# Patient Record
Sex: Female | Born: 1960 | Race: White | Hispanic: No | Marital: Married | Smoking: Former smoker
Health system: Southern US, Community
[De-identification: ages and names within clinical notes are randomized; demographics above are authoritative.]

## PROBLEM LIST (undated history)

## (undated) DIAGNOSIS — Z87442 Personal history of urinary calculi: Secondary | ICD-10-CM

## (undated) DIAGNOSIS — K829 Disease of gallbladder, unspecified: Secondary | ICD-10-CM

## (undated) DIAGNOSIS — M549 Dorsalgia, unspecified: Secondary | ICD-10-CM

## (undated) DIAGNOSIS — M2550 Pain in unspecified joint: Secondary | ICD-10-CM

## (undated) DIAGNOSIS — F1990 Other psychoactive substance use, unspecified, uncomplicated: Secondary | ICD-10-CM

## (undated) DIAGNOSIS — R079 Chest pain, unspecified: Secondary | ICD-10-CM

## (undated) DIAGNOSIS — F319 Bipolar disorder, unspecified: Secondary | ICD-10-CM

## (undated) DIAGNOSIS — E119 Type 2 diabetes mellitus without complications: Secondary | ICD-10-CM

## (undated) DIAGNOSIS — N951 Menopausal and female climacteric states: Secondary | ICD-10-CM

## (undated) DIAGNOSIS — E1142 Type 2 diabetes mellitus with diabetic polyneuropathy: Secondary | ICD-10-CM

## (undated) DIAGNOSIS — E785 Hyperlipidemia, unspecified: Secondary | ICD-10-CM

## (undated) DIAGNOSIS — K760 Fatty (change of) liver, not elsewhere classified: Secondary | ICD-10-CM

## (undated) DIAGNOSIS — J45909 Unspecified asthma, uncomplicated: Secondary | ICD-10-CM

## (undated) DIAGNOSIS — F32A Depression, unspecified: Secondary | ICD-10-CM

## (undated) DIAGNOSIS — G629 Polyneuropathy, unspecified: Secondary | ICD-10-CM

## (undated) DIAGNOSIS — R0600 Dyspnea, unspecified: Secondary | ICD-10-CM

## (undated) DIAGNOSIS — F329 Major depressive disorder, single episode, unspecified: Secondary | ICD-10-CM

## (undated) DIAGNOSIS — N979 Female infertility, unspecified: Secondary | ICD-10-CM

## (undated) DIAGNOSIS — I10 Essential (primary) hypertension: Secondary | ICD-10-CM

## (undated) DIAGNOSIS — T7840XA Allergy, unspecified, initial encounter: Secondary | ICD-10-CM

## (undated) DIAGNOSIS — E282 Polycystic ovarian syndrome: Secondary | ICD-10-CM

## (undated) DIAGNOSIS — J189 Pneumonia, unspecified organism: Secondary | ICD-10-CM

## (undated) DIAGNOSIS — M5430 Sciatica, unspecified side: Secondary | ICD-10-CM

## (undated) DIAGNOSIS — K219 Gastro-esophageal reflux disease without esophagitis: Secondary | ICD-10-CM

## (undated) HISTORY — DX: Sciatica, unspecified side: M54.30

## (undated) HISTORY — DX: Menopausal and female climacteric states: N95.1

## (undated) HISTORY — DX: Dyspnea, unspecified: R06.00

## (undated) HISTORY — DX: Polycystic ovarian syndrome: E28.2

## (undated) HISTORY — DX: Pain in unspecified joint: M25.50

## (undated) HISTORY — DX: Unspecified asthma, uncomplicated: J45.909

## (undated) HISTORY — DX: Dorsalgia, unspecified: M54.9

## (undated) HISTORY — DX: Female infertility, unspecified: N97.9

## (undated) HISTORY — DX: Disease of gallbladder, unspecified: K82.9

## (undated) HISTORY — DX: Essential (primary) hypertension: I10

## (undated) HISTORY — DX: Allergy, unspecified, initial encounter: T78.40XA

## (undated) HISTORY — DX: Bipolar disorder, unspecified: F31.9

## (undated) HISTORY — DX: Chest pain, unspecified: R07.9

## (undated) HISTORY — DX: Polyneuropathy, unspecified: G62.9

## (undated) HISTORY — PX: EYE SURGERY: SHX253

## (undated) HISTORY — PX: WISDOM TOOTH EXTRACTION: SHX21

## (undated) HISTORY — DX: Hyperlipidemia, unspecified: E78.5

## (undated) HISTORY — PX: TONSILLECTOMY: SUR1361

## (undated) HISTORY — DX: Fatty (change of) liver, not elsewhere classified: K76.0

## (undated) HISTORY — DX: Other psychoactive substance use, unspecified, uncomplicated: F19.90

## (undated) HISTORY — DX: Type 2 diabetes mellitus without complications: E11.9

## (undated) HISTORY — DX: Type 2 diabetes mellitus with diabetic polyneuropathy: E11.42

## (undated) HISTORY — PX: DILATION AND CURETTAGE OF UTERUS: SHX78

---

## 1990-09-08 DIAGNOSIS — F419 Anxiety disorder, unspecified: Secondary | ICD-10-CM

## 1990-09-08 HISTORY — DX: Anxiety disorder, unspecified: F41.9

## 1994-09-08 HISTORY — PX: CHOLECYSTECTOMY: SHX55

## 1998-11-16 ENCOUNTER — Other Ambulatory Visit: Admission: RE | Admit: 1998-11-16 | Discharge: 1998-11-16 | Payer: Self-pay | Admitting: Obstetrics and Gynecology

## 1999-12-24 ENCOUNTER — Other Ambulatory Visit: Admission: RE | Admit: 1999-12-24 | Discharge: 1999-12-24 | Payer: Self-pay | Admitting: Obstetrics and Gynecology

## 2000-12-28 ENCOUNTER — Other Ambulatory Visit: Admission: RE | Admit: 2000-12-28 | Discharge: 2000-12-28 | Payer: Self-pay | Admitting: Obstetrics and Gynecology

## 2004-03-30 ENCOUNTER — Emergency Department (HOSPITAL_COMMUNITY): Admission: EM | Admit: 2004-03-30 | Discharge: 2004-03-31 | Payer: Self-pay | Admitting: Emergency Medicine

## 2004-03-31 ENCOUNTER — Inpatient Hospital Stay (HOSPITAL_COMMUNITY): Admission: EM | Admit: 2004-03-31 | Discharge: 2004-04-07 | Payer: Self-pay | Admitting: *Deleted

## 2004-05-12 ENCOUNTER — Emergency Department (HOSPITAL_COMMUNITY): Admission: EM | Admit: 2004-05-12 | Discharge: 2004-05-12 | Payer: Self-pay | Admitting: Emergency Medicine

## 2005-01-24 ENCOUNTER — Ambulatory Visit: Payer: Self-pay | Admitting: Internal Medicine

## 2005-02-05 ENCOUNTER — Ambulatory Visit: Payer: Self-pay | Admitting: Internal Medicine

## 2005-02-19 ENCOUNTER — Ambulatory Visit: Payer: Self-pay | Admitting: Hematology & Oncology

## 2005-05-28 ENCOUNTER — Ambulatory Visit (HOSPITAL_COMMUNITY): Admission: RE | Admit: 2005-05-28 | Discharge: 2005-05-28 | Payer: Self-pay | Admitting: Emergency Medicine

## 2005-09-08 DIAGNOSIS — M1990 Unspecified osteoarthritis, unspecified site: Secondary | ICD-10-CM

## 2005-09-08 HISTORY — DX: Unspecified osteoarthritis, unspecified site: M19.90

## 2006-09-08 HISTORY — PX: KNEE ARTHROSCOPY: SUR90

## 2009-01-17 ENCOUNTER — Observation Stay (HOSPITAL_COMMUNITY): Admission: AD | Admit: 2009-01-17 | Discharge: 2009-01-18 | Payer: Self-pay | Admitting: Orthopedic Surgery

## 2009-01-17 ENCOUNTER — Emergency Department (HOSPITAL_COMMUNITY): Admission: EM | Admit: 2009-01-17 | Discharge: 2009-01-17 | Payer: Self-pay | Admitting: Emergency Medicine

## 2009-01-18 ENCOUNTER — Emergency Department (HOSPITAL_COMMUNITY): Admission: EM | Admit: 2009-01-18 | Discharge: 2009-01-19 | Payer: Self-pay | Admitting: Emergency Medicine

## 2009-01-20 ENCOUNTER — Encounter: Admission: RE | Admit: 2009-01-20 | Discharge: 2009-01-20 | Payer: Self-pay | Admitting: Orthopedic Surgery

## 2010-04-01 ENCOUNTER — Encounter: Admission: RE | Admit: 2010-04-01 | Discharge: 2010-04-01 | Payer: Self-pay | Admitting: Family Medicine

## 2010-09-29 ENCOUNTER — Encounter: Payer: Self-pay | Admitting: Internal Medicine

## 2010-12-17 LAB — BASIC METABOLIC PANEL
BUN: 11 mg/dL (ref 6–23)
CO2: 27 mEq/L (ref 19–32)
Calcium: 8.6 mg/dL (ref 8.4–10.5)
Chloride: 106 mEq/L (ref 96–112)
Chloride: 106 mEq/L (ref 96–112)
Creatinine, Ser: 0.76 mg/dL (ref 0.4–1.2)
GFR calc Af Amer: >60 mL/min (ref >60)
GFR calc non Af Amer: >60 mL/min (ref >60)
GFR calc non Af Amer: >60 mL/min (ref >60)
Glucose, Bld: 169 mg/dL — ABNORMAL HIGH (ref 70–99)
Potassium: 3.7 mEq/L (ref 3.5–5.1)
Potassium: 3.9 mEq/L (ref 3.5–5.1)
Potassium: 4.2 mEq/L (ref 3.5–5.1)
Sodium: 142 mEq/L (ref 135–145)

## 2010-12-17 LAB — DIFFERENTIAL
Basophils Relative: 1 % (ref 0–1)
Basophils Relative: 1 % (ref 0–1)
Lymphocytes Relative: 18 % (ref 12–46)
Lymphs Abs: 2 10*3/uL (ref 0.7–4.0)
Monocytes Absolute: 0.6 10*3/uL (ref 0.1–1.0)
Monocytes Absolute: 0.7 10*3/uL (ref 0.1–1.0)
Monocytes Relative: 5 % (ref 3–12)
Monocytes Relative: 6 % (ref 3–12)
Neutro Abs: 8.5 10*3/uL — ABNORMAL HIGH (ref 1.7–7.7)
Neutro Abs: 9.2 10*3/uL — ABNORMAL HIGH (ref 1.7–7.7)
Neutrophils Relative %: 74 % (ref 43–77)

## 2010-12-17 LAB — GLUCOSE, CAPILLARY

## 2010-12-17 LAB — CBC
HCT: 34.8 % — ABNORMAL LOW (ref 36.0–46.0)
HCT: 37.5 % (ref 36.0–46.0)
Hemoglobin: 12 g/dL (ref 12.0–15.0)
Hemoglobin: 13 g/dL (ref 12.0–15.0)
MCHC: 34.8 g/dL (ref 30.0–36.0)
MCV: 85 fL (ref 78.0–100.0)
MCV: 85.9 fL (ref 78.0–100.0)
Platelets: 293 10*3/uL (ref 150–400)
RBC: 4.18 MIL/uL (ref 3.87–5.11)
RBC: 4.41 MIL/uL (ref 3.87–5.11)
RDW: 14.4 % (ref 11.5–15.5)
WBC: 11.4 10*3/uL — ABNORMAL HIGH (ref 4.0–10.5)
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
## 2011-01-24 NOTE — Discharge Summary (Signed)
NAMEJAMAYIA, Alicia Lucero NO.:  [id number]   MEDICAL RECORD NO.:  [id number]          PATIENT TYPE:  INP   LOCATION:  3016                         FACILITY:  [facility]   PHYSICIAN:  Eulas Post, MD    DATE OF BIRTH:  [birth year]   DATE OF ADMISSION:  01/17/2009  DATE OF DISCHARGE:  01/18/2009                               DISCHARGE SUMMARY   ADMISSION DIAGNOSES:  1. Right knee pain.  2. Right prepatellar laceration.   DISCHARGE DIAGNOSES:  1. Right knee pain.  2. Right prepatellar laceration.   HOSPITAL COURSE:  Ms. Alicia Lucero is a morbidly obese, diabetic woman  who fell directly onto her right knee.  She presented to the office, and  was in tears, desperate that she was failing to thrive at home and was  unable to care for herself.  She had severe knee pain and was unable to  ambulate.  She was begging for hospital admission in order to help allow  her to get to the bathroom and back, and assist with physical therapy  and IV pain control.  She was brought into the hospital and given IV  pain medicine as well as physical therapy and improved.  She worked with  physical therapy and was able to ambulate at the time of discharge.  She  is planning to be discharged home with follow-up with me in the office.  She benefited maximally from hospital stay and there were no  complications.      Eulas Post, MD  Electronically Signed     JPL/MEDQ  D:  03/07/2009  T:  03/08/2009  Job:  [id number]
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
## 2011-05-29 ENCOUNTER — Other Ambulatory Visit: Payer: Self-pay | Admitting: Family Medicine

## 2011-05-29 DIAGNOSIS — R270 Ataxia, unspecified: Secondary | ICD-10-CM

## 2011-05-29 DIAGNOSIS — R410 Disorientation, unspecified: Secondary | ICD-10-CM

## 2011-06-05 ENCOUNTER — Ambulatory Visit
Admission: RE | Admit: 2011-06-05 | Discharge: 2011-06-05 | Disposition: A | Discharge status: Home / Self Care | Payer: 59 | Source: Ambulatory Visit | Attending: Family Medicine | Admitting: Family Medicine

## 2011-06-05 DIAGNOSIS — R410 Disorientation, unspecified: Secondary | ICD-10-CM

## 2011-06-05 DIAGNOSIS — R270 Ataxia, unspecified: Secondary | ICD-10-CM

## 2011-06-10 ENCOUNTER — Ambulatory Visit: Payer: 59 | Admitting: *Deleted

## 2011-06-12 ENCOUNTER — Encounter: Payer: 59 | Attending: Family Medicine | Admitting: *Deleted

## 2011-06-12 DIAGNOSIS — E119 Type 2 diabetes mellitus without complications: Secondary | ICD-10-CM | POA: Insufficient documentation

## 2011-06-12 DIAGNOSIS — Z713 Dietary counseling and surveillance: Secondary | ICD-10-CM | POA: Insufficient documentation

## 2011-06-13 NOTE — Progress Notes (Signed)
  Medical Nutrition Therapy:  Appt start time: 1200 end time:  1300.   Assessment:  Primary concerns today: Diabetes self management and nutrition counseling for weight loss. Patient states she has been taught Carb Counting in past but is not currently following a meal plan, has a meter to check blood glucose which she currently does 3 times/day and is taking 30 units of Levemir in the evening in addition to Janumet. She states her BG averages in the low 200's and she is interested in reaching an initial goal of 150, then down to 120 as able. She has no medical method to correct high BG at this time.   MEDICATIONS: see list   DIETARY INTAKE:  Usual eating pattern includes 3 meals and 0 snacks per day.  Everyday foods include very limited food intake including lean meats, vegetables and protein bars.  Avoided foods include alcohol.    24-hr recall:  B ( AM): Balance Bar  Snk ( AM): none  L ( PM): Balance bar Snk ( PM): none D ( PM): lean meat, vegetables, occasional starch Snk ( PM): none Beverages: low sodium V-8 Juice, water  Usual physical activity: physical therapy 3 times per week for 1 hour  Estimated energy needs: 1400 calories 158 g carbohydrates 105 g protein 39 g fat  Progress Towards Goal(s):  In progress.   Nutritional Diagnosis:  NI-1.5 Excessive energy intake As related to uncontrolled diabetes.  As evidenced by HbA1c of 10%.    Intervention:  Nutrition counseling regarding consistent carb meal plan, averaging 3 Carb Choices per meal. Also described opportunity of adding analog/bolus insulin at meal time with a Carb Ratio and Sensitivity factor so she could adjust bolus insulin based on actual food intake and BG reading. This can prevent hypoglycemia as well as weight gain. In addition, it would give her an opportunity to correct high blood glucose readings and thus lower her A1c effectively. I have asked her to relay this to her MD for consideration and if approved,  let me know if he would prefer to adjust the bolus insulin or give me permission to do so while reporting her progress back to him regularly.  Handouts given during visit include:  Living Well with Diabetes  Carb Counting and Beyond  Monitoring/Evaluation:  Dietary intake of 1400 calories / day with consistent carb intake, exercise, continued self monitoring of BG, and body weight in 4 week(s).

## 2011-06-19 ENCOUNTER — Telehealth: Payer: Self-pay | Admitting: *Deleted

## 2011-07-01 ENCOUNTER — Encounter: Payer: Self-pay | Admitting: *Deleted

## 2011-07-01 ENCOUNTER — Encounter: Payer: 59 | Admitting: *Deleted

## 2011-07-01 NOTE — Progress Notes (Signed)
Introduction to Intensive Insulin Therapy:  Appt start time: 0930 end time:  1000.  Assessment:  This patient has DM 2 and her primary concerns today: are to start on intensive insulin therapy.  She has obtained Rx for Novolog Pen and understands rationale for taking this insulin for Carb intake and to correct high BGs. Order received from Dr. Milus Glazier to start intensive insulin therapy and for me to make adjustments to doses in order to achieve BG goals.  Current Insulin Dose: Basal Insulin: 30 units of Levemir at night via pen  Total of insulin doses per day 30 units Other diabetes medications: Janumet   Patient states they are testing 1-2 times per day Patient does not currently have Ketone Strips  Patient states knowledge of Carb Counting is good  Usual physical activity: physical therapy 3 times per week. Interested in swimming at the [facility]  Last A1c was 10% Patient states they have not forgotten to take their insulin injection any times per week Patient states they have had hypoglycemia no times in the past month Patient states their biggest barrier with diabetes is weight gain when on fixed doses of insulin in the past  Patient currently is on dis-ability and the schedule is flexible. She is planning to go back to work next Monday  Progress Towards Intensive Insulin Therapy goal(s):  In progress.  Patient states her expectations of intensive insulin therapy include: better BG management along with weight loss Patient expresses understanding that for improved outcomes for their diabetes on intensive insulin therapy they will:  Check BG 3 times per day pre meals.  Reduced Levemir from 30 to 20 units at night  Use Carb Ratio of 1 unit Novolog / 5 grams Carbohydrate at meals, none for snacks at this point  Use Correction Factor of 1 unit Novolog for every 25 mg/dl she is above target of 150 at pre-meal BG check  May adjust Novo log dose down by 2 units if planned increase in  activity before next meal.  Record BG, Carbs and insulin dose on Log Sheet provided today and fax or e-mail to me weekly.      Intervention:  Reviewed and patient expressed verbal understanding of plan for intensive insulin therapy above.   Handouts given during visit include:  Log sheet to record BG, food and insulin doses  Monitoring/Evaluation:   Log sheet to be emailed or faxed to me in one week.

## 2011-07-24 ENCOUNTER — Encounter: Payer: 59 | Attending: Family Medicine | Admitting: *Deleted

## 2011-07-24 DIAGNOSIS — Z713 Dietary counseling and surveillance: Secondary | ICD-10-CM | POA: Insufficient documentation

## 2011-07-24 DIAGNOSIS — E119 Type 2 diabetes mellitus without complications: Secondary | ICD-10-CM | POA: Insufficient documentation

## 2011-07-24 NOTE — Patient Instructions (Addendum)
  Continue with current intensive insulin regime of  Basal insulin 35 units Levemir at night and  Bolus Novolog @ 1 unit/every 10 grams Carbohydrate PLUS 1 unit for 3 every 35 mg/dl above target of 096.  New home rule, no eating out of the container, put food in dish or on paper towel for snacks  Measure out 3 c popcorn into favorite bowl to see perspective of 1 Carb Choice  Consider using 1/2 c measure as serving utensil for meals at home  Activity plan: work on swimming at [facility] in mornings or evenings, weekends  (may need to reduce Levemir the night before?)  Test BG pre and post swimming to get that data

## 2011-07-25 ENCOUNTER — Encounter: Payer: Self-pay | Admitting: *Deleted

## 2011-07-25 NOTE — Progress Notes (Signed)
Patient seen for follow up assessment of Intensive Insulin Regime and weight loss management.  Current insulin dose: Basal insulin 35 units Levemir at night and  Bolus Novolog @ 1 unit/every 10 grams Carbohydrate PLUS 1 unit for 3 every 35 mg/dl above target of 562  Medications: Current weight 339 lbs indicates weight gain of 7 pounds in past month  BG:  Averages are down to 14 day average of 160 mg/dl on current insulin regime and more consistent carb plan  Assessment:  Patient states she is happy with the ability to adjust insulin based on her actual food intake and to be able to correct any high BGs now. She is comfortable with math involved in making these bolus decisions and is doing quite well. She states she has been eating more the last week. She states she doesn't feel like eating during the day and then overeats in the evenings with uncontrolled portion sizes. Her activity level is based on appointments with her physical therapist which was just one appointment this week. She is interested in swimming the the [facility] as a way to increase her activity level,  just needs a plan to implement with her work schedule.  Plan: Continue with current intensive insulin regime of  Basal insulin 35 units Levemir at night and  Bolus Novolog @ 1 unit/every 10 grams Carbohydrate PLUS 1 unit for 3 every 35 mg/dl above target of 130.   New home rule; no eating out of the container, put food in dish or on paper towel for snacks  Measure out 3 c popcorn into favorite bowl to see perspective of 1 Carb Choice  Consider using 1/2 c measure as serving utensil for meals at home  Activity plan: work on swimming at [facility] in mornings or evenings, weekends  (may need to reduce Levemir the night before?)  Test BG pre and post swimming to get that data  Follow Up: Appointment scheduled for 2 weeks. Patient to communicate  exercise success and BG Logs via phone or e-mail in the meantime.

## 2011-08-07 ENCOUNTER — Ambulatory Visit: Payer: 59 | Admitting: *Deleted

## 2011-08-14 ENCOUNTER — Ambulatory Visit: Payer: 59 | Admitting: *Deleted

## 2011-08-21 ENCOUNTER — Ambulatory Visit: Payer: 59 | Admitting: *Deleted

## 2011-08-21 ENCOUNTER — Ambulatory Visit (INDEPENDENT_AMBULATORY_CARE_PROVIDER_SITE_OTHER): Payer: 59 | Admitting: Family Medicine

## 2011-08-21 DIAGNOSIS — R21 Rash and other nonspecific skin eruption: Secondary | ICD-10-CM

## 2011-08-21 DIAGNOSIS — E109 Type 1 diabetes mellitus without complications: Secondary | ICD-10-CM

## 2011-12-16 ENCOUNTER — Other Ambulatory Visit: Payer: Self-pay | Admitting: Family Medicine

## 2011-12-17 ENCOUNTER — Telehealth: Payer: Self-pay

## 2011-12-17 NOTE — Telephone Encounter (Signed)
[facility] pharmacist called bc we had OKd RF of pt's insulin yesterday, but pt states the amount was increased to 36-40 units. Checked pts chart and per Dr Cain Saupe notes, and Sarah's OK, called pharmacist back and changed prescription to 40 units QD, quant suf for 1 mos. Pharm agreed and will put note on Rx that pt needs OV for more

## 2012-01-29 ENCOUNTER — Ambulatory Visit (INDEPENDENT_AMBULATORY_CARE_PROVIDER_SITE_OTHER): Payer: 59 | Admitting: Family Medicine

## 2012-01-29 ENCOUNTER — Encounter: Payer: Self-pay | Admitting: Family Medicine

## 2012-01-29 VITALS — BP 115/74 | HR 69 | Temp 98.3°F | Resp 16 | Ht 64.0 in | Wt 341.6 lb

## 2012-01-29 DIAGNOSIS — F329 Major depressive disorder, single episode, unspecified: Secondary | ICD-10-CM

## 2012-01-29 DIAGNOSIS — M5126 Other intervertebral disc displacement, lumbar region: Secondary | ICD-10-CM

## 2012-01-29 DIAGNOSIS — M5432 Sciatica, left side: Secondary | ICD-10-CM

## 2012-01-29 DIAGNOSIS — F32A Depression, unspecified: Secondary | ICD-10-CM | POA: Insufficient documentation

## 2012-01-29 DIAGNOSIS — E109 Type 1 diabetes mellitus without complications: Secondary | ICD-10-CM

## 2012-01-29 LAB — LIPID PANEL
Cholesterol: 169 mg/dL (ref 0–200)
HDL: 49 mg/dL (ref >39)
LDL Cholesterol: 93 mg/dL (ref 0–99)
Total CHOL/HDL Ratio: 3.4 Ratio
Triglycerides: 136 mg/dL (ref <150)
VLDL: 27 mg/dL (ref 0–40)

## 2012-01-29 LAB — COMPREHENSIVE METABOLIC PANEL
ALT: 20 U/L (ref 0–35)
AST: 22 U/L (ref 0–37)
Albumin: 4.4 g/dL (ref 3.5–5.2)
Alkaline Phosphatase: 76 U/L (ref 39–117)
BUN: 18 mg/dL (ref 6–23)
CO2: 23 mEq/L (ref 19–32)
Calcium: 9.8 mg/dL (ref 8.4–10.5)
Chloride: 105 mEq/L (ref 96–112)
Creat: 0.88 mg/dL (ref 0.50–1.10)
Glucose, Bld: 132 mg/dL — ABNORMAL HIGH (ref 70–99)
Potassium: 4.5 mEq/L (ref 3.5–5.3)
Sodium: 140 mEq/L (ref 135–145)
Total Bilirubin: 0.4 mg/dL (ref 0.3–1.2)
Total Protein: 6.9 g/dL (ref 6.0–8.3)

## 2012-01-29 LAB — POCT GLYCOSYLATED HEMOGLOBIN (HGB A1C): Hemoglobin A1C: 6.8

## 2012-01-29 LAB — GLUCOSE, POCT (MANUAL RESULT ENTRY): POC Glucose: 145 mg/dl — AB (ref 70–99)

## 2012-01-29 MED ORDER — MELOXICAM 15 MG PO TABS: 15.0000 mg | ORAL_TABLET | Freq: Every day | ORAL | Status: DC

## 2012-01-29 NOTE — Progress Notes (Signed)
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 She comes in for followup on her diabetes, morbid obesity, and depression.  Regarding her diabetes: Patient had trouble with her diet, became embarrassed about her weight, and gained much of her weight back. She peaked out at 350 pounds. The last month, patient has started exercising again, started weight watchers, and lost 7 pounds. She is now walking daily with her [facility]. She has support from [employer] with 2 of her fellow employees and she intends to stick with this now.  Regarding her depression: Patient has a new therapist. She stopped Abilify. She's now taking Lamictal and gabapentin and this seems to be stabilizing her much more thoroughly.  Regarding the obesity: Patient has had several weeks of left buttock pain radiating down the posterior part of her left leg. She's tried Aleve and ibuprofen without much benefit. The pain is worse when she's sitting down intensive away when she's standing. It's not adversely affecting her walking or exercise routine. She has no numbness or weakness in the legs. She has no history of disc disease.  Objective: Spent 45 minutes talking about weight  Foot exam shows normal filament testing and there are no lesions on her feet.  Straight-leg raising is negative, reflexes are symmetrical and normal, patient has a normal gait.  Heart is regular, chest is clear, skin is clear  Patient seems much more cheerful than I have seen her many years.  Assessment: Patient has made improvements in recent weeks although she has a long ways to go.  Plan: I refilled her Levemir which she is taking at 38 units every night. I suggested that she drop this by 2 units for every 10 pound she loses. Unless you back in 3 months.  We're checking her diabetes profile today  She's going to be continuing her exercise program and Weight Watchers.  I prescribed  meloxicam for her back pain.  Results for orders placed in visit on 01/29/12  POCT GLYCOSYLATED HEMOGLOBIN (HGB A1C)      Component Value Range   Hemoglobin A1C 6.8    GLUCOSE, POCT (MANUAL RESULT ENTRY)      Component Value Range   POC Glucose 145 (*) 70 - 99 (mg/dl)   1. Type 1 diabetes mellitus  POCT glycosylated hemoglobin (Hb A1C), POCT glucose (manual entry), Comprehensive metabolic panel, Lipid panel, Microalbumin, urine  2. Morbid obesity with BMI of 70 and over, adult    3. Depression    4. Sciatica neuralgia, left  meloxicam (MOBIC) 15 MG tablet

## 2012-01-30 ENCOUNTER — Encounter: Payer: Self-pay | Admitting: Family Medicine

## 2012-01-30 LAB — MICROALBUMIN, URINE: Microalb, Ur: 0.78 mg/dL (ref 0.00–1.89)

## 2012-02-06 ENCOUNTER — Telehealth: Payer: Self-pay

## 2012-02-06 ENCOUNTER — Other Ambulatory Visit: Payer: Self-pay | Admitting: Family Medicine

## 2012-02-06 DIAGNOSIS — M5432 Sciatica, left side: Secondary | ICD-10-CM

## 2012-02-06 DIAGNOSIS — M549 Dorsalgia, unspecified: Secondary | ICD-10-CM

## 2012-02-06 MED ORDER — METHYLPREDNISOLONE 4 MG PO TABS: ORAL_TABLET | ORAL | Status: DC

## 2012-02-06 NOTE — Telephone Encounter (Signed)
PT STATES SHE AND DR KURT HAD DISCUSSED IF SHE WANTED THE PREDISONE AND SHE DOES WANT IT PLEASE CALL [phone]   [facility] [address]

## 2012-02-26 ENCOUNTER — Telehealth: Payer: Self-pay

## 2012-02-26 NOTE — Telephone Encounter (Signed)
[facility] called requesting more records.  They need the patient's most recent PE, Labs, and medicine list.  Her doctor is trying to get her a CT scan prior to surgery.  They also need clearance for the surgery from her doctor.

## 2012-02-27 NOTE — Telephone Encounter (Signed)
Faxed over most recent OV and Labs via Epic, and I will fax over from patient's paper chart the most recent CPE.

## 2012-03-01 ENCOUNTER — Other Ambulatory Visit: Payer: Self-pay | Admitting: Obstetrics and Gynecology

## 2012-03-01 DIAGNOSIS — R102 Pelvic and perineal pain: Secondary | ICD-10-CM

## 2012-03-03 ENCOUNTER — Ambulatory Visit
Admission: RE | Admit: 2012-03-03 | Discharge: 2012-03-03 | Disposition: A | Discharge status: Home / Self Care | Payer: 59 | Source: Ambulatory Visit | Attending: Obstetrics and Gynecology | Admitting: Obstetrics and Gynecology

## 2012-03-03 ENCOUNTER — Other Ambulatory Visit: Payer: 59

## 2012-03-03 DIAGNOSIS — R102 Pelvic and perineal pain: Secondary | ICD-10-CM

## 2012-03-03 MED ORDER — IOHEXOL 300 MG/ML  SOLN
125.0000 mL | Freq: Once | INTRAMUSCULAR | Status: AC | PRN
  Administered 2012-03-03: 125 mL via INTRAVENOUS

## 2012-03-07 ENCOUNTER — Other Ambulatory Visit: Payer: Self-pay | Admitting: Family Medicine

## 2012-03-18 ENCOUNTER — Encounter: Payer: 59 | Admitting: Family Medicine

## 2012-05-06 ENCOUNTER — Other Ambulatory Visit: Payer: Self-pay | Admitting: Physician Assistant

## 2012-05-06 ENCOUNTER — Ambulatory Visit: Payer: 59 | Admitting: Family Medicine

## 2012-05-06 NOTE — Telephone Encounter (Signed)
Then needs office visit 

## 2012-06-04 ENCOUNTER — Other Ambulatory Visit: Payer: Self-pay | Admitting: Physician Assistant

## 2012-06-07 ENCOUNTER — Other Ambulatory Visit: Payer: Self-pay | Admitting: Family Medicine

## 2012-06-07 DIAGNOSIS — Z1231 Encounter for screening mammogram for malignant neoplasm of breast: Secondary | ICD-10-CM

## 2012-06-15 ENCOUNTER — Ambulatory Visit
Admission: RE | Admit: 2012-06-15 | Discharge: 2012-06-15 | Disposition: A | Discharge status: Home / Self Care | Payer: 59 | Source: Ambulatory Visit | Attending: Family Medicine | Admitting: Family Medicine

## 2012-06-15 DIAGNOSIS — Z1231 Encounter for screening mammogram for malignant neoplasm of breast: Secondary | ICD-10-CM

## 2012-06-17 ENCOUNTER — Ambulatory Visit (INDEPENDENT_AMBULATORY_CARE_PROVIDER_SITE_OTHER): Payer: 59 | Admitting: Family Medicine

## 2012-06-17 ENCOUNTER — Encounter: Payer: Self-pay | Admitting: Family Medicine

## 2012-06-17 VITALS — BP 130/78 | HR 68 | Temp 98.2°F | Resp 16 | Ht 64.0 in | Wt 342.0 lb

## 2012-06-17 DIAGNOSIS — J3489 Other specified disorders of nose and nasal sinuses: Secondary | ICD-10-CM

## 2012-06-17 DIAGNOSIS — E1165 Type 2 diabetes mellitus with hyperglycemia: Secondary | ICD-10-CM

## 2012-06-17 DIAGNOSIS — IMO0001 Reserved for inherently not codable concepts without codable children: Secondary | ICD-10-CM

## 2012-06-17 MED ORDER — MUPIROCIN 2 % EX OINT: TOPICAL_OINTMENT | Freq: Three times a day (TID) | CUTANEOUS | Status: DC

## 2012-06-17 MED ORDER — SITAGLIPTIN PHOS-METFORMIN HCL 50-1000 MG PO TABS: 1.0000 | ORAL_TABLET | Freq: Two times a day (BID) | ORAL | Status: DC

## 2012-06-17 NOTE — Progress Notes (Signed)
@[email or web address] @  Patient ID: Alicia Lucero MRN: [id number], DOB: May 16, 1961, 51 y.o. Date of Encounter: 06/17/2012, 8:56 AM  Primary Physician: No primary provider on file.  Chief Complaint: Diabetes follow up  HPI: 51 y.o. year old female with history below presents for follow up of diabetes mellitus. Doing well. No issues or complaints. Taking medications daily without adverse effects. No polydipsia, polyphagia, polyuria, or nocturia.  Blood sugars at home:  Elevated more lately Diet consists of:  Heavy dinner (cannot control self) Exercising regularly until last few months Last A1C: 6.8   Influenza vaccine: last week Pneumococcal vaccine:  2010   No past medical history on file.   Home Meds: Prior to Admission medications   Medication Sig Start Date End Date Taking? Authorizing Provider  clonazePAM (KLONOPIN) 2 MG tablet Take 2 mg by mouth 3 (three) times daily as needed.     Yes Historical Provider, MD  fish oil-omega-3 fatty acids 1000 MG capsule Take 1 g by mouth 4 (four) times daily.     Yes Historical Provider, MD  gabapentin (NEURONTIN) 100 MG capsule Take 100 mg by mouth 3 (three) times daily.   Yes Historical Provider, MD  insulin aspart (NOVOLOG) 100 UNIT/ML injection Inject 1 Units into the skin 3 (three) times daily before meals. NEED REFILLS   Yes Historical Provider, MD  insulin detemir (LEVEMIR FLEXPEN) 100 UNIT/ML injection  12/16/11  Yes Sarah Harvie Bridge, PA-C  lamoTRIgine (LAMICTAL) 200 MG tablet Take 200 mg by mouth daily.   Yes Historical Provider, MD  Multiple Vitamins-Minerals (WOMENS MULTIVITAMIN PLUS PO) Take by mouth daily.     Yes Historical Provider, MD  omeprazole (PRILOSEC) 40 MG capsule take 1 capsule by mouth once daily 02/06/12  Yes Ryan M Dunn, PA-C  OVER THE COUNTER MEDICATION Calcium po   Yes Historical Provider, MD  OVER THE COUNTER MEDICATION B complex po   Yes Historical Provider, MD  OVER THE COUNTER MEDICATION Osteo B-iflex po   Yes Historical Provider,  MD  sertraline (ZOLOFT) 100 MG tablet Take 200 mg by mouth daily.     Yes Historical Provider, MD  sitaGLIPtan-metformin (JANUMET) 50-1000 MG per tablet  06/04/12  Yes Sarah Harvie Bridge, PA-C  ARIPiprazole (ABILIFY) 2 MG tablet Take 5 mg by mouth daily.     Historical Provider, MD  L-Methylfolate (DEPLIN PO) Take by mouth daily.      Historical Provider, MD  meloxicam (MOBIC) 15 MG tablet Take 1 tablet (15 mg total) by mouth daily. 01/29/12 01/28/13  [name], MD  methylPREDNISolone (MEDROL) 4 MG tablet Two daily with food 02/06/12   [name], MD    Allergies:  Allergies  Allergen Reactions  . Other Nausea Only    States she can't tolerate seafood smell    History   Social History  . Marital Status: Married    Spouse Name: N/A    Number of Children: N/A  . Years of Education: N/A   Occupational History  . Not on file.   Social History Main Topics  . Smoking status: Former Smoker    Types: Cigarettes    Quit date: 06/30/2005  . Smokeless tobacco: Not on file   Comment: electronic cigarettes PRN  . Alcohol Use: No  . Drug Use: No  . Sexually Active: Not on file   Other Topics Concern  . Not on file   Social History Narrative  . No narrative on file     Review of Systems: Constitutional: negative for chills,  fever, night sweats, weight changes, or fatigue  HEENT: negative for vision changes, hearing loss, congestion, rhinorrhea, or epistaxis Cardiovascular: negative for chest pain, palpitations, diaphoresis, DOE, orthopnea, or edema Respiratory: negative for hemoptysis, wheezing, shortness of breath, dyspnea, or cough Abdominal: negative for abdominal pain, nausea, vomiting, diarrhea, or constipation Dermatological: negative for rash, erythema, or wounds Neurologic: negative for headache, dizziness, or syncope Renal:  Negative for polyuria, polydipsia, or dysuria All other systems reviewed and are otherwise negative with the exception to those above and in the  HPI.   Physical Exam: Blood pressure 130/78, pulse 68, temperature 98.2 F (36.8 C), temperature source Oral, resp. rate 16, height 5\' 4"  (1.626 m), weight 342 lb (155.13 kg), last menstrual period 05/23/2012, SpO2 96.00%., Body mass index is 58.70 kg/(m^2). General: Well developed, well nourished, in no acute distress. Head: Normocephalic, atraumatic, eyes without discharge, sclera non-icteric, nares are without discharge. Bilateral auditory canals clear, TM's are without perforation, pearly grey and translucent with reflective cone of light bilaterally. Oral cavity moist, posterior pharynx without exudate, erythema, peritonsillar abscess, or post nasal drip.  Neck: Supple. No thyromegaly. Full ROM. No lymphadenopathy. Lungs: Clear bilaterally to auscultation without wheezes, rales, or rhonchi. Breathing is unlabored. Heart: RRR with S1 S2. No murmurs, rubs, or gallops appreciated. Abdomen: Soft, non-tender, non-distended with normoactive bowel sounds. No hepatosplenomegaly. No rebound/guarding. No obvious abdominal masses. Msk:  Strength and tone normal for age. Extremities/Skin: Warm and dry. No clubbing or cyanosis. No edema. No rashes, wounds, or suspicious lesions. Monofilament exam unremarkable bilaterally.  Neuro: Alert and oriented X 3. Moves all extremities spontaneously. Gait is normal. CNII-XII grossly in tact. Psych:  Responds to questions appropriately with a normal affect.     ASSESSMENT AND PLAN:  51 y.o. year old female with type 2 dm, uncontrolled Spent 40 minutes discussing weight, exercise, how to think about dm -  Signed, [name], MD 06/17/2012 8:56 AM

## 2012-06-18 LAB — MICROALBUMIN, URINE: Microalb, Ur: 0.5 mg/dL (ref 0.00–1.89)

## 2012-06-28 ENCOUNTER — Ambulatory Visit (HOSPITAL_COMMUNITY): Admission: RE | Admit: 2012-06-28 | Payer: 59 | Source: Ambulatory Visit | Admitting: Gastroenterology

## 2012-06-28 ENCOUNTER — Encounter (HOSPITAL_COMMUNITY): Admission: RE | Payer: Self-pay | Source: Ambulatory Visit

## 2012-06-28 SURGERY — COLONOSCOPY
Anesthesia: Moderate Sedation

## 2012-07-29 ENCOUNTER — Ambulatory Visit (INDEPENDENT_AMBULATORY_CARE_PROVIDER_SITE_OTHER): Payer: 59 | Admitting: Family Medicine

## 2012-07-29 ENCOUNTER — Encounter: Payer: Self-pay | Admitting: Family Medicine

## 2012-07-29 VITALS — BP 120/76 | HR 88 | Temp 98.2°F | Resp 16 | Ht 63.75 in | Wt 345.0 lb

## 2012-07-29 DIAGNOSIS — E119 Type 2 diabetes mellitus without complications: Secondary | ICD-10-CM

## 2012-07-29 DIAGNOSIS — E1065 Type 1 diabetes mellitus with hyperglycemia: Secondary | ICD-10-CM

## 2012-07-29 MED ORDER — CYANOCOBALAMIN 1000 MCG/ML IJ SOLN
1000.0000 ug | Freq: Once | INTRAMUSCULAR | Status: AC
  Administered 2012-07-29: 1000 ug via INTRAMUSCULAR

## 2012-07-29 NOTE — Progress Notes (Signed)
51 yo with type 1 diabetes who has lately been tired and uninspired.  She went to the beach recently and binge ate, and over ate last night.  Not exercising.    She goes to [facility] counseling, sees Saul Fordyce, but is not scheduled to return until January.  Sleeping well.  Applying for other positions at work  Objective:  Alert, friendly as always Her weight has shot up.  Fundi: benign Extrem:  No edema Chest: clear Heart:  Reg, no murmur  Assessment: impulse control continues to be a problem.  She has seen the nutritionist.  I am at a loss of what to suggest.  The eating disorder is at the root of her diabetes, and she doesn't see her therapist for another 2 months.  Plan:  Weekly weigh[facility]in[facility], no change in meds I really feel this binge eating is psychologically motivated

## 2012-12-06 ENCOUNTER — Encounter: Payer: Self-pay | Admitting: Family Medicine

## 2012-12-06 ENCOUNTER — Ambulatory Visit (INDEPENDENT_AMBULATORY_CARE_PROVIDER_SITE_OTHER): Payer: 59 | Admitting: Family Medicine

## 2012-12-06 VITALS — BP 132/77 | HR 79 | Temp 98.5°F | Resp 16 | Ht 64.0 in | Wt 342.0 lb

## 2012-12-06 DIAGNOSIS — B356 Tinea cruris: Secondary | ICD-10-CM

## 2012-12-06 DIAGNOSIS — F32A Depression, unspecified: Secondary | ICD-10-CM

## 2012-12-06 DIAGNOSIS — E1149 Type 2 diabetes mellitus with other diabetic neurological complication: Secondary | ICD-10-CM

## 2012-12-06 DIAGNOSIS — F329 Major depressive disorder, single episode, unspecified: Secondary | ICD-10-CM

## 2012-12-06 MED ORDER — KETOCONAZOLE 200 MG PO TABS: 200.0000 mg | ORAL_TABLET | Freq: Every day | ORAL | Status: DC

## 2012-12-06 NOTE — Progress Notes (Signed)
52 yo woman who works at [employer] with ongoing morbid obesity, diabetes.  Recently, a close acquaintance died which has been troubling her.  This was an alcoholic who she discovered just before he died.  Her job has been very stressful.  When fellow employee left, the training was flawed.  The work environment is not supportive.  Bud is doing well with his publishing.  She would like to be promoting his work.  She changed her therapist, and has seen the new therapist only once.  Still seeing Saul Fordyce for meds.  Blood sugars:  150 - 300 Levimir 56 units Taking Janumet.  Tingling left great toe. Left tinea cruris with erythema and some weeping despite cornstarch   Objective:  Good eye contact.  Clearly unhappy with her work. I spent 40 minutes with patient face to face as she related her predicament with work and weight.  She would like to quit but must consider her budget, insurance status.  Assessment: depressed, gaining weight.  This is a difficult situation because patient is so dependent on her in-laws for housing and her company for health insurance.  Plan:  We need to see some weight loss.  Consideration for leaving [employer].  1. Develop budget over next month 2. Avoid starch 3. Push vegetables, nuts, fruits 4. Recheck one month 5. Go to bed at 10:00 each night to avoid staying up late and eating compulsively

## 2012-12-10 ENCOUNTER — Encounter (HOSPITAL_COMMUNITY): Payer: Self-pay | Admitting: Cardiology

## 2012-12-10 ENCOUNTER — Observation Stay (HOSPITAL_COMMUNITY)
Admission: EM | Admit: 2012-12-10 | Discharge: 2012-12-11 | Disposition: A | Discharge status: Home / Self Care | Payer: 59 | Attending: Family Medicine | Admitting: Family Medicine

## 2012-12-10 ENCOUNTER — Ambulatory Visit (INDEPENDENT_AMBULATORY_CARE_PROVIDER_SITE_OTHER): Payer: 59 | Admitting: Family Medicine

## 2012-12-10 ENCOUNTER — Encounter: Payer: Self-pay | Admitting: Family Medicine

## 2012-12-10 ENCOUNTER — Emergency Department (HOSPITAL_COMMUNITY): Payer: 59

## 2012-12-10 VITALS — BP 124/80 | HR 85 | Temp 97.9°F | Resp 20

## 2012-12-10 DIAGNOSIS — R079 Chest pain, unspecified: Secondary | ICD-10-CM

## 2012-12-10 DIAGNOSIS — E109 Type 1 diabetes mellitus without complications: Secondary | ICD-10-CM

## 2012-12-10 DIAGNOSIS — R0602 Shortness of breath: Secondary | ICD-10-CM | POA: Insufficient documentation

## 2012-12-10 DIAGNOSIS — F329 Major depressive disorder, single episode, unspecified: Secondary | ICD-10-CM

## 2012-12-10 DIAGNOSIS — R300 Dysuria: Secondary | ICD-10-CM | POA: Insufficient documentation

## 2012-12-10 DIAGNOSIS — E119 Type 2 diabetes mellitus without complications: Secondary | ICD-10-CM

## 2012-12-10 DIAGNOSIS — R110 Nausea: Secondary | ICD-10-CM | POA: Insufficient documentation

## 2012-12-10 DIAGNOSIS — F32A Depression, unspecified: Secondary | ICD-10-CM

## 2012-12-10 DIAGNOSIS — Z6841 Body Mass Index (BMI) 40.0 and over, adult: Secondary | ICD-10-CM | POA: Insufficient documentation

## 2012-12-10 DIAGNOSIS — R51 Headache: Secondary | ICD-10-CM | POA: Insufficient documentation

## 2012-12-10 DIAGNOSIS — F3289 Other specified depressive episodes: Secondary | ICD-10-CM | POA: Insufficient documentation

## 2012-12-10 DIAGNOSIS — F410 Panic disorder [episodic paroxysmal anxiety] without agoraphobia: Secondary | ICD-10-CM

## 2012-12-10 DIAGNOSIS — R0609 Other forms of dyspnea: Secondary | ICD-10-CM

## 2012-12-10 DIAGNOSIS — R0600 Dyspnea, unspecified: Secondary | ICD-10-CM

## 2012-12-10 HISTORY — DX: Gastro-esophageal reflux disease without esophagitis: K21.9

## 2012-12-10 HISTORY — DX: Major depressive disorder, single episode, unspecified: F32.9

## 2012-12-10 HISTORY — DX: Depression, unspecified: F32.A

## 2012-12-10 LAB — CBC
HCT: 36.2 % (ref 36.0–46.0)
Hemoglobin: 12.4 g/dL (ref 12.0–15.0)
MCH: 28.1 pg (ref 26.0–34.0)
MCHC: 34.3 g/dL (ref 30.0–36.0)
RDW: 14.8 % (ref 11.5–15.5)

## 2012-12-10 LAB — COMPREHENSIVE METABOLIC PANEL
Albumin: 3.5 g/dL (ref 3.5–5.2)
BUN: 13 mg/dL (ref 6–23)
Calcium: 9 mg/dL (ref 8.4–10.5)
GFR calc Af Amer: >90 mL/min (ref >90)
Glucose, Bld: 136 mg/dL — ABNORMAL HIGH (ref 70–99)
Potassium: 3.9 mEq/L (ref 3.5–5.1)
Sodium: 139 mEq/L (ref 135–145)
Total Protein: 6.6 g/dL (ref 6.0–8.3)

## 2012-12-10 LAB — POCT I-STAT TROPONIN I: Troponin i, poc: 0.01 ng/mL (ref 0.00–0.08)

## 2012-12-10 LAB — GLUCOSE, CAPILLARY
Glucose-Capillary: 117 mg/dL — ABNORMAL HIGH (ref 70–99)
Glucose-Capillary: 229 mg/dL — ABNORMAL HIGH (ref 70–99)

## 2012-12-10 LAB — PROTIME-INR: Prothrombin Time: 13.8 seconds (ref 11.6–15.2)

## 2012-12-10 LAB — APTT: aPTT: 35 seconds (ref 24–37)

## 2012-12-10 MED ORDER — LAMOTRIGINE 200 MG PO TABS
400.0000 mg | ORAL_TABLET | Freq: Every evening | ORAL | Status: DC
Start: 2012-12-10 — End: 2012-12-10
  Filled 2012-12-10: qty 2

## 2012-12-10 MED ORDER — ASPIRIN 81 MG PO CHEW: 324.0000 mg | CHEWABLE_TABLET | Freq: Once | ORAL | Status: DC

## 2012-12-10 MED ORDER — ENOXAPARIN SODIUM 40 MG/0.4ML ~~LOC~~ SOLN: 40.0000 mg | SUBCUTANEOUS | Status: DC

## 2012-12-10 MED ORDER — SODIUM CHLORIDE 0.9 % IV SOLN: 1000.0000 mL | INTRAVENOUS | Status: DC

## 2012-12-10 MED ORDER — GABAPENTIN 300 MG PO CAPS
600.0000 mg | ORAL_CAPSULE | Freq: Three times a day (TID) | ORAL | Status: DC | PRN
  Filled 2012-12-10: qty 2

## 2012-12-10 MED ORDER — B COMPLEX-C PO TABS
1.0000 | ORAL_TABLET | Freq: Every day | ORAL | Status: DC
  Administered 2012-12-10: 1 via ORAL
  Filled 2012-12-10 (×2): qty 1

## 2012-12-10 MED ORDER — INSULIN ASPART 100 UNIT/ML ~~LOC~~ SOLN: 0.0000 [IU] | Freq: Every day | SUBCUTANEOUS | Status: DC

## 2012-12-10 MED ORDER — ACETAMINOPHEN 325 MG PO TABS
650.0000 mg | ORAL_TABLET | ORAL | Status: DC | PRN
  Administered 2012-12-10: 650 mg via ORAL
  Filled 2012-12-10: qty 2

## 2012-12-10 MED ORDER — PANTOPRAZOLE SODIUM 40 MG PO TBEC
40.0000 mg | DELAYED_RELEASE_TABLET | Freq: Every day | ORAL | Status: DC
  Filled 2012-12-10: qty 1

## 2012-12-10 MED ORDER — SERTRALINE HCL 100 MG PO TABS
200.0000 mg | ORAL_TABLET | Freq: Every day | ORAL | Status: DC
  Filled 2012-12-10: qty 2

## 2012-12-10 MED ORDER — ASPIRIN EC 81 MG PO TBEC
81.0000 mg | DELAYED_RELEASE_TABLET | Freq: Every day | ORAL | Status: DC
  Filled 2012-12-10: qty 1

## 2012-12-10 MED ORDER — NITROGLYCERIN 2 % TD OINT
1.0000 [in_us] | TOPICAL_OINTMENT | Freq: Once | TRANSDERMAL | Status: AC
  Administered 2012-12-10: 1 [in_us] via TOPICAL
  Filled 2012-12-10: qty 1

## 2012-12-10 MED ORDER — INSULIN ASPART 100 UNIT/ML ~~LOC~~ SOLN
0.0000 [IU] | Freq: Three times a day (TID) | SUBCUTANEOUS | Status: DC
  Administered 2012-12-11: 2 [IU] via SUBCUTANEOUS

## 2012-12-10 MED ORDER — ONDANSETRON HCL 4 MG/2ML IJ SOLN: 4.0000 mg | Freq: Four times a day (QID) | INTRAMUSCULAR | Status: DC | PRN

## 2012-12-10 MED ORDER — IBUPROFEN 600 MG PO TABS
600.0000 mg | ORAL_TABLET | Freq: Four times a day (QID) | ORAL | Status: DC | PRN
  Administered 2012-12-10: 600 mg via ORAL
  Filled 2012-12-10 (×2): qty 1

## 2012-12-10 MED ORDER — NITROGLYCERIN 0.4 MG SL SUBL
0.4000 mg | SUBLINGUAL_TABLET | SUBLINGUAL | Status: DC | PRN
  Administered 2012-12-11 (×2): 0.4 mg via SUBLINGUAL

## 2012-12-10 MED ORDER — LAMOTRIGINE 200 MG PO TABS
400.0000 mg | ORAL_TABLET | Freq: Every evening | ORAL | Status: DC
  Administered 2012-12-10: 400 mg via ORAL
  Filled 2012-12-10 (×2): qty 2

## 2012-12-10 MED ORDER — MORPHINE SULFATE 4 MG/ML IJ SOLN
4.0000 mg | Freq: Once | INTRAMUSCULAR | Status: AC
  Administered 2012-12-10: 4 mg via INTRAVENOUS
  Filled 2012-12-10: qty 1

## 2012-12-10 MED ORDER — ENOXAPARIN SODIUM 80 MG/0.8ML ~~LOC~~ SOLN
80.0000 mg | SUBCUTANEOUS | Status: DC
  Administered 2012-12-10: 80 mg via SUBCUTANEOUS
  Filled 2012-12-10 (×2): qty 0.8

## 2012-12-10 MED ORDER — INSULIN DETEMIR 100 UNIT/ML ~~LOC~~ SOLN
50.0000 [IU] | Freq: Every day | SUBCUTANEOUS | Status: DC
  Administered 2012-12-10: 50 [IU] via SUBCUTANEOUS
  Filled 2012-12-10 (×2): qty 0.5

## 2012-12-10 MED ORDER — CLONAZEPAM 1 MG PO TABS
2.0000 mg | ORAL_TABLET | Freq: Three times a day (TID) | ORAL | Status: DC | PRN
  Administered 2012-12-10: 2 mg via ORAL
  Filled 2012-12-10: qty 2

## 2012-12-10 NOTE — ED Notes (Signed)
Pt to department via EMS- pt reports she was at work and had a sudden onset of left-sided chest pain that radiated up into her left jaw. Pt drove to [facility] and was transferred here via EMS. EKG unremarkable. Pt reports she woke up nauseated and had a headache. Pt given nitro x2. Reports some relief with nitro. Given 324 asa. 20g RAC. Bp-124/80 Hr-70

## 2012-12-10 NOTE — Progress Notes (Signed)
Is a 52 year old woman who works at [employer] tobacco presents with crushing chest pain radiating to her neck and jaw and began one hour prior to this visit. She drove herself from [place]. She's also experiencing shortness of breath. Finally she's also felt pain in her left calf pain which began about the same time as her chest pain.  Also complains of severe headache.  Patient's past medical history is complicated by morbid obesity, GERD, and diabetes for which she takes insulin. She's had a ongoing problem with stress related to job satisfaction. She's had no cardiac history.  The patient is a nonsmoker and has not had elevated blood pressures.  Medications include those for depression (Neurontin, Lamictal, Zoloft, clonazepam), that for the GERD (omeprazole), and insulin and continue meds for her diabetes.  Objective: Obese middle-aged woman who is obviously very frightened Chest: Clear, tender with palpation over the precordium and left chest Heart: Regular without murmur gallop or rub Abdomen: Mildly tender in epigastrium, no HSM palpated no masses, no rebound: Neck: Supple no adenopathy HEENT: Unremarkable with normal fundi. EKG: No acute changes, some QT interval prolongation consistent with her antidepressant medicine  Assessment: Acute crushing chest pain and shortness of breath consistent with a moderate embolus, acute cardiac event, or severe reflux. Patient definitely needs further attention and evaluation and we are sending her immediately to the emergency department Legs without edema  Chest pain  Dyspnea

## 2012-12-10 NOTE — H&P (Signed)
[facility] History and Physical  Alicia Lucero XBJ:[id number] DOB: 1961-06-28 DOA: 12/10/2012  Referring physician: er PCP: No primary provider on file.  Specialists:   Chief Complaint: chest pain  HPI: Alicia Lucero is a 52 y.o. female  Who is presenting from work with chest pain.  Patient has been under a lot of stress since a friend died months ago as well as financial stress.  She describes the chest pain as sharp- radiating to jaw and neck.  She was sitting at her desk when it happened.  Yesterday she has symptoms of a UTI.  This AM she had nausea.  She works in a smoking environment and was a former smoker as well as being diabetic.  Her father has CABG in his 12s  In the ER, she got pain relief by nitro and morphine. Her EKG did not show anything concerning, her troponins were within normal limits.    Review of Systems: all systems reviewed, negative unless stated above    Past Medical History  Diagnosis Date  . Diabetes mellitus without complication   . Depression   . GERD (gastroesophageal reflux disease)    Past Surgical History  Procedure Laterality Date  . Cholecystectomy  1996  . Tonsillectomy      as a child  . Knee arthroscopy Left 2008   Social History:  reports that she quit smoking about 7 years ago. Her smoking use included Cigarettes. She smoked 0.00 packs per day. She does not have any smokeless tobacco history on file. She reports that she does not drink alcohol or use illicit drugs.   Allergies  Allergen Reactions  . Other Nausea Only    States she can't tolerate seafood smell    Family Hx: cancer in mother and father  Prior to Admission medications   Medication Sig Start Date End Date Taking? Authorizing Provider  B Complex-C (B-COMPLEX WITH VITAMIN C) tablet Take 1 tablet by mouth daily.   Yes Historical Provider, MD  Cholecalciferol (VITAMIN D PO) Take 1 tablet by mouth daily.   Yes Historical Provider, MD  clonazePAM (KLONOPIN) 2 MG tablet  Take 2 mg by mouth 3 (three) times daily as needed for anxiety.    Yes Historical Provider, MD  gabapentin (NEURONTIN) 300 MG capsule Take 600 mg by mouth 3 (three) times daily as needed (for anxiety).   Yes Historical Provider, MD  insulin detemir (LEVEMIR FLEXPEN) 100 UNIT/ML injection Inject 50 Units into the skin at bedtime.  12/16/11  Yes Alicia Riddle, PA-C  ketoconazole (NIZORAL) 200 MG tablet Take 1 tablet (200 mg total) by mouth daily. 12/06/12  Yes [name], MD  lamoTRIgine (LAMICTAL) 200 MG tablet Take 400 mg by mouth every evening.    Yes Historical Provider, MD  Multiple Vitamins-Minerals (WOMENS MULTIVITAMIN PLUS PO) Take 1 tablet by mouth daily.    Yes Historical Provider, MD  naproxen sodium (ANAPROX) 220 MG tablet Take 220 mg by mouth 2 (two) times daily as needed (for pain).   Yes Historical Provider, MD  Omega-3 Fatty Acids (FISH OIL PO) Take 2 capsules by mouth 2 (two) times daily.   Yes Historical Provider, MD  omeprazole (PRILOSEC) 40 MG capsule Take 40 mg by mouth every morning.   Yes Historical Provider, MD  sertraline (ZOLOFT) 100 MG tablet Take 200 mg by mouth daily.     Yes Historical Provider, MD  sitaGLIPtan-metformin (JANUMET) 50-1000 MG per tablet Take 1 tablet by mouth 2 (two) times daily with a meal.  06/17/12  Yes [name], MD   Physical Exam: Filed Vitals:   12/10/12 1309 12/10/12 1400 12/10/12 1440  BP: 113/59 106/54 98/48  Pulse: 61 65 60  Temp: 97.1 F (36.2 C)    TempSrc: Oral    Resp: 16 15 13   SpO2: 94% 98% 97%     General:  A+Ox3, NAD- morbidly obese  Eyes: wnl  ENT: wnl  Neck: supple  Cardiovascular: rrr  Respiratory: clear, no wheezing  Abdomen: +BS, soft, NT  Skin: no rashes or lesions- yeast in groin  Musculoskeletal: moves all 4 ext  Psychiatric: good eye contact  Neurologic: CN 2-12 intact  Labs on Admission:  Basic Metabolic Panel:  Recent Labs Lab 12/10/12 1342  NA 139  K 3.9  CL 106  CO2 25  GLUCOSE  136*  BUN 13  CREATININE 0.76  CALCIUM 9.0   Liver Function Tests:  Recent Labs Lab 12/10/12 1342  AST 17  ALT 18  ALKPHOS 80  BILITOT 0.3  PROT 6.6  ALBUMIN 3.5   No results found for this basename: LIPASE, AMYLASE,  in the last 168 hours No results found for this basename: AMMONIA,  in the last 168 hours CBC:  Recent Labs Lab 12/10/12 1342  WBC 11.8*  HGB 12.4  HCT 36.2  MCV 82.1  PLT 291   Cardiac Enzymes: No results found for this basename: CKTOTAL, CKMB, CKMBINDEX, TROPONINI,  in the last 168 hours  BNP (last 3 results) No results found for this basename: PROBNP,  in the last 8760 hours CBG: No results found for this basename: GLUCAP,  in the last 168 hours  Radiological Exams on Admission: Dg Chest Portable 1 View  12/10/2012  *RADIOLOGY REPORT*  Clinical Data: Chest pain, former smoker, diabetes  PORTABLE CHEST - 1 VIEW  Comparison: Portable exam 1353 hours without priors for comparison  Findings: Upper normal heart size. Mediastinal contours and pulmonary vascularity normal. Lungs clear. No pleural effusion, pneumothorax or acute osseous findings.  IMPRESSION: No acute abnormalities.   Original Report Authenticated By: Ulyses Southward, M.D.     EKG: Independently reviewed. Sinus brady  Assessment/Plan Principal Problem:   Chest pain Active Problems:   Type 1 diabetes mellitus   Morbid obesity with BMI of 70 and over, adult   Depression dysuria  1. Chest pain- cycle CE, place on tele; further work up based on results- check FLP 2. Dysuria- check U/A 3. DM- SSI- continue home medications, HgbA1C 4. Morbid obesity- encourage weight loss     Code Status: full Family Communication: patient at bedside Disposition Plan: home tomm?  Time spent: 70 min  Benjamine Mola Garik Diamant [facility] Pager [phone]  If 7PM-7AM, please contact night-coverage [email or web address] Password [email or web address] 12/10/2012, 3:30 PM

## 2012-12-10 NOTE — ED Notes (Signed)
C/o sudden onset midsternal CP radiaitng into jaw while sitting at desk at work. +SOB, nausea. Described pain as crushing. Pain decreased after NTG X2.  States pain worse with palpation.

## 2012-12-10 NOTE — ED Provider Notes (Signed)
History    CSN: [id number] Arrival date & time 12/10/12  1308 First MD Initiated Contact with Patient 12/10/12 1314      Chief Complaint  Patient presents with  . Chest Pain    Patient is a 52 y.o. female presenting with chest pain. The history is provided by the patient.  Chest Pain Pain location:  Substernal area Pain quality: pressure   Pain radiates to:  L jaw Pain severity:  Severe Onset quality:  Sudden Duration:  1 hour (It is not gone yet but better.) Timing:  Constant Progression:  Improving Chronicity:  New Context comment:  Pt was sitting at her desk when it occurred. Relieved by:  Nitroglycerin Worsened by:  Nothing tried Associated symptoms: headache, nausea and shortness of breath   Associated symptoms: no abdominal pain   Risk factors: diabetes mellitus and obesity   Risk factors: no hypertension and no smoking   Father CABG in his 28s.  Past Medical History  Diagnosis Date  . Diabetes mellitus without complication   . Depression   . GERD (gastroesophageal reflux disease)     Past Surgical History  Procedure Laterality Date  . Cholecystectomy  1996  . Tonsillectomy      as a child  . Knee arthroscopy Left 2008    History reviewed. No pertinent family history.  History  Substance Use Topics  . Smoking status: Former Smoker    Types: Cigarettes    Quit date: 06/30/2005  . Smokeless tobacco: Not on file     Comment: electronic cigarettes PRN  . Alcohol Use: No    OB History   Grav Para Term Preterm Abortions TAB SAB Ect Mult Living                  Review of Systems  Respiratory: Positive for shortness of breath.   Cardiovascular: Positive for chest pain.  Gastrointestinal: Positive for nausea. Negative for abdominal pain.  Neurological: Positive for headaches.  All other systems reviewed and are negative.    Allergies  Other  Home Medications   Current Outpatient Rx  Name  Route  Sig  Dispense  Refill  . B Complex-C (B-COMPLEX  WITH VITAMIN C) tablet   Oral   Take 1 tablet by mouth daily.         . Cholecalciferol (VITAMIN D PO)   Oral   Take 1 tablet by mouth daily.         . clonazePAM (KLONOPIN) 2 MG tablet   Oral   Take 2 mg by mouth 3 (three) times daily as needed for anxiety.          . gabapentin (NEURONTIN) 300 MG capsule   Oral   Take 600 mg by mouth 3 (three) times daily as needed (for anxiety).         . insulin detemir (LEVEMIR FLEXPEN) 100 UNIT/ML injection   Subcutaneous   Inject 50 Units into the skin at bedtime.          Marland Kitchen ketoconazole (NIZORAL) 200 MG tablet   Oral   Take 1 tablet (200 mg total) by mouth daily.   10 tablet   0   . lamoTRIgine (LAMICTAL) 200 MG tablet   Oral   Take 400 mg by mouth every evening.          . Multiple Vitamins-Minerals (WOMENS MULTIVITAMIN PLUS PO)   Oral   Take 1 tablet by mouth daily.          Marland Kitchen  naproxen sodium (ANAPROX) 220 MG tablet   Oral   Take 220 mg by mouth 2 (two) times daily as needed (for pain).         . Omega-3 Fatty Acids (FISH OIL PO)   Oral   Take 2 capsules by mouth 2 (two) times daily.         Marland Kitchen omeprazole (PRILOSEC) 40 MG capsule   Oral   Take 40 mg by mouth every morning.         . sertraline (ZOLOFT) 100 MG tablet   Oral   Take 200 mg by mouth daily.           . sitaGLIPtan-metformin (JANUMET) 50-1000 MG per tablet   Oral   Take 1 tablet by mouth 2 (two) times daily with a meal.   60 tablet   11     BP 98/48  Pulse 60  Temp(Src) 97.1 F (36.2 C) (Oral)  Resp 13  SpO2 97%  LMP 11/28/2012  Physical Exam  Nursing note and vitals reviewed. Constitutional: No distress.  Morbidly obese   HENT:  Head: Normocephalic and atraumatic.  Right Ear: External ear normal.  Left Ear: External ear normal.  Eyes: Conjunctivae are normal. Right eye exhibits no discharge. Left eye exhibits no discharge. No scleral icterus.  Neck: Neck supple. No tracheal deviation present.  Cardiovascular:  Normal rate, regular rhythm and intact distal pulses.   Pulmonary/Chest: Effort normal and breath sounds normal. No stridor. No respiratory distress. She has no wheezes. She has no rales.  Abdominal: Soft. Bowel sounds are normal. She exhibits no distension. There is no tenderness. There is no rebound and no guarding.  Musculoskeletal: She exhibits no edema and no tenderness.  Neurological: She is alert. She has normal strength. No sensory deficit. Cranial nerve deficit:  no gross defecits noted. She exhibits normal muscle tone. She displays no seizure activity. Coordination normal.  Skin: Skin is warm and dry. No rash noted.  Psychiatric: She has a normal mood and affect.    ED Course  Procedures (including critical care time)  Rate: 50  Rhythm: sinus bradycardia  QRS Axis: normal  Intervals: normal  ST/T Wave abnormalities: normal  Conduction Disutrbances:none  Narrative Interpretation:   Old EKG Reviewed: none available  Labs Reviewed  CBC - Abnormal; Notable for the following:    WBC 11.8 (*)    All other components within normal limits  PROTIME-INR  APTT  COMPREHENSIVE METABOLIC PANEL  POCT I-STAT TROPONIN I   Dg Chest Portable 1 View  12/10/2012  *RADIOLOGY REPORT*  Clinical Data: Chest pain, former smoker, diabetes  PORTABLE CHEST - 1 VIEW  Comparison: Portable exam 1353 hours without priors for comparison  Findings: Upper normal heart size. Mediastinal contours and pulmonary vascularity normal. Lungs clear. No pleural effusion, pneumothorax or acute osseous findings.  IMPRESSION: No acute abnormalities.   Original Report Authenticated By: Ulyses Southward, M.D.     1. Chest pain   2. Morbid obesity with BMI of 70 and over, adult   3. Diabetes     MDM  Patient presents to the emergency room with chest pain. She has risk factors of morbid obesity and diabetes. Symptoms are concerning for acute coronary syndrome.  Considering her symptoms and risk factors I will consult the  medical service regarding admission for further evaluation.        Celene Kras, MD 12/10/12 [phone]

## 2012-12-10 NOTE — ED Notes (Signed)
Admitting MD at bedside.

## 2012-12-11 ENCOUNTER — Encounter (HOSPITAL_COMMUNITY): Payer: Self-pay | Admitting: *Deleted

## 2012-12-11 DIAGNOSIS — F329 Major depressive disorder, single episode, unspecified: Secondary | ICD-10-CM

## 2012-12-11 DIAGNOSIS — F410 Panic disorder [episodic paroxysmal anxiety] without agoraphobia: Secondary | ICD-10-CM

## 2012-12-11 LAB — TROPONIN I
Troponin I: <0.3 ng/mL (ref <0.30)
Troponin I: <0.3 ng/mL (ref <0.30)

## 2012-12-11 LAB — BASIC METABOLIC PANEL
BUN: 15 mg/dL (ref 6–23)
CO2: 25 mEq/L (ref 19–32)
Chloride: 106 mEq/L (ref 96–112)
GFR calc non Af Amer: 66 mL/min — ABNORMAL LOW (ref >90)
Glucose, Bld: 148 mg/dL — ABNORMAL HIGH (ref 70–99)
Potassium: 3.7 mEq/L (ref 3.5–5.1)
Sodium: 140 mEq/L (ref 135–145)

## 2012-12-11 LAB — LIPID PANEL
HDL: 49 mg/dL (ref >39)
LDL Cholesterol: 99 mg/dL (ref 0–99)

## 2012-12-11 LAB — CBC
HCT: 38.2 % (ref 36.0–46.0)
Hemoglobin: 12.9 g/dL (ref 12.0–15.0)
RBC: 4.61 MIL/uL (ref 3.87–5.11)
WBC: 9.2 10*3/uL (ref 4.0–10.5)

## 2012-12-11 LAB — TSH: TSH: 3.851 u[IU]/mL (ref 0.350–4.500)

## 2012-12-11 LAB — GLUCOSE, CAPILLARY: Glucose-Capillary: 138 mg/dL — ABNORMAL HIGH (ref 70–99)

## 2012-12-11 LAB — HEMOGLOBIN A1C: Mean Plasma Glucose: 194 mg/dL — ABNORMAL HIGH (ref <117)

## 2012-12-11 MED ORDER — ASPIRIN 81 MG PO TBEC: 81.0000 mg | DELAYED_RELEASE_TABLET | Freq: Every day | ORAL | Status: DC

## 2012-12-11 NOTE — Progress Notes (Signed)
Utilization Review completed.  

## 2012-12-11 NOTE — Discharge Summary (Signed)
Physician Discharge Summary  Alicia Lucero ZOX:[id number] DOB: 21-Jan-1961 DOA: 12/10/2012  PCP: No primary provider on file.  Admit date: 12/10/2012 Discharge date: 12/11/2012  Time spent: > 35 minutes  Recommendations for Outpatient Follow-up:  Please be sure to schedule a follow up with a cardiologist of your choice.  Consideration for stress test given complaints.  Discharge Diagnoses:  Principal Problem:   Chest pain Active Problems:   Type 1 diabetes mellitus   Morbid obesity with BMI of 70 and over, adult   Depression   Discharge Condition: stable  Diet recommendation: heart healthy/diabetic diet  Filed Weights   12/10/12 1730 12/10/12 1803 12/11/12 0500  Weight: 155.1 kg (341 lb 14.9 oz) 155.765 kg (343 lb 6.4 oz) 158.668 kg (349 lb 12.8 oz)    History of present illness:  52 y/o with h/o DM, Panic disorder, and depression.  Presented to the ED complaining of chest discomfort.  Hospital Course:  1. Chest pain- Cardiac enzymes x 3 negative. EKG normal sinus rhythm with no ST elevations or depressions.  Recommend a baby aspirin daily. Also have provided script for patient to f/u with cardiologist to evaluate further with stress test.  No red flags reported on telemetry. 2. Dysuria- Resolved no complaints on day of discharge. No leukocytosis on day of discharge. 3. DM- Patient to continue home regimen. 4. Morbid obesity- encourage weight loss  Procedures:  none  Consultations:  none  Discharge Exam: Filed Vitals:   12/11/12 0715 12/11/12 0724 12/11/12 0728 12/11/12 0832  BP: 122/65 109/52 111/65 111/44  Pulse: 70 69 70 68  Temp:      TempSrc:      Resp:      Height:      Weight:      SpO2:        General: Pt in NAD, Alert and Awake Cardiovascular: RRR, No MRG Respiratory: CTA BL, no wheezes  Discharge Instructions  Discharge Orders   Future Appointments Provider Department Dept Phone   01/06/2013 9:45 AM [name], MD [facility]  [phone]   01/11/2013 3:30 PM Verner Chol, CNM [facility] [phone]   Future Orders Complete By Expires     Call MD for:  extreme fatigue  As directed     Call MD for:  persistant nausea and vomiting  As directed     Call MD for:  severe uncontrolled pain  As directed     Call MD for:  temperature >100.4  As directed     Diet - low sodium heart healthy  As directed     Discharge instructions  As directed     Comments:      Recommend patient follow up with cardiologist for further evaluation and recommendations as I think based on history patient would benefit from stress test given initial complaints.  Also recommend aspirin daily.    Increase activity slowly  As directed         Medication List    TAKE these medications       aspirin 81 MG EC tablet  Take 1 tablet (81 mg total) by mouth daily.     B-complex with vitamin C tablet  Take 1 tablet by mouth daily.     clonazePAM 2 MG tablet  Commonly known as:  KLONOPIN  Take 2 mg by mouth 3 (three) times daily as needed for anxiety.     FISH OIL PO  Take 2 capsules by mouth 2 (two) times  daily.     gabapentin 300 MG capsule  Commonly known as:  NEURONTIN  Take 600 mg by mouth 3 (three) times daily as needed (for anxiety).     ketoconazole 200 MG tablet  Commonly known as:  NIZORAL  Take 1 tablet (200 mg total) by mouth daily.     lamoTRIgine 200 MG tablet  Commonly known as:  LAMICTAL  Take 400 mg by mouth every evening.     LEVEMIR FLEXPEN 100 UNIT/ML injection  Generic drug:  insulin detemir  Inject 50 Units into the skin at bedtime.     naproxen sodium 220 MG tablet  Commonly known as:  ANAPROX  Take 220 mg by mouth 2 (two) times daily as needed (for pain).     omeprazole 40 MG capsule  Commonly known as:  PRILOSEC  Take 40 mg by mouth every morning.     sertraline 100 MG tablet  Commonly known as:  ZOLOFT  Take 200 mg by mouth daily.     sitaGLIPtan-metformin 50-1000 MG per  tablet  Commonly known as:  JANUMET  Take 1 tablet by mouth 2 (two) times daily with a meal.     VITAMIN D PO  Take 1 tablet by mouth daily.     WOMENS MULTIVITAMIN PLUS PO  Take 1 tablet by mouth daily.          The results of significant diagnostics from this hospitalization (including imaging, microbiology, ancillary and laboratory) are listed below for reference.    Significant Diagnostic Studies: Dg Chest Portable 1 View  12/10/2012  *RADIOLOGY REPORT*  Clinical Data: Chest pain, former smoker, diabetes  PORTABLE CHEST - 1 VIEW  Comparison: Portable exam 1353 hours without priors for comparison  Findings: Upper normal heart size. Mediastinal contours and pulmonary vascularity normal. Lungs clear. No pleural effusion, pneumothorax or acute osseous findings.  IMPRESSION: No acute abnormalities.   Original Report Authenticated By: Ulyses Southward, M.D.     Microbiology: No results found for this or any previous visit (from the past 240 hour(s)).   Labs: Basic Metabolic Panel:  Recent Labs Lab 12/10/12 1342 12/11/12 0700  NA 139 140  K 3.9 3.7  CL 106 106  CO2 25 25  GLUCOSE 136* 148*  BUN 13 15  CREATININE 0.76 0.99  CALCIUM 9.0 9.0   Liver Function Tests:  Recent Labs Lab 12/10/12 1342  AST 17  ALT 18  ALKPHOS 80  BILITOT 0.3  PROT 6.6  ALBUMIN 3.5   No results found for this basename: LIPASE, AMYLASE,  in the last 168 hours No results found for this basename: AMMONIA,  in the last 168 hours CBC:  Recent Labs Lab 12/10/12 1342 12/11/12 0700  WBC 11.8* 9.2  HGB 12.4 12.9  HCT 36.2 38.2  MCV 82.1 82.9  PLT 291 262   Cardiac Enzymes:  Recent Labs Lab 12/10/12 0013 12/10/12 1840 12/11/12 0700  TROPONINI <0.30 <0.30 <0.30   BNP: BNP (last 3 results) No results found for this basename: PROBNP,  in the last 8760 hours CBG:  Recent Labs Lab 12/10/12 1809 12/10/12 2124 12/11/12 0744  GLUCAP 117* 229* 138*       Signed:  Penny Pia  [facility] 12/11/2012, 9:42 AM

## 2012-12-11 NOTE — Progress Notes (Signed)
Pt clarified, she is a Type 2 Diabetic, NOT type 1 as listed under hospital problems in d/c paperwork.

## 2012-12-12 ENCOUNTER — Emergency Department (HOSPITAL_COMMUNITY)
Admission: RE | Admit: 2012-12-12 | Discharge: 2012-12-12 | Disposition: A | Discharge status: Home / Self Care | Payer: 59 | Source: Ambulatory Visit | Attending: Family Medicine | Admitting: Family Medicine

## 2012-12-12 ENCOUNTER — Ambulatory Visit (INDEPENDENT_AMBULATORY_CARE_PROVIDER_SITE_OTHER): Payer: 59 | Admitting: Family Medicine

## 2012-12-12 ENCOUNTER — Encounter: Payer: Self-pay | Admitting: Family Medicine

## 2012-12-12 ENCOUNTER — Emergency Department (HOSPITAL_COMMUNITY)
Admission: EM | Admit: 2012-12-12 | Discharge: 2012-12-12 | Discharge status: Against Medical Advice | Payer: 59 | Attending: Emergency Medicine | Admitting: Emergency Medicine

## 2012-12-12 ENCOUNTER — Ambulatory Visit (HOSPITAL_COMMUNITY)
Admission: RE | Admit: 2012-12-12 | Discharge: 2012-12-12 | Disposition: A | Discharge status: Home / Self Care | Payer: 59 | Source: Ambulatory Visit | Attending: Family Medicine | Admitting: Family Medicine

## 2012-12-12 VITALS — BP 120/80 | HR 78 | Temp 98.1°F | Resp 16 | Ht 65.3 in | Wt 346.2 lb

## 2012-12-12 DIAGNOSIS — R079 Chest pain, unspecified: Secondary | ICD-10-CM

## 2012-12-12 DIAGNOSIS — M79609 Pain in unspecified limb: Secondary | ICD-10-CM | POA: Insufficient documentation

## 2012-12-12 DIAGNOSIS — Z87891 Personal history of nicotine dependence: Secondary | ICD-10-CM | POA: Insufficient documentation

## 2012-12-12 DIAGNOSIS — R0602 Shortness of breath: Secondary | ICD-10-CM

## 2012-12-12 DIAGNOSIS — M25562 Pain in left knee: Secondary | ICD-10-CM

## 2012-12-12 DIAGNOSIS — Z5320 Procedure and treatment not carried out because of patient's decision for unspecified reasons: Secondary | ICD-10-CM | POA: Insufficient documentation

## 2012-12-12 DIAGNOSIS — M25569 Pain in unspecified knee: Secondary | ICD-10-CM

## 2012-12-12 DIAGNOSIS — E119 Type 2 diabetes mellitus without complications: Secondary | ICD-10-CM | POA: Insufficient documentation

## 2012-12-12 LAB — D-DIMER, QUANTITATIVE: D-Dimer, Quant: 0.48 ug/mL-FEU (ref 0.00–0.48)

## 2012-12-12 MED ORDER — IOHEXOL 350 MG/ML SOLN
90.0000 mL | Freq: Once | INTRAVENOUS | Status: AC | PRN
  Administered 2012-12-12: 90 mL via INTRAVENOUS

## 2012-12-12 NOTE — Patient Instructions (Addendum)
Please go to [facility] and register as outpatient.  Have the ER call Vascular Lab immediately for a doppler study.  When you are done with the doppler study, have them direct you to Radiology for a CT Angiogram.

## 2012-12-12 NOTE — Progress Notes (Signed)
52 yo woman recently hospitalized for acute chest pain, dyspnea and left leg pain.  Cardiac etiology was ruled out and she was given Lovenox.   Discharged yesterday.  No d-dimer done, no CT of the chest done.  Her chest pain gradually subsided.  She was given frequent doses of nitroglycerin and the attending said she need blood pressure medicine, although no blood pressure elevations were documented.  Blood sugar 117, followed by regular diet and Blood sugar soared to over 200.  Her Janumet was not continued in the hospital.  Objective:  NAD Chest:  Clear Heart: regular, no murmur Ext:  Tender left calf on medial side.  Assessment:  Clearly a pulmonary embolus needs to be ruled out.    Plan:   Chest CT with contrast to rule out pulmonary embolus Venous doppler for left leg  Counseled patient on weight.   CT and venous doppler are normal. Patient will take tomorrow off.  We are going to file for short term disability starting Friday (April 4th) midday due to DM complicated by acute chest and leg pains.

## 2012-12-12 NOTE — Progress Notes (Signed)
VASCULAR LAB PRELIMINARY  PRELIMINARY  PRELIMINARY  PRELIMINARY  Left lower extremity venous Doppler completed.    Preliminary report:  There is no DVT or SVT noted in the left lower extremity.  Kaitlinn Iversen, RVT 12/12/2012, 4:16 PM

## 2012-12-13 ENCOUNTER — Other Ambulatory Visit (HOSPITAL_COMMUNITY): Payer: 59

## 2012-12-17 ENCOUNTER — Ambulatory Visit (INDEPENDENT_AMBULATORY_CARE_PROVIDER_SITE_OTHER): Payer: 59 | Admitting: Family Medicine

## 2012-12-17 VITALS — BP 129/82 | HR 80 | Temp 98.4°F | Resp 18 | Wt 341.0 lb

## 2012-12-17 DIAGNOSIS — F329 Major depressive disorder, single episode, unspecified: Secondary | ICD-10-CM

## 2012-12-17 DIAGNOSIS — R079 Chest pain, unspecified: Secondary | ICD-10-CM

## 2012-12-17 DIAGNOSIS — E109 Type 1 diabetes mellitus without complications: Secondary | ICD-10-CM

## 2012-12-17 LAB — GLUCOSE, POCT (MANUAL RESULT ENTRY): POC Glucose: 190 mg/dl — AB (ref 70–99)

## 2012-12-17 NOTE — Progress Notes (Signed)
52 yo diabetic, obese woman with recent chest pain episode.  She's been out of work this past week because of the chest pain.  All tests to date have been negative.    Still having chest pain and shortness of breath from time to time.  She's been sleeping quite a bit, and feels very depressed with frequent crying spells.  She has an appointment with her psychiatrist next week.  Friend dying is still haunting her.  Some suicidal thoughts but no plan  Unable to work at this point  Objective:  Appears weary. Mild tenderness left medial calf (negative venous doppler) Chest:  Clear Heart:  Reg, no rub or gallop Ext:  No swelling, mildly tender medial left thigh without cords or heat.  Results for orders placed in visit on 12/17/12  GLUCOSE, POCT (MANUAL RESULT ENTRY)      Result Value Range   POC Glucose 190 (*) 70 - 99 mg/dl   I spent 45 minutes face to face with patient.  Assessment:  Depression, chest pain, and diabetes, uncontrolled.  Overall, this is a manageable situation as outpatient, but close follow up is essential.  The weight loss is encouraging.  Time off for at least 3 weeks is mandatory.  Plan:  Drop insulin to 35 u levimir daily (to control appetite).  Daily check on FBS and call if > 250.   Out of work Psych followup 3 days  [employer] phone numbers [phone] (disability office)  [phone] (fax to disability) [employer] contact:  Margrett Rud [phone]  Signed, [name], MD

## 2012-12-20 ENCOUNTER — Telehealth: Payer: Self-pay

## 2012-12-20 NOTE — Telephone Encounter (Signed)
How are her sugars? Called her and she has just gotten the meter, has not checked it yet, she did check it when we were on phone. She states it is 192. She states rash is under skin fold of her abdomen, very red, goes into groin area. Has taken the ketoconazole with very little relief.

## 2012-12-20 NOTE — Telephone Encounter (Signed)
Pt states her groin rash is not better,please advise.   Best phone [phone]   Pharmacy [facility]

## 2012-12-22 ENCOUNTER — Telehealth: Payer: Self-pay

## 2012-12-22 MED ORDER — NYSTATIN 100000 UNIT/GM EX POWD: Freq: Four times a day (QID) | CUTANEOUS | Status: DC

## 2012-12-22 NOTE — Telephone Encounter (Signed)
D/C the ketoconazole cream and cornstarch. She's completed the Nizoral tablets.  Meds ordered this encounter  Medications  . nystatin (MYCOSTATIN) powder    Sig: Apply topically 4 (four) times daily.    Dispense:  60 g    Refill:  1    Order Specific Question:  Supervising Provider    Answer:  DOOLITTLE, ROBERT P [[id number]]   Keep the area as dry as possible. If ineffective, RTC for additional evaluation.

## 2012-12-22 NOTE — Telephone Encounter (Signed)
Patient has rash under skin fold of abdominal panus, she states ketoconazole not helping, area very irritated, please advise, this is her 2nd call previous message sent to Dr Milus Glazier, he is out.

## 2012-12-22 NOTE — Telephone Encounter (Signed)
Pt was advised to call back if her rash was not getting any better and she is calling to see if something else  Had been called in for her. She states that she called the other day. Her call back number is  [phone]

## 2012-12-22 NOTE — Telephone Encounter (Signed)
LM WITH LADY TO CB 

## 2012-12-23 NOTE — Telephone Encounter (Signed)
Have patient come in if rash is not improving.  It is really difficult to diagnose rashes over the phone.

## 2012-12-23 NOTE — Telephone Encounter (Signed)
Thanks, I called her to advise. Left message for her to call me back.

## 2012-12-24 ENCOUNTER — Ambulatory Visit (INDEPENDENT_AMBULATORY_CARE_PROVIDER_SITE_OTHER): Payer: 59

## 2012-12-27 ENCOUNTER — Ambulatory Visit (INDEPENDENT_AMBULATORY_CARE_PROVIDER_SITE_OTHER): Payer: 59 | Admitting: Family Medicine

## 2012-12-27 VITALS — BP 147/86 | HR 86 | Temp 98.5°F | Resp 16 | Ht 65.0 in | Wt 348.0 lb

## 2012-12-27 DIAGNOSIS — R059 Cough, unspecified: Secondary | ICD-10-CM

## 2012-12-27 DIAGNOSIS — R05 Cough: Secondary | ICD-10-CM

## 2012-12-27 DIAGNOSIS — E119 Type 2 diabetes mellitus without complications: Secondary | ICD-10-CM

## 2012-12-27 LAB — COMPREHENSIVE METABOLIC PANEL
ALT: 12 U/L (ref 0–35)
AST: 12 U/L (ref 0–37)
Albumin: 3.7 g/dL (ref 3.5–5.2)
Alkaline Phosphatase: 99 U/L (ref 39–117)
BUN: 15 mg/dL (ref 6–23)
CO2: 27 mEq/L (ref 19–32)
Calcium: 9.3 mg/dL (ref 8.4–10.5)
Chloride: 104 mEq/L (ref 96–112)
Creat: 0.76 mg/dL (ref 0.50–1.10)
Glucose, Bld: 337 mg/dL — ABNORMAL HIGH (ref 70–99)
Potassium: 4.5 mEq/L (ref 3.5–5.3)
Sodium: 138 mEq/L (ref 135–145)
Total Bilirubin: 0.3 mg/dL (ref 0.3–1.2)
Total Protein: 6.4 g/dL (ref 6.0–8.3)

## 2012-12-27 LAB — POCT GLYCOSYLATED HEMOGLOBIN (HGB A1C): Hemoglobin A1C: 7.9

## 2012-12-27 MED ORDER — ALBUTEROL SULFATE (2.5 MG/3ML) 0.083% IN NEBU: 2.5000 mg | INHALATION_SOLUTION | Freq: Four times a day (QID) | RESPIRATORY_TRACT | Status: DC | PRN

## 2012-12-27 NOTE — Telephone Encounter (Signed)
She is here now

## 2012-12-27 NOTE — Progress Notes (Signed)
52 yo woman with recent chest pain, negative cardiac and PE.  She has a mild cough now with some mild left chest pain  She's disappointed that she has gained 5 # from over eating over Easter.    Working on short time disability.  Notes increasing thirst.  Only eats at night.  We discussed the importance of not eating entire day's food at night.  Objective:  NAD  Chest clear Heart  Regular without murmur. Extremities:  No edema Results for orders placed in visit on 12/27/12  POCT GLYCOSYLATED HEMOGLOBIN (HGB A1C)      Result Value Range   Hemoglobin A1C 7.9       Assessment:  RAD, cough Also, overdue for diabetes follow up  Plan:  Albuterol INH Call Saul Fordyce about possible use of Belviq

## 2012-12-29 ENCOUNTER — Encounter: Payer: Self-pay | Admitting: Family Medicine

## 2012-12-29 ENCOUNTER — Telehealth: Payer: Self-pay

## 2012-12-29 NOTE — Telephone Encounter (Signed)
Can we change albuterol to an inhaler?

## 2012-12-29 NOTE — Telephone Encounter (Signed)
Pt is needing to someone about her albuteral rx that was called in -we called in albuteral liquid and she does not have the machine to go with this she thought that it was going to be an inhaler

## 2012-12-30 ENCOUNTER — Telehealth: Payer: Self-pay | Admitting: Radiology

## 2012-12-30 ENCOUNTER — Telehealth: Payer: Self-pay

## 2012-12-30 MED ORDER — ALBUTEROL SULFATE HFA 108 (90 BASE) MCG/ACT IN AERS: 2.0000 | INHALATION_SPRAY | Freq: Four times a day (QID) | RESPIRATORY_TRACT | Status: DC | PRN

## 2012-12-30 NOTE — Telephone Encounter (Signed)
I am so sorry that this happened - please let the patient know that I sent in the Rx.

## 2012-12-30 NOTE — Telephone Encounter (Signed)
Okay to keep patient out of work indefinitely

## 2012-12-30 NOTE — Telephone Encounter (Signed)
PT STATES SHE NEEDS DR L TO FAX ANOTHER NOTE TO HER EMPLOYER STATING SHE IS STILL OUT OF WORK. NOTE NEEDS TO GO  TO [employer] @ FAX NUMBER [phone]

## 2012-12-30 NOTE — Telephone Encounter (Signed)
Okay to extend out of work?

## 2012-12-30 NOTE — Telephone Encounter (Signed)
faxed

## 2012-12-30 NOTE — Telephone Encounter (Signed)
Thanks, called her to advise.  

## 2012-12-30 NOTE — Telephone Encounter (Signed)
Patient also advised work note was sent.,

## 2012-12-31 ENCOUNTER — Telehealth: Payer: Self-pay

## 2012-12-31 NOTE — Telephone Encounter (Signed)
Pt is wanting her disability papers mailed to her instead of her picking them up

## 2013-01-03 ENCOUNTER — Encounter: Payer: Self-pay | Admitting: Family Medicine

## 2013-01-03 ENCOUNTER — Ambulatory Visit: Payer: 59

## 2013-01-05 ENCOUNTER — Ambulatory Visit (INDEPENDENT_AMBULATORY_CARE_PROVIDER_SITE_OTHER): Payer: 59 | Admitting: Family Medicine

## 2013-01-05 ENCOUNTER — Encounter: Payer: Self-pay | Admitting: Family Medicine

## 2013-01-05 VITALS — BP 120/81 | HR 99 | Temp 98.0°F | Resp 17 | Ht 65.0 in | Wt 338.0 lb

## 2013-01-05 DIAGNOSIS — M25561 Pain in right knee: Secondary | ICD-10-CM

## 2013-01-05 DIAGNOSIS — F4323 Adjustment disorder with mixed anxiety and depressed mood: Secondary | ICD-10-CM

## 2013-01-05 DIAGNOSIS — M5430 Sciatica, unspecified side: Secondary | ICD-10-CM

## 2013-01-05 DIAGNOSIS — E1159 Type 2 diabetes mellitus with other circulatory complications: Secondary | ICD-10-CM

## 2013-01-05 DIAGNOSIS — M25569 Pain in unspecified knee: Secondary | ICD-10-CM

## 2013-01-05 DIAGNOSIS — R079 Chest pain, unspecified: Secondary | ICD-10-CM

## 2013-01-05 DIAGNOSIS — M5431 Sciatica, right side: Secondary | ICD-10-CM

## 2013-01-05 NOTE — Progress Notes (Signed)
52 yo with recent weight gain and elevated glucose.   She has been given a temporary leave of absence to get things under control again. Checked sugar this am 228. Yesterday she ate 2 kind bars, steamed vegetables and chicken with 1/4 cup of rice  Exercising with poetry basketball.  FHx:  Sister Byrd Hesselbach has relapsing remitting MS; lives in [place]  Patient notes chronic knee pain, back pain. The chest pain is better but still intermittent. Anxiety/depression persist with rapid cycling.  She saw Colen Darling last week and will follow up.  Objective:  NAD Alert with good eye contact.   Chest:  Clear Heart:  Regular without murmur or gallop Microfilament testing:  Loss of feeling end of great and 2nd toes with decreased feeling sole of metatarsals on right. No calluses or skin breaks.  Assessment:  Multiple problems but she has made progress with the weight which is the most crucial element in diabetic control.  Plan:  Continue current program.  Let's do weekly weigh ins and we'll check the microalbumin.   Signed, [name], MD

## 2013-01-06 ENCOUNTER — Ambulatory Visit: Payer: 59 | Admitting: Family Medicine

## 2013-01-06 LAB — MICROALBUMIN, URINE: Microalb, Ur: 4.22 mg/dL — ABNORMAL HIGH (ref 0.00–1.89)

## 2013-01-07 ENCOUNTER — Encounter: Payer: Self-pay | Admitting: Certified Nurse Midwife

## 2013-01-11 ENCOUNTER — Ambulatory Visit: Payer: 59 | Admitting: Certified Nurse Midwife

## 2013-01-11 DIAGNOSIS — Z01419 Encounter for gynecological examination (general) (routine) without abnormal findings: Secondary | ICD-10-CM

## 2013-01-12 ENCOUNTER — Encounter: Payer: Self-pay | Admitting: Certified Nurse Midwife

## 2013-01-13 ENCOUNTER — Encounter: Payer: Self-pay | Admitting: Family Medicine

## 2013-01-13 ENCOUNTER — Ambulatory Visit (INDEPENDENT_AMBULATORY_CARE_PROVIDER_SITE_OTHER): Payer: 59 | Admitting: Family Medicine

## 2013-01-13 VITALS — BP 112/64 | HR 88 | Temp 98.8°F | Resp 16 | Ht 64.0 in | Wt 344.0 lb

## 2013-01-13 DIAGNOSIS — E1065 Type 1 diabetes mellitus with hyperglycemia: Secondary | ICD-10-CM

## 2013-01-13 DIAGNOSIS — E1142 Type 2 diabetes mellitus with diabetic polyneuropathy: Secondary | ICD-10-CM

## 2013-01-13 DIAGNOSIS — J029 Acute pharyngitis, unspecified: Secondary | ICD-10-CM

## 2013-01-13 DIAGNOSIS — R300 Dysuria: Secondary | ICD-10-CM

## 2013-01-13 LAB — POCT URINALYSIS DIPSTICK
Bilirubin, UA: NEGATIVE
Blood, UA: NEGATIVE
Glucose, UA: NEGATIVE
Ketones, UA: NEGATIVE
Leukocytes, UA: NEGATIVE
Nitrite, UA: NEGATIVE
Protein, UA: NEGATIVE
Spec Grav, UA: >=1.03
Urobilinogen, UA: 0.2
pH, UA: 5.5

## 2013-01-13 LAB — POCT UA - MICROSCOPIC ONLY
Casts, Ur, LPF, POC: NEGATIVE
Crystals, Ur, HPF, POC: NEGATIVE
Yeast, UA: NEGATIVE

## 2013-01-13 NOTE — Progress Notes (Signed)
52 yo woman with two days of dysuria.  No fever, sweats  Also has scratchy throats  Not checking sugars   Objective:  NAD  Results for orders placed in visit on 01/13/13  POCT UA - MICROSCOPIC ONLY      Result Value Range   WBC, Ur, HPF, POC 1-4     RBC, urine, microscopic 0-2     Bacteria, U Microscopic 2+     Mucus, UA trace     Epithelial cells, urine per micros 1-6     Crystals, Ur, HPF, POC neg     Casts, Ur, LPF, POC neg     Yeast, UA neg    POCT URINALYSIS DIPSTICK      Result Value Range   Color, UA yellow     Clarity, UA cloudy     Glucose, UA neg     Bilirubin, UA neg     Ketones, UA neg     Spec Grav, UA >=1.030     Blood, UA neg     pH, UA 5.5     Protein, UA neg     Urobilinogen, UA 0.2     Nitrite, UA neg     Leukocytes, UA Negative     Oroph:  Mild erythema of uvula Tm's: normal Neck:  No adenopathy  Assessment:  Viral pharyngitis, yeast infection, diabetes  Plan:  Diflucan 150 po Work on diet:  Long (40 minute face to face) discussion/motivation to work on diet and exercise.  Follow up 2 weeks Call me if sore throat does not resolve in 48 hours  Signed, [name], MD

## 2013-01-15 LAB — URINE CULTURE: Colony Count: 35000

## 2013-01-17 ENCOUNTER — Telehealth: Payer: Self-pay

## 2013-01-17 NOTE — Telephone Encounter (Signed)
Nurse from [employer] is calling regarding short term disability and work restrictions for [employer]. Nurse has a few questions that she would like answered: 1.) What symptoms did the patient have during her OV on 12/17/2012? 2.) When is her return to work date? 3.) When is patient's next office visit? Please call Marcelino Duster at [phone] ext.[phone].

## 2013-01-19 ENCOUNTER — Other Ambulatory Visit: Payer: Self-pay | Admitting: Family Medicine

## 2013-01-19 ENCOUNTER — Ambulatory Visit (INDEPENDENT_AMBULATORY_CARE_PROVIDER_SITE_OTHER): Payer: 59 | Admitting: Family Medicine

## 2013-01-19 VITALS — BP 135/85 | HR 97 | Temp 98.2°F | Resp 18 | Wt 343.0 lb

## 2013-01-19 DIAGNOSIS — K5732 Diverticulitis of large intestine without perforation or abscess without bleeding: Secondary | ICD-10-CM

## 2013-01-19 DIAGNOSIS — R1032 Left lower quadrant pain: Secondary | ICD-10-CM

## 2013-01-19 DIAGNOSIS — E119 Type 2 diabetes mellitus without complications: Secondary | ICD-10-CM

## 2013-01-19 LAB — POCT CBC
Granulocyte percent: 76.1 %G (ref 37–80)
HCT, POC: 38.2 % (ref 37.7–47.9)
Hemoglobin: 11.9 g/dL — AB (ref 12.2–16.2)
Lymph, poc: 2 (ref 0.6–3.4)
MCH, POC: 27.7 pg (ref 27–31.2)
MCHC: 31.2 g/dL — AB (ref 31.8–35.4)
MCV: 89.1 fL (ref 80–97)
MID (cbc): 0.6 (ref 0–0.9)
MPV: 8.8 fL (ref 0–99.8)
POC Granulocyte: 8.3 — AB (ref 2–6.9)
POC LYMPH PERCENT: 18.4 %L (ref 10–50)
POC MID %: 5.5 %M (ref 0–12)
Platelet Count, POC: 313 10*3/uL (ref 142–424)
RBC: 4.29 M/uL (ref 4.04–5.48)
RDW, POC: 16.6 %
WBC: 10.9 10*3/uL — AB (ref 4.6–10.2)

## 2013-01-19 LAB — GLUCOSE, POCT (MANUAL RESULT ENTRY): POC Glucose: 155 mg/dl — AB (ref 70–99)

## 2013-01-19 MED ORDER — CIPROFLOXACIN HCL 500 MG PO TABS: 500.0000 mg | ORAL_TABLET | Freq: Two times a day (BID) | ORAL | Status: DC

## 2013-01-19 NOTE — Patient Instructions (Addendum)
Diverticulitis °A diverticulum is a small pouch or sac on the colon. Diverticulosis is the presence of these diverticula on the colon. Diverticulitis is the irritation (inflammation) or infection of diverticula. °CAUSES  °The colon and its diverticula contain bacteria. If food particles block the tiny opening to a diverticulum, the bacteria inside can grow and cause an increase in pressure. This leads to infection and inflammation and is called diverticulitis. °SYMPTOMS  °· Abdominal pain and tenderness. Usually, the pain is located on the left side of your abdomen. However, it could be located elsewhere. °· Fever. °· Bloating. °· Feeling sick to your stomach (nausea). °· Throwing up (vomiting). °· Abnormal stools. °DIAGNOSIS  °Your caregiver will take a history and perform a physical exam. Since many things can cause abdominal pain, other tests may be necessary. Tests may include: °· Blood tests. °· Urine tests. °· X-ray of the abdomen. °· CT scan of the abdomen. °Sometimes, surgery is needed to determine if diverticulitis or other conditions are causing your symptoms. °TREATMENT  °Most of the time, you can be treated without surgery. Treatment includes: °· Resting the bowels by only having liquids for a few days. As you improve, you will need to eat a low-fiber diet. °· Intravenous (IV) fluids if you are losing body fluids (dehydrated). °· Antibiotic medicines that treat infections may be given. °· Pain and nausea medicine, if needed. °· Surgery if the inflamed diverticulum has burst. °HOME CARE INSTRUCTIONS  °· Try a clear liquid diet (broth, tea, or water for as long as directed by your caregiver). You may then gradually begin a low-fiber diet as tolerated. A low-fiber diet is a diet with less than 10 grams of fiber. Choose the foods below to reduce fiber in the diet: °· White breads, cereals, rice, and pasta. °· Cooked fruits and vegetables or soft fresh fruits and vegetables without the skin. °· Ground or  well-cooked tender beef, ham, veal, lamb, pork, or poultry. °· Eggs and seafood. °· After your diverticulitis symptoms have improved, your caregiver may put you on a high-fiber diet. A high-fiber diet includes 14 grams of fiber for every 1000 calories consumed. For a standard 2000 calorie diet, you would need 28 grams of fiber. Follow these diet guidelines to help you increase the fiber in your diet. It is important to slowly increase the amount fiber in your diet to avoid gas, constipation, and bloating. °· Choose whole-grain breads, cereals, pasta, and brown rice. °· Choose fresh fruits and vegetables with the skin on. Do not overcook vegetables because the more vegetables are cooked, the more fiber is lost. °· Choose more nuts, seeds, legumes, dried peas, beans, and lentils. °· Look for food products that have greater than 3 grams of fiber per serving on the Nutrition Facts label. °· Take all medicine as directed by your caregiver. °· If your caregiver has given you a follow-up appointment, it is very important that you go. Not going could result in lasting (chronic) or permanent injury, pain, and disability. If there is any problem keeping the appointment, call to reschedule. °SEEK MEDICAL CARE IF:  °· Your pain does not improve. °· You have a hard time advancing your diet beyond clear liquids. °· Your bowel movements do not return to normal. °SEEK IMMEDIATE MEDICAL CARE IF:  °· Your pain becomes worse. °· You have an oral temperature above 102° F (38.9° C), not controlled by medicine. °· You have repeated vomiting. °· You have bloody or black, tarry stools. °· Symptoms   that brought you to your caregiver become worse or are not getting better. °MAKE SURE YOU:  °· Understand these instructions. °· Will watch your condition. °· Will get help right away if you are not doing well or get worse. °Document Released: 06/04/2005 Document Revised: 11/17/2011 Document Reviewed: 09/30/2010 °ExitCare® Patient Information  ©2013 [employer]. ° °

## 2013-01-19 NOTE — Progress Notes (Signed)
52 year old diabetic who recently has taken a leave from [employer] because of uncontrolled diabetes, stress, dizzy spells and chest pain. She developed left lower quadrant pain yesterday which is typical for her past diverticulitis. She's had no diarrhea, vomiting, fever, blood in urine.  She's having a great of stress with being absent from work and her husband is out of the country and friends.  The blood sugars have been erratic and often over 300. She recently had episodes of dizziness and chest pain probably related to diabetes. We are in the process of working some of this up but she's going need extended period time. In addition, patient seen the intensive counseling for her stress.  She understands she needs to lose weight and this needs to be done gradually and continually or she will develop worsening diabetes and complications thereof. She also notes some numbness in her feet as noted in last entry. Furthermore she is microalbumin in her urine suggesting that the diabetes is starting to cause and organ damage.  Objective: Patient is crying and very distraught about her job situation, husband being out of town Chest: Normal respirations Abdomen: Mildly tender left lower cord and elbow panniculus interferes with exam. There is no mass that I feel. Skin: No rash or shingles outbreak.  Results for orders placed in visit on 01/19/13  POCT CBC      Result Value Range   WBC 10.9 (*) 4.6 - 10.2 K/uL   Lymph, poc 2.0  0.6 - 3.4   POC LYMPH PERCENT 18.4  10 - 50 %L   MID (cbc) 0.6  0 - 0.9   POC MID % 5.5  0 - 12 %M   POC Granulocyte 8.3 (*) 2 - 6.9   Granulocyte percent 76.1  37 - 80 %G   RBC 4.29  4.04 - 5.48 M/uL   Hemoglobin 11.9 (*) 12.2 - 16.2 g/dL   HCT, POC 47.8  29.5 - 47.9 %   MCV 89.1  80 - 97 fL   MCH, POC 27.7  27 - 31.2 pg   MCHC 31.2 (*) 31.8 - 35.4 g/dL   RDW, POC 62.1     Platelet Count, POC 313  142 - 424 K/uL   MPV 8.8  0 - 99.8 fL  GLUCOSE, POCT (MANUAL  RESULT ENTRY)      Result Value Range   POC Glucose 155 (*) 70 - 99 mg/dl     Assessment:  Possible diverticulitis with the elevated white blood count. Blood sugar seems to be under better control (today's value is fasting)  Plan: Cipro 500 twice a day x10 days  Signed, [name] M.D.  Dr. the loading dock

## 2013-01-24 ENCOUNTER — Telehealth: Payer: Self-pay

## 2013-01-24 NOTE — Telephone Encounter (Signed)
PT STATES [employer] WOULD LIKE TO SPEAK WITH DR KURT REGARDING HER DISABILITY. THEY REALLY NEED TO SPEAK WITH HIM AND SHE IS OUT OF [place] PLEASE CALL [phone] EXT [phone] AND HER CLAIM NUMBER IS [id number]

## 2013-01-24 NOTE — Telephone Encounter (Signed)
Please advise 

## 2013-01-26 NOTE — Telephone Encounter (Signed)
Pt called regarding her referral. [phone].

## 2013-01-26 NOTE — Telephone Encounter (Signed)
I spoke with [employer] two days ago.  They are discussing her benefit now

## 2013-01-27 ENCOUNTER — Ambulatory Visit (INDEPENDENT_AMBULATORY_CARE_PROVIDER_SITE_OTHER): Payer: 59 | Admitting: Family Medicine

## 2013-01-27 ENCOUNTER — Encounter: Payer: Self-pay | Admitting: Family Medicine

## 2013-01-27 VITALS — BP 122/77 | HR 75 | Temp 97.1°F | Resp 16 | Ht 63.5 in | Wt 337.0 lb

## 2013-01-27 DIAGNOSIS — K5732 Diverticulitis of large intestine without perforation or abscess without bleeding: Secondary | ICD-10-CM

## 2013-01-27 DIAGNOSIS — R05 Cough: Secondary | ICD-10-CM

## 2013-01-27 DIAGNOSIS — R059 Cough, unspecified: Secondary | ICD-10-CM

## 2013-01-27 DIAGNOSIS — R079 Chest pain, unspecified: Secondary | ICD-10-CM

## 2013-01-27 DIAGNOSIS — E119 Type 2 diabetes mellitus without complications: Secondary | ICD-10-CM

## 2013-01-27 NOTE — Progress Notes (Signed)
52 yo on disability from [place], losing weight and being more active.  Her LLQ pain has resolved.  Intermittent diarrhea continues.  Blood sugars vacillate between 150 and 250.   No polyuria, polydipsia  Chest pains are intermittent now, less frequent and less intense  Recent cough (dry) after cleaning the garage for an upcoming garage sale.  Had negative CT of chest.  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  Objective:  Alert, good eye  Contact  Fundi:  Normal Neck:  Supple Chest:  Clear Heart: reg, no murmur Ext:  No edema  Assessment:  Improved. I think the recent cough is probably from cleaning out the garage.  Plan:  Continue the exercise, diet controls Recheck 3 weeks.  Recheck 2 weeks

## 2013-02-11 ENCOUNTER — Other Ambulatory Visit: Payer: Self-pay | Admitting: Family Medicine

## 2013-02-17 ENCOUNTER — Ambulatory Visit (INDEPENDENT_AMBULATORY_CARE_PROVIDER_SITE_OTHER): Payer: 59 | Admitting: Family Medicine

## 2013-02-17 ENCOUNTER — Encounter: Payer: Self-pay | Admitting: Family Medicine

## 2013-02-17 VITALS — BP 127/94 | HR 64 | Temp 98.9°F | Resp 16 | Ht 64.0 in | Wt 334.0 lb

## 2013-02-17 DIAGNOSIS — E1149 Type 2 diabetes mellitus with other diabetic neurological complication: Secondary | ICD-10-CM

## 2013-02-17 DIAGNOSIS — E119 Type 2 diabetes mellitus without complications: Secondary | ICD-10-CM

## 2013-02-17 DIAGNOSIS — F0630 Mood disorder due to known physiological condition, unspecified: Secondary | ICD-10-CM

## 2013-02-17 DIAGNOSIS — E1140 Type 2 diabetes mellitus with diabetic neuropathy, unspecified: Secondary | ICD-10-CM

## 2013-02-17 DIAGNOSIS — J029 Acute pharyngitis, unspecified: Secondary | ICD-10-CM

## 2013-02-17 LAB — LIPID PANEL
Cholesterol: 190 mg/dL (ref 0–200)
HDL: 57 mg/dL (ref >39)
LDL Cholesterol: 107 mg/dL — ABNORMAL HIGH (ref 0–99)
Total CHOL/HDL Ratio: 3.3 Ratio
Triglycerides: 132 mg/dL (ref <150)
VLDL: 26 mg/dL (ref 0–40)

## 2013-02-17 LAB — COMPREHENSIVE METABOLIC PANEL
ALT: 15 U/L (ref 0–35)
AST: 16 U/L (ref 0–37)
Albumin: 4.1 g/dL (ref 3.5–5.2)
Alkaline Phosphatase: 86 U/L (ref 39–117)
BUN: 12 mg/dL (ref 6–23)
CO2: 24 mEq/L (ref 19–32)
Calcium: 9.4 mg/dL (ref 8.4–10.5)
Chloride: 103 mEq/L (ref 96–112)
Creat: 0.71 mg/dL (ref 0.50–1.10)
Glucose, Bld: 171 mg/dL — ABNORMAL HIGH (ref 70–99)
Potassium: 4 mEq/L (ref 3.5–5.3)
Sodium: 139 mEq/L (ref 135–145)
Total Bilirubin: 0.5 mg/dL (ref 0.3–1.2)
Total Protein: 6.8 g/dL (ref 6.0–8.3)

## 2013-02-17 LAB — POCT GLYCOSYLATED HEMOGLOBIN (HGB A1C): Hemoglobin A1C: 7

## 2013-02-17 MED ORDER — TRAZODONE HCL 100 MG PO TABS: 100.0000 mg | ORAL_TABLET | Freq: Every day | ORAL | Status: DC

## 2013-02-17 NOTE — Patient Instructions (Addendum)
[name], MD at 01/27/2013 10:10 AM   Status: Signed            52 yo on disability from [place], losing weight and being more active. Her LLQ pain has resolved. Intermittent diarrhea continues. Blood sugars vacillate between 150 and 250.  No polyuria, polydipsia  Chest pains are intermittent now, less frequent and less intense  Recent cough (dry) after cleaning the garage for an upcoming garage sale. Had negative CT of chest.  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  Objective: Alert, good eye Contact  Fundi: Normal  Neck: Supple  Chest: Clear  Heart: reg, no murmur  Ext: No edema  Assessment: Improved. I think the recent cough is probably from cleaning out the garage.  Plan: Continue the exercise, diet controls  Recheck 3 weeks.  Recheck 2 weeks   52 yo patient on leave from [employer] because of a series of problems:  Chest pain, weakness, diverticulitis, uncontrolled diabetes.  She has been working on her weight, trying to exercise more and being very careful about diet. FBS this am 174 despite early dinner of lean meat and vegetables.  She is having some irritability after starting the Cymbalta (which has increased energy).  Quick to anger.  Also, has been started on Trazodone at night.  Right foot is numb and tingling.  Also left foot heel is numb with paresthesias in left arch of foot.  Mild sore throat for 2 days.  Felt feverish last night.  Continues to have less intense left anterior upper medial chest pain with dyspnea on exertion.  Definitely improved in this area.  Objective: NAD HEENT:  Mild erythema on right posterior pharynx Chest: clear Heart: regular Extremities:  No edema.  Some numbness R>L  Assessment:  Blood sugars have improved, but still not where we need them.  Weight is better.  Mood continues to be problematic, but with changes in dose scheduling, this should improve.  The throat appears to be a mild viral  infection.  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  Our best shot at getting patient back to work is close follow up with weight loss and diabetic monitoring.  Plan:  Take one trazodone at 6 pm, and take 1/2 to 1 clonazepam with Cymbalta. Continue efforts on weight loss. Recheck one month.  Signed, [name], MD

## 2013-02-17 NOTE — Progress Notes (Signed)
52 yo patient on leave from [employer] because of a series of problems:  Chest pain, weakness, diverticulitis, uncontrolled diabetes.  She has been working on her weight, trying to exercise more and being very careful about diet. FBS this am 174 despite early dinner of lean meat and vegetables.  She is having some irritability after starting the Cymbalta (which has increased energy).  Quick to anger.  Also, has been started on Trazodone at night.  Right foot is numb and tingling.  Also left foot heel is numb with paresthesias in left arch of foot.  Mild sore throat for 2 days.  Felt feverish last night.  Continues to have less intense left anterior upper medial chest pain with dyspnea on exertion.  Definitely improved in this area.  Objective: NAD HEENT:  Mild erythema on right posterior pharynx Chest: clear Heart: regular Extremities:  No edema.  Some numbness R>L  Assessment:  Blood sugars have improved, but still not where we need them.  Weight is better.  Mood continues to be problematic, but with changes in dose scheduling, this should improve.  The throat appears to be a mild viral infection.  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  Our best shot at getting patient back to work is close follow up with weight loss and diabetic monitoring.  Plan:  Take one trazodone at 6 pm, and take 1/2 to 1 clonazepam with Cymbalta. Continue efforts on weight loss. Recheck one month.  Signed, [name], MD

## 2013-02-25 ENCOUNTER — Other Ambulatory Visit: Payer: Self-pay | Admitting: Physician Assistant

## 2013-03-24 ENCOUNTER — Ambulatory Visit (INDEPENDENT_AMBULATORY_CARE_PROVIDER_SITE_OTHER): Payer: 59 | Admitting: Family Medicine

## 2013-03-24 ENCOUNTER — Encounter: Payer: Self-pay | Admitting: Family Medicine

## 2013-03-24 VITALS — BP 130/64 | HR 90 | Temp 98.7°F | Resp 16 | Ht 64.5 in | Wt 335.8 lb

## 2013-03-24 DIAGNOSIS — R079 Chest pain, unspecified: Secondary | ICD-10-CM

## 2013-03-24 DIAGNOSIS — E119 Type 2 diabetes mellitus without complications: Secondary | ICD-10-CM

## 2013-03-24 DIAGNOSIS — N951 Menopausal and female climacteric states: Secondary | ICD-10-CM

## 2013-03-24 NOTE — Progress Notes (Signed)
52 yo diabetic woman with multiple episodes of unexplained chest pain, unstable gait, intermittent shortness of breath after exertion, diffuse pruritus, episodes of extreme hotness and diaphoresis. Periods are normal and regular.  She has h/o PCOS.  Her husband is active with public speaking and [employer] is managing that.  Objective:  NAD HEENT:  Geographic tongue Neck:  Supple, no adenopathy Chest:  Clear Heart:  Reg, no murmur Ext:  Feet normal with no edema and no skin breaks, good pedal pulses.  Some swelling just below knees, medially Lower extremity reflexes normal, normal rhomberg   Assessment:  Unsteady on feet which is new. Still unable to return to work because of the unpredictable nature of her chest pain, her unsteadiness, and the fluctuating nature of her diabetes  Plan:  Hot flushes, perimenopausal - Plan: Follicle Stimulating Hormone Recheck 4 weeks. Continued short term disability  Signed, [name], MD

## 2013-03-24 NOTE — Patient Instructions (Addendum)
51 yo diabetic woman with multiple episodes of unexplained chest pain, unstable gait, intermittent shortness of breath after exertion, diffuse pruritus, episodes of extreme hotness and diaphoresis. Periods are normal and regular.  She has h/o PCOS.  Her husband is active with public speaking and Alicia Lucero is managing that.  Objective:  NAD HEENT:  Geographic tongue Neck:  Supple, no adenopathy Chest:  Clear Heart:  Reg, no murmur Ext:  Feet normal with no edema and no skin breaks, good pedal pulses.  Some swelling just below knees, medially Lower extremity reflexes normal, normal rhomberg   Assessment:  Unsteady on feet which is new. Still unable to return to work because of the unpredictable nature of her chest pain, her unsteadiness, and the fluctuating nature of her diabetes  Plan:  Hot flushes, perimenopausal - Plan: Follicle Stimulating Hormone Recheck 4 weeks. Continued short term disability  Signed, Alicia Sherk, MD   

## 2013-03-25 LAB — FOLLICLE STIMULATING HORMONE: FSH: 5.6 m[IU]/mL

## 2013-04-04 ENCOUNTER — Telehealth: Payer: Self-pay

## 2013-04-04 NOTE — Telephone Encounter (Signed)
Dr Milus Glazier will be out of the office until 8/6 - will you please let the insurance company know.

## 2013-04-04 NOTE — Telephone Encounter (Signed)
[employer] needs clarification as to what is limiting patient that she can't do her job. We completed paperwork but they need clarification.  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

## 2013-04-05 NOTE — Telephone Encounter (Signed)
Please call the company with the information below.

## 2013-04-05 NOTE — Telephone Encounter (Signed)
Alicia Lucero at [employer] from Dr L. She still has questions concerning what specifically is causing her to be unable to work. She does have to walk some at work, but is considered a rather light duty job. She is looking for more info about what may be causing the chest pain, SOB and what is being done. She believes the pt suggested that anxiety might be causing some of Sxs, but if cardiac, has pt been referred to specialist or had testing done? If pt's gait is unsteady and pt is dizzy, she would like more documentation describing it and advising that this is what is causing pt to be unable to work.

## 2013-04-05 NOTE — Telephone Encounter (Signed)
Patient was having intermittent chest pain and dizziness initially, and we have been working on better control of her diabetes until these symptoms clear

## 2013-04-08 NOTE — Telephone Encounter (Signed)
Alicia Lucero had CB and left VM checking status of further info from Dr L. I called her back and LMOM reiterating that Dr L is out of the office until 04/13/13 but may check his messages and we will let her know if so, as soon as we get answers for her.

## 2013-04-11 ENCOUNTER — Telehealth: Payer: Self-pay

## 2013-04-14 ENCOUNTER — Encounter: Payer: Self-pay | Admitting: Family Medicine

## 2013-04-19 ENCOUNTER — Other Ambulatory Visit: Payer: Self-pay | Admitting: Family Medicine

## 2013-04-19 ENCOUNTER — Encounter: Payer: Self-pay | Admitting: Family Medicine

## 2013-04-19 NOTE — Telephone Encounter (Signed)
Patient needs a refill on Albuterol. States it is urgent. [facility]. [phone]

## 2013-04-22 NOTE — Telephone Encounter (Signed)
Dr L, this been completed?

## 2013-04-22 NOTE — Telephone Encounter (Signed)
I spoke with insurer who will give patient another month.  I wrote patient.  Did she receive my letter????

## 2013-04-26 NOTE — Telephone Encounter (Signed)
Called pt but her mailbox is full.

## 2013-05-05 ENCOUNTER — Encounter: Payer: Self-pay | Admitting: Family Medicine

## 2013-05-05 ENCOUNTER — Ambulatory Visit (INDEPENDENT_AMBULATORY_CARE_PROVIDER_SITE_OTHER): Payer: 59 | Admitting: Family Medicine

## 2013-05-05 ENCOUNTER — Other Ambulatory Visit: Payer: Self-pay | Admitting: Radiology

## 2013-05-05 VITALS — BP 136/76 | HR 95 | Temp 98.0°F | Resp 18 | Ht 64.0 in | Wt 331.0 lb

## 2013-05-05 DIAGNOSIS — E119 Type 2 diabetes mellitus without complications: Secondary | ICD-10-CM

## 2013-05-05 DIAGNOSIS — F32A Depression, unspecified: Secondary | ICD-10-CM

## 2013-05-05 DIAGNOSIS — R269 Unspecified abnormalities of gait and mobility: Secondary | ICD-10-CM

## 2013-05-05 DIAGNOSIS — R2681 Unsteadiness on feet: Secondary | ICD-10-CM

## 2013-05-05 DIAGNOSIS — F329 Major depressive disorder, single episode, unspecified: Secondary | ICD-10-CM

## 2013-05-05 DIAGNOSIS — R079 Chest pain, unspecified: Secondary | ICD-10-CM

## 2013-05-05 LAB — COMPREHENSIVE METABOLIC PANEL
ALT: 15 U/L (ref 0–35)
AST: 13 U/L (ref 0–37)
Albumin: 3.9 g/dL (ref 3.5–5.2)
Alkaline Phosphatase: 82 U/L (ref 39–117)
BUN: 16 mg/dL (ref 6–23)
CO2: 26 mEq/L (ref 19–32)
Calcium: 9.3 mg/dL (ref 8.4–10.5)
Chloride: 103 mEq/L (ref 96–112)
Creat: 0.85 mg/dL (ref 0.50–1.10)
Glucose, Bld: 198 mg/dL — ABNORMAL HIGH (ref 70–99)
Potassium: 4.4 mEq/L (ref 3.5–5.3)
Sodium: 138 mEq/L (ref 135–145)
Total Bilirubin: 0.3 mg/dL (ref 0.3–1.2)
Total Protein: 6.6 g/dL (ref 6.0–8.3)

## 2013-05-05 LAB — POCT GLYCOSYLATED HEMOGLOBIN (HGB A1C): Hemoglobin A1C: 6.9

## 2013-05-05 MED ORDER — INSULIN DETEMIR 100 UNIT/ML FLEXPEN: 38.0000 [IU] | PEN_INJECTOR | Freq: Every day | SUBCUTANEOUS | Status: DC

## 2013-05-05 MED ORDER — OMEPRAZOLE 40 MG PO CPDR: 40.0000 mg | DELAYED_RELEASE_CAPSULE | Freq: Every day | ORAL | Status: DC

## 2013-05-05 NOTE — Progress Notes (Signed)
52 yo depressed woman who works at [employer] but has been OOW for 4 months.  She still has occasional chest pains, now has some left scapular sharp pain, now has an unsteady gait and is having some problems with speech.  Her depression is much worse.  She is not eating much, but the thought of food makes her nauseated.  Sept 11 is date of her father's death.  She recently saw Dr. Saul Fordyce who changed Lamictal to bid.  Objective:  NAD Chest clear, tender left paraspinal area medial to scapula Heart: reg, no murmur Gait:  Unsteady Extrem:  Mild infrapatellar swelling, no pedal edema Skin:  Clear   Assessment:  Still not ready to return to work because of gait, new speech problem, depression/anxiety and continued chest symptoms.  On a positive note, patient has continued to lose weight.  Plan:  Physical therapy, neurology consult Check on saliva test for antidepressants.  Mosie Epstein, MD

## 2013-05-06 ENCOUNTER — Telehealth: Payer: Self-pay

## 2013-05-06 NOTE — Telephone Encounter (Signed)
Patient's disability forms from [employer] are in Dr. Loma Boston box to be completed.

## 2013-05-10 ENCOUNTER — Telehealth: Payer: Self-pay

## 2013-05-10 MED ORDER — INSULIN DETEMIR 100 UNIT/ML FLEXPEN: 36.0000 [IU] | PEN_INJECTOR | Freq: Every day | SUBCUTANEOUS | Status: DC

## 2013-05-10 NOTE — Telephone Encounter (Signed)
[facility] called to clarify whether pt's Rx for insulin was for 38 units or 36 units QD because both directions were on sig. Delaney Meigs had discussed this with Dr Neva Seat and it was decided that since the directions for 36 units was written after the 38, and the pt's A1C was very good, Dr Cain Saupe intention was to have her take the 36 units QD. I gave the pharm this info and will change in EPIC to correct sig. Dr L, I am forwarding this to you FYI to review.

## 2013-05-11 ENCOUNTER — Other Ambulatory Visit: Payer: Self-pay | Admitting: *Deleted

## 2013-05-11 MED ORDER — INSULIN DETEMIR 100 UNIT/ML FLEXPEN: 36.0000 [IU] | PEN_INJECTOR | Freq: Every day | SUBCUTANEOUS | Status: DC

## 2013-05-11 NOTE — Telephone Encounter (Signed)
Pt notified that rx was fixed and pharmacy was notified.  Pt  Now using Levemir Flex touch Pen 36 units daily.

## 2013-05-11 NOTE — Telephone Encounter (Signed)
36 units

## 2013-05-12 ENCOUNTER — Encounter: Payer: Self-pay | Admitting: Family Medicine

## 2013-05-12 NOTE — Telephone Encounter (Signed)
Per Dr. Elbert Ewings I completed forms as done in April, faxed to [facility], and scanned into chart.

## 2013-05-16 ENCOUNTER — Ambulatory Visit (INDEPENDENT_AMBULATORY_CARE_PROVIDER_SITE_OTHER): Payer: 59 | Admitting: Diagnostic Neuroimaging

## 2013-05-16 ENCOUNTER — Telehealth: Payer: Self-pay

## 2013-05-16 ENCOUNTER — Encounter: Payer: Self-pay | Admitting: Diagnostic Neuroimaging

## 2013-05-16 VITALS — BP 128/78 | HR 84 | Ht 64.0 in | Wt 329.5 lb

## 2013-05-16 DIAGNOSIS — E1140 Type 2 diabetes mellitus with diabetic neuropathy, unspecified: Secondary | ICD-10-CM

## 2013-05-16 DIAGNOSIS — E1142 Type 2 diabetes mellitus with diabetic polyneuropathy: Secondary | ICD-10-CM

## 2013-05-16 DIAGNOSIS — R413 Other amnesia: Secondary | ICD-10-CM

## 2013-05-16 DIAGNOSIS — E1149 Type 2 diabetes mellitus with other diabetic neurological complication: Secondary | ICD-10-CM

## 2013-05-16 DIAGNOSIS — R269 Unspecified abnormalities of gait and mobility: Secondary | ICD-10-CM

## 2013-05-16 DIAGNOSIS — F329 Major depressive disorder, single episode, unspecified: Secondary | ICD-10-CM

## 2013-05-16 NOTE — Patient Instructions (Signed)
I will check MRI, B12 level and refer to neuropsych testing.

## 2013-05-16 NOTE — Progress Notes (Signed)
[facility]  PATIENT: Alicia Lucero DOB: [birth year]  REFERRING CLINICIAN: Lauenstein HISTORY FROM: patient REASON FOR VISIT: new consult   HISTORICAL  CHIEF COMPLAINT:  Chief Complaint  Patient presents with  . Gait Problem  . Memory Loss    HISTORY OF PRESENT ILLNESS:   51 year old female with diabetes, depression, anxiety, here for evaluation of memory problems, confusion, balance difficulty, headaches, dizziness.   Patient has long history of depression anxiety since 1983. She's under care of [facility] ([facility]).   The past several years patient has had progressive confusion and memory problems as well as balance difficulty. They seem to significantly worsened around April 2014 when she was hospitalized for chest pain. Since that time she's had more problems with word finding difficulties, short-term memory problems, balance and stumbling problems, dizziness and headaches.   Patient's sister has multiple sclerosis, and patient is concerned about similar diagnosis for herself.  In 2012 patient had MRI of the brain, with indication of "Confusion and ataxia. Difficulty forming words over  the last 6 weeks" and was normal.  REVIEW OF SYSTEMS: Full 14 system review of systems performed and notable only for weight loss chest pain blurred vision shortness of breath memory loss confusion headache slurred speech dizziness insomnia depression anxiety racing thoughts.  ALLERGIES: Allergies  Allergen Reactions  . Other Nausea Only    States she can't tolerate seafood smell    HOME MEDICATIONS: Prior to Admission medications   Medication Sig Start Date End Date Taking? Authorizing Provider  ACCU-CHEK AVIVA PLUS test strip USE AS DIRECTED 01/19/13  Yes [name], MD  aspirin EC 81 MG EC tablet Take 1 tablet (81 mg total) by mouth daily. 12/11/12  Yes Penny Pia, MD  B Complex-C (B-COMPLEX WITH VITAMIN C) tablet Take 1 tablet by mouth  daily.   Yes Historical Provider, MD  clonazePAM (KLONOPIN) 2 MG tablet Take 2 mg by mouth 3 (three) times daily as needed for anxiety.    Yes Historical Provider, MD  DULoxetine (CYMBALTA) 60 MG capsule Take 60 mg by mouth daily.   Yes Historical Provider, MD  gabapentin (NEURONTIN) 300 MG capsule Take 600 mg by mouth 3 (three) times daily as needed (for anxiety).   Yes Historical Provider, MD  Insulin Detemir (LEVEMIR FLEXTOUCH) 100 UNIT/ML SOPN Inject 36 Units into the skin daily. 05/11/13  Yes Shade Flood, MD  ketoconazole (NIZORAL) 200 MG tablet Take 1 tablet (200 mg total) by mouth daily. 12/06/12  Yes [name], MD  lamoTRIgine (LAMICTAL) 200 MG tablet Take 400 mg by mouth every evening.    Yes Historical Provider, MD  Multiple Vitamins-Minerals (WOMENS MULTIVITAMIN PLUS PO) Take 1 tablet by mouth daily.    Yes Historical Provider, MD  naproxen sodium (ANAPROX) 220 MG tablet Take 220 mg by mouth 2 (two) times daily as needed (for pain).   Yes Historical Provider, MD  nystatin (MYCOSTATIN) powder Apply topically 4 (four) times daily. 12/22/12  Yes Chelle S Jeffery, PA-C  Omega-3 Fatty Acids (FISH OIL PO) Take 2 capsules by mouth 2 (two) times daily.   Yes Historical Provider, MD  omeprazole (PRILOSEC) 40 MG capsule Take 1 capsule (40 mg total) by mouth daily. 05/05/13  Yes [name], MD  PROAIR HFA 108 [phone] BASE) MCG/ACT inhaler inhale 2 puffs INTO THE LUNGS every 6 hours if needed for wheezing 04/19/13  Yes [name], MD  sertraline (ZOLOFT) 100 MG tablet Take 200 mg by mouth daily.  Yes Historical Provider, MD  sitaGLIPtan-metformin (JANUMET) 50-1000 MG per tablet Take 1 tablet by mouth 2 (two) times daily with a meal. 06/17/12  Yes [name], MD  traZODone (DESYREL) 100 MG tablet Take 1 tablet (100 mg total) by mouth at bedtime. 02/17/13  Yes [name], MD   Outpatient Prescriptions Prior to Visit  Medication Sig Dispense Refill  . ACCU-CHEK AVIVA PLUS test  strip USE AS DIRECTED  100 each  11  . aspirin EC 81 MG EC tablet Take 1 tablet (81 mg total) by mouth daily.  30 tablet  0  . B Complex-C (B-COMPLEX WITH VITAMIN C) tablet Take 1 tablet by mouth daily.      . clonazePAM (KLONOPIN) 2 MG tablet Take 2 mg by mouth 3 (three) times daily as needed for anxiety.       . DULoxetine (CYMBALTA) 60 MG capsule Take 60 mg by mouth daily.      Marland Kitchen gabapentin (NEURONTIN) 300 MG capsule Take 600 mg by mouth 3 (three) times daily as needed (for anxiety).      . Insulin Detemir (LEVEMIR FLEXTOUCH) 100 UNIT/ML SOPN Inject 36 Units into the skin daily.  15 mL  5  . ketoconazole (NIZORAL) 200 MG tablet Take 1 tablet (200 mg total) by mouth daily.  10 tablet  0  . lamoTRIgine (LAMICTAL) 200 MG tablet Take 400 mg by mouth every evening.       . Multiple Vitamins-Minerals (WOMENS MULTIVITAMIN PLUS PO) Take 1 tablet by mouth daily.       . naproxen sodium (ANAPROX) 220 MG tablet Take 220 mg by mouth 2 (two) times daily as needed (for pain).      . nystatin (MYCOSTATIN) powder Apply topically 4 (four) times daily.  60 g  1  . Omega-3 Fatty Acids (FISH OIL PO) Take 2 capsules by mouth 2 (two) times daily.      Marland Kitchen omeprazole (PRILOSEC) 40 MG capsule Take 1 capsule (40 mg total) by mouth daily.  30 capsule  11  . PROAIR HFA 108 (90 BASE) MCG/ACT inhaler inhale 2 puffs INTO THE LUNGS every 6 hours if needed for wheezing  8.5 g  3  . sertraline (ZOLOFT) 100 MG tablet Take 200 mg by mouth daily.        . sitaGLIPtan-metformin (JANUMET) 50-1000 MG per tablet Take 1 tablet by mouth 2 (two) times daily with a meal.  60 tablet  11  . traZODone (DESYREL) 100 MG tablet Take 1 tablet (100 mg total) by mouth at bedtime.  30 tablet  3   No facility-administered medications prior to visit.    PAST MEDICAL HISTORY: Past Medical History  Diagnosis Date  . Depression   . GERD (gastroesophageal reflux disease)   . Anxiety   . Diabetes mellitus without complication     Type 2 insulin  dependent  . Bipolar 1 disorder   . PCOS (polycystic ovarian syndrome)     PAST SURGICAL HISTORY: Past Surgical History  Procedure Laterality Date  . Cholecystectomy  1996  . Tonsillectomy      as a child  . Knee arthroscopy Left 2008  . Dilation and curettage of uterus      secondary to menorrhagia  . Wisdom tooth extraction      FAMILY HISTORY: Family History  Problem Relation Age of Onset  . Cancer Mother     lung  . Hypertension Mother   . Cancer Father     liver  .  Hypertension Father   . Heart disease Father     CVD  . Multiple sclerosis Sister   . Hypertension Sister     SOCIAL HISTORY:  History   Social History  . Marital Status: Married    Spouse Name: [name]    Number of Children: 0  . Years of Education: Assoc.   Occupational History  .  [employer]   Social History Main Topics  . Smoking status: Former Smoker    Types: Cigarettes    Quit date: 06/30/2005  . Smokeless tobacco: Not on file     Comment: electronic cigarettes PRN  . Alcohol Use: No     Comment: quit: 2007  . Drug Use: Yes     Comment: occasional  . Sexual Activity: Not on file   Other Topics Concern  . Not on file   Social History Narrative   Patient lives at home with her spouse.   Caffeine Use: 5-7 caffeine drinks daily     PHYSICAL EXAM  Filed Vitals:   05/16/13 0820  BP: 128/78  Pulse: 84  Height: 5\' 4"  (1.626 m)  Weight: 329 lb 8 oz (149.46 kg)    Not recorded    Body mass index is 56.53 kg/(m^2).  GENERAL EXAM: Patient is in no distress  CARDIOVASCULAR: Regular rate and rhythm, no murmurs, no carotid bruits  NEUROLOGIC: MENTAL STATUS: awake, alert, language fluent, comprehension intact, naming intact; MMSE 24/30  (MISSES 1 FOR DATE, 4 FOR SERIAL 7'S, 1 FOR PENTAGON). AFT 16. CLOCK NORMAL. GDS 11. CRANIAL NERVE: no papilledema on fundoscopic exam, pupils equal and reactive to light, visual fields full to confrontation, extraocular muscles intact,  no nystagmus, facial sensation and strength symmetric, uvula midline, shoulder shrug symmetric, tongue midline. MOTOR: normal bulk and tone, full strength in the BUE, BLE SENSORY: normal and symmetric to light touch; RIGHT TOE VIB < 5 SEC, LEFT TOE VIB ABSENT.  COORDINATION: finger-nose-finger, fine finger movements normal REFLEXES: deep tendon reflexes present and symmetric GAIT/STATION: narrow based gait; CANNOT WALK TOE, HEEL OR TANDEM. Romberg is negative. 25 FOOT WALK TEST TRIALS (10s, 11s)   DIAGNOSTIC DATA (LABS, IMAGING, TESTING) - I reviewed patient records, labs, notes, testing and imaging myself where available.  Lab Results  Component Value Date   WBC 10.9* 01/19/2013   HGB 11.9* 01/19/2013   HCT 38.2 01/19/2013   MCV 89.1 01/19/2013   PLT 262 12/11/2012      Component Value Date/Time   NA 138 05/05/2013 0928   K 4.4 05/05/2013 0928   CL 103 05/05/2013 0928   CO2 26 05/05/2013 0928   GLUCOSE 198* 05/05/2013 0928   BUN 16 05/05/2013 0928   CREATININE 0.85 05/05/2013 0928   CREATININE 0.99 12/11/2012 0700   CALCIUM 9.3 05/05/2013 0928   PROT 6.6 05/05/2013 0928   ALBUMIN 3.9 05/05/2013 0928   AST 13 05/05/2013 0928   ALT 15 05/05/2013 0928   ALKPHOS 82 05/05/2013 0928   BILITOT 0.3 05/05/2013 0928   GFRNONAA 66* 12/11/2012 0700   GFRAA 76* 12/11/2012 0700   Lab Results  Component Value Date   CHOL 190 02/17/2013   HDL 57 02/17/2013   LDLCALC 107* 02/17/2013   TRIG 132 02/17/2013   CHOLHDL 3.3 02/17/2013   Lab Results  Component Value Date   HGBA1C 6.9 05/05/2013   No results found for this basename: [id number]   Lab Results  Component Value Date   TSH 3.851 12/10/2012    06/06/11 MRI brain  1. Normal MRI brain.  2. The left sphenoid sinus polyp or mucous retention cyst.  3. 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   ASSESSMENT AND PLAN  52 y.o. year old female here with constellation of symptoms including confusion, disorientation, balance difficulty, word  finding problems, memory issues, balance and dizziness problems, headaches. No clear unifying diagnosis. Contributing factors include depression, anxiety, insomnia, obesity, diabetic neuropathy, deconditioning.  MMSE 24 (MISSES 1 FOR DATE, 4 FOR SERIAL 7'S, 1 FOR PENTAGON) ANIMAL FLUENCY TEST 16 GERIATIC DEPRESSION SCALE 11   PLAN: Orders Placed This Encounter  Procedures  . MR Brain Wo Contrast  . Vitamin B12  . Ambulatory referral to Neuropsychology   Return in about 6 months (around 11/13/2013) for with Heide Guile or Marianna Cid.    Suanne Marker, MD 05/16/2013, 8:54 AM Certified in Neurology, Neurophysiology and Neuroimaging  [facility] [address] [place], [place] [address] [phone]

## 2013-05-16 NOTE — Telephone Encounter (Signed)
FOR DR Kenyon Ana PT STATES SHE NEED YOU TO CALL HER DISABILITY PLACE LIKE YOU DID BEFORE SO SHE CAN GET A PAY CHECK. PLEASE CALL [phone]

## 2013-05-17 NOTE — Telephone Encounter (Signed)
Dr L 

## 2013-05-19 NOTE — Telephone Encounter (Signed)
Please call Alicia Lucero at [phone] and explained that patient does continue to have memory problems, disorientation, unstable gait, and fluctuating blood sugars. She's undergoing a complete neurological evaluation with MRI and neuropsych studies. This will take remainder of the month complete. Until that time she is completely disabled.  Patient's claim number is [id number]

## 2013-05-25 ENCOUNTER — Telehealth: Payer: Self-pay

## 2013-05-25 NOTE — Telephone Encounter (Signed)
Additional disability paperwork in Dr. Cain Saupe box.

## 2013-05-28 ENCOUNTER — Ambulatory Visit
Admission: RE | Admit: 2013-05-28 | Discharge: 2013-05-28 | Disposition: A | Discharge status: Home / Self Care | Payer: 59 | Source: Ambulatory Visit | Attending: Diagnostic Neuroimaging | Admitting: Diagnostic Neuroimaging

## 2013-05-28 DIAGNOSIS — E1140 Type 2 diabetes mellitus with diabetic neuropathy, unspecified: Secondary | ICD-10-CM

## 2013-05-28 DIAGNOSIS — R269 Unspecified abnormalities of gait and mobility: Secondary | ICD-10-CM

## 2013-05-28 DIAGNOSIS — R413 Other amnesia: Secondary | ICD-10-CM

## 2013-05-28 DIAGNOSIS — F329 Major depressive disorder, single episode, unspecified: Secondary | ICD-10-CM

## 2013-05-28 DIAGNOSIS — R42 Dizziness and giddiness: Secondary | ICD-10-CM

## 2013-06-09 ENCOUNTER — Encounter: Payer: Self-pay | Admitting: Family Medicine

## 2013-06-09 ENCOUNTER — Ambulatory Visit (INDEPENDENT_AMBULATORY_CARE_PROVIDER_SITE_OTHER): Payer: 59 | Admitting: Family Medicine

## 2013-06-09 ENCOUNTER — Telehealth: Payer: Self-pay

## 2013-06-09 VITALS — BP 130/82 | HR 77 | Temp 98.1°F | Resp 18 | Ht 64.0 in | Wt 322.2 lb

## 2013-06-09 DIAGNOSIS — E119 Type 2 diabetes mellitus without complications: Secondary | ICD-10-CM

## 2013-06-09 DIAGNOSIS — R079 Chest pain, unspecified: Secondary | ICD-10-CM

## 2013-06-09 DIAGNOSIS — Z23 Encounter for immunization: Secondary | ICD-10-CM

## 2013-06-09 MED ORDER — METFORMIN HCL 1000 MG PO TABS: 1000.0000 mg | ORAL_TABLET | Freq: Two times a day (BID) | ORAL | Status: DC

## 2013-06-09 NOTE — Telephone Encounter (Signed)
Pt was seen by dr Milus Glazier today and states he was going to submit rx for her.  Pt wanted to advise she has a new pharmacy, she is now using [facility] [address]

## 2013-06-09 NOTE — Telephone Encounter (Signed)
They were sent to [facility] called her to advise.

## 2013-06-09 NOTE — Progress Notes (Signed)
52 yo morbidly obese woman with diabetes who has lost more weight again.  Short term disability runs out tomorrow.  Still working on psychological issues with Saul Fordyce.  Some chest pains left chest, usually occurs when upset.  Feet are better as is balance, paresthesias have cleared.  Neurontin is very sedating.  Less confusion and dysarthria after discontinuing the neurontin.  Walking more.  Objective:  Alert Neck:  Supple, no thyromegaly Chest:  Clear Heart:  Regular, no murmur Ext:  No edema  Assessment: overall, a major improvement.  Probably no longer qualifies for disability.  Plan: switch from Janumet to metformin 1000 bid. Continue the weight loss efforts.  Signed,  [name], MD

## 2013-07-04 ENCOUNTER — Ambulatory Visit (INDEPENDENT_AMBULATORY_CARE_PROVIDER_SITE_OTHER): Payer: 59 | Admitting: Family Medicine

## 2013-07-04 VITALS — BP 132/74 | HR 86 | Temp 98.5°F | Resp 16 | Ht 64.0 in | Wt 326.0 lb

## 2013-07-04 DIAGNOSIS — K5900 Constipation, unspecified: Secondary | ICD-10-CM

## 2013-07-04 DIAGNOSIS — J029 Acute pharyngitis, unspecified: Secondary | ICD-10-CM

## 2013-07-04 DIAGNOSIS — K625 Hemorrhage of anus and rectum: Secondary | ICD-10-CM

## 2013-07-04 MED ORDER — HYDROCODONE-ACETAMINOPHEN 5-325 MG PO TABS: 1.0000 | ORAL_TABLET | Freq: Four times a day (QID) | ORAL | Status: DC | PRN

## 2013-07-04 MED ORDER — AMOXICILLIN 875 MG PO TABS: 875.0000 mg | ORAL_TABLET | Freq: Two times a day (BID) | ORAL | Status: DC

## 2013-07-04 NOTE — Progress Notes (Signed)
Patient ID: Alicia Lucero MRN: [id number], DOB: [birth year], 52 y.o. Date of Encounter: 07/04/2013, 8:08 AM  Primary Physician: [name], MD  Chief Complaint:  Chief Complaint  Patient presents with  . Sore Throat    x 3 days   . Otalgia  . Constipation    3-5 days     HPI: 52 y.o. year old female presents with day history of sore throat. Subjective fever and chills. No cough, congestion, rhinorrhea, sinus pressure, otalgia, or headache. Normal hearing.  Able to swallow saliva, but hurts to do so. Decreased appetite secondary to sore throat.  Some night sweats and no response to a variety of NSAID's.  Patient has been constipated for a week.  One episode of BRBPR and rectal pain.  No N, V.  Took miralax and metamucil  Past Medical History  Diagnosis Date  . Depression   . GERD (gastroesophageal reflux disease)   . Anxiety   . 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 Bipolar 1 disorder   . PCOS (polycystic ovarian syndrome)      Home Meds: Prior to Admission medications   Medication Sig Start Date End Date Taking? Authorizing Provider  ACCU-CHEK AVIVA PLUS test strip USE AS DIRECTED 01/19/13  Yes [name], MD  aspirin EC 81 MG EC tablet Take 1 tablet (81 mg total) by mouth daily. 12/11/12  Yes Penny Pia, MD  B Complex-C (B-COMPLEX WITH VITAMIN C) tablet Take 1 tablet by mouth daily.   Yes Historical Provider, MD  clonazePAM (KLONOPIN) 2 MG tablet Take 2 mg by mouth 3 (three) times daily as needed for anxiety.    Yes Historical Provider, MD  gabapentin (NEURONTIN) 300 MG capsule Take 600 mg by mouth 3 (three) times daily as needed (for anxiety).   Yes Historical Provider, MD  Insulin Detemir (LEVEMIR FLEXTOUCH) 100 UNIT/ML SOPN Inject 36 Units into the skin daily. 05/11/13  Yes Shade Flood, MD  lamoTRIgine (LAMICTAL) 200 MG tablet Take 400 mg by mouth every evening.    Yes Historical Provider, MD  metFORMIN (GLUCOPHAGE) 1000  MG tablet Take 1 tablet (1,000 mg total) by mouth 2 (two) times daily with a meal. 06/09/13  Yes [name], MD  Multiple Vitamins-Minerals (WOMENS MULTIVITAMIN PLUS PO) Take 1 tablet by mouth daily.    Yes Historical Provider, MD  naproxen sodium (ANAPROX) 220 MG tablet Take 220 mg by mouth 2 (two) times daily as needed (for pain).   Yes Historical Provider, MD  Omega-3 Fatty Acids (FISH OIL PO) Take 2 capsules by mouth 2 (two) times daily.   Yes Historical Provider, MD  omeprazole (PRILOSEC) 40 MG capsule Take 1 capsule (40 mg total) by mouth daily. 05/05/13  Yes [name], MD  PROAIR HFA 108 [phone] BASE) MCG/ACT inhaler inhale 2 puffs INTO THE LUNGS every 6 hours if needed for wheezing 04/19/13  Yes [name], MD  sertraline (ZOLOFT) 100 MG tablet Take 200 mg by mouth daily.     Yes Historical Provider, MD  traZODone (DESYREL) 100 MG tablet Take 1 tablet (100 mg total) by mouth at bedtime. 02/17/13  Yes [name], MD  amoxicillin (AMOXIL) 875 MG tablet Take 1 tablet (875 mg total) by mouth 2 (two) times daily. 07/04/13   [name], MD  DULoxetine (CYMBALTA) 60 MG capsule Take 60 mg by mouth daily.    Historical Provider, MD  HYDROcodone-acetaminophen (NORCO) 5-325 MG per tablet Take 1 tablet  by mouth every 6 (six) hours as needed for pain. 07/04/13   [name], MD    Allergies:  Allergies  Allergen Reactions  . Other Nausea Only    States she can't tolerate seafood smell    History   Social History  . Marital Status: Married    Spouse Name: [name]    Number of Children: 0  . Years of Education: Assoc.   Occupational History  .  [employer]   Social History Main Topics  . Smoking status: Former Smoker    Types: Cigarettes    Quit date: 06/30/2005  . Smokeless tobacco: Not on file     Comment: electronic cigarettes PRN  . Alcohol Use: No     Comment: quit: 2007  . Drug Use: Yes     Comment: occasional  . Sexual Activity: Not on file   Other  Topics Concern  . Not on file   Social History Narrative   Patient lives at home with her spouse.   Caffeine Use: 5-7 caffeine drinks daily     Review of Systems: Constitutional: negative for chills, fever, night sweats or weight changes HEENT: see above Cardiovascular: negative for chest pain or palpitations Respiratory: negative for hemoptysis, wheezing, or shortness of breath Abdominal: negative for abdominal pain, nausea, vomiting or diarrhea.  Positive for a week of constipation and one episode of BRBPR Dermatological: negative for rash Neurologic: negative for headache   Physical Exam: Blood pressure 132/74, pulse 86, temperature 98.5 F (36.9 C), temperature source Oral, resp. rate 16, height 5\' 4"  (1.626 m), weight 326 lb (147.873 kg), last menstrual period 06/02/2013, SpO2 96.00%., Body mass index is 55.93 kg/(m^2). General: Well developed, well nourished, in no acute distress. Head: Normocephalic, atraumatic, eyes without discharge, sclera non-icteric, nares are patent. Bilateral auditory canals clear, TM's are without perforation, pearly grey with reflective cone of light bilaterally. No sinus TTP. Oral cavity moist, dentition normal. Posterior pharynx with post nasal drip and mild erythema. No peritonsillar abscess or tonsillar exudate. Neck: Supple. No thyromegaly. Full ROM. Large left swollen anterior cervical gland Lungs: Clear bilaterally to auscultation without wheezes, rales, or rhonchi. Breathing is unlabored. Heart: RRR with S1 S2. No murmurs, rubs, or gallops appreciated. Abdomen: Soft, non-tender, non-distended with normoactive bowel sounds. No hepatomegaly. No rebound/guarding. No obvious abdominal masses. Msk:  Strength and tone normal for age. Extremities: No clubbing or cyanosis. No edema. Neuro: Alert and oriented X 3. Moves all extremities spontaneously. CNII-XII grossly in tact. Psych:  Responds to questions appropriately with a normal affect.      ASSESSMENT AND PLAN:  52 y.o. year old female with pharyngitis Unspecified constipation - Plan: Ambulatory referral to Gastroenterology  BRBPR (bright red blood per rectum) - Plan: Ambulatory referral to Gastroenterology  Acute pharyngitis - Plan: amoxicillin (AMOXIL) 875 MG tablet, HYDROcodone-acetaminophen (NORCO) 5-325 MG per tablet   - -Tylenol/Motrin prn -Rest/fluids -RTC precautions -RTC 3-5 days if no improvement  Signed, [name], MD 07/04/2013 8:08 AM

## 2013-07-04 NOTE — Patient Instructions (Signed)
Sore Throat A sore throat is pain, burning, irritation, or scratchiness of the throat. There is often pain or tenderness when swallowing or talking. A sore throat may be accompanied by other symptoms, such as coughing, sneezing, fever, and swollen neck glands. A sore throat is often the first sign of another sickness, such as a cold, flu, strep throat, or mononucleosis (commonly known as mono). Most sore throats go away without medical treatment. CAUSES  The most common causes of a sore throat include:  A viral infection, such as a cold, flu, or mono.  A bacterial infection, such as strep throat, tonsillitis, or whooping cough.  Seasonal allergies.  Dryness in the air.  Irritants, such as smoke or pollution.  Gastroesophageal reflux disease (GERD). HOME CARE INSTRUCTIONS   Only take over-the-counter medicines as directed by your caregiver.  Drink enough fluids to keep your urine clear or pale yellow.  Rest as needed.  Try using throat sprays, lozenges, or sucking on hard candy to ease any pain (if older than 4 years or as directed).  Sip warm liquids, such as broth, herbal tea, or warm water with honey to relieve pain temporarily. You may also eat or drink cold or frozen liquids such as frozen ice pops.  Gargle with salt water (mix 1 tsp salt with 8 oz of water).  Do not smoke and avoid secondhand smoke.  Put a cool-mist humidifier in your bedroom at night to moisten the air. You can also turn on a hot shower and sit in the bathroom with the door closed for 5 10 minutes. SEEK IMMEDIATE MEDICAL CARE IF:  You have difficulty breathing.  You are unable to swallow fluids, soft foods, or your saliva.  You have increased swelling in the throat.  Your sore throat does not get better in 7 days.  You have nausea and vomiting.  You have a fever or persistent symptoms for more than 2 3 days.  You have a fever and your symptoms suddenly get worse. MAKE SURE YOU:   Understand  these instructions.  Will watch your condition.  Will get help right away if you are not doing well or get worse. Document Released: 10/02/2004 Document Revised: 08/11/2012 Document Reviewed: 05/02/2012 [employer] Patient Information 2014 [employer].  

## 2013-07-06 LAB — CULTURE, GROUP A STREP: Organism ID, Bacteria: NORMAL

## 2013-07-08 ENCOUNTER — Telehealth: Payer: Self-pay

## 2013-07-08 NOTE — Telephone Encounter (Signed)
Request for more information about patient's disability claim from Maureen Ralphs., case manager at [place] [employer], is in Dr. Loma Boston box to be signed. We have already sent all the OV notes, so I am not sure what they are requesting.

## 2013-07-14 ENCOUNTER — Encounter: Payer: Self-pay | Admitting: Family Medicine

## 2013-07-14 ENCOUNTER — Ambulatory Visit (INDEPENDENT_AMBULATORY_CARE_PROVIDER_SITE_OTHER): Payer: 59 | Admitting: Family Medicine

## 2013-07-14 VITALS — BP 124/82 | HR 90 | Temp 98.2°F | Resp 16 | Ht 64.0 in | Wt 324.2 lb

## 2013-07-14 DIAGNOSIS — E119 Type 2 diabetes mellitus without complications: Secondary | ICD-10-CM

## 2013-07-14 DIAGNOSIS — R079 Chest pain, unspecified: Secondary | ICD-10-CM

## 2013-07-14 NOTE — Progress Notes (Signed)
52 yo woman with diabetes.  She has been disabled from chest pain and dizziness for several months.   30 units Levemir, 1000 mg metformin bid.   Sugars are running under 150.  Saul Fordyce prescribes clonazepam, lamictal, zoloft, trazodone and gabapentin.  She may not be covered when insurance from [place] stops.  Walking with Bud  Objective: NAD Oroph:  clear Chest:  Clear Heart:  Reg, no murmur Neck: supple, amall left anterior adenopathy  Assessment: stable, our goal is BS of 150 and below. The throat pain has resolved  Plan:  Cut Levemir by 2 units daily each week. Patient has agreed to continue her exercise with walks daily. She's going to make a concerted effort to lose down below 300 pounds. Time spent face-to-face: 30 minute   Signed, [name], MD

## 2013-07-29 ENCOUNTER — Encounter: Payer: Self-pay | Admitting: Family Medicine

## 2013-08-15 ENCOUNTER — Encounter: Payer: Self-pay | Admitting: Family Medicine

## 2013-08-18 ENCOUNTER — Ambulatory Visit: Payer: 59 | Admitting: Family Medicine

## 2013-08-18 ENCOUNTER — Telehealth: Payer: Self-pay | Admitting: Family Medicine

## 2013-08-18 DIAGNOSIS — E119 Type 2 diabetes mellitus without complications: Secondary | ICD-10-CM

## 2013-08-18 MED ORDER — GLUCOSE BLOOD VI STRP: 1.0000 | ORAL_STRIP | Status: DC | PRN

## 2013-08-18 NOTE — Telephone Encounter (Signed)
x

## 2013-08-18 NOTE — Progress Notes (Signed)
x

## 2013-09-17 ENCOUNTER — Telehealth: Payer: Self-pay

## 2013-09-17 NOTE — Telephone Encounter (Signed)
PT WANTS TO TALK DIRECTLY WITH DR Milus GlazierLAUENSTEIN. SHE STATES SHE HAS A UTI AND SHE WANTS HIM TO CALL IN AN RX

## 2013-09-18 ENCOUNTER — Other Ambulatory Visit: Payer: Self-pay | Admitting: Family Medicine

## 2013-09-18 DIAGNOSIS — R300 Dysuria: Secondary | ICD-10-CM

## 2013-09-18 MED ORDER — CIPROFLOXACIN HCL 250 MG PO TABS
250.0000 mg | ORAL_TABLET | Freq: Two times a day (BID) | ORAL | Status: DC
Start: 2013-09-18 — End: 2013-11-17

## 2013-09-21 ENCOUNTER — Ambulatory Visit (INDEPENDENT_AMBULATORY_CARE_PROVIDER_SITE_OTHER): Payer: No Typology Code available for payment source | Admitting: Family Medicine

## 2013-09-21 ENCOUNTER — Encounter: Payer: Self-pay | Admitting: Family Medicine

## 2013-09-21 VITALS — BP 120/74 | HR 97 | Temp 98.8°F | Resp 16 | Ht 64.0 in | Wt 329.2 lb

## 2013-09-21 DIAGNOSIS — M79675 Pain in left toe(s): Secondary | ICD-10-CM

## 2013-09-21 DIAGNOSIS — M79609 Pain in unspecified limb: Secondary | ICD-10-CM

## 2013-09-21 DIAGNOSIS — E119 Type 2 diabetes mellitus without complications: Secondary | ICD-10-CM

## 2013-09-21 DIAGNOSIS — J45909 Unspecified asthma, uncomplicated: Secondary | ICD-10-CM | POA: Insufficient documentation

## 2013-09-21 DIAGNOSIS — Z0000 Encounter for general adult medical examination without abnormal findings: Secondary | ICD-10-CM

## 2013-09-21 DIAGNOSIS — R3915 Urgency of urination: Secondary | ICD-10-CM

## 2013-09-21 LAB — POCT URINALYSIS DIPSTICK
Bilirubin, UA: NEGATIVE
Blood, UA: NEGATIVE
Glucose, UA: 100
Nitrite, UA: NEGATIVE
Protein, UA: NEGATIVE
Spec Grav, UA: >=1.03
Urobilinogen, UA: 0.2
pH, UA: 5.5

## 2013-09-21 MED ORDER — MUPIROCIN CALCIUM 2 % EX CREA: 1.0000 "application " | TOPICAL_CREAM | Freq: Two times a day (BID) | CUTANEOUS | Status: DC

## 2013-09-21 MED ORDER — SITAGLIPTIN PHOS-METFORMIN HCL 50-1000 MG PO TABS: 1.0000 | ORAL_TABLET | Freq: Two times a day (BID) | ORAL | Status: DC

## 2013-09-21 NOTE — Progress Notes (Signed)
Patient ID: Alicia Lucero MRN: [id number], DOB: 07/29/1961, 53 y.o. Date of Encounter: 09/21/2013, 8:11 AM  Primary Physician: [name], MD  Chief Complaint: Diabetes follow up  HPI: 53 y.o. year old female with history below presents for follow up of diabetes mellitus. Doing well. No issues or complaints. Taking medications daily without adverse effects. No polydipsia, polyphagia, polyuria, or nocturia.  Blood sugars at home: elevated to over 400 over the holidays Diet consists of:  Has been cheating over the holidays Exercising regularly. Last A1C:  6.9  Eye MD: two months ago  Influenza vaccine: Pneumococcal vaccine:  Recently stubbed left small toe with laceration  On Obamacare now  Past Medical History  Diagnosis Date  . Depression   . GERD (gastroesophageal reflux disease)   . Anxiety   . 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 Bipolar 1 disorder   . PCOS (polycystic ovarian syndrome)      Home Meds: Prior to Admission medications   Medication Sig Start Date End Date Taking? Authorizing Provider  aspirin EC 81 MG EC tablet Take 1 tablet (81 mg total) by mouth daily. 12/11/12  Yes Penny Pia, MD  B Complex-C (B-COMPLEX WITH VITAMIN C) tablet Take 1 tablet by mouth daily.   Yes Historical Provider, MD  ciprofloxacin (CIPRO) 250 MG tablet Take 1 tablet (250 mg total) by mouth 2 (two) times daily. 09/18/13  Yes [name], MD  clonazePAM (KLONOPIN) 2 MG tablet Take 2 mg by mouth 3 (three) times daily as needed for anxiety.    Yes Historical Provider, MD  gabapentin (NEURONTIN) 300 MG capsule Take 600 mg by mouth 3 (three) times daily as needed (for anxiety).   Yes Historical Provider, MD  glucose blood (ACCU-CHEK AVIVA PLUS) test strip 1 each by Other route as needed for other. Use as instructed 08/18/13  Yes [name], MD  Insulin Detemir (LEVEMIR FLEXTOUCH) 100 UNIT/ML SOPN Inject 36 Units into the skin daily. 05/11/13  Yes  Shade Flood, MD  lamoTRIgine (LAMICTAL) 200 MG tablet Take 400 mg by mouth every evening.    Yes Historical Provider, MD  metFORMIN (GLUCOPHAGE) 1000 MG tablet Take 1 tablet (1,000 mg total) by mouth 2 (two) times daily with a meal. 06/09/13  Yes [name], MD  Multiple Vitamins-Minerals (WOMENS MULTIVITAMIN PLUS PO) Take 1 tablet by mouth daily.    Yes Historical Provider, MD  naproxen sodium (ANAPROX) 220 MG tablet Take 220 mg by mouth 2 (two) times daily as needed (for pain).   Yes Historical Provider, MD  Omega-3 Fatty Acids (FISH OIL PO) Take 2 capsules by mouth 2 (two) times daily.   Yes Historical Provider, MD  omeprazole (PRILOSEC) 40 MG capsule Take 1 capsule (40 mg total) by mouth daily. 05/05/13  Yes [name], MD  sertraline (ZOLOFT) 100 MG tablet Take 200 mg by mouth daily.     Yes Historical Provider, MD  traZODone (DESYREL) 100 MG tablet Take 1 tablet (100 mg total) by mouth at bedtime. 02/17/13  Yes [name], MD  HYDROcodone-acetaminophen (NORCO) 5-325 MG per tablet Take 1 tablet by mouth every 6 (six) hours as needed for pain. 07/04/13   [name], MD  PROAIR HFA 108 (90 BASE) MCG/ACT inhaler inhale 2 puffs INTO THE LUNGS every 6 hours if needed for wheezing 04/19/13   [name], MD    Allergies:  Allergies  Allergen Reactions  . Other Nausea Only    States she  can't tolerate seafood smell    History   Social History  . Marital Status: Married    Spouse Name: [name]    Number of Children: 0  . Years of Education: Assoc.   Occupational History  .  [employer]   Social History Main Topics  . Smoking status: Former Smoker    Types: Cigarettes    Quit date: 06/30/2005  . Smokeless tobacco: Not on file     Comment: electronic cigarettes PRN  . Alcohol Use: No     Comment: quit: 2007  . Drug Use: No     Comment: occasional  . Sexual Activity: Not on file   Other Topics Concern  . Not on file   Social History Narrative    Patient lives at home with her spouse.   Caffeine Use: 5-7 caffeine drinks daily     Review of Systems: Constitutional: negative for chills, fever, night sweats, weight changes, or fatigue  HEENT: negative for vision changes, hearing loss, congestion, rhinorrhea, or epistaxis Cardiovascular: negative for chest pain, palpitations, diaphoresis, DOE, orthopnea, or edema Respiratory: negative for hemoptysis, wheezing, shortness of breath, dyspnea, or cough Abdominal: negative for abdominal pain, nausea, vomiting, diarrhea, or constipation Dermatological: negative for rash, erythema, or wounds Neurologic: negative for headache, dizziness, or syncope Renal:  Negative for polyuria, polydipsia, or dysuria All other systems reviewed and are otherwise negative with the exception to those above and in the HPI.   Physical Exam: Blood pressure 120/74, pulse 97, temperature 98.8 F (37.1 C), temperature source Oral, resp. rate 16, height 5\' 4"  (1.626 m), weight 329 lb 3.2 oz (149.324 kg), last menstrual period 08/30/2013, SpO2 95.00%., Body mass index is 56.48 kg/(m^2). General: Well developed, well nourished, in no acute distress. Head: Normocephalic, atraumatic, eyes without discharge, sclera non-icteric, nares are without discharge. Bilateral auditory canals clear, TM's are without perforation, pearly grey and translucent with reflective cone of light bilaterally. Oral cavity moist, posterior pharynx without exudate, erythema, peritonsillar abscess, or post nasal drip.  Neck: Supple. No thyromegaly. Full ROM. No lymphadenopathy. Lungs: Clear bilaterally to auscultation without wheezes, rales, or rhonchi. Breathing is unlabored. Heart: RRR with S1 S2. No murmurs, rubs, or gallops appreciated. Abdomen: Soft, non-tender, non-distended with normoactive bowel sounds. No hepatosplenomegaly. No rebound/guarding. No obvious abdominal masses. Msk:  Strength and tone normal for age. Extremities/Skin: Warm and  dry. No clubbing or cyanosis. No edema. No rashes, wounds, or suspicious lesions. Monofilament exam negative.  Neuro: Alert and oriented X 3. Moves all extremities spontaneously. Gait is normal. CNII-XII grossly in tact. Psych:  Responds to questions appropriately with a normal affect.   Labs:  Results for orders placed in visit on 09/21/13  POCT URINALYSIS DIPSTICK      Result Value Range   Color, UA yellow     Clarity, UA cloudy     Glucose, UA 100     Bilirubin, UA neg     Ketones, UA trace     Spec Grav, UA >=1.030     Blood, UA neg     pH, UA 5.5     Protein, UA neg     Urobilinogen, UA 0.2     Nitrite, UA neg     Leukocytes, UA small (1+)        ASSESSMENT AND PLAN:  53 y.o. year old female with UTI, toe abrasion, diabetes Asthma, chronic  Urinary urgency - Plan: POCT urinalysis dipstick, Urine culture, CANCELED: POCT UA - Microscopic Only  Type II  or unspecified type diabetes mellitus without mention of complication, not stated as uncontrolled - Plan: HM Diabetes Foot Exam  Toe pain, left - Plan: mupirocin cream (BACTROBAN) 2 %  Health care maintenance - Plan: Ambulatory referral to Gastroenterology   -  Signed, [name], MD 09/21/2013 8:11 AM

## 2013-09-22 ENCOUNTER — Other Ambulatory Visit: Payer: Self-pay | Admitting: Family Medicine

## 2013-09-22 MED ORDER — MUPIROCIN 2 % EX OINT: 1.0000 "application " | TOPICAL_OINTMENT | Freq: Two times a day (BID) | CUTANEOUS | Status: DC

## 2013-09-22 NOTE — Addendum Note (Signed)
Addended by: Thelma BargeICHARDSON, Bora Bost D on: 09/22/2013 05:55 PM   Modules accepted: Orders, Medications

## 2013-09-23 LAB — URINE CULTURE: Colony Count: >=100000

## 2013-09-26 MED ORDER — FLUCONAZOLE 150 MG PO TABS: 150.0000 mg | ORAL_TABLET | Freq: Once | ORAL | Status: DC

## 2013-09-26 MED ORDER — CEPHALEXIN 500 MG PO CAPS: 500.0000 mg | ORAL_CAPSULE | Freq: Three times a day (TID) | ORAL | Status: DC

## 2013-09-26 NOTE — Addendum Note (Signed)
Addended by: Johnnette LitterARDWELL, Jenaya Saar M on: 09/26/2013 01:44 PM   Modules accepted: Orders

## 2013-09-28 ENCOUNTER — Encounter: Payer: Self-pay | Admitting: Nurse Practitioner

## 2013-10-27 ENCOUNTER — Other Ambulatory Visit: Payer: Self-pay | Admitting: Family Medicine

## 2013-10-31 ENCOUNTER — Telehealth: Payer: Self-pay

## 2013-10-31 NOTE — Telephone Encounter (Signed)
Pt needs to talk with someone about her medications and her insurance company approving the maintenance meds  Such as [employer]  Best number [phone]

## 2013-11-01 NOTE — Telephone Encounter (Signed)
Pt clarified that she needs a prior auth on Janumet. Metformin has not controlled her blood sugar, but Janumet has been working to keep FBS under control. Completed PA on covermymeds.

## 2013-11-03 MED ORDER — SAXAGLIPTIN-METFORMIN ER 5-1000 MG PO TB24: 1.0000 | ORAL_TABLET | Freq: Every day | ORAL | Status: DC

## 2013-11-03 NOTE — Telephone Encounter (Signed)
Lets try Kombiglyze which is a similar med to Janumet - it is a combo with metformin.

## 2013-11-03 NOTE — Telephone Encounter (Signed)
PA denied for Janumet because pt has not tried/failed maximum dose of metformin (at least 1500 mg daily) AND Tradjenta OR Onglyza. Pt does have h/o failure w/ metformin 1000 BID which should qualify for metformin trial. Dr L, do you want to Rx either Onglyza or Tradjenta?

## 2013-11-04 NOTE — Telephone Encounter (Signed)
Please advise dosage of Kombiglyze I will order.

## 2013-11-04 NOTE — Telephone Encounter (Signed)
I already sent it to the pharmacy.

## 2013-11-05 NOTE — Telephone Encounter (Signed)
Mailbox is full. Unable to leave a message.  

## 2013-11-05 NOTE — Telephone Encounter (Signed)
Spoke with pt, advised Rx changes. She wants Dr. Milus GlazierLauenstein to ok these changes. She wants to know if Kombiglyze is safe and effective.

## 2013-11-05 NOTE — Telephone Encounter (Signed)
This has become the most common diabetes medicine of its kind in the [place].

## 2013-11-06 ENCOUNTER — Telehealth: Payer: Self-pay

## 2013-11-07 NOTE — Telephone Encounter (Signed)
Called pt, mailbox full

## 2013-11-07 NOTE — Telephone Encounter (Signed)
error 

## 2013-11-09 ENCOUNTER — Other Ambulatory Visit: Payer: Self-pay | Admitting: Family Medicine

## 2013-11-14 ENCOUNTER — Ambulatory Visit: Payer: 59 | Admitting: Nurse Practitioner

## 2013-11-16 ENCOUNTER — Ambulatory Visit: Payer: Self-pay | Admitting: Nurse Practitioner

## 2013-11-17 ENCOUNTER — Encounter: Payer: Self-pay | Admitting: Family Medicine

## 2013-11-17 ENCOUNTER — Ambulatory Visit (INDEPENDENT_AMBULATORY_CARE_PROVIDER_SITE_OTHER): Payer: No Typology Code available for payment source | Admitting: Family Medicine

## 2013-11-17 VITALS — BP 120/80 | HR 77 | Temp 98.1°F | Resp 16 | Ht 64.0 in | Wt 320.0 lb

## 2013-11-17 DIAGNOSIS — F319 Bipolar disorder, unspecified: Secondary | ICD-10-CM

## 2013-11-17 DIAGNOSIS — L03319 Cellulitis of trunk, unspecified: Secondary | ICD-10-CM

## 2013-11-17 DIAGNOSIS — L02214 Cutaneous abscess of groin: Secondary | ICD-10-CM

## 2013-11-17 DIAGNOSIS — L02219 Cutaneous abscess of trunk, unspecified: Secondary | ICD-10-CM

## 2013-11-17 MED ORDER — LAMOTRIGINE 200 MG PO TABS: 400.0000 mg | ORAL_TABLET | Freq: Every evening | ORAL | Status: DC

## 2013-11-17 MED ORDER — DOXYCYCLINE HYCLATE 100 MG PO TABS: 100.0000 mg | ORAL_TABLET | Freq: Two times a day (BID) | ORAL | Status: DC

## 2013-11-17 MED ORDER — CLONAZEPAM 2 MG PO TABS: 2.0000 mg | ORAL_TABLET | Freq: Three times a day (TID) | ORAL | Status: DC | PRN

## 2013-11-17 MED ORDER — SERTRALINE HCL 100 MG PO TABS: 200.0000 mg | ORAL_TABLET | Freq: Every day | ORAL | Status: DC

## 2013-11-17 MED ORDER — FLUCONAZOLE 150 MG PO TABS: 150.0000 mg | ORAL_TABLET | Freq: Once | ORAL | Status: DC

## 2013-11-17 NOTE — Telephone Encounter (Signed)
ERROR

## 2013-11-17 NOTE — Patient Instructions (Signed)
Please apply hot compresses four times a day. Let's recheck this in 1 week.

## 2013-11-17 NOTE — Progress Notes (Signed)
Subjective:    Patient ID: Alicia Lucero, female    DOB: [birth year], 53 y.o.   MRN: [id number]  HPI   Patient ID: Alicia Lucero MRN: [id number], DOB: [birth year], 53 y.o. Date of Encounter: 11/17/2013, 8:42 AM  Primary Physician: Elvina SidleLAUENSTEIN,KURT, MD  Chief Complaint: Physical (CPE)  HPI Comments: Alicia Lucero is a 53 y.o. female [facility] of a persistent sebaceous cyst on right side of groin onset 2 weeks. Reports associated constant moderate pain to site. Reports associated copious amounts of purulent drainage expelled. Denies any aggravating or alleviating factors. Denies associated fever. Reports PMHx of DM. Reports blood sugar is controlled, but still unable to lose weight.   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   Review of Systems: Consitutional: No fever, chills, fatigue, night sweats, or weight changes. Eyes: No visual changes, eye redness, or discharge. ENT/Mouth: Ears: No otalgia, tinnitus, hearing loss, discharge. Nose: No congestion, rhinorrhea, sinus pain, or epistaxis. Throat: No sore throat, post nasal drip, or teeth pain. Cardiovascular: No CP, palpitations, diaphoresis, DOE, edema, orthopnea, PND. Respiratory: No cough, hemoptysis, SOB, or wheezing. Gastrointestinal: No anorexia, dysphagia, reflux, pain, nausea, vomiting, hematemesis, diarrhea, constipation, BRBPR, or melena. Breast: No discharge, pain, swelling, or mass. Genitourinary: No dysuria, frequency, urgency, hematuria, incontinence, nocturia, amenorrhea, vaginal discharge, pruritis, burning, abnormal bleeding, or pain. Musculoskeletal: No decreased ROM, myalgias, stiffness, joint swelling, or weakness. Skin: Erythematous sebaceous cyst on right side of groin. Neurological: No headache, dizziness, syncope, seizures, tremors, memory loss, coordination problems, or paresthesias. Psychological: No anxiety, depression, hallucinations, SI/HI. Endocrine:  No fatigue, polydipsia, polyphagia, polyuria, or known diabetes. All other systems were reviewed and are otherwise negative.  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 Authorizing Provider  ACCU-CHEK SOFTCLIX LANCETS lancets USE AS DIRECTED    Nelva NayHeather M Marte, PA-C  aspirin EC 81 MG EC tablet Take 1 tablet (81 mg total) by mouth daily. 12/11/12   Penny Piarlando Vega, MD  B Complex-C (B-COMPLEX WITH VITAMIN C) tablet Take 1 tablet by mouth daily.    Historical Provider, MD  cephALEXin (KEFLEX) 500 MG capsule Take 1 capsule (500 mg total) by mouth 3 (three) times daily. 09/26/13   Elvina SidleKurt Lauenstein, MD  ciprofloxacin (CIPRO) 250 MG tablet Take 1 tablet (250 mg total) by mouth 2 (two) times daily. 09/18/13   Elvina SidleKurt Lauenstein, MD  clonazePAM (KLONOPIN) 2 MG tablet Take 2 mg by mouth 3 (three) times daily as needed for anxiety.     Historical Provider, MD  fluconazole (DIFLUCAN) 150 MG tablet Take 1 tablet (150 mg total) by mouth once. 09/26/13   Elvina SidleKurt Lauenstein, MD  gabapentin (NEURONTIN) 300 MG capsule Take 600 mg by mouth 3 (three) times daily as needed (for anxiety).    Historical Provider, MD  glucose blood (ACCU-CHEK AVIVA PLUS)  test strip 1 each by Other route as needed for other. Use as instructed 08/18/13   [name], MD  HYDROcodone-acetaminophen [facility]) 5-325 MG per tablet Take 1 tablet by mouth every 6 (six) hours as needed for pain. 07/04/13   [name], MD    Insulin Detemir (LEVEMIR FLEXTOUCH) 100 UNIT/ML SOPN Inject 36 Units into the skin daily. 05/11/13   Shade Flood, MD  lamoTRIgine (LAMICTAL) 200 MG tablet Take 400 mg by mouth every evening.     Historical Provider, MD  Multiple Vitamins-Minerals (WOMENS MULTIVITAMIN PLUS PO) Take 1 tablet by mouth daily.     Historical Provider, MD  mupirocin ointment (BACTROBAN) 2 % APPLY TO AFFECTED AREAS TWICE DAILY 09/22/13   [name], MD  mupirocin ointment (BACTROBAN) 2 % Apply 1 application topically 2 (two) times daily. 09/22/13   [name], MD  naproxen sodium (ANAPROX) 220 MG tablet Take 220 mg by mouth 2 (two) times daily as needed (for pain).    Historical Provider, MD  Omega-3 Fatty Acids (FISH OIL PO) Take 2 capsules by mouth 2 (two) times daily.    Historical Provider, MD  omeprazole (PRILOSEC) 40 MG capsule Take 1 capsule (40 mg total) by mouth daily. 05/05/13   [name], MD  PROAIR HFA 108 (90 BASE) MCG/ACT inhaler inhale 2 puffs INTO THE LUNGS every 6 hours if needed for wheezing 04/19/13   [name], MD  Saxagliptin-Metformin 01-999 MG TB24 Take 1 tablet by mouth daily. 11/03/13   Morrell Riddle, PA-C  sertraline (ZOLOFT) 100 MG tablet Take 200 mg by mouth daily.      Historical Provider, MD  sitaGLIPtin-metformin (JANUMET) 50-1000 MG per tablet Take 1 tablet by mouth 2 (two) times daily with a meal. 09/21/13   [name], MD  traZODone (DESYREL) 100 MG tablet Take 1 tablet (100 mg total) by mouth at bedtime. 02/17/13   [name], MD    Allergies:  Allergies  Allergen Reactions   Other Nausea Only    States she can't tolerate seafood smell    History   Social History   Marital Status: Married    Spouse Name: [name]    Number of Children: 0   Years of Education: Assoc.   Occupational History    [employer]   Social History Main Topics   Smoking status: Former Smoker    Types: Cigarettes    Quit date: 06/30/2005   Smokeless tobacco:  Not on file     Comment: electronic cigarettes PRN   Alcohol Use: No     Comment: quit: 2007   Drug Use: No     Comment: occasional   Sexual Activity: Not on file   Other Topics Concern   Not on file   Social History Narrative   Patient lives at home with her spouse.   Caffeine Use: 5-7 caffeine drinks daily    Family History  Problem Relation Age of Onset   Cancer Mother     lung   Hypertension Mother    Cancer Father     liver   Hypertension Father    Heart disease Father     CVD   Multiple sclerosis Sister    Hypertension Sister     Ceasar Mons Vitals:   11/17/13 0841  BP: 120/80  Pulse: 77  Temp: 98.1 F (36.7 C)  TempSrc: Oral  Resp: 16  Height: 5\' 4"  (1.626 m)  Weight: 320 lb (145.151 kg)  SpO2: 95%    Assessment/Plan:  53 y.o. y/o female here for a possible sebaceous cyst.  Bipolar disorder, unspecified - Plan: sertraline (ZOLOFT) 100 MG tablet, fluconazole (DIFLUCAN) 150 MG tablet, clonazePAM (KLONOPIN) 2 MG tablet, lamoTRIgine (LAMICTAL) 200 MG tablet  Abscess of groin, right - Plan: doxycycline (VIBRA-TABS) 100 MG tablet  Follow up in one week, use hot compresses qid.     Signed, [name], MD 11/17/2013 8:42 AM      Review of Systems     Objective:   Physical Exam  Nursing note and vitals reviewed. Constitutional: She is oriented to person, place, and time. She appears well-developed and well-nourished. No distress.  HENT:  Head: Normocephalic and atraumatic.  Eyes: EOM are normal.  Neck: Neck supple. No tracheal deviation present.  Cardiovascular: Normal rate.   Pulmonary/Chest: Effort normal. No respiratory distress.  Musculoskeletal: Normal range of motion.  Neurological: She is alert and oriented to person, place, and time.  Skin: Skin is warm.  Warm and moist without ecchymosis, wounds, or rash. There is a 2 cm indurated area in right inguinal region. Area is non-fluctuant, and firm. Well marginated. With  overlying 1 cm erythema and no drainage.    Psychiatric: She has a normal mood and affect. Her behavior is normal.          Assessment & Plan:  Patient has developed a right groin abscess. She's more manic than she's been in past visits but she is at maximum medicine. She has not seen her therapist recently and hopes to reschedule.  It appears that her weight is stable and her blood sugars, by history, are stable.  Bipolar disorder, unspecified - Plan: sertraline (ZOLOFT) 100 MG tablet, fluconazole (DIFLUCAN) 150 MG tablet, clonazePAM (KLONOPIN) 2 MG tablet, lamoTRIgine (LAMICTAL) 200 MG tablet  Abscess of groin, right - Plan: doxycycline (VIBRA-TABS) 100 MG tablet  Signed, [name], MD

## 2013-11-24 ENCOUNTER — Encounter: Payer: Self-pay | Admitting: Family Medicine

## 2013-11-24 ENCOUNTER — Ambulatory Visit (INDEPENDENT_AMBULATORY_CARE_PROVIDER_SITE_OTHER): Payer: No Typology Code available for payment source | Admitting: Family Medicine

## 2013-11-24 VITALS — BP 122/73 | HR 78 | Temp 98.5°F | Resp 16 | Ht 64.0 in | Wt 326.0 lb

## 2013-11-24 DIAGNOSIS — E119 Type 2 diabetes mellitus without complications: Secondary | ICD-10-CM

## 2013-11-24 DIAGNOSIS — L02219 Cutaneous abscess of trunk, unspecified: Secondary | ICD-10-CM

## 2013-11-24 DIAGNOSIS — L02214 Cutaneous abscess of groin: Secondary | ICD-10-CM

## 2013-11-24 DIAGNOSIS — L03319 Cellulitis of trunk, unspecified: Secondary | ICD-10-CM

## 2013-11-24 DIAGNOSIS — F319 Bipolar disorder, unspecified: Secondary | ICD-10-CM

## 2013-11-24 LAB — COMPREHENSIVE METABOLIC PANEL
ALT: 15 U/L (ref 0–35)
AST: 15 U/L (ref 0–37)
Albumin: 3.8 g/dL (ref 3.5–5.2)
Alkaline Phosphatase: 80 U/L (ref 39–117)
BUN: 13 mg/dL (ref 6–23)
CO2: 24 mEq/L (ref 19–32)
Calcium: 8.7 mg/dL (ref 8.4–10.5)
Chloride: 105 mEq/L (ref 96–112)
Creat: 0.68 mg/dL (ref 0.50–1.10)
Glucose, Bld: 199 mg/dL — ABNORMAL HIGH (ref 70–99)
Potassium: 4.4 mEq/L (ref 3.5–5.3)
Sodium: 138 mEq/L (ref 135–145)
Total Bilirubin: 0.3 mg/dL (ref 0.2–1.2)
Total Protein: 6.4 g/dL (ref 6.0–8.3)

## 2013-11-24 LAB — POCT GLYCOSYLATED HEMOGLOBIN (HGB A1C): Hemoglobin A1C: 7.7

## 2013-11-24 MED ORDER — DOXYCYCLINE HYCLATE 100 MG PO TABS: 100.0000 mg | ORAL_TABLET | Freq: Two times a day (BID) | ORAL | Status: DC

## 2013-11-24 MED ORDER — FLUCONAZOLE 150 MG PO TABS: 150.0000 mg | ORAL_TABLET | Freq: Once | ORAL | Status: DC

## 2013-11-24 NOTE — Progress Notes (Addendum)
Subjective:  This chart was scribed for Alicia Sidle, MD by Alicia Lucero, Medical Scribe. This patient was seen in Room 25 and the patient's care was started at 9:45 AM.   Patient ID: Alicia Lucero, female    DOB: 10/26/1960, 53 y.o.   MRN: [id number]  HPI HPI Comments: Alicia Lucero is a 53 y.o. female who presents to the [facility] for a follow-up appointment for a groin abscess.  The patient states that the abscess is not as painful or as large as it was before.  She states that the abscess is no longer draining.  She states that it is itching which she suspects indicates that it is healing.  The patient is also complaining of a constant cough.    The patient states that the cough is worse in the morning and makes her feel as though she has sputum in her throat.  She denies sore throat as an associated symptom and states that she has noticed some mild thrush in her mouth.  She states that she has taken Diflucan and Doxycycline for her symptoms.  She states that she has been around someone that had the flu recently.  The patient states that she cannot be around her sister with her cough because she has MS and is immunocompromised.    The patient states that she has not had an A1C since August and does not check her blood sugar regularly.  She states that her job is going well.     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   Bipolar 1 disorder    PCOS (polycystic ovarian syndrome)    Past Surgical History  Procedure Laterality Date   Cholecystectomy  1996   Tonsillectomy      as a child   Knee arthroscopy Left 2008   Dilation and curettage of uterus      secondary to menorrhagia   Wisdom tooth extraction     Family History  Problem Relation Age of Onset   Cancer Mother     lung   Hypertension Mother    Cancer  Father     liver   Hypertension Father    Heart disease Father     CVD   Multiple sclerosis Sister    Hypertension Sister    History   Social History   Marital Status: Married    Spouse Name: Alicia Lucero    Number of Children: 0   Years of Education: Assoc.   Occupational History    [employer]   Social History Main Topics   Smoking status: Former Smoker    Types: Cigarettes    Quit date: 06/30/2005   Smokeless tobacco: Not on file     Comment: electronic cigarettes PRN   Alcohol Use: No     Comment: quit: 2007   Drug Use: No     Comment: occasional   Sexual Activity: Not on file   Other Topics Concern   Not on file   Social History Narrative   Patient lives at home with her spouse.   Caffeine Use: 5-7 caffeine drinks daily   Allergies  Allergen Reactions   Other Nausea Only    States she can't tolerate seafood smell    Review of Systems  HENT: Negative for sore throat.   Respiratory: Positive for  cough.   All other systems reviewed and are negative.    Objective:  Physical Exam  Nursing note and vitals reviewed. Constitutional: She is oriented to person, place, and time. She appears well-developed and well-nourished. No distress.  HENT:  Head: Normocephalic and atraumatic.  Eyes: EOM are normal.  Neck: Neck supple. No tracheal deviation present.  Cardiovascular: Normal rate.   Pulmonary/Chest: Effort normal and breath sounds normal. No respiratory distress. She has no wheezes. She has no rales.  Musculoskeletal: Normal range of motion.  Neurological: She is alert and oriented to person, place, and time.  Skin: Skin is warm and dry.  Psychiatric: She has a normal mood and affect. Her behavior is normal.   Mild swelling right groin (markedly decreased), much less tender.  BP 122/73   Pulse 78   Temp(Src) 98.5 F (36.9 C)   Resp 16   Ht 5\' 4"  (1.626 m)   Wt 326 lb (147.873 kg)   BMI 55.93 kg/m2   SpO2 95%   LMP 10/27/2013 Assessment &  Plan:    I personally performed the services described in this documentation, which was scribed in my presence. The recorded information has been reviewed and is accurate.  Type II or unspecified type diabetes mellitus without mention of complication, not stated as uncontrolled - Plan: HM Diabetes Foot Exam, Comprehensive metabolic panel, POCT glycosylated hemoglobin (Hb A1C)  Abscess of groin, right - Plan: doxycycline (VIBRA-TABS) 100 MG tablet  Bipolar disorder, unspecified  Morbid obesity I spent 15 minutes reviewing diet, the importance of not eating late at night and exercising daily. The patient's because weakness is eating at night when she's not movies with her husband.  Signed, Alicia SidleKurt Lauenstein, MD

## 2013-12-05 ENCOUNTER — Encounter: Payer: Self-pay | Admitting: Family Medicine

## 2013-12-07 ENCOUNTER — Telehealth: Payer: Self-pay

## 2013-12-07 NOTE — Telephone Encounter (Signed)
PA needed for Kombiglyze. Completed over the phone and approved through 12/07/16. Notified pharm.

## 2013-12-15 ENCOUNTER — Ambulatory Visit (INDEPENDENT_AMBULATORY_CARE_PROVIDER_SITE_OTHER): Payer: No Typology Code available for payment source | Admitting: Family Medicine

## 2013-12-15 ENCOUNTER — Encounter: Payer: Self-pay | Admitting: Family Medicine

## 2013-12-15 VITALS — BP 120/76 | HR 85 | Temp 98.0°F | Resp 16 | Ht 63.5 in | Wt 326.0 lb

## 2013-12-15 DIAGNOSIS — M778 Other enthesopathies, not elsewhere classified: Secondary | ICD-10-CM

## 2013-12-15 DIAGNOSIS — M779 Enthesopathy, unspecified: Secondary | ICD-10-CM

## 2013-12-15 DIAGNOSIS — E119 Type 2 diabetes mellitus without complications: Secondary | ICD-10-CM

## 2013-12-15 DIAGNOSIS — M65849 Other synovitis and tenosynovitis, unspecified hand: Secondary | ICD-10-CM

## 2013-12-15 DIAGNOSIS — M65839 Other synovitis and tenosynovitis, unspecified forearm: Secondary | ICD-10-CM

## 2013-12-15 DIAGNOSIS — R300 Dysuria: Secondary | ICD-10-CM

## 2013-12-15 LAB — POCT UA - MICROSCOPIC ONLY
Casts, Ur, LPF, POC: NEGATIVE
Crystals, Ur, HPF, POC: NEGATIVE
Mucus, UA: POSITIVE
Yeast, UA: NEGATIVE

## 2013-12-15 LAB — POCT URINALYSIS DIPSTICK
Bilirubin, UA: NEGATIVE
Blood, UA: NEGATIVE
Glucose, UA: NEGATIVE
Ketones, UA: NEGATIVE
Leukocytes, UA: NEGATIVE
Nitrite, UA: NEGATIVE
Protein, UA: NEGATIVE
Spec Grav, UA: >=1.03
Urobilinogen, UA: 0.2
pH, UA: 5.5

## 2013-12-15 LAB — MICROALBUMIN, URINE: Microalb, Ur: 0.71 mg/dL (ref 0.00–1.89)

## 2013-12-15 LAB — POCT GLYCOSYLATED HEMOGLOBIN (HGB A1C): Hemoglobin A1C: 7.7

## 2013-12-15 MED ORDER — EMPAGLIFLOZIN-LINAGLIPTIN 10-5 MG PO TABS: 10.0000 mg | ORAL_TABLET | Freq: Every day | ORAL | Status: DC

## 2013-12-15 NOTE — Addendum Note (Signed)
Addended by: Anselm PancoastOBINSON, Asser Lucena R on: 12/15/2013 11:04 AM   Modules accepted: Orders

## 2013-12-15 NOTE — Progress Notes (Signed)
Subjective:  This chart was scribed for [name], MD by Carl Best, Medical Scribe. This patient was seen in Room 22 and the patient's care was started at 9:01 AM.   Patient ID: Alicia Lucero, female    DOB: 1961/08/11, 53 y.o.   MRN: [id number]  HPI HPI Comments: Alicia Lucero is a 53 y.o. female who presents to the [facility] complaining of hyperglycemia.  She states that she feels as though the [place] is not working for her.  She states that she is experiencing intermittent pain and numbness in her feet.  The patient states that her sugar was 198 this morning but it was very close to 300 last night.  She states that her diet has still not improved.  She states that she is currently taking 40 units of Insulin.  The patient is also complaining of intermittent arm right arm and right hand pain that started two months ago after she picked up a 12 pack of sodas.  She states that is able to make a fist but initially she had decreased range of motion.  She states that she has taken antiinflammatory medication with no relief to her symptoms.  She states that the right hand is no longer swollen.      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   Bipolar 1 disorder    PCOS (polycystic ovarian syndrome)    Past Surgical History  Procedure Laterality Date   Cholecystectomy  1996   Tonsillectomy      as a child   Knee arthroscopy Left 2008   Dilation and curettage of uterus      secondary to menorrhagia   Wisdom tooth extraction     Family History  Problem Relation Age of Onset   Cancer Mother     lung   Hypertension Mother    Cancer Father     liver   Hypertension Father    Heart disease Father     CVD   Multiple sclerosis Sister    Hypertension Sister    History   Social History   Marital Status:  Married    Spouse Name: [name]    Number of Children: 0   Years of Education: Assoc.   Occupational History    [employer]   Social History Main Topics   Smoking status: Former Smoker    Types: Cigarettes    Quit date: 06/30/2005   Smokeless tobacco: Not on file     Comment: electronic cigarettes PRN   Alcohol Use: No     Comment: quit: 2007   Drug Use: No     Comment: occasional   Sexual Activity: Not on file   Other Topics Concern   Not on file   Social History Narrative   Patient lives at home with her spouse.   Caffeine Use: 5-7 caffeine drinks daily   Allergies  Allergen Reactions   Other Nausea Only    States she can't tolerate seafood smell    Review of Systems  Musculoskeletal: Positive for arthralgias (right arm, right hand).  All other systems reviewed and are negative.    Objective:  Physical Exam  Nursing note and vitals reviewed. Constitutional: She is oriented to person, place, and time. She appears well-developed and well-nourished.  HENT:  Head: Normocephalic and atraumatic.  Eyes: EOM are normal.  Neck: Normal range of motion.  Cardiovascular: Normal rate.   Pulmonary/Chest: Effort normal.  Musculoskeletal: Normal range of motion.  Neurological: She is alert and oriented to person, place, and time.  Skin: Skin is warm and dry.  Psychiatric: She has a normal mood and affect. Her behavior is normal.   Morbidly obese woman in no distress. She's good-natured and cooperative and seen with her husband Bud day. HEENT: Unremarkable Chest: Clear Heart: Regular Extremities: Without edema. She is able to open and close her right hand completely but somewhat slowly. There is no swelling or palms and there is no rash or erythema. Results for orders placed in visit on 12/15/13  POCT GLYCOSYLATED HEMOGLOBIN (HGB A1C)      Result Value Ref Range   Hemoglobin A1C 7.7    POCT URINALYSIS DIPSTICK      Result Value Ref Range   Color, UA  yellow     Clarity, UA clear     Glucose, UA neg     Bilirubin, UA neg     Ketones, UA neg     Spec Grav, UA >=1.030     Blood, UA neg     pH, UA 5.5     Protein, UA neg     Urobilinogen, UA 0.2     Nitrite, UA neg     Leukocytes, UA Negative    POCT UA - MICROSCOPIC ONLY      Result Value Ref Range   WBC, Ur, HPF, POC 0-1     RBC, urine, microscopic 0-1     Bacteria, U Microscopic 2+     Mucus, UA pos     Epithelial cells, urine per micros 3-4     Crystals, Ur, HPF, POC neg     Casts, Ur, LPF, POC neg     Yeast, UA neg       BP 120/76   Pulse 85   Temp(Src) 98 F (36.7 C) (Oral)   Resp 16   Ht 5' 3.5" (1.613 m)   Wt 326 lb (147.873 kg)   BMI 56.84 kg/m2   SpO2 98%   LMP 12/08/2013 Assessment & Plan:    I personally performed the services described in this documentation, which was scribed in my presence. The recorded information has been reviewed and is accurate.  Type II or unspecified type diabetes mellitus without mention of complication, not stated as uncontrolled - Plan: POCT glycosylated hemoglobin (Hb A1C), Empagliflozin-Linagliptin (GLYXAMBI) 10-5 MG TABS, POCT urinalysis dipstick, Microalbumin, urine  Hand tendonitis  Dysuria - Plan: POCT UA - Microscopic Only  Signed, Elvina SidleKurt Lauenstein, MD

## 2013-12-15 NOTE — Patient Instructions (Signed)
Stop the kombiglyze, start the glyxambi.  Reduce levimir to 25 unitis daily

## 2013-12-16 LAB — URINE CULTURE: Colony Count: 40000

## 2013-12-19 ENCOUNTER — Telehealth: Payer: Self-pay

## 2013-12-19 NOTE — Telephone Encounter (Signed)
PA needed for Glyxambi. Completed on covermymeds.

## 2014-01-23 ENCOUNTER — Ambulatory Visit (INDEPENDENT_AMBULATORY_CARE_PROVIDER_SITE_OTHER): Payer: No Typology Code available for payment source | Admitting: Family Medicine

## 2014-01-23 ENCOUNTER — Encounter: Payer: Self-pay | Admitting: Family Medicine

## 2014-01-23 VITALS — BP 100/76 | HR 72 | Resp 18 | Wt 322.0 lb

## 2014-01-23 DIAGNOSIS — IMO0001 Reserved for inherently not codable concepts without codable children: Secondary | ICD-10-CM

## 2014-01-23 DIAGNOSIS — E119 Type 2 diabetes mellitus without complications: Secondary | ICD-10-CM

## 2014-01-23 DIAGNOSIS — E1165 Type 2 diabetes mellitus with hyperglycemia: Secondary | ICD-10-CM

## 2014-01-23 LAB — GLUCOSE, POCT (MANUAL RESULT ENTRY): POC Glucose: 217 mg/dl — AB (ref 70–99)

## 2014-01-23 MED ORDER — EMPAGLIFLOZIN 10 MG PO TABS: 10.0000 mg | ORAL_TABLET | Freq: Every day | ORAL | Status: DC

## 2014-01-23 NOTE — Progress Notes (Signed)
Patient ID: Alicia Lucero MRN: [id number], DOB: 27-May-1961, 53 y.o. Date of Encounter: 01/23/2014, 8:15 AM  Primary Physician: [name], MD  Chief Complaint: Diabetes follow up  HPI: 53 y.o. year old female with history below presents for follow up of diabetes mellitus. Doing well. No issues or complaints. Taking medications daily without adverse effects. No polydipsia, polyphagia, polyuria, or nocturia.  Blood sugars at home: sometimes over 300  Caring for sister with MS   Decreased feeling in feet Night sweats Still having menses  Past Medical History  Diagnosis Date  . Depression   . GERD (gastroesophageal reflux disease)   . Anxiety   . 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 Bipolar 1 disorder   . PCOS (polycystic ovarian syndrome)      Home Meds: Prior to Admission medications   Medication Sig Start Date End Date Taking? Authorizing Provider  ACCU-CHEK SOFTCLIX LANCETS lancets USE AS DIRECTED    Nelva Nay, PA-C  aspirin EC 81 MG EC tablet Take 1 tablet (81 mg total) by mouth daily. 12/11/12   Penny Pia, MD  B Complex-C (B-COMPLEX WITH VITAMIN C) tablet Take 1 tablet by mouth daily.    Historical Provider, MD  clonazePAM (KLONOPIN) 2 MG tablet Take 1 tablet (2 mg total) by mouth 3 (three) times daily as needed for anxiety. 11/17/13   [name], MD  doxycycline (VIBRA-TABS) 100 MG tablet Take 1 tablet (100 mg total) by mouth 2 (two) times daily. 11/24/13   [name], MD  Empagliflozin-Linagliptin (GLYXAMBI) 10-5 MG TABS Take 10 mg by mouth daily. 12/15/13   [name], MD  fluconazole (DIFLUCAN) 150 MG tablet Take 1 tablet (150 mg total) by mouth once. 11/17/13   [name], MD  fluconazole (DIFLUCAN) 150 MG tablet Take 1 tablet (150 mg total) by mouth once. 11/24/13   [name], MD  gabapentin (NEURONTIN) 300 MG capsule Take 600 mg by mouth 3 (three) times daily as needed (for anxiety).    Historical  Provider, MD  glucose blood (ACCU-CHEK AVIVA PLUS) test strip 1 each by Other route as needed for other. Use as instructed 08/18/13   [name], MD  Insulin Detemir (LEVEMIR FLEXTOUCH) 100 UNIT/ML SOPN Inject 36 Units into the skin daily. 05/11/13   Shade Flood, MD  lamoTRIgine (LAMICTAL) 200 MG tablet Take 2 tablets (400 mg total) by mouth every evening. 11/17/13   [name], MD  Multiple Vitamins-Minerals (WOMENS MULTIVITAMIN PLUS PO) Take 1 tablet by mouth daily.     Historical Provider, MD  mupirocin ointment (BACTROBAN) 2 % APPLY TO AFFECTED AREAS TWICE DAILY 09/22/13   [name], MD  naproxen sodium (ANAPROX) 220 MG tablet Take 220 mg by mouth 2 (two) times daily as needed (for pain).    Historical Provider, MD  Omega-3 Fatty Acids (FISH OIL PO) Take 2 capsules by mouth 2 (two) times daily.    Historical Provider, MD  omeprazole (PRILOSEC) 40 MG capsule Take 1 capsule (40 mg total) by mouth daily. 05/05/13   [name], MD  PRESCRIPTION MEDICATION kombiglyze taking for diabetes    Historical Provider, MD  [facility] HFA 108 (90 BASE) MCG/ACT inhaler inhale 2 puffs INTO THE LUNGS every 6 hours if needed for wheezing 04/19/13   [name], MD  sertraline (ZOLOFT) 100 MG tablet Take 2 tablets (200 mg total) by mouth daily. 11/17/13   [name], MD  traZODone (DESYREL) 100 MG tablet Take 1 tablet (  100 mg total) by mouth at bedtime. 02/17/13   [name], MD    Allergies:  Allergies  Allergen Reactions  . Other Nausea Only    States she can't tolerate seafood smell    History   Social History  . Marital Status: Married    Spouse Name: [name]    Number of Children: 0  . Years of Education: Assoc.   Occupational History  .  [employer]   Social History Main Topics  . Smoking status: Former Smoker    Types: Cigarettes    Quit date: 06/30/2005  . Smokeless tobacco: Not on file     Comment: electronic cigarettes PRN  . Alcohol Use: No      Comment: quit: 2007  . Drug Use: No     Comment: occasional  . Sexual Activity: Not on file   Other Topics Concern  . Not on file   Social History Narrative   Patient lives at home with her spouse.   Caffeine Use: 5-7 caffeine drinks daily     Lab Results  Component Value Date   HGBA1C 7.7 12/15/2013    Review of Systems: Constitutional: negative for chills, fever, night sweats, weight changes, or fatigue  HEENT: negative for vision changes, hearing loss, congestion, rhinorrhea, or epistaxis Cardiovascular: negative for chest pain, palpitations, diaphoresis, DOE, orthopnea, or edema Respiratory: negative for hemoptysis, wheezing, shortness of breath, dyspnea, or cough Abdominal: negative for abdominal pain, nausea, vomiting, diarrhea, or constipation Dermatological: negative for rash, erythema, or wounds Neurologic: negative for headache, dizziness, or syncope Renal:  Negative for polyuria, polydipsia, or dysuria All other systems reviewed and are otherwise negative with the exception to those above and in the HPI.   Physical Exam: Blood pressure 100/76, pulse 72, resp. rate 18., There is no weight on file to calculate BMI. Wt Readings from Last 3 Encounters:  12/15/13 326 lb (147.873 kg)  11/24/13 326 lb (147.873 kg)  11/17/13 320 lb (145.151 kg)   BP Readings from Last 3 Encounters:  01/23/14 100/76  12/15/13 120/76  11/24/13 122/73   General: Well developed, well nourished, in no acute distress. Head: Normocephalic, atraumatic, eyes without discharge, sclera non-icteric, nares are without discharge. Bilateral auditory canals clear, TM's are without perforation, pearly grey and translucent with reflective cone of light bilaterally. Oral cavity moist, posterior pharynx without exudate, erythema, peritonsillar abscess, or post nasal drip.  Neck: Supple. No thyromegaly. Full ROM. No lymphadenopathy. Lungs: Clear bilaterally to auscultation without wheezes, rales, or rhonchi.  Breathing is unlabored. Heart: RRR with S1 S2. No murmurs, rubs, or gallops appreciated. Abdomen: Soft, non-tender, non-distended with normoactive bowel sounds. No hepatosplenomegaly. No rebound/guarding. No obvious abdominal masses. Msk:  Strength and tone normal for age. Extremities/Skin: Warm and dry. No clubbing or cyanosis. No edema. No rashes, wounds, or suspicious lesions. Monofilament exam decreased.  Neuro: Alert and oriented X 3. Moves all extremities spontaneously. Gait is normal. CNII-XII grossly in tact. Psych:  Responds to questions appropriately with a normal affect.   Results for orders placed in visit on 01/23/14  GLUCOSE, POCT (MANUAL RESULT ENTRY)      Result Value Ref Range   POC Glucose 217 (*) 70 - 99 mg/dl     ASSESSMENT AND PLAN:  53 y.o. year old female with morbid obesity and uncontrolled sugars. Type 2 diabetes mellitus - Plan: POCT glucose (manual entry), Empagliflozin (JARDIANCE) 10 MG TABS  -recheck one month  Signed, [name], MD 01/23/2014 8:15 AM

## 2014-01-25 ENCOUNTER — Telehealth: Payer: Self-pay

## 2014-01-25 NOTE — Telephone Encounter (Signed)
Completed PA on Jardiance on covermy meds. Pt has tried/failed metformin, Janumet, kombiglyze, and glyxambi previously.

## 2014-01-30 NOTE — Telephone Encounter (Signed)
PA approved for Jardiance through 01/27/15. Pharm notified

## 2014-02-01 ENCOUNTER — Other Ambulatory Visit: Payer: Self-pay | Admitting: Physician Assistant

## 2014-02-01 ENCOUNTER — Other Ambulatory Visit: Payer: Self-pay | Admitting: Family Medicine

## 2014-02-01 NOTE — Telephone Encounter (Signed)
Patient was here in May to see Dr Milus Glazier looks ok to refill Saree Krogh

## 2014-02-03 NOTE — Telephone Encounter (Signed)
Rx called in 

## 2014-02-16 ENCOUNTER — Ambulatory Visit: Payer: No Typology Code available for payment source | Admitting: Family Medicine

## 2014-04-06 ENCOUNTER — Encounter: Payer: Self-pay | Admitting: Family Medicine

## 2014-04-06 ENCOUNTER — Telehealth: Payer: Self-pay | Admitting: Radiology

## 2014-04-06 ENCOUNTER — Ambulatory Visit (INDEPENDENT_AMBULATORY_CARE_PROVIDER_SITE_OTHER): Payer: No Typology Code available for payment source | Admitting: Family Medicine

## 2014-04-06 VITALS — BP 118/70 | HR 112 | Temp 98.0°F | Resp 16 | Ht 64.75 in | Wt 317.2 lb

## 2014-04-06 DIAGNOSIS — H538 Other visual disturbances: Secondary | ICD-10-CM

## 2014-04-06 DIAGNOSIS — Z780 Asymptomatic menopausal state: Secondary | ICD-10-CM

## 2014-04-06 DIAGNOSIS — E119 Type 2 diabetes mellitus without complications: Secondary | ICD-10-CM

## 2014-04-06 DIAGNOSIS — N924 Excessive bleeding in the premenopausal period: Secondary | ICD-10-CM

## 2014-04-06 DIAGNOSIS — M5430 Sciatica, unspecified side: Secondary | ICD-10-CM

## 2014-04-06 DIAGNOSIS — M5440 Lumbago with sciatica, unspecified side: Secondary | ICD-10-CM

## 2014-04-06 DIAGNOSIS — G609 Hereditary and idiopathic neuropathy, unspecified: Secondary | ICD-10-CM

## 2014-04-06 DIAGNOSIS — N951 Menopausal and female climacteric states: Secondary | ICD-10-CM

## 2014-04-06 LAB — CBC WITH DIFFERENTIAL/PLATELET
Basophils Absolute: 0 10*3/uL (ref 0.0–0.1)
Basophils Relative: 0 % (ref 0–1)
Eosinophils Absolute: 0.5 10*3/uL (ref 0.0–0.7)
Eosinophils Relative: 5 % (ref 0–5)
HCT: 38.8 % (ref 36.0–46.0)
Hemoglobin: 13.1 g/dL (ref 12.0–15.0)
Lymphocytes Relative: 29 % (ref 12–46)
Lymphs Abs: 2.9 10*3/uL (ref 0.7–4.0)
MCH: 28.9 pg (ref 26.0–34.0)
MCHC: 33.8 g/dL (ref 30.0–36.0)
MCV: 85.7 fL (ref 78.0–100.0)
Monocytes Absolute: 0.5 10*3/uL (ref 0.1–1.0)
Monocytes Relative: 5 % (ref 3–12)
Neutro Abs: 6.1 10*3/uL (ref 1.7–7.7)
Neutrophils Relative %: 61 % (ref 43–77)
Platelets: 341 10*3/uL (ref 150–400)
RBC: 4.53 MIL/uL (ref 3.87–5.11)
RDW: 14.6 % (ref 11.5–15.5)
WBC: 10 10*3/uL (ref 4.0–10.5)

## 2014-04-06 LAB — COMPLETE METABOLIC PANEL WITH GFR
ALT: 15 U/L (ref 0–35)
AST: 14 U/L (ref 0–37)
Albumin: 3.8 g/dL (ref 3.5–5.2)
Alkaline Phosphatase: 104 U/L (ref 39–117)
BUN: 21 mg/dL (ref 6–23)
CO2: 25 mEq/L (ref 19–32)
Calcium: 8.8 mg/dL (ref 8.4–10.5)
Chloride: 102 mEq/L (ref 96–112)
Creat: 0.67 mg/dL (ref 0.50–1.10)
GFR, Est African American: >89 mL/min
GFR, Est Non African American: >89 mL/min
Glucose, Bld: 257 mg/dL — ABNORMAL HIGH (ref 70–99)
Potassium: 4.2 mEq/L (ref 3.5–5.3)
Sodium: 134 mEq/L — ABNORMAL LOW (ref 135–145)
Total Bilirubin: 0.3 mg/dL (ref 0.2–1.2)
Total Protein: 6.6 g/dL (ref 6.0–8.3)

## 2014-04-06 LAB — LIPID PANEL
Cholesterol: 220 mg/dL — ABNORMAL HIGH (ref 0–200)
HDL: 56 mg/dL (ref >39)
LDL Cholesterol: 134 mg/dL — ABNORMAL HIGH (ref 0–99)
Total CHOL/HDL Ratio: 3.9 Ratio
Triglycerides: 152 mg/dL — ABNORMAL HIGH (ref <150)
VLDL: 30 mg/dL (ref 0–40)

## 2014-04-06 LAB — POCT GLYCOSYLATED HEMOGLOBIN (HGB A1C): Hemoglobin A1C: 9.7

## 2014-04-06 MED ORDER — METHYLPREDNISOLONE ACETATE 80 MG/ML IJ SUSP
80.0000 mg | Freq: Once | INTRAMUSCULAR | Status: AC
  Administered 2014-04-06: 80 mg via INTRAMUSCULAR

## 2014-04-06 NOTE — Progress Notes (Signed)
Patient ID: Alicia Lucero MRN: [id number], DOB: 09/26/1960, 53 y.o. Date of Encounter: 04/06/2014, 12:21 PM  This chart was scribed for [name], MD by Julian Hy, ED Scribe. The patient was seen in Room 28. The patient's care was started at 12:21 PM.    Primary Physician: [name], MD  Chief Complaint: follow-up  HPI: 53 y.o. year old female with history below presents with diabetes follow-up. Pt reports having uncontrolled blood sugar rates. Pt states in the mornings her fingers are swollen and tight. Pt reports having some blood readings in the high 200's and 300's. Pt states she has had yeast infections in her mouth and external vagina. Pt reports using Braggs apple cider vinegar washes and Diflucan without relief. Pt reports stopping Jardiance, and her symptoms has since been resolved. Pt reports using 28 units of insulin. Pt states that she feels like she has Sciatica pain that runs down her right leg.  Pt reports taking Advil, Mobic, hydrocodone, and tylenol without relief for her sciatic pain. Pt states she has tingling and numbness in her hands and feet. Pt reports this numbness causes sleep disturbance. Pt states this is relatively controlled with Neurotonin although she denies being compliant. Pt reports she feels like she is going through menopause due to increased irritability, hot flashes, and night sweats. Pt denies vaginal dryness or vaginal discharge. Pt reports using a sleeping mask and noticed some blurred vision and reports it feels like there is sand or grit in her left eye onset 2 days ago. Pt states she goes to [employer] . Pt states she is having constant, moderate sharp pain in her upper back.  Pt states she will be due for her colonoscopy in September.  Pt reports eating in secret. Pt reports going out to eat hot dogs without anyone knowing. Pt reports her husband is going to Dr. Shawnee Knapp for low Testerone. Pt states she has been receiving Testerone shots. Pt  reports her husband's weight is 360. Pt reports stating . Pt states her sister was recently moved to [place] due to her sister having an abusive Pt reports she is working with her sister who has relapsing, remitting multiple sclerosis for 5 days a week. Pt reports helping her sister with swimming and has increased her exercise. Pt's mother died of lung cancer.   Past Medical History  Diagnosis Date  . Depression   . GERD (gastroesophageal reflux disease)   . Anxiety   . 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 Bipolar 1 disorder   . PCOS (polycystic ovarian syndrome)      Home Meds: Prior to Admission medications   Medication Sig Start Date End Date Taking? Authorizing Provider  ACCU-CHEK SOFTCLIX LANCETS lancets USE AS DIRECTED   Yes Nelva Nay, PA-C  aspirin EC 81 MG EC tablet Take 1 tablet (81 mg total) by mouth daily. 12/11/12  Yes Penny Pia, MD  B Complex-C (B-COMPLEX WITH VITAMIN C) tablet Take 1 tablet by mouth daily.   Yes Historical Provider, MD  clonazePAM (KLONOPIN) 2 MG tablet Take 1 tablet (2 mg total) by mouth 3 (three) times daily as needed for anxiety. 11/17/13  Yes [name], MD  fluconazole (DIFLUCAN) 150 MG tablet Take 1 tablet (150 mg total) by mouth once. 11/17/13  Yes [name], MD  gabapentin (NEURONTIN) 300 MG capsule Take 600 mg by mouth 3 (three) times daily as needed (for anxiety).   Yes Historical Provider, MD  glucose  blood (ACCU-CHEK AVIVA PLUS) test strip 1 each by Other route as needed for other. Use as instructed 08/18/13  Yes [name], MD  lamoTRIgine (LAMICTAL) 200 MG tablet Take 2 tablets (400 mg total) by mouth every evening. 11/17/13  Yes [name], MD  LEVEMIR FLEXTOUCH 100 UNIT/ML Pen INJECT 36 UNITS ONCE DAILY AS DIRECTED   Yes [name], MD  Multiple Vitamins-Minerals (WOMENS MULTIVITAMIN PLUS PO) Take 1 tablet by mouth daily.    Yes Historical Provider, MD  mupirocin ointment  (BACTROBAN) 2 % APPLY TO AFFECTED AREAS TWICE DAILY 09/22/13  Yes [name], MD  naproxen sodium (ANAPROX) 220 MG tablet Take 220 mg by mouth 2 (two) times daily as needed (for pain).   Yes Historical Provider, MD  Omega-3 Fatty Acids (FISH OIL PO) Take 2 capsules by mouth 2 (two) times daily.   Yes Historical Provider, MD  omeprazole (PRILOSEC) 40 MG capsule Take 1 capsule (40 mg total) by mouth daily. 05/05/13  Yes [name], MD  PRESCRIPTION MEDICATION kombiglyze taking for diabetes   Yes Historical Provider, MD  sertraline (ZOLOFT) 100 MG tablet Take 2 tablets (200 mg total) by mouth daily. 11/17/13  Yes [name], MD  traZODone (DESYREL) 100 MG tablet Take 1 tablet (100 mg total) by mouth at bedtime. 02/17/13  Yes [name], MD  fluconazole (DIFLUCAN) 150 MG tablet Take 1 tablet (150 mg total) by mouth once. 11/24/13   [name], MD  [facility] HFA 108 (90 BASE) MCG/ACT inhaler inhale 2 puffs INTO THE LUNGS every 6 hours if needed for wheezing 04/19/13   [name], MD    Allergies:  Allergies  Allergen Reactions  . Empagliflozin Other (See Comments)    Causes yeast infection  . Empagliflozin-Linagliptin Other (See Comments)    Causes yeast infection  . Other Nausea Only    States she can't tolerate seafood smell    History   Social History  . Marital Status: Married    Spouse Name: [name]    Number of Children: 0  . Years of Education: Assoc.   Occupational History  .  [employer]   Social History Main Topics  . Smoking status: Former Smoker    Types: Cigarettes    Quit date: 06/30/2005  . Smokeless tobacco: Not on file     Comment: electronic cigarettes PRN  . Alcohol Use: No     Comment: quit: 2007  . Drug Use: No     Comment: occasional  . Sexual Activity: Not on file   Other Topics Concern  . Not on file   Social History Narrative   Patient lives at home with her spouse.   Caffeine Use: 5-7 caffeine drinks daily     Review of  Systems: Constitutional: negative for chills, fever, or fatigue. Positive for night sweats and weight changes (loss). HEENT: negative for hearing loss, congestion, rhinorrhea, ST, epistaxis, or sinus pressure. Positive for vision changes. Cardiovascular: negative for chest pain or palpitations Respiratory: negative for hemoptysis, wheezing, shortness of breath, or cough Abdominal: negative for abdominal pain, nausea, vomiting, diarrhea, or constipation Dermatological: negative for rash Neurologic: negative for headache, dizziness, or syncope. Positive for numbness (hands and feet bilaterally), tingling (hands and feet bilaterally), and agitation. Musculoskeletal: Positive for arthralgias (upper back).  GU: Negative for vaginal dryness or vaginal discharge. All other systems reviewed and are otherwise negative with the exception to those above and in the HPI.   Physical Exam: Blood pressure 118/70, pulse 112, temperature 98  F (36.7 C), temperature source Oral, resp. rate 16, height 5' 4.75" (1.645 m), weight 317 lb 3.2 oz (143.881 kg), last menstrual period 04/04/2014, SpO2 94.00%., Body mass index is 53.17 kg/(m^2). General: Well developed, well nourished, in no acute distress. Head: Normocephalic, atraumatic, eyes without discharge, sclera non-icteric, nares are without discharge. Bilateral auditory canals clear, TM's are without perforation, pearly grey and translucent with reflective cone of light bilaterally. Oral cavity moist, posterior pharynx without exudate, erythema, peritonsillar abscess, or post nasal drip.  Horizontal viscous line in left lens, fundi without hemorrhages or exudates.  Neck: Supple. No thyromegaly. Full ROM. No lymphadenopathy.  Lungs: Clear bilaterally to auscultation without wheezes, rales, or rhonchi. Breathing is unlabored. Heart: RRR with S1 S2. No murmurs, rubs, or gallops appreciated. Abdomen: Soft, non-tender, non-distended with normoactive bowel sounds. No  hepatomegaly. No rebound/guarding. No obvious abdominal masses. Msk:  Strength and tone normal for age. Extremities/Skin: Warm and dry. No clubbing or cyanosis. No edema. No rashes or suspicious lesions. Neuro: Alert and oriented X 3. Moves all extremities spontaneously. Gait is normal. CNII-XII grossly in tact. Psych:  Responds to questions appropriately with a normal affect.   Results for orders placed in visit on 04/06/14  POCT GLYCOSYLATED HEMOGLOBIN (HGB A1C)      Result Value Ref Range   Hemoglobin A1C 9.7        ASSESSMENT AND PLAN:  53 y.o. year old female with multiple problems referable to her weight.  She is having difficulty with diabetic control, menorrhagia, peripheral neuropathy, back pain, blurry vision and sciatica. She is also very anxious and has features of bipolar illness.  She is helping her sister Maria(on massive doses of controlled substances) with her disabling MS which provides income and activity. Type II or unspecified type diabetes mellitus without mention of complication, not stated as uncontrolled - Plan: COMPLETE METABOLIC PANEL WITH GFR, CBC with Differential, Lipid panel, POCT glycosylated hemoglobin (Hb A1C), Ambulatory referral to Ophthalmology, Ambulatory referral to Endocrinology  Sciatic nerve pain, unspecified laterality - Plan: CBC with Differential, methylPREDNISolone acetate (DEPO-MEDROL) injection 80 mg  Midline low back pain with sciatica, sciatica laterality unspecified - Plan: COMPLETE METABOLIC PANEL WITH GFR, methylPREDNISolone acetate (DEPO-MEDROL) injection 80 mg  Blurred vision - Plan: COMPLETE METABOLIC PANEL WITH GFR, CBC with Differential, Ambulatory referral to Ophthalmology, Ambulatory referral to Endocrinology  Menopause - Plan: Ambulatory referral to Endocrinology  Climacteric menorrhagia - Plan: Ambulatory referral to Gynecology  Unspecified hereditary and idiopathic peripheral neuropathy  Follow up one  month   Signed, [name], MD 04/06/2014 12:46 PM

## 2014-04-06 NOTE — Telephone Encounter (Signed)
Phone call to patient Dr Milus GlazierLauenstein has indicated he does want patient to see a gynecologist. I have called her to advise, and she will await our call to schedule.

## 2014-05-05 ENCOUNTER — Other Ambulatory Visit: Payer: Self-pay | Admitting: Family Medicine

## 2014-05-11 ENCOUNTER — Ambulatory Visit: Payer: No Typology Code available for payment source | Admitting: Family Medicine

## 2014-05-25 ENCOUNTER — Ambulatory Visit (INDEPENDENT_AMBULATORY_CARE_PROVIDER_SITE_OTHER): Payer: No Typology Code available for payment source | Admitting: Family Medicine

## 2014-05-26 ENCOUNTER — Encounter: Payer: Self-pay | Admitting: Family Medicine

## 2014-05-26 NOTE — Progress Notes (Signed)
No show

## 2014-06-01 ENCOUNTER — Other Ambulatory Visit: Payer: Self-pay | Admitting: Family Medicine

## 2014-06-02 ENCOUNTER — Telehealth: Payer: Self-pay | Admitting: *Deleted

## 2014-06-02 ENCOUNTER — Other Ambulatory Visit: Payer: Self-pay | Admitting: Family Medicine

## 2014-06-11 ENCOUNTER — Other Ambulatory Visit: Payer: Self-pay | Admitting: Family Medicine

## 2014-07-10 ENCOUNTER — Encounter: Payer: Self-pay | Admitting: Family Medicine

## 2014-07-20 ENCOUNTER — Encounter: Payer: Self-pay | Admitting: Family Medicine

## 2014-07-20 ENCOUNTER — Ambulatory Visit (INDEPENDENT_AMBULATORY_CARE_PROVIDER_SITE_OTHER): Payer: No Typology Code available for payment source | Admitting: Family Medicine

## 2014-07-20 VITALS — BP 122/79 | HR 107 | Temp 98.5°F | Resp 18 | Ht 64.0 in | Wt 312.0 lb

## 2014-07-20 DIAGNOSIS — Z79899 Other long term (current) drug therapy: Secondary | ICD-10-CM

## 2014-07-20 DIAGNOSIS — K589 Irritable bowel syndrome without diarrhea: Secondary | ICD-10-CM

## 2014-07-20 DIAGNOSIS — E119 Type 2 diabetes mellitus without complications: Secondary | ICD-10-CM

## 2014-07-20 DIAGNOSIS — Z23 Encounter for immunization: Secondary | ICD-10-CM

## 2014-07-20 DIAGNOSIS — F4323 Adjustment disorder with mixed anxiety and depressed mood: Secondary | ICD-10-CM

## 2014-07-20 DIAGNOSIS — M7061 Trochanteric bursitis, right hip: Secondary | ICD-10-CM

## 2014-07-20 DIAGNOSIS — N951 Menopausal and female climacteric states: Secondary | ICD-10-CM

## 2014-07-20 DIAGNOSIS — M7062 Trochanteric bursitis, left hip: Secondary | ICD-10-CM

## 2014-07-20 LAB — POCT URINALYSIS DIPSTICK
Bilirubin, UA: NEGATIVE
Blood, UA: NEGATIVE
Glucose, UA: NEGATIVE
Leukocytes, UA: NEGATIVE
Nitrite, UA: NEGATIVE
Protein, UA: NEGATIVE
Spec Grav, UA: 1.02
Urobilinogen, UA: 0.2
pH, UA: 5

## 2014-07-20 LAB — POCT CBC
Granulocyte percent: 71.1 %G (ref 37–80)
HCT, POC: 43.2 % (ref 37.7–47.9)
Hemoglobin: 13.8 g/dL (ref 12.2–16.2)
Lymph, poc: 3.2 (ref 0.6–3.4)
MCH, POC: 29.5 pg (ref 27–31.2)
MCHC: 32 g/dL (ref 31.8–35.4)
MCV: 92.3 fL (ref 80–97)
MID (cbc): 0.5 (ref 0–0.9)
MPV: 7.9 fL (ref 0–99.8)
POC Granulocyte: 9.1 — AB (ref 2–6.9)
POC LYMPH PERCENT: 25.1 %L (ref 10–50)
POC MID %: 3.8 %M (ref 0–12)
Platelet Count, POC: 353 10*3/uL (ref 142–424)
RBC: 4.68 M/uL (ref 4.04–5.48)
RDW, POC: 16.3 %
WBC: 4.6 10*3/uL (ref 4.6–10.2)

## 2014-07-20 LAB — POCT UA - MICROSCOPIC ONLY
Casts, Ur, LPF, POC: NEGATIVE
Crystals, Ur, HPF, POC: NEGATIVE
Mucus, UA: NEGATIVE
RBC, urine, microscopic: NEGATIVE
WBC, Ur, HPF, POC: NEGATIVE
Yeast, UA: NEGATIVE

## 2014-07-20 LAB — LIPID PANEL
Cholesterol: 190 mg/dL (ref 0–200)
HDL: 57 mg/dL (ref >39)
LDL Cholesterol: 98 mg/dL (ref 0–99)
Total CHOL/HDL Ratio: 3.3 Ratio
Triglycerides: 176 mg/dL — ABNORMAL HIGH (ref <150)
VLDL: 35 mg/dL (ref 0–40)

## 2014-07-20 LAB — COMPREHENSIVE METABOLIC PANEL
ALT: 12 U/L (ref 0–35)
AST: 12 U/L (ref 0–37)
Albumin: 4.1 g/dL (ref 3.5–5.2)
Alkaline Phosphatase: 104 U/L (ref 39–117)
BUN: 13 mg/dL (ref 6–23)
CO2: 24 mEq/L (ref 19–32)
Calcium: 9.7 mg/dL (ref 8.4–10.5)
Chloride: 105 mEq/L (ref 96–112)
Creat: 0.61 mg/dL (ref 0.50–1.10)
Glucose, Bld: 156 mg/dL — ABNORMAL HIGH (ref 70–99)
Potassium: 4.1 mEq/L (ref 3.5–5.3)
Sodium: 140 mEq/L (ref 135–145)
Total Bilirubin: 0.3 mg/dL (ref 0.2–1.2)
Total Protein: 7 g/dL (ref 6.0–8.3)

## 2014-07-20 LAB — GLUCOSE, POCT (MANUAL RESULT ENTRY): POC Glucose: 170 mg/dl — AB (ref 70–99)

## 2014-07-20 LAB — POCT GLYCOSYLATED HEMOGLOBIN (HGB A1C): Hemoglobin A1C: 7.4

## 2014-07-20 MED ORDER — CLONAZEPAM 2 MG PO TABS: ORAL_TABLET | ORAL | Status: DC

## 2014-07-20 NOTE — Patient Instructions (Addendum)
Try the Capsaicin cream on hands and feet I'm referring you to Dr. Mann for the IBS/diabetic autonomic dysfunctionLoreta Ave I'm referring you to [facility] for the hip pain.  Generalized Anxiety Disorder Generalized anxiety disorder (GAD) is a mental disorder. It interferes with life functions, including relationships, work, and school. GAD is different from normal anxiety, which everyone experiences at some point in their lives in response to specific life events and activities. Normal anxiety actually helps us prepare for and get through these life events and activities. Normal anxiety goes away after the event or activity is over.  GAD causes anxiety that is not necessarily related to specific events or activities. It also causes excess anxiety in proportion to specific events or activities. The anxiety associated with GAD is also difficult to control. GAD can vary from mild to severe. People with severe GAD can have intense waves of anxiety with physical symptoms (panic attacks).  SYMPTOMS The anxiety and worry associated with GAD are difficult to control. This anxiety and worry are related to many life events and activities and also occur more days than not for 6 months or longer. People with GAD also have three or more of the following symptoms (one or more in children):  Restlessness.   Fatigue.  Difficulty concentrating.   Irritability.  Muscle tension.  Difficulty sleeping or unsatisfying sleep. DIAGNOSIS GAD is diagnosed through an assessment by your health care provider. Your health care provider will ask you questions aboutyour mood,physical symptoms, and events in your life. Your health care provider may ask you about your medical history and use of alcohol or drugs, including prescription medicines. Your health care provider may also do a physical exam and blood tests. Certain medical conditions and the use of certain substances can cause symptoms similar to those associated with  GAD. Your health care provider may refer you to a mental health specialist for further evaluation. TREATMENT The following therapies are usually used to treat GAD:   Medication. Antidepressant medication usually is prescribed for long-term daily control. Antianxiety medicines may be added in severe cases, especially when panic attacks occur.   Talk therapy (psychotherapy). Certain types of talk therapy can be helpful in treating GAD by providing support, education, and guidance. A form of talk therapy called cognitive behavioral therapy can teach you healthy ways to think about and react to daily life events and activities.  Stress managementtechniques. These include yoga, meditation, and exercise and can be very helpful when they are practiced regularly. A mental health specialist can help determine which treatment is best for you. Some people see improvement with one therapy. However, other people require a combination of therapies. Document Released: 12/20/2012 Document Revised: 01/09/2014 Document Reviewed: 12/20/2012 [facility] Patient Information 2015 [place], [place]. This information is not intended to replace advice given to you by your health care provider. Make sure you discuss any questions you have with your health care provider.

## 2014-07-20 NOTE — Progress Notes (Signed)
Subjective:    Patient ID: Alicia Lucero, female    DOB: 10/25/1960, 53 y.o.   MRN: [id number] This chart was scribed for [name], MD by Littie Deeds, Medical Scribe. This patient was seen in Room 25 and the patient's care was started at 8:51 AM.   HPI HPI Comments: Alicia Lucero is a 53 y.o. female with a hx of morbid obesity and DM type I who presents to the [facility] for a follow-up for diabetes.   Patient notes having a burning pain in her anterior neck area. She denies any difficulty swallowing.   Patient also notes having a cramping flank pain and diarrhea that she thinks is due to IBS. She denies blood in her stool. She has not had a colonoscopy.   Patient has been seeing Dr. Lalla Brothers, who put her on metformin and Victoza; she has not had a CBG reading of over 160 since being on Victoza.   Patient is on Neurontin; she has been noticing more tingling, pain and itching on her feet. She notes having fatigue that began recently that she thinks may be due to the Neurontin. Patient also notes having some occasional speech difficulty (difficulty finding the right words) that she thinks may be due to Neurontin.  She also notes having had behavioral issues and anger issues; she has been taking Klonopin 3 times a day. She states the Klonopin has been helping her sleep.  Patient notes having hip pain and tailbone pain; she has not had any falls and does not know the cause of her pain.  She has had severe cramping pains and has had some problems starting her period.  Review of Systems  Constitutional: Positive for fatigue.  HENT: Negative for trouble swallowing.   Gastrointestinal: Positive for abdominal pain and diarrhea.  Genitourinary: Positive for flank pain and menstrual problem.  Musculoskeletal: Positive for myalgias, arthralgias and neck pain.  Neurological: Positive for numbness.  Psychiatric/Behavioral: Positive for behavioral problems and agitation.        Objective:   Physical Exam CONSTITUTIONAL: Well developed HEAD: Normocephalic/atraumatic EYES: EOM/PERRL ENMT: Mucous membranes moist NECK: supple no meningeal signs SPINE: entire spine nontender CV: S1/S2 noted, no murmurs/rubs/gallops noted LUNGS: Lungs are clear to auscultation bilaterally, no apparent distress ABDOMEN: soft, nontender, no rebound or guarding GU: no cva tenderness NEURO: Pt is awake/alert, moves all extremitiesx4 EXTREMITIES: pulses normal, full ROM. TTP hip. SKIN: warm, color normal PSYCH: no abnormalities of mood noted  Results for orders placed or performed in visit on 07/20/14  POCT glucose (manual entry)  Result Value Ref Range   POC Glucose 170 (A) 70 - 99 mg/dl  POCT glycosylated hemoglobin (Hb A1C)  Result Value Ref Range   Hemoglobin A1C 7.4   POCT CBC  Result Value Ref Range   WBC 4.6 4.6 - 10.2 K/uL   Lymph, poc 3.2 0.6 - 3.4   POC LYMPH PERCENT 25.1 10 - 50 %L   MID (cbc) 0.5 0 - 0.9   POC MID % 3.8 0 - 12 %M   POC Granulocyte 9.1 (A) 2 - 6.9   Granulocyte percent 71.1 37 - 80 %G   RBC 4.68 4.04 - 5.48 M/uL   Hemoglobin 13.8 12.2 - 16.2 g/dL   HCT, POC 40.9 81.1 - 47.9 %   MCV 92.3 80 - 97 fL   MCH, POC 29.5 27 - 31.2 pg   MCHC 32.0 31.8 - 35.4 g/dL   RDW, POC 91.4 %  Platelet Count, POC 353 142 - 424 K/uL   MPV 7.9 0 - 99.8 fL  POCT urinalysis dipstick  Result Value Ref Range   Color, UA yellow    Clarity, UA cloudy    Glucose, UA neg    Bilirubin, UA neg    Ketones, UA trace    Spec Grav, UA 1.020    Blood, UA neg    pH, UA 5.0    Protein, UA neg    Urobilinogen, UA 0.2    Nitrite, UA neg    Leukocytes, UA Negative   POCT UA - Microscopic Only  Result Value Ref Range   WBC, Ur, HPF, POC neg    RBC, urine, microscopic neg    Bacteria, U Microscopic 1+    Mucus, UA neg    Epithelial cells, urine per micros 5-7    Crystals, Ur, HPF, POC neg    Casts, Ur, LPF, POC neg    Yeast, UA neg          Assessment &  Plan:   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 This affects her relationships as well as her eating habits. That also interferes with exercise.  Need for prophylactic vaccination and inoculation against influenza - Plan: Flu Vaccine QUAD 36+ mos IM, Comprehensive metabolic panel, Lipid panel, POCT CBC  Diabetes mellitus type 2, controlled, without complications - Plan: POCT glucose (manual entry), POCT glycosylated hemoglobin (Hb A1C)  Adjustment disorder with mixed anxiety and depressed mood - Plan: clonazePAM (KLONOPIN) 2 MG tablet  IBS (irritable bowel syndrome) - Plan: Ambulatory referral to Gastroenterology  Trochanteric bursitis of both hips - Plan: Ambulatory referral to Orthopedic Surgery  High risk medication use - Plan: POCT urinalysis dipstick, POCT UA - Microscopic Only  Perimenopausal symptoms - Plan: Ambulatory referral to Gynecology  Signed, [name], MD

## 2014-08-18 ENCOUNTER — Other Ambulatory Visit: Payer: Self-pay | Admitting: Physician Assistant

## 2014-08-18 NOTE — Telephone Encounter (Signed)
Dr L, pt had check up in Nov, but don't see this med discussed recently. OK to RF?

## 2014-08-24 ENCOUNTER — Ambulatory Visit (INDEPENDENT_AMBULATORY_CARE_PROVIDER_SITE_OTHER): Payer: No Typology Code available for payment source | Admitting: Family Medicine

## 2014-08-24 ENCOUNTER — Encounter: Payer: Self-pay | Admitting: Family Medicine

## 2014-08-24 VITALS — BP 110/82 | HR 71 | Temp 98.0°F | Resp 16 | Ht 64.0 in | Wt 309.8 lb

## 2014-08-24 DIAGNOSIS — Z00129 Encounter for routine child health examination without abnormal findings: Secondary | ICD-10-CM

## 2014-08-24 DIAGNOSIS — R399 Unspecified symptoms and signs involving the genitourinary system: Secondary | ICD-10-CM

## 2014-08-24 LAB — POCT URINALYSIS DIPSTICK
Blood, UA: NEGATIVE
Glucose, UA: 100
Ketones, UA: NEGATIVE
Leukocytes, UA: NEGATIVE
Nitrite, UA: POSITIVE
Protein, UA: 30
Spec Grav, UA: >=1.03
Urobilinogen, UA: 1
pH, UA: 5

## 2014-08-24 MED ORDER — FLUCONAZOLE 150 MG PO TABS: 150.0000 mg | ORAL_TABLET | Freq: Once | ORAL | Status: DC

## 2014-08-24 MED ORDER — CIPROFLOXACIN HCL 500 MG PO TABS: 500.0000 mg | ORAL_TABLET | Freq: Two times a day (BID) | ORAL | Status: DC

## 2014-08-24 NOTE — Progress Notes (Signed)
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  Recently having some burning.  Urine is dark amber.  Tried azo to help with the burning.  No nocturia   Objective:  Alert, cheerful  Wt Readings from Last 3 Encounters:  08/24/14 309 lb 12.8 oz (140.524 kg)  07/20/14 312 lb (141.522 kg)  04/06/14 317 lb 3.2 oz (143.881 kg)   Results for orders placed or performed in visit on 07/20/14  Comprehensive metabolic panel  Result Value Ref Range   Sodium 140 135 - 145 mEq/L   Potassium 4.1 3.5 - 5.3 mEq/L   Chloride 105 96 - 112 mEq/L   CO2 24 19 - 32 mEq/L   Glucose, Bld 156 (H) 70 - 99 mg/dL   BUN 13 6 - 23 mg/dL   Creat 1.610.61 0.960.50 - 0.451.10 mg/dL   Total Bilirubin 0.3 0.2 - 1.2 mg/dL   Alkaline Phosphatase 104 39 - 117 U/L   AST 12 0 - 37 U/L   ALT 12 0 - 35 U/L   Total Protein 7.0 6.0 - 8.3 g/dL   Albumin 4.1 3.5 - 5.2 g/dL   Calcium 9.7 8.4 - 40.910.5 mg/dL  Lipid panel  Result Value Ref Range   Cholesterol 190 0 - 200 mg/dL   Triglycerides 811176 (H) <150 mg/dL   HDL 57 >91>39 mg/dL   Total CHOL/HDL Ratio 3.3 Ratio   VLDL 35 0 - 40 mg/dL   LDL Cholesterol 98 0 - 99 mg/dL  POCT glucose (manual entry)  Result Value Ref Range   POC Glucose 170 (A) 70 - 99 mg/dl  POCT glycosylated hemoglobin (Hb A1C)  Result Value Ref Range   Hemoglobin A1C 7.4   POCT CBC  Result Value Ref Range   WBC 4.6 4.6 - 10.2 K/uL   Lymph, poc 3.2 0.6 - 3.4   POC LYMPH PERCENT 25.1 10 - 50 %L   MID (cbc) 0.5 0 - 0.9   POC MID % 3.8 0 - 12 %M   POC Granulocyte 9.1 (A) 2 - 6.9   Granulocyte percent 71.1 37 - 80 %G   RBC 4.68 4.04 - 5.48 M/uL   Hemoglobin 13.8 12.2 - 16.2 g/dL   HCT, POC 47.843.2 29.537.7 - 47.9 %   MCV 92.3 80 - 97 fL   MCH, POC 29.5 27 - 31.2 pg   MCHC 32.0 31.8 - 35.4 g/dL   RDW, POC 62.116.3 %   Platelet Count, POC 353 142 - 424 K/uL   MPV 7.9 0 - 99.8 fL  POCT urinalysis dipstick  Result Value Ref Range   Color, UA yellow    Clarity,  UA cloudy    Glucose, UA neg    Bilirubin, UA neg    Ketones, UA trace    Spec Grav, UA 1.020    Blood, UA neg    pH, UA 5.0    Protein, UA neg    Urobilinogen, UA 0.2    Nitrite, UA neg    Leukocytes, UA Negative   POCT UA - Microscopic Only  Result Value Ref Range   WBC, Ur, HPF, POC neg    RBC, urine, microscopic neg    Bacteria, U Microscopic 1+    Mucus, UA neg    Epithelial cells, urine per micros 5-7    Crystals, Ur, HPF, POC neg    Casts, Ur, LPF, POC neg    Yeast, UA neg  Assessment:  Patient is gradually losing weight and maintaining healthy sugars.  I am encouraged by her progress and hopeful that her new lifestyle will result in continued weight loss and diabetic control.    ICD-9-CM ICD-10-CM   1. Urinary tract infection symptoms 788.99 R39.9 POCT urinalysis dipstick     Urine culture  2. Encounter for routine preventive care for patient older than 28 days V20.2 Z00.129 Ambulatory referral to Gastroenterology   Recheck 3 months  Signed, Elvina SidleKurt Sai Moura, MD

## 2014-08-25 LAB — URINE CULTURE
Colony Count: NO GROWTH
Organism ID, Bacteria: NO GROWTH

## 2014-10-08 ENCOUNTER — Ambulatory Visit (INDEPENDENT_AMBULATORY_CARE_PROVIDER_SITE_OTHER): Payer: 59 | Admitting: Family Medicine

## 2014-10-08 ENCOUNTER — Telehealth: Payer: Self-pay

## 2014-10-08 VITALS — BP 112/80 | HR 90 | Temp 98.8°F | Resp 20 | Ht 64.25 in | Wt 309.0 lb

## 2014-10-08 DIAGNOSIS — R300 Dysuria: Secondary | ICD-10-CM

## 2014-10-08 DIAGNOSIS — R062 Wheezing: Secondary | ICD-10-CM

## 2014-10-08 DIAGNOSIS — H6501 Acute serous otitis media, right ear: Secondary | ICD-10-CM

## 2014-10-08 DIAGNOSIS — M25551 Pain in right hip: Secondary | ICD-10-CM

## 2014-10-08 LAB — POCT URINALYSIS DIPSTICK
Bilirubin, UA: NEGATIVE
Blood, UA: NEGATIVE
Glucose, UA: NEGATIVE
Ketones, UA: NEGATIVE
Nitrite, UA: NEGATIVE
Protein, UA: NEGATIVE
Spec Grav, UA: 1.025
Urobilinogen, UA: 0.2
pH, UA: 5

## 2014-10-08 LAB — POCT UA - MICROSCOPIC ONLY
Casts, Ur, LPF, POC: NEGATIVE
Crystals, Ur, HPF, POC: NEGATIVE
Mucus, UA: NEGATIVE
Yeast, UA: NEGATIVE

## 2014-10-08 MED ORDER — HYDROCODONE-HOMATROPINE 5-1.5 MG/5ML PO SYRP
5.0000 mL | ORAL_SOLUTION | Freq: Three times a day (TID) | ORAL | Status: DC | PRN
Start: 2014-10-08 — End: 2014-12-21

## 2014-10-08 MED ORDER — LEVOFLOXACIN 500 MG PO TABS: 500.0000 mg | ORAL_TABLET | Freq: Every day | ORAL | Status: DC

## 2014-10-08 MED ORDER — ALBUTEROL SULFATE HFA 108 (90 BASE) MCG/ACT IN AERS: 2.0000 | INHALATION_SPRAY | Freq: Four times a day (QID) | RESPIRATORY_TRACT | Status: DC | PRN

## 2014-10-08 NOTE — Telephone Encounter (Signed)
Patient states that she has called [employer] several times to list [facility] as the primary care office but it still hasn't been changed. Patient was told that she could be seen today and she was advised to called [employer] first thing in the morning to add out office as her primary in order for her visit to be fully covered through the co payment.

## 2014-10-08 NOTE — Patient Instructions (Signed)
Bronchospasm A bronchospasm is when the tubes that carry air in and out of your lungs (airways) spasm or tighten. During a bronchospasm it is hard to breathe. This is because the airways get smaller. A bronchospasm can be triggered by:  Allergies. These may be to animals, pollen, food, or mold.  Infection. This is a common cause of bronchospasm.  Exercise.  Irritants. These include pollution, cigarette smoke, strong odors, aerosol sprays, and paint fumes.  Weather changes.  Stress.  Being emotional. HOME CARE   Always have a plan for getting help. Know when to call your doctor and local emergency services (911 in the [place] Know where you can get emergency care.  Only take medicines as told by your doctor.  If you were prescribed an inhaler or nebulizer machine, ask your doctor how to use it correctly. Always use a spacer with your inhaler if you were given one.  Stay calm during an attack. Try to relax and breathe more slowly.  Control your home environment:  Change your heating and air conditioning filter at least once a month.  Limit your use of fireplaces and wood stoves.  Do not  smoke. Do not  allow smoking in your home.  Avoid perfumes and fragrances.  Get rid of pests (such as roaches and mice) and their droppings.  Throw away plants if you see mold on them.  Keep your house clean and dust free.  Replace carpet with wood, tile, or vinyl flooring. Carpet can trap dander and dust.  Use allergy-proof pillows, mattress covers, and box spring covers.  Wash bed sheets and blankets every week in hot water. Dry them in a dryer.  Use blankets that are made of polyester or cotton.  Wash hands frequently. GET HELP IF:  You have muscle aches.  You have chest pain.  The thick spit you spit or cough up (sputum) changes from clear or white to yellow, green, gray, or bloody.  The thick spit you spit or cough up gets thicker.  There are problems that may be related  to the medicine you are given such as:  A rash.  Itching.  Swelling.  Trouble breathing. GET HELP RIGHT AWAY IF:  You feel you cannot breathe or catch your breath.  You cannot stop coughing.  Your treatment is not helping you breathe better.  You have very bad chest pain. MAKE SURE YOU:   Understand these instructions.  Will watch your condition.  Will get help right away if you are not doing well or get worse. Document Released: 06/22/2009 Document Revised: 08/30/2013 Document Reviewed: 02/15/2013 [employer] Patient Information 2015 [employer]. This information is not intended to replace advice given to you by your health care provider. Make sure you discuss any questions you have with your health care provider.  

## 2014-10-08 NOTE — Progress Notes (Signed)
° °  Subjective:    Patient ID: Alicia Lucero, female    DOB: 1961-02-16, 54 y.o.   MRN: [id number] This chart was scribed for Elvina SidleKurt Lauenstein, MD by Littie Deedsichard Sun, Medical Scribe. This patient was seen in Room 9 and the patient's care was started at 1:37 PM.   HPI HPI Comments: Alicia Lucero is a 54 y.o. female with a hx of bronchitis, obesity and DM type I who presents to the [facility] complaining of gradual onset, progressively worsening dry cough that started 4 days ago. She also reports having fluid in her ears (more in right) and voice change. The cough has been keeping her awake at night. Patient has been on Z-pack before and used an inhaler before, but does not currently have one. Patient has been working on losing weight. She has not been measuring her blood sugar and has had difficulty getting Victoza due to issues with insurance.  Patient also reports having pelvic pain and urinary frequency. She denies dysuria.    Review of Systems  HENT: Positive for ear discharge and voice change.   Respiratory: Positive for cough.   Genitourinary: Positive for frequency and pelvic pain. Negative for dysuria.       Objective:   Physical Exam CONSTITUTIONAL: Well developed/well nourished HEAD: Normocephalic/atraumatic EYES: EOM/PERRL ENMT: Mucous membranes moist. Small fluid level in the right ear. NECK: supple no meningeal signs SPINE: entire spine nontender CV: S1/S2 noted, no murmurs/rubs/gallops noted LUNGS:  ABDOMEN: soft, nontender, no rebound or guarding GU: no cva tenderness NEURO: Pt is awake/alert, moves all extremitiesx4 EXTREMITIES: pulses normal, full ROM SKIN: warm, color normal PSYCH: no abnormalities of mood noted        Assessment & Plan:   This chart was scribed in my presence and reviewed by me personally.    ICD-9-CM ICD-10-CM   1. Dysuria 788.1 R30.0 POCT urinalysis dipstick     POCT UA - Microscopic Only     levofloxacin (LEVAQUIN) 500 MG  tablet     Urine culture  2. Pain in joint, pelvic region and thigh, right 719.45 M25.551 levofloxacin (LEVAQUIN) 500 MG tablet     Urine culture  3. Wheezing 786.07 R06.2 albuterol (PROVENTIL HFA;VENTOLIN HFA) 108 (90 BASE) MCG/ACT inhaler     HYDROcodone-homatropine (HYCODAN) 5-1.5 MG/5ML syrup  4. Right acute serous otitis media, recurrence not specified 381.01 H65.01 levofloxacin (LEVAQUIN) 500 MG tablet     Signed, Elvina SidleKurt Lauenstein, MD

## 2014-10-09 ENCOUNTER — Other Ambulatory Visit: Payer: Self-pay | Admitting: *Deleted

## 2014-10-09 MED ORDER — INSULIN PEN NEEDLE 31G X 8 MM MISC: 1.0000 | Freq: Every day | Status: DC

## 2014-10-10 LAB — URINE CULTURE
Colony Count: NO GROWTH
Organism ID, Bacteria: NO GROWTH

## 2014-10-16 ENCOUNTER — Telehealth: Payer: Self-pay

## 2014-10-16 NOTE — Telephone Encounter (Signed)
Pt states she was seen by Dr.Kurt and her throat and ears are still bothering her but she doesn't have the UTI any more. Please call

## 2014-10-16 NOTE — Telephone Encounter (Signed)
Patient called to check the status of her previous message. Message was not routed anywhere. Clinical please advise.   [phone]

## 2014-10-17 ENCOUNTER — Other Ambulatory Visit: Payer: Self-pay | Admitting: Family Medicine

## 2014-10-17 DIAGNOSIS — R300 Dysuria: Secondary | ICD-10-CM

## 2014-10-17 DIAGNOSIS — H6501 Acute serous otitis media, right ear: Secondary | ICD-10-CM

## 2014-10-17 DIAGNOSIS — M25551 Pain in right hip: Secondary | ICD-10-CM

## 2014-10-17 MED ORDER — LEVOFLOXACIN 500 MG PO TABS: 500.0000 mg | ORAL_TABLET | Freq: Every day | ORAL | Status: DC

## 2014-10-17 NOTE — Telephone Encounter (Signed)
Called pt, Left message for pt to call back.  

## 2014-10-17 NOTE — Telephone Encounter (Signed)
Any plan while waiting for pt to call back Dr. LElbert Ewings

## 2014-11-16 NOTE — Telephone Encounter (Signed)
Pt was put on Jardiance.

## 2014-11-30 ENCOUNTER — Encounter: Payer: 59 | Admitting: Family Medicine

## 2014-11-30 ENCOUNTER — Ambulatory Visit: Payer: 59 | Admitting: Family Medicine

## 2014-12-21 ENCOUNTER — Encounter: Payer: Self-pay | Admitting: Family Medicine

## 2014-12-21 ENCOUNTER — Ambulatory Visit (INDEPENDENT_AMBULATORY_CARE_PROVIDER_SITE_OTHER): Payer: 59 | Admitting: Family Medicine

## 2014-12-21 VITALS — BP 119/77 | HR 73 | Temp 98.2°F | Resp 16 | Ht 64.0 in | Wt 311.0 lb

## 2014-12-21 DIAGNOSIS — Z0000 Encounter for general adult medical examination without abnormal findings: Secondary | ICD-10-CM

## 2014-12-21 DIAGNOSIS — F4323 Adjustment disorder with mixed anxiety and depressed mood: Secondary | ICD-10-CM | POA: Diagnosis not present

## 2014-12-21 DIAGNOSIS — F3131 Bipolar disorder, current episode depressed, mild: Secondary | ICD-10-CM

## 2014-12-21 DIAGNOSIS — Z1231 Encounter for screening mammogram for malignant neoplasm of breast: Secondary | ICD-10-CM | POA: Diagnosis not present

## 2014-12-21 DIAGNOSIS — K589 Irritable bowel syndrome without diarrhea: Secondary | ICD-10-CM

## 2014-12-21 DIAGNOSIS — E119 Type 2 diabetes mellitus without complications: Secondary | ICD-10-CM | POA: Diagnosis not present

## 2014-12-21 DIAGNOSIS — N926 Irregular menstruation, unspecified: Secondary | ICD-10-CM

## 2014-12-21 DIAGNOSIS — Z23 Encounter for immunization: Secondary | ICD-10-CM

## 2014-12-21 LAB — POCT UA - MICROSCOPIC ONLY
Casts, Ur, LPF, POC: NEGATIVE
Crystals, Ur, HPF, POC: POSITIVE
Mucus, UA: NEGATIVE
Yeast, UA: NEGATIVE

## 2014-12-21 LAB — POCT URINALYSIS DIPSTICK
Bilirubin, UA: NEGATIVE
Glucose, UA: NEGATIVE
Ketones, UA: NEGATIVE
Leukocytes, UA: NEGATIVE
Nitrite, UA: NEGATIVE
Spec Grav, UA: >=1.03
Urobilinogen, UA: 0.2
pH, UA: 5

## 2014-12-21 LAB — GLUCOSE, POCT (MANUAL RESULT ENTRY): POC Glucose: 169 mg/dl — AB (ref 70–99)

## 2014-12-21 LAB — POCT GLYCOSYLATED HEMOGLOBIN (HGB A1C): Hemoglobin A1C: 6.6

## 2014-12-21 MED ORDER — ZOSTER VACCINE LIVE 19400 UNT/0.65ML ~~LOC~~ SOLR: 0.6500 mL | Freq: Once | SUBCUTANEOUS | Status: DC

## 2014-12-21 MED ORDER — TRAZODONE HCL 100 MG PO TABS: 100.0000 mg | ORAL_TABLET | Freq: Every day | ORAL | Status: DC

## 2014-12-21 MED ORDER — CARBAMAZEPINE 200 MG PO TABS: 300.0000 mg | ORAL_TABLET | Freq: Three times a day (TID) | ORAL | Status: DC

## 2014-12-21 MED ORDER — GABAPENTIN 300 MG PO CAPS: 600.0000 mg | ORAL_CAPSULE | Freq: Three times a day (TID) | ORAL | Status: DC

## 2014-12-21 MED ORDER — LAMOTRIGINE 200 MG PO TABS: 400.0000 mg | ORAL_TABLET | Freq: Every evening | ORAL | Status: DC

## 2014-12-21 MED ORDER — CLONAZEPAM 2 MG PO TABS: ORAL_TABLET | ORAL | Status: DC

## 2014-12-21 MED ORDER — SERTRALINE HCL 100 MG PO TABS: 200.0000 mg | ORAL_TABLET | Freq: Every day | ORAL | Status: DC

## 2014-12-21 NOTE — Progress Notes (Signed)
Patient ID: Alicia Lucero MRN: [id number], DOB: [birth year], 54 y.o. Date of Encounter: 12/21/2014, 5:26 PM  Primary Physician: [name], MD  Chief Complaint: Physical (CPE)  HPI: 54 y.o. y/o female with history of noted below here for CPE.  Doing well. No issues/complaints.  LMP: current  MMG:  due  Review of Systems: Consitutional: No fever, chills, fatigue, night sweats, lymphadenopathy, or weight changes. Eyes: No visual changes, eye redness, or discharge. ENT/Mouth: Ears: No otalgia, tinnitus, hearing loss, discharge. Nose: No congestion, rhinorrhea, sinus pain, or epistaxis. Throat: No sore throat, post nasal drip, or teeth pain. Cardiovascular: No CP, palpitations, diaphoresis, DOE, edema, orthopnea, PND. Respiratory: No cough, hemoptysis, SOB, or wheezing. Gastrointestinal: No anorexia, dysphagia, reflux, pain, nausea, vomiting, hematemesis, diarrhea, constipation, BRBPR, or melena. Breast: No discharge, pain, swelling, or mass. Genitourinary: No dysuria, frequency, urgency, hematuria, incontinence, nocturia, amenorrhea, vaginal discharge, pruritis, burning, abnormal bleeding, or pain. Musculoskeletal: No decreased ROM, myalgias, stiffness, joint swelling, or weakness. Skin: No rash, erythema, lesion changes, pain, warmth, jaundice, or pruritis. Neurological: No headache, dizziness, syncope, seizures, tremors, memory loss, coordination problems, or paresthesias. Psychological: No anxiety, depression, hallucinations, SI/HI. Endocrine: No fatigue, polydipsia, polyphagia, polyuria, or known diabetes. All other systems were reviewed and are otherwise negative.  Patient Active Problem List   Diagnosis Date Noted  . Type 1 diabetes mellitus 01/29/2012    Priority: High  . Morbid obesity with BMI of 70 and over, adult 01/29/2012    Priority: High  . Depression 01/29/2012    Priority: High  . Asthma, chronic 09/21/2013    Priority: Medium  . Diabetic neuropathy  05/16/2013    Priority: Medium  . Panic disorder 12/11/2012    Priority: Medium  . Chest pain 12/10/2012     Past Medical History  Diagnosis Date  . Depression   . GERD (gastroesophageal reflux disease)   . Anxiety   . 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 Bipolar 1 disorder   . PCOS (polycystic ovarian syndrome)      Past Surgical History  Procedure Laterality Date  . Cholecystectomy  1996  . Tonsillectomy      as a child  . Knee arthroscopy Left 2008  . Dilation and curettage of uterus      secondary to menorrhagia  . Wisdom tooth extraction      Home Meds:  Prior to Admission medications   Medication Sig Start Date End Date Taking? Authorizing Provider  ACCU-CHEK AVIVA PLUS test strip USE AS DIRECTED 05/07/14  Yes [name], MD  ACCU-CHEK SOFTCLIX LANCETS lancets USE AS DIRECTED   Yes Heather M Marte, PA-C  albuterol (PROVENTIL HFA;VENTOLIN HFA) 108 (90 BASE) MCG/ACT inhaler Inhale 2 puffs into the lungs every 6 (six) hours as needed for wheezing or shortness of breath. 10/08/14  Yes [name], MD  aspirin EC 81 MG EC tablet Take 1 tablet (81 mg total) by mouth daily. 12/11/12  Yes Penny Pia, MD  Aspirin-Acetaminophen-Caffeine (EXCEDRIN PO) Take by mouth as needed.   Yes Historical Provider, MD  B Complex-C (B-COMPLEX WITH VITAMIN C) tablet Take 1 tablet by mouth daily.   Yes Historical Provider, MD  CarBAMazepine (TEGRETOL PO) Take 300 mg by mouth 3 (three) times daily.    Yes Historical Provider, MD  clonazePAM (KLONOPIN) 2 MG tablet TAKE 1 TABLET BY MOUTH 3 TIMES DAILY AS NEEDED 07/20/14  Yes [name], MD  fluconazole (DIFLUCAN) 150 MG tablet Take 1 tablet (150 mg  total) by mouth once. Repeat if needed 08/24/14  Yes [name], MD  gabapentin (NEURONTIN) 300 MG capsule Take 600 mg by mouth 3 (three) times daily as needed (for anxiety).   Yes Historical Provider, MD  Insulin Pen Needle 31G X 8 MM MISC 1 each by  Does not apply route daily. 10/09/14  Yes Chelle S Jeffery, PA-C  lamoTRIgine (LAMICTAL) 200 MG tablet Take 2 tablets (400 mg total) by mouth every evening. 11/17/13  Yes [name], MD  LEVEMIR FLEXTOUCH 100 UNIT/ML Pen INJECT 36 UNITS ONCE DAILY AS DIRECTED Patient taking differently: INJECT 30 UNITS ONCE DAILY AS DIRECTED   Yes [name], MD  Liraglutide (VICTOZA) 18 MG/3ML SOPN Inject into the skin.   Yes Historical Provider, MD  metFORMIN (GLUCOPHAGE) 500 MG tablet Take 500 mg by mouth 2 (two) times daily with a meal. 2 tablets bid   Yes Historical Provider, MD  Multiple Vitamins-Minerals (WOMENS MULTIVITAMIN PLUS PO) Take 1 tablet by mouth daily.    Yes Historical Provider, MD  mupirocin ointment (BACTROBAN) 2 % APPLY TO AFFECTED AREAS TWICE DAILY 09/22/13  Yes [name], MD  naproxen sodium (ANAPROX) 220 MG tablet Take 220 mg by mouth 2 (two) times daily as needed (for pain).   Yes Historical Provider, MD  Omega-3 Fatty Acids (FISH OIL PO) Take 2 capsules by mouth 2 (two) times daily.   Yes Historical Provider, MD  ranitidine (ZANTAC) 150 MG capsule Take 150 mg by mouth 2 (two) times daily.   Yes Historical Provider, MD  sertraline (ZOLOFT) 100 MG tablet take 2 tablets by mouth once daily 06/12/14  Yes [name], MD  traZODone (DESYREL) 100 MG tablet Take 1 tablet (100 mg total) by mouth at bedtime. 02/17/13  Yes [name], MD    Allergies:  Allergies  Allergen Reactions  . Empagliflozin Other (See Comments)    Causes yeast infection  . Empagliflozin-Linagliptin Other (See Comments)    Causes yeast infection  . Other Nausea Only    States she can't tolerate seafood smell    History   Social History  . Marital Status: Married    Spouse Name: [name]  . Number of Children: 0  . Years of Education: Assoc.   Occupational History  .  [employer]   Social History Main Topics  . Smoking status: Former Smoker    Types: Cigarettes    Quit date:  06/30/2005  . Smokeless tobacco: Never Used     Comment: electronic cigarettes PRN  . Alcohol Use: No     Comment: quit: 2007  . Drug Use: No     Comment: occasional  . Sexual Activity: Yes   Other Topics Concern  . Not on file   Social History Narrative   Patient lives at home with her spouse.   Caffeine Use: 5-7 caffeine drinks daily    Family History  Problem Relation Age of Onset  . Cancer Mother     lung  . Hypertension Mother   . Cancer Father     liver  . Hypertension Father   . Heart disease Father     CVD  . Multiple sclerosis Sister   . Hypertension Sister     Physical Exam: Blood pressure 119/77, pulse 73, temperature 98.2 F (36.8 C), resp. rate 16, height 5\' 4"  (1.626 m), weight 311 lb (141.069 kg), last menstrual period 12/21/2014, SpO2 95 %., Body mass index is 53.36 kg/(m^2). Wt Readings from Last 3 Encounters:  12/21/14 311  lb (141.069 kg)  10/08/14 309 lb (140.161 kg)  08/24/14 309 lb 12.8 oz (140.524 kg)   BP Readings from Last 3 Encounters:  12/21/14 119/77  10/08/14 112/80  08/24/14 110/82   General: Well developed, well nourished, in no acute distress. BP 119/77 mmHg  Pulse 73  Temp(Src) 98.2 F (36.8 C)  Resp 16  Ht 5\' 4"  (1.626 m)  Wt 311 lb (141.069 kg)  BMI 53.36 kg/m2  SpO2 95%  LMP 12/21/2014   Wt Readings from Last 3 Encounters:  12/21/14 311 lb (141.069 kg)  10/08/14 309 lb (140.161 kg)  08/24/14 309 lb 12.8 oz (140.524 kg)   HEENT: Normocephalic, atraumatic. Conjunctiva pink, sclera non-icteric. Pupils 2 mm constricting to 1 mm, round, regular, and equally reactive to light and accomodation. EOMI. Fundi benign   Internal auditory canal clear. TMs with good cone of light and without pathology. Nasal mucosa pink. Nares are without discharge. No sinus tenderness. Oral mucosa pink. Dentition. Pharynx without exudate.    Neck: Supple. Trachea midline. No thyromegaly. Full ROM. No lymphadenopathy. Lungs: Clear to auscultation  bilaterally without wheezes, rales, or rhonchi. Breathing is of normal effort and unlabored. Cardiovascular: RRR with S1 S2. No murmurs, rubs, or gallops appreciated. Distal pulses 2+ symmetrically. No carotid or abdominal bruits. Breast: Symmetrical. No masses. Nipples without discharge. Abdomen: Soft, non-tender, non-distended with normoactive bowel sounds. No hepatosplenomegaly or masses. No rebound/guarding. No CVA tenderness. Without hernias.  Musculoskeletal: Full range of motion and 5/5 strength throughout. Without swelling, atrophy, tenderness, crepitus, or warmth. Extremities without clubbing, cyanosis, or edema. Calves supple. Skin: Warm and moist without erythema, ecchymosis, wounds, or rash. Neuro: A+Ox3. CN II-XII grossly intact. Moves all extremities spontaneously. Full sensation throughout. Normal gait. DTR 2+ throughout upper and lower extremities. Finger to nose intact. Psych:  Responds to questions appropriately with a normal affect.   Lab Results  Component Value Date   CHOL 190 07/20/2014   HDL 57 07/20/2014   LDLCALC 98 07/20/2014   TRIG 176* 07/20/2014   CHOLHDL 3.3 07/20/2014    Assessment/Plan:  54 y.o. y/o female here for CPE    ICD-9-CM ICD-10-CM   1. Annual physical exam V70.0 Z00.00 POCT glucose (manual entry)     POCT glycosylated hemoglobin (Hb A1C)     CBC with Differential/Platelet     Comprehensive metabolic panel     Lipid panel     TSH     POCT urinalysis dipstick     POCT UA - Microscopic Only     Vit D  25 hydroxy (rtn osteoporosis monitoring)  2. Type 2 diabetes mellitus, controlled 250.00 E11.9 POCT glucose (manual entry)     POCT glycosylated hemoglobin (Hb A1C)     Comprehensive metabolic panel     Lipid panel     TSH  3. IBS (irritable bowel syndrome) 564.1 K58.9 Ambulatory referral to Gastroenterology  4. Irregular menses 626.4 N92.6 Ambulatory referral to Gynecology  5. Adjustment disorder with mixed anxiety and depressed mood 309.28  F43.23 clonazePAM (KLONOPIN) 2 MG tablet  6. Bipolar affective disorder, currently depressed, mild 296.51 F31.31 traZODone (DESYREL) 100 MG tablet     sertraline (ZOLOFT) 100 MG tablet     lamoTRIgine (LAMICTAL) 200 MG tablet     gabapentin (NEURONTIN) 300 MG capsule     carbamazepine (TEGRETOL) 200 MG tablet  7. Need for shingles vaccine V04.89 Z23 zoster vaccine live, PF, (ZOSTAVAX) 40981 UNT/0.65ML injection    Signed, [name], MD 12/21/2014 5:26 PM

## 2014-12-21 NOTE — Patient Instructions (Signed)

## 2014-12-21 NOTE — Addendum Note (Signed)
Addended by: Thelma BargeICHARDSON, SHEKETIA D on: 12/21/2014 05:39 PM   Modules accepted: Orders

## 2014-12-22 LAB — CBC WITH DIFFERENTIAL/PLATELET
Basophils Absolute: 0 10*3/uL (ref 0.0–0.1)
Basophils Relative: 0 % (ref 0–1)
Eosinophils Absolute: 0.6 10*3/uL (ref 0.0–0.7)
Eosinophils Relative: 5 % (ref 0–5)
HCT: 39 % (ref 36.0–46.0)
Hemoglobin: 12.9 g/dL (ref 12.0–15.0)
Lymphocytes Relative: 24 % (ref 12–46)
Lymphs Abs: 3.1 10*3/uL (ref 0.7–4.0)
MCH: 28.9 pg (ref 26.0–34.0)
MCHC: 33.1 g/dL (ref 30.0–36.0)
MCV: 87.2 fL (ref 78.0–100.0)
MPV: 10.1 fL (ref 8.6–12.4)
Monocytes Absolute: 0.9 10*3/uL (ref 0.1–1.0)
Monocytes Relative: 7 % (ref 3–12)
Neutro Abs: 8.3 10*3/uL — ABNORMAL HIGH (ref 1.7–7.7)
Neutrophils Relative %: 64 % (ref 43–77)
Platelets: 376 10*3/uL (ref 150–400)
RBC: 4.47 MIL/uL (ref 3.87–5.11)
RDW: 14.1 % (ref 11.5–15.5)
WBC: 12.9 10*3/uL — ABNORMAL HIGH (ref 4.0–10.5)

## 2014-12-22 LAB — COMPREHENSIVE METABOLIC PANEL
ALT: 11 U/L (ref 0–35)
AST: 12 U/L (ref 0–37)
Albumin: 3.9 g/dL (ref 3.5–5.2)
Alkaline Phosphatase: 100 U/L (ref 39–117)
BUN: 17 mg/dL (ref 6–23)
CO2: 24 mEq/L (ref 19–32)
Calcium: 8.8 mg/dL (ref 8.4–10.5)
Chloride: 106 mEq/L (ref 96–112)
Creat: 0.64 mg/dL (ref 0.50–1.10)
Glucose, Bld: 167 mg/dL — ABNORMAL HIGH (ref 70–99)
Potassium: 4 mEq/L (ref 3.5–5.3)
Sodium: 139 mEq/L (ref 135–145)
Total Bilirubin: 0.2 mg/dL (ref 0.2–1.2)
Total Protein: 6.6 g/dL (ref 6.0–8.3)

## 2014-12-22 LAB — LIPID PANEL
Cholesterol: 193 mg/dL (ref 0–200)
HDL: 52 mg/dL (ref >=46)
LDL Cholesterol: 109 mg/dL — ABNORMAL HIGH (ref 0–99)
Total CHOL/HDL Ratio: 3.7 Ratio
Triglycerides: 162 mg/dL — ABNORMAL HIGH (ref <150)
VLDL: 32 mg/dL (ref 0–40)

## 2014-12-22 LAB — VITAMIN D 25 HYDROXY (VIT D DEFICIENCY, FRACTURES): Vit D, 25-Hydroxy: 24 ng/mL — ABNORMAL LOW (ref 30–100)

## 2014-12-22 LAB — TSH: TSH: 1.924 u[IU]/mL (ref 0.350–4.500)

## 2014-12-25 NOTE — Addendum Note (Signed)
Addended by: Thelma BargeICHARDSON, Starsha Morning D on: 12/25/2014 11:39 AM   Modules accepted: Orders

## 2014-12-26 ENCOUNTER — Encounter: Payer: Self-pay | Admitting: Obstetrics & Gynecology

## 2014-12-26 DIAGNOSIS — Z6841 Body Mass Index (BMI) 40.0 and over, adult: Secondary | ICD-10-CM

## 2015-01-23 ENCOUNTER — Encounter: Payer: Self-pay | Admitting: *Deleted

## 2015-01-23 DIAGNOSIS — Z6841 Body Mass Index (BMI) 40.0 and over, adult: Principal | ICD-10-CM

## 2015-02-16 ENCOUNTER — Other Ambulatory Visit: Payer: Self-pay | Admitting: Family Medicine

## 2015-02-28 ENCOUNTER — Other Ambulatory Visit: Payer: Self-pay | Admitting: Family Medicine

## 2015-03-01 ENCOUNTER — Ambulatory Visit (INDEPENDENT_AMBULATORY_CARE_PROVIDER_SITE_OTHER): Payer: 59 | Admitting: Physician Assistant

## 2015-03-01 VITALS — BP 132/80 | HR 115 | Temp 98.3°F | Resp 18 | Ht 64.0 in | Wt 321.0 lb

## 2015-03-01 DIAGNOSIS — R350 Frequency of micturition: Secondary | ICD-10-CM | POA: Diagnosis not present

## 2015-03-01 DIAGNOSIS — F319 Bipolar disorder, unspecified: Secondary | ICD-10-CM | POA: Insufficient documentation

## 2015-03-01 DIAGNOSIS — R309 Painful micturition, unspecified: Secondary | ICD-10-CM | POA: Diagnosis not present

## 2015-03-01 DIAGNOSIS — R300 Dysuria: Secondary | ICD-10-CM | POA: Diagnosis not present

## 2015-03-01 DIAGNOSIS — E119 Type 2 diabetes mellitus without complications: Secondary | ICD-10-CM | POA: Diagnosis not present

## 2015-03-01 DIAGNOSIS — R809 Proteinuria, unspecified: Secondary | ICD-10-CM | POA: Diagnosis not present

## 2015-03-01 LAB — POCT URINALYSIS DIPSTICK
Bilirubin, UA: NEGATIVE
Glucose, UA: NEGATIVE
Ketones, UA: NEGATIVE
Nitrite, UA: POSITIVE
Spec Grav, UA: 1.02
Urobilinogen, UA: 0.2
pH, UA: 5.5

## 2015-03-01 LAB — POCT UA - MICROSCOPIC ONLY
Casts, Ur, LPF, POC: NEGATIVE
Crystals, Ur, HPF, POC: NEGATIVE
Mucus, UA: NEGATIVE
Yeast, UA: NEGATIVE

## 2015-03-01 LAB — MICROALBUMIN, URINE: Microalb, Ur: 55.1 mg/dL — ABNORMAL HIGH (ref <2.0)

## 2015-03-01 MED ORDER — LISINOPRIL 5 MG PO TABS: 2.5000 mg | ORAL_TABLET | Freq: Every day | ORAL | Status: DC

## 2015-03-01 MED ORDER — LISINOPRIL 5 MG PO TABS: 5.0000 mg | ORAL_TABLET | Freq: Every day | ORAL | Status: DC

## 2015-03-01 MED ORDER — NITROFURANTOIN MONOHYD MACRO 100 MG PO CAPS: 100.0000 mg | ORAL_CAPSULE | Freq: Two times a day (BID) | ORAL | Status: DC

## 2015-03-01 NOTE — Progress Notes (Signed)
Subjective:    Patient ID: Alicia Lucero, female    DOB: 11-24-1960, 54 y.o.   MRN: [id number]  Chief Complaint  Patient presents with  . burning with urination    since sunday using azo, cranberry  . Urinary Frequency  . Dysuria  . Back Pain    lower back left side since yesterday    Patient Active Problem List   Diagnosis Date Noted  . Diabetes mellitus, type 2 03/01/2015  . Bilateral polycystic ovarian syndrome 03/01/2015  . Acid reflux 03/01/2015  . Diabetic peripheral neuropathy 03/01/2015  . Bipolar 1 disorder 03/01/2015  . Morbid obesity with BMI of 50.0-59.9, adult 12/26/2014  . Menorrhagia 12/26/2014  . Asthma, chronic 09/21/2013  . Diabetic neuropathy 05/16/2013  . Panic disorder 12/11/2012  . Chest pain 12/10/2012  . Depression 01/29/2012   Prior to Admission medications   Medication Sig Start Date End Date Taking? Authorizing Provider  ACCU-CHEK AVIVA PLUS test strip USE AS DIRECTED 05/07/14  Yes [name], MD  ACCU-CHEK SOFTCLIX LANCETS lancets USE AS DIRECTED   Yes Nelva Nay, PA-C  aspirin EC 81 MG EC tablet Take 1 tablet (81 mg total) by mouth daily. 12/11/12  Yes Penny Pia, MD  Aspirin-Acetaminophen-Caffeine (EXCEDRIN PO) Take by mouth as needed.   Yes Historical Provider, MD  carbamazepine (TEGRETOL) 200 MG tablet Take 1.5 tablets (300 mg total) by mouth 3 (three) times daily. 12/21/14  Yes [name], MD  clonazePAM (KLONOPIN) 2 MG tablet TAKE 1 TABLET BY MOUTH 3 TIMES DAILY AS NEEDED 12/21/14  Yes [name], MD  fluconazole (DIFLUCAN) 150 MG tablet Take 1 tablet (150 mg total) by mouth once. Repeat if needed 08/24/14  Yes [name], MD  gabapentin (NEURONTIN) 300 MG capsule Take 2 capsules (600 mg total) by mouth 3 (three) times daily. 12/21/14  Yes [name], MD  Insulin Pen Needle 31G X 8 MM MISC 1 each by Does not apply route daily. 10/09/14  Yes Chelle Jeffery, PA-C  lamoTRIgine (LAMICTAL) 200 MG tablet Take 2 tablets (400  mg total) by mouth every evening. 12/21/14  Yes [name], MD  LEVEMIR FLEXTOUCH 100 UNIT/ML Pen inject 36 units once daily as directed 02/19/15  Yes [name], MD  Liraglutide (VICTOZA) 18 MG/3ML SOPN Inject into the skin.   Yes Historical Provider, MD  metFORMIN (GLUCOPHAGE) 500 MG tablet Take 500 mg by mouth 2 (two) times daily with a meal. 2 tablets bid   Yes Historical Provider, MD  Multiple Vitamins-Minerals (WOMENS MULTIVITAMIN PLUS PO) Take 1 tablet by mouth daily.    Yes Historical Provider, MD  mupirocin ointment (BACTROBAN) 2 % APPLY TO AFFECTED AREAS TWICE DAILY 09/22/13  Yes [name], MD  naproxen sodium (ANAPROX) 220 MG tablet Take 220 mg by mouth 2 (two) times daily as needed (for pain).   Yes Historical Provider, MD  Omega-3 Fatty Acids (FISH OIL PO) Take 2 capsules by mouth 2 (two) times daily.   Yes Historical Provider, MD  ranitidine (ZANTAC) 150 MG capsule Take 150 mg by mouth 2 (two) times daily.   Yes Historical Provider, MD  sertraline (ZOLOFT) 100 MG tablet Take 2 tablets (200 mg total) by mouth daily. 12/21/14  Yes [name], MD  albuterol (PROVENTIL HFA;VENTOLIN HFA) 108 (90 BASE) MCG/ACT inhaler Inhale 2 puffs into the lungs every 6 (six) hours as needed for wheezing or shortness of breath. Patient not taking: Reported on 03/01/2015 10/08/14   [name], MD  B Complex-C (B-COMPLEX  WITH VITAMIN C) tablet Take 1 tablet by mouth daily.    Historical Provider, MD  lisinopril (PRINIVIL,ZESTRIL) 5 MG tablet Take 0.5 tablets (2.5 mg total) by mouth daily. 03/01/15   Raelyn Ensign, PA  nitrofurantoin, macrocrystal-monohydrate, (MACROBID) 100 MG capsule Take 1 capsule (100 mg total) by mouth 2 (two) times daily. 03/01/15   Raelyn Ensign, PA  traZODone (DESYREL) 100 MG tablet Take 1 tablet (100 mg total) by mouth at bedtime. Patient not taking: Reported on 03/01/2015 12/21/14   [name], MD  zoster vaccine live, PF, (ZOSTAVAX) 91478 UNT/0.65ML injection  Inject 19,400 Units into the skin once. Patient not taking: Reported on 03/01/2015 12/21/14   [name], MD   Medications, allergies, past medical history, surgical history, family history, social history and problem list reviewed and updated.  HPI  54 yof presents with uti sx past 4 days.  Sx started with dysuria and increased freq. She notes low back pain past few days but states this is just along spine and thinks it is from sitting in chair so much past week or so and not doing much. Denies abd pain, fevers, chills, n/v, diarrhea.   Has hx dm. Last a1c on record 6.6 She is taking all her dm meds as prescribed but has not been checking bg regularly at home.   Review of Systems See HPI.     Objective:   Physical Exam  Constitutional: She is oriented to person, place, and time. She appears well-developed and well-nourished.  Non-toxic appearance. She does not have a sickly appearance. She does not appear ill. No distress.  BP 132/80 mmHg  Pulse 115  Temp(Src) 98.3 F (36.8 C) (Oral)  Resp 18  Ht  (1.626 m)  Wt 321 lb (145.605 kg)  BMI 55.07 kg/m2  SpO2 97%  LMP 02/25/2015   Abdominal: Soft. Normal appearance and bowel sounds are normal. There is no tenderness. There is no CVA tenderness.  Musculoskeletal:       Lumbar back: She exhibits tenderness.  Mild lumbar paraspinal ttp.   Neurological: She is alert and oriented to person, place, and time.  Skin: Skin is warm and dry. No rash noted. She is not diaphoretic. No pallor.  Psychiatric: She has a normal mood and affect. Her speech is normal and behavior is normal.   Results for orders placed or performed in visit on 03/01/15  Microalbumin, urine  Result Value Ref Range   Microalb, Ur 55.1 (H) <2.0 mg/dL  POCT urinalysis dipstick  Result Value Ref Range   Color, UA orange    Clarity, UA cloudy    Glucose, UA neg    Bilirubin, UA neg    Ketones, UA neg    Spec Grav, UA 1.020    Blood, UA large    pH, UA 5.5     Protein, UA >=300    Urobilinogen, UA 0.2    Nitrite, UA positive    Leukocytes, UA large (3+) (A) Negative  POCT UA - Microscopic Only  Result Value Ref Range   WBC, Ur, HPF, POC tntc    RBC, urine, microscopic 0-4    Bacteria, U Microscopic many    Mucus, UA neg    Epithelial cells, urine per micros few    Crystals, Ur, HPF, POC neg    Casts, Ur, LPF, POC neg    Yeast, UA neg        Assessment & Plan:   Pain with urination - Plan: POCT urinalysis dipstick, POCT  UA - Microscopic Only, Urine culture, nitrofurantoin, macrocrystal-monohydrate, (MACROBID) 100 MG capsule Frequent urination - Plan: POCT urinalysis dipstick, POCT UA - Microscopic Only Burning with urination - Plan: POCT urinalysis dipstick, POCT UA - Microscopic Only --leuks and nitrite on ua, macrobid 5 days bid --urine cx sent  Proteinuria - Plan: Microalbumin, urine, lisinopril (PRINIVIL,ZESTRIL) 5 MG tablet Diabetes mellitus type 2, controlled, without complications - Plan: Microalbumin, urine, lisinopril (PRINIVIL,ZESTRIL) 5 MG tablet -->300 protein noted on ua, looking through hx pt has had positive urine ma in the past --likely diabetic nephropathy --urine ma sent today --pt states she used to be on an ace-i but stopped this as she just never got it refilled --lisinopril 2.5 mg qd for nephro protection, bp normal today, cmp normal 2 months ago --pt instructed to let us know if any presyncope, dizzy, lh --instructed to rtc 1-2 months for diabetes check   Donnajean Lopes, PA-C Physician Assistant-Certified [facility]  03/01/2015 3:33 PM

## 2015-03-01 NOTE — Progress Notes (Deleted)
[facility] [address] [place] [address] [phone]  Date:  03/01/2015   Name:  Alicia Lucero   DOB:  1961/04/22   MRN:  [id number]  PCP:  [name], MD    Chief Complaint: burning with urination; Urinary Frequency; Dysuria; and Back Pain   History of Present Illness:  This is a 54 y.o. female with PMH type 1 DM, depression, asthma who is presenting dysuria and urinary frequency x 5 days. She started having left lower back pain 1 day ago.  Review of Systems:  Review of Systems See HPI  Patient Active Problem List   Diagnosis Date Noted  . Morbid obesity with BMI of 50.0-59.9, adult 12/26/2014  . Menorrhagia 12/26/2014  . Asthma, chronic 09/21/2013  . Diabetic neuropathy 05/16/2013  . Panic disorder 12/11/2012  . Chest pain 12/10/2012  . Type 1 diabetes mellitus 01/29/2012  . Depression 01/29/2012    Prior to Admission medications   Medication Sig Start Date End Date Taking? Authorizing Provider  ACCU-CHEK AVIVA PLUS test strip USE AS DIRECTED 05/07/14  Yes [name], MD  ACCU-CHEK SOFTCLIX LANCETS lancets USE AS DIRECTED   Yes Nelva Nay, PA-C  aspirin EC 81 MG EC tablet Take 1 tablet (81 mg total) by mouth daily. 12/11/12  Yes Penny Pia, MD  Aspirin-Acetaminophen-Caffeine (EXCEDRIN PO) Take by mouth as needed.   Yes Historical Provider, MD  carbamazepine (TEGRETOL) 200 MG tablet Take 1.5 tablets (300 mg total) by mouth 3 (three) times daily. 12/21/14  Yes [name], MD  clonazePAM (KLONOPIN) 2 MG tablet TAKE 1 TABLET BY MOUTH 3 TIMES DAILY AS NEEDED 12/21/14  Yes [name], MD  fluconazole (DIFLUCAN) 150 MG tablet Take 1 tablet (150 mg total) by mouth once. Repeat if needed 08/24/14  Yes [name], MD  gabapentin (NEURONTIN) 300 MG capsule Take 2 capsules (600 mg total) by mouth 3 (three) times daily. 12/21/14  Yes [name], MD  Insulin Pen Needle 31G X 8 MM MISC 1 each by Does not apply route daily.  10/09/14  Yes Chelle Jeffery, PA-C  lamoTRIgine (LAMICTAL) 200 MG tablet Take 2 tablets (400 mg total) by mouth every evening. 12/21/14  Yes [name], MD  LEVEMIR FLEXTOUCH 100 UNIT/ML Pen inject 36 units once daily as directed 02/19/15  Yes [name], MD  Liraglutide (VICTOZA) 18 MG/3ML SOPN Inject into the skin.   Yes Historical Provider, MD  metFORMIN (GLUCOPHAGE) 500 MG tablet Take 500 mg by mouth 2 (two) times daily with a meal. 2 tablets bid   Yes Historical Provider, MD  Multiple Vitamins-Minerals (WOMENS MULTIVITAMIN PLUS PO) Take 1 tablet by mouth daily.    Yes Historical Provider, MD  mupirocin ointment (BACTROBAN) 2 % APPLY TO AFFECTED AREAS TWICE DAILY 09/22/13  Yes [name], MD  naproxen sodium (ANAPROX) 220 MG tablet Take 220 mg by mouth 2 (two) times daily as needed (for pain).   Yes Historical Provider, MD  Omega-3 Fatty Acids (FISH OIL PO) Take 2 capsules by mouth 2 (two) times daily.   Yes Historical Provider, MD  ranitidine (ZANTAC) 150 MG capsule Take 150 mg by mouth 2 (two) times daily.   Yes Historical Provider, MD  sertraline (ZOLOFT) 100 MG tablet Take 2 tablets (200 mg total) by mouth daily. 12/21/14  Yes [name], MD  Allergies  Allergen Reactions  . Empagliflozin Other (See Comments)    Causes yeast infection  . Empagliflozin-Linagliptin Other (See Comments)    Causes yeast infection  . Other Nausea Only    States she can't tolerate seafood smell    Past Surgical History  Procedure Laterality Date  . Cholecystectomy  1996  . Tonsillectomy      as a child  . Knee arthroscopy Left 2008  . Dilation and curettage of uterus      secondary to menorrhagia  . Wisdom tooth extraction      History  Substance Use Topics  . Smoking status: Former Smoker    Types: Cigarettes    Quit date: 06/30/2005  . Smokeless tobacco: Never Used     Comment: electronic cigarettes PRN  . Alcohol Use: No     Comment: quit:  2007    Family History  Problem Relation Age of Onset  . Cancer Mother     lung  . Hypertension Mother   . Cancer Father     liver  . Hypertension Father   . Heart disease Father     CVD  . Multiple sclerosis Sister   . Hypertension Sister     Medication list has been reviewed and updated.  Physical Examination:  Physical Exam  Constitutional: She is oriented to person, place, and time. She appears well-developed and well-nourished. No distress.  HENT:  Head: Normocephalic and atraumatic.  Right Ear: Hearing normal.  Left Ear: Hearing normal.  Nose: Nose normal.  Eyes: Conjunctivae and lids are normal. Right eye exhibits no discharge. Left eye exhibits no discharge. No scleral icterus.  Pulmonary/Chest: Effort normal. No respiratory distress.  Musculoskeletal: Normal range of motion.  Neurological: She is alert and oriented to person, place, and time.  Skin: Skin is warm, dry and intact. No lesion and no rash noted.  Psychiatric: She has a normal mood and affect. Her speech is normal and behavior is normal. Thought content normal.    BP 132/80 mmHg  Pulse 115  Temp(Src) 98.3 F (36.8 C) (Oral)  Resp 18  Ht 5\' 4"  (1.626 m)  Wt 321 lb (145.605 kg)  BMI 55.07 kg/m2  SpO2 97%  LMP 02/25/2015  Assessment and Plan:

## 2015-03-01 NOTE — Patient Instructions (Addendum)
You have a UTI. Please take the macrobid twice daily for 5 days. I've sent a urine culture to ensure this will work. You have a protein in your urine that is likely from damage due to the diabetes.  Please start the very low dose 2.5 mg of lisinopril daily. This is to protect the kidneys. We are checking one more lab for this. Please let us know if you start feeling dizzy or lightheaded.

## 2015-03-03 LAB — URINE CULTURE: Colony Count: >=100000

## 2015-04-24 ENCOUNTER — Other Ambulatory Visit: Payer: Self-pay | Admitting: Family Medicine

## 2015-05-25 ENCOUNTER — Ambulatory Visit (INDEPENDENT_AMBULATORY_CARE_PROVIDER_SITE_OTHER): Payer: 59 | Admitting: Family Medicine

## 2015-05-25 VITALS — BP 100/72 | HR 100 | Temp 98.1°F | Resp 18 | Ht 64.0 in | Wt 330.6 lb

## 2015-05-25 DIAGNOSIS — S43421A Sprain of right rotator cuff capsule, initial encounter: Secondary | ICD-10-CM

## 2015-05-25 MED ORDER — METHYLPREDNISOLONE ACETATE 80 MG/ML IJ SUSP
40.0000 mg | Freq: Once | INTRAMUSCULAR | Status: AC
  Administered 2015-05-25: 40 mg via INTRA_ARTICULAR

## 2015-05-25 NOTE — Progress Notes (Signed)
Patient ID: Alicia Lucero, female   DOB: Mar 16, 1961, 54 y.o.   MRN: [id number]  This chart was scribed for Alicia Sidle, MD by Charline Bills, ED Scribe. The patient was seen in room 14. Patient's care was started at 9:10 AM.  Patient ID: Alicia Lucero MRN: [id number], DOB: 1961/09/06, 54 y.o. Date of Encounter: 05/25/2015, 9:08 AM  Primary Physician: Alicia Sidle, MD  Chief Complaint  Patient presents with  . Shoulder Pain    right shoulder, fell x 3 weeks ago  . Immunizations    flu vaccine    HPI: 54 y.o. year old female with history below presents with constant right shoulder pain for the past 3 weeks. Pt states that she fell while walking down uneven pavement and landed on her right forearm. She reports worsening sharp right shoulder pain with lifting, ABduction, external rotation and palpation. She also reports that initially pain radiated into her upper right arm but this has resolved. She further reports that pain worsens as the day progresses. She has tried Advil, Aleve and one of Maria's Norco tablets without significant relief. Pt is right hand dominant.   Endocrinology  Pt states that she has not been able to swim since her injury and has gained weight. Her weight was down to 310 and is now 330 lbs. She also reports that her A1C elevated to 7.7 so her endocrinologist increased her Levemir from 32 to 40 units/day. Her follow-up appointment is in 3 months.   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 The shoulder has kept her from getting her sister in the pool and kept her from being more active.  Past Medical History  Diagnosis Date  . Depression   . GERD (gastroesophageal reflux disease)   . Anxiety   . 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 Bipolar 1 disorder   . PCOS (polycystic ovarian syndrome)     Home Meds: Prior to Admission medications   Medication Sig Start Date End Date Taking?  Authorizing Provider  ACCU-CHEK AVIVA PLUS test strip USE AS DIRECTED 05/07/14  Yes Alicia Sidle, MD  ACCU-CHEK SOFTCLIX LANCETS lancets USE AS DIRECTED   Yes Nelva Nay, PA-C  aspirin EC 81 MG EC tablet Take 1 tablet (81 mg total) by mouth daily. 12/11/12  Yes Penny Pia, MD  Aspirin-Acetaminophen-Caffeine (EXCEDRIN PO) Take by mouth as needed.   Yes Historical Provider, MD  B Complex-C (B-COMPLEX WITH VITAMIN C) tablet Take 1 tablet by mouth daily.   Yes Historical Provider, MD  carbamazepine (TEGRETOL) 200 MG tablet Take 1.5 tablets (300 mg total) by mouth 3 (three) times daily. 12/21/14  Yes Alicia Sidle, MD  clonazePAM (KLONOPIN) 2 MG tablet TAKE 1 TABLET BY MOUTH 3 TIMES DAILY AS NEEDED 12/21/14  Yes Alicia Sidle, MD  fluconazole (DIFLUCAN) 150 MG tablet Take 1 tablet (150 mg total) by mouth once. Repeat if needed 08/24/14  Yes Alicia Sidle, MD  gabapentin (NEURONTIN) 300 MG capsule Take 2 capsules (600 mg total) by mouth 3 (three) times daily. 12/21/14  Yes Alicia Sidle, MD  Insulin Pen Needle 31G X 8 MM MISC 1 each by Does not apply route daily. 10/09/14  Yes Chelle Jeffery, PA-C  lamoTRIgine (LAMICTAL) 200 MG tablet Take 2 tablets (400 mg total) by mouth every evening. 12/21/14  Yes Alicia Sidle, MD  LEVEMIR FLEXTOUCH 100 UNIT/ML Pen INJECT 36 UNITS ONCE DAILY AS DIRECTED 04/26/15  Yes Alicia Sidle, MD  Liraglutide (VICTOZA) 18 MG/3ML SOPN Inject into the skin.   Yes Historical Provider, MD  lisinopril (PRINIVIL,ZESTRIL) 5 MG tablet Take 0.5 tablets (2.5 mg total) by mouth daily. 03/01/15  Yes Todd McVeigh, PA  metFORMIN (GLUCOPHAGE) 500 MG tablet Take 500 mg by mouth 2 (two) times daily with a meal. 2 tablets bid   Yes Historical Provider, MD  Multiple Vitamins-Minerals (WOMENS MULTIVITAMIN PLUS PO) Take 1 tablet by mouth daily.    Yes Historical Provider, MD  mupirocin ointment (BACTROBAN) 2 % APPLY TO AFFECTED AREAS TWICE DAILY 09/22/13  Yes Alicia Sidle, MD  naproxen  sodium (ANAPROX) 220 MG tablet Take 220 mg by mouth 2 (two) times daily as needed (for pain).   Yes Historical Provider, MD  Omega-3 Fatty Acids (FISH OIL PO) Take 2 capsules by mouth 2 (two) times daily.   Yes Historical Provider, MD  ranitidine (ZANTAC) 150 MG capsule Take 150 mg by mouth 2 (two) times daily.   Yes Historical Provider, MD  sertraline (ZOLOFT) 100 MG tablet Take 2 tablets (200 mg total) by mouth daily. 12/21/14  Yes Alicia Sidle, MD  traZODone (DESYREL) 100 MG tablet Take 1 tablet (100 mg total) by mouth at bedtime. 12/21/14  Yes Alicia Sidle, MD  albuterol (PROVENTIL HFA;VENTOLIN HFA) 108 (90 BASE) MCG/ACT inhaler Inhale 2 puffs into the lungs every 6 (six) hours as needed for wheezing or shortness of breath. Patient not taking: Reported on 03/01/2015 10/08/14   Alicia Sidle, MD  nitrofurantoin, macrocrystal-monohydrate, (MACROBID) 100 MG capsule Take 1 capsule (100 mg total) by mouth 2 (two) times daily. Patient not taking: Reported on 05/25/2015 03/01/15   Raelyn Ensign, PA  zoster vaccine live, PF, (ZOSTAVAX) 16109 UNT/0.65ML injection Inject 19,400 Units into the skin once. Patient not taking: Reported on 03/01/2015 12/21/14   Alicia Sidle, MD    Allergies:  Allergies  Allergen Reactions  . Empagliflozin Other (See Comments)    Causes yeast infection  . Empagliflozin-Linagliptin Other (See Comments)    Causes yeast infection  . Other Nausea Only    States she can't tolerate seafood smell    Social History   Social History  . Marital Status: Married    Spouse Name: Alicia Lucero  . Number of Children: 0  . Years of Education: Assoc.   Occupational History  .  [employer]   Social History Main Topics  . Smoking status: Former Smoker    Types: Cigarettes    Quit date: 06/30/2005  . Smokeless tobacco: Never Used     Comment: electronic cigarettes PRN  . Alcohol Use: No     Comment: quit: 2007  . Drug Use: No     Comment: occasional  . Sexual Activity:  Yes   Other Topics Concern  . Not on file   Social History Narrative   Patient lives at home with her spouse.   Caffeine Use: 5-7 caffeine drinks daily     Review of Systems: Constitutional: negative for chills, fever, night sweats, weight changes, or fatigue  HEENT: negative for vision changes, hearing loss, congestion, rhinorrhea, ST, epistaxis, or sinus pressure Cardiovascular: negative for chest pain or palpitations Respiratory: negative for hemoptysis, wheezing, shortness of breath, or cough Abdominal: negative for abdominal pain, nausea, vomiting, diarrhea, or constipation Msk: + arthralgias  Dermatological: negative for rash Neurologic: negative for headache, dizziness, or syncope All other systems reviewed and are otherwise negative with the exception to those above and in the HPI.  Physical Exam:  Blood pressure 100/72, pulse 100, temperature 98.1 F (36.7 C), temperature source Oral, resp. rate 18, height  (1.626 m), weight 330 lb 9.6 oz (149.959 kg), last menstrual period 05/02/2015, SpO2 98 %., Body mass index is 56.72 kg/(m^2). General: Well developed, well nourished, in no acute distress. Head: Normocephalic, atraumatic, eyes without discharge, sclera non-icteric, nares are without discharge. Bilateral auditory canals clear, TM's are without perforation, pearly grey and translucent with reflective cone of light bilaterally. Oral cavity moist, posterior pharynx without exudate, erythema, peritonsillar abscess, or post nasal drip.  Neck: Supple. No thyromegaly. Full ROM. No lymphadenopathy. Lungs: Clear bilaterally to auscultation without wheezes, rales, or rhonchi. Breathing is unlabored. Heart: RRR with S1 S2. No murmurs, rubs, or gallops appreciated. Abdomen: Soft, non-tender, non-distended with normoactive bowel sounds. No hepatomegaly. No rebound/guarding. No obvious abdominal masses. Msk: Strength and tone normal for age. R shoulder: Tender anterior joint line.  Pain with ABduction and external rotation.  Extremities/Skin: Warm and dry. No clubbing or cyanosis. No edema. No rashes or suspicious lesions. Neuro: Alert and oriented X 3. Moves all extremities spontaneously. Gait is normal. CNII-XII grossly in tact. Psych:  Responds to questions appropriately with a tearful affect.    Labs:  ASSESSMENT AND PLAN:  54 y.o. year old female with  1. Rotator cuff (capsule) sprain, right, initial encounter    This chart was scribed in my presence and reviewed by me personally.    ICD-9-CM ICD-10-CM   1. Rotator cuff (capsule) sprain, right, initial encounter 840.4 S43.421A methylPREDNISolone acetate (DEPO-MEDROL) injection 40 mg   We also spent time discussing her diabetes, lack of exercise, and depression. Face-to-face time was 40 minutes.   Signed, Alicia Sidle, MD 05/25/2015 9:08 AM

## 2015-06-06 ENCOUNTER — Other Ambulatory Visit: Payer: Self-pay | Admitting: Family Medicine

## 2015-06-07 ENCOUNTER — Other Ambulatory Visit: Payer: Self-pay | Admitting: Family Medicine

## 2015-06-08 NOTE — Telephone Encounter (Signed)
Dose updated according to 05/25/15 OV notes.

## 2015-06-27 ENCOUNTER — Other Ambulatory Visit: Payer: Self-pay | Admitting: Family Medicine

## 2015-06-27 DIAGNOSIS — K5732 Diverticulitis of large intestine without perforation or abscess without bleeding: Secondary | ICD-10-CM

## 2015-07-04 ENCOUNTER — Ambulatory Visit (INDEPENDENT_AMBULATORY_CARE_PROVIDER_SITE_OTHER): Payer: 59 | Admitting: Family Medicine

## 2015-07-04 VITALS — BP 120/76 | HR 98 | Temp 97.7°F | Resp 16 | Ht 64.0 in | Wt 325.0 lb

## 2015-07-04 DIAGNOSIS — K911 Postgastric surgery syndromes: Secondary | ICD-10-CM

## 2015-07-04 DIAGNOSIS — J209 Acute bronchitis, unspecified: Secondary | ICD-10-CM

## 2015-07-04 MED ORDER — ALBUTEROL SULFATE HFA 108 (90 BASE) MCG/ACT IN AERS
2.0000 | INHALATION_SPRAY | RESPIRATORY_TRACT | Status: DC | PRN
Start: 2015-07-04 — End: 2015-07-23

## 2015-07-04 MED ORDER — HYDROCODONE-HOMATROPINE 5-1.5 MG/5ML PO SYRP: 5.0000 mL | ORAL_SOLUTION | Freq: Three times a day (TID) | ORAL | Status: DC | PRN

## 2015-07-04 MED ORDER — AZITHROMYCIN 250 MG PO TABS: ORAL_TABLET | ORAL | Status: DC

## 2015-07-04 NOTE — Progress Notes (Signed)
Patient ID: Alicia Lucero, female   DOB: 22-Nov-1960, 54 y.o.   MRN: [id number]  By signing my name below, I, Essence Howell, attest that this documentation has been prepared under the direction and in the presence of [name], MD Electronically Signed: Charline Bills, ED Scribe 07/04/2015 at 9:14 AM.  Patient ID: Alicia Lucero MRN: [id number], DOB: [birth year], 54 y.o. Date of Encounter: 07/04/2015, 8:54 AM  Primary Physician: [name], MD  Chief Complaint  Patient presents with   Cough    x 5 days   right ear    "stuck Q-tip in ear"   Referral to GI doc    get diarrhea after eating   HPI: 54 y.o. year old female with history below presents with persistent dry cough for the past 5 days. Pt attributes cough to a recent drop in temperature and leaves falling from her oak tree. Pt reports associated wheezing and difficulty sleeping due to cough for the past few days as well. She has used an inhaler in the past for the same. No treatments tried PTA.   Right Ear Pt states that she cleaned out her right ear with a Q-tip yesterday and noticed bleeding which has resolved. She denies hearing loss and any other symptoms at this time.   GI Pt reports gradually worsening, intermittent diarrhea after eating for the past few months, daily for the past month. She reports x6-9 episodes of diarrhea after eating. She has tried pepto bismol and imodium without significant relief. She denies blood in stools. Pt's past surgical history includes cholecystectomy in 1996. Pt requests a referral to a GI specialist at this visit.  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 sister, Byrd Hesselbach, who has MS.   Past Medical History  Diagnosis Date   Depression    GERD (gastroesophageal reflux disease)    Anxiety    Diabetes mellitus without complication (HCC)     Type 2 insulin dependent   Bipolar 1 disorder (HCC)    PCOS (polycystic ovarian syndrome)     Home Meds: Prior to Admission  medications   Medication Sig Start Date End Date Taking? Authorizing Provider  ACCU-CHEK AVIVA PLUS test strip USE AS DIRECTED 05/07/14  Yes [name], MD  ACCU-CHEK SOFTCLIX LANCETS lancets USE AS DIRECTED   Yes Heather M Marte, PA-C  Aspirin-Acetaminophen-Caffeine (EXCEDRIN PO) Take by mouth as needed.   Yes Historical Provider, MD  B Complex-C (B-COMPLEX WITH VITAMIN C) tablet Take 1 tablet by mouth daily.   Yes Historical Provider, MD  carbamazepine (TEGRETOL) 200 MG tablet Take 1.5 tablets (300 mg total) by mouth 3 (three) times daily. 12/21/14  Yes [name], MD  clonazePAM (KLONOPIN) 2 MG tablet TAKE 1 TABLET BY MOUTH 3 TIMES DAILY AS NEEDED 12/21/14  Yes [name], MD  gabapentin (NEURONTIN) 300 MG capsule TAKE 2 CAPSULES BY MOUTH THREE TIMES A DAY 06/06/15  Yes Morrell Riddle, PA-C  Insulin Detemir (LEVEMIR FLEXTOUCH) 100 UNIT/ML Pen Inject 40 Units into the skin daily. 06/08/15  Yes [name], MD  Insulin Pen Needle 31G X 8 MM MISC 1 each by Does not apply route daily. 10/09/14  Yes Chelle Jeffery, PA-C  lamoTRIgine (LAMICTAL) 200 MG tablet Take 2 tablets (400 mg total) by mouth every evening. 12/21/14  Yes [name], MD  Liraglutide (VICTOZA) 18 MG/3ML SOPN Inject into the skin.   Yes Historical Provider, MD  lisinopril (PRINIVIL,ZESTRIL) 5 MG tablet Take 0.5 tablets (2.5 mg total) by mouth daily. 03/01/15  Yes Raelyn Ensign, PA  metFORMIN (GLUCOPHAGE) 500 MG tablet Take 500 mg by mouth 2 (two) times daily with a meal. 2 tablets bid   Yes Historical Provider, MD  Multiple Vitamins-Minerals (WOMENS MULTIVITAMIN PLUS PO) Take 1 tablet by mouth daily.    Yes Historical Provider, MD  mupirocin ointment (BACTROBAN) 2 % APPLY TO AFFECTED AREAS TWICE DAILY 09/22/13  Yes [name], MD  naproxen sodium (ANAPROX) 220 MG tablet Take 220 mg by mouth 2 (two) times daily as needed (for pain).   Yes Historical Provider, MD  Omega-3 Fatty Acids (FISH OIL PO) Take 2 capsules by  mouth 2 (two) times daily.   Yes Historical Provider, MD  ranitidine (ZANTAC) 150 MG capsule Take 150 mg by mouth 2 (two) times daily.   Yes Historical Provider, MD  sertraline (ZOLOFT) 100 MG tablet Take 2 tablets (200 mg total) by mouth daily. 12/21/14  Yes [name], MD  traZODone (DESYREL) 100 MG tablet Take 1 tablet (100 mg total) by mouth at bedtime. 12/21/14  Yes [name], MD  albuterol (PROVENTIL HFA;VENTOLIN HFA) 108 (90 BASE) MCG/ACT inhaler Inhale 2 puffs into the lungs every 6 (six) hours as needed for wheezing or shortness of breath. Patient not taking: Reported on 03/01/2015 10/08/14   [name], MD  aspirin EC 81 MG EC tablet Take 1 tablet (81 mg total) by mouth daily. Patient not taking: Reported on 07/04/2015 12/11/12   Penny Pia, MD  fluconazole (DIFLUCAN) 150 MG tablet Take 1 tablet (150 mg total) by mouth once. Repeat if needed Patient not taking: Reported on 07/04/2015 08/24/14   [name], MD  zoster vaccine live, PF, (ZOSTAVAX) 16109 UNT/0.65ML injection Inject 19,400 Units into the skin once. Patient not taking: Reported on 03/01/2015 12/21/14   [name], MD    Allergies:  Allergies  Allergen Reactions   Empagliflozin Other (See Comments)    Causes yeast infection   Empagliflozin-Linagliptin Other (See Comments)    Causes yeast infection   Other Nausea Only    States she can't tolerate seafood smell    Social History   Social History   Marital Status: Married    Spouse Name: [name]   Number of Children: 0   Years of Education: Assoc.   Occupational History    [employer]   Social History Main Topics   Smoking status: Former Smoker    Types: Cigarettes    Quit date: 06/30/2005   Smokeless tobacco: Never Used     Comment: electronic cigarettes PRN   Alcohol Use: No     Comment: quit: 2007   Drug Use: No     Comment: occasional   Sexual Activity: Yes   Other Topics Concern   Not on file   Social  History Narrative   Patient lives at home with her spouse.   Caffeine Use: 5-7 caffeine drinks daily    Review of Systems: Constitutional: negative for chills, fever, night sweats, weight changes, or fatigue  HEENT: negative for vision changes, hearing loss, congestion, rhinorrhea, ST, epistaxis, or sinus pressure Cardiovascular: negative for chest pain or palpitations Respiratory: negative for hemoptysis, shortness of breath, +cough, +wheezing Abdominal: negative for abdominal pain, blood in stools, nausea, vomiting, or constipation, +diarrhea Dermatological: negative for rash Neurologic: negative for headache, dizziness, or syncope All other systems reviewed and are otherwise negative with the exception to those above and in the HPI.  Physical Exam: Morbidly obese Blood pressure 120/76, pulse 98, temperature 97.7 F (36.5 C), temperature  source Oral, resp. rate 16, height 5\' 4"  (1.626 m), weight 325 lb (147.419 kg), last menstrual period 06/30/2015, SpO2 98 %., Body mass index is 55.76 kg/(m^2). General: Well developed, well nourished, in no acute distress. Head: Normocephalic, atraumatic, eyes without discharge, sclera non-icteric, nares are without discharge. Right ear canal shows healing hematoma on the floor, right canal is normal, TM's are without perforation, pearly grey and translucent with reflective cone of light bilaterally. Oral cavity moist, posterior pharynx without exudate, erythema, peritonsillar abscess, or post nasal drip.  Neck: Supple. No thyromegaly. Full ROM. No lymphadenopathy. Lungs: Bilateral wheezes. No rales or rhonchi. Breathing is unlabored. Heart: RRR with S1 S2. No murmurs, rubs, or gallops appreciated. Abdomen: Soft, non-tender, non-distended with normoactive bowel sounds. No hepatomegaly. No rebound/guarding. No obvious abdominal masses. Msk:  Strength and tone normal for age. Extremities/Skin: Warm and dry. No clubbing or cyanosis. No edema. No rashes or  suspicious lesions. Neuro: Alert and oriented X 3. Moves all extremities spontaneously. Gait is normal. CNII-XII grossly in tact. Psych:  Responds to questions appropriately with a normal affect. patient is cheerful and smiling.    Labs:  ASSESSMENT AND PLAN:  54 y.o. year old female with  1. Acute bronchitis, unspecified organism   2. Dumping syndrome    This chart was scribed in my presence and reviewed by me personally.    ICD-9-CM ICD-10-CM   1. Acute bronchitis, unspecified organism 466.0 J20.9 azithromycin (ZITHROMAX) 250 MG tablet     albuterol (PROVENTIL HFA;VENTOLIN HFA) 108 (90 BASE) MCG/ACT inhaler     HYDROcodone-homatropine (HYCODAN) 5-1.5 MG/5ML syrup  2. Dumping syndrome 564.2 K91.1 Ambulatory referral to Gastroenterology     Signed, Elvina SidleKurt Lauenstein, MD   Signed, Elvina SidleKurt Lauenstein, MD 07/04/2015 8:54 AM

## 2015-07-04 NOTE — Patient Instructions (Signed)

## 2015-07-11 ENCOUNTER — Ambulatory Visit (HOSPITAL_COMMUNITY)
Admission: RE | Admit: 2015-07-11 | Discharge: 2015-07-11 | Disposition: A | Discharge status: Home / Self Care | Payer: 59 | Source: Ambulatory Visit | Attending: Family Medicine | Admitting: Family Medicine

## 2015-07-11 ENCOUNTER — Ambulatory Visit (INDEPENDENT_AMBULATORY_CARE_PROVIDER_SITE_OTHER): Payer: 59

## 2015-07-11 ENCOUNTER — Other Ambulatory Visit: Payer: Self-pay | Admitting: Family Medicine

## 2015-07-11 ENCOUNTER — Ambulatory Visit (INDEPENDENT_AMBULATORY_CARE_PROVIDER_SITE_OTHER): Payer: 59 | Admitting: Family Medicine

## 2015-07-11 ENCOUNTER — Other Ambulatory Visit: Payer: Self-pay | Admitting: Physician Assistant

## 2015-07-11 ENCOUNTER — Telehealth: Payer: Self-pay

## 2015-07-11 VITALS — BP 130/80 | HR 105 | Temp 98.0°F | Resp 17 | Ht 64.0 in | Wt 322.0 lb

## 2015-07-11 DIAGNOSIS — R938 Abnormal findings on diagnostic imaging of other specified body structures: Secondary | ICD-10-CM | POA: Insufficient documentation

## 2015-07-11 DIAGNOSIS — F4323 Adjustment disorder with mixed anxiety and depressed mood: Secondary | ICD-10-CM

## 2015-07-11 DIAGNOSIS — J189 Pneumonia, unspecified organism: Secondary | ICD-10-CM

## 2015-07-11 DIAGNOSIS — R05 Cough: Secondary | ICD-10-CM

## 2015-07-11 DIAGNOSIS — F3131 Bipolar disorder, current episode depressed, mild: Secondary | ICD-10-CM | POA: Diagnosis not present

## 2015-07-11 DIAGNOSIS — R911 Solitary pulmonary nodule: Secondary | ICD-10-CM | POA: Diagnosis not present

## 2015-07-11 DIAGNOSIS — E041 Nontoxic single thyroid nodule: Secondary | ICD-10-CM | POA: Diagnosis not present

## 2015-07-11 DIAGNOSIS — R059 Cough, unspecified: Secondary | ICD-10-CM

## 2015-07-11 DIAGNOSIS — R9389 Abnormal findings on diagnostic imaging of other specified body structures: Secondary | ICD-10-CM

## 2015-07-11 LAB — POCT CBC
Granulocyte percent: 76.1 %G (ref 37–80)
HCT, POC: 43.3 % (ref 37.7–47.9)
Hemoglobin: 14.6 g/dL (ref 12.2–16.2)
Lymph, poc: 2.4 (ref 0.6–3.4)
MCH, POC: 29.3 pg (ref 27–31.2)
MCHC: 33.7 g/dL (ref 31.8–35.4)
MCV: 87 fL (ref 80–97)
MID (cbc): 0.2 (ref 0–0.9)
MPV: 6.9 fL (ref 0–99.8)
POC Granulocyte: 8.3 — AB (ref 2–6.9)
POC LYMPH PERCENT: 21.9 %L (ref 10–50)
POC MID %: 2 %M (ref 0–12)
Platelet Count, POC: 337 10*3/uL (ref 142–424)
RBC: 4.98 M/uL (ref 4.04–5.48)
RDW, POC: 14.1 %
WBC: 10.9 10*3/uL — AB (ref 4.6–10.2)

## 2015-07-11 MED ORDER — LAMOTRIGINE 200 MG PO TABS: 400.0000 mg | ORAL_TABLET | Freq: Every evening | ORAL | Status: DC

## 2015-07-11 MED ORDER — CLONAZEPAM 2 MG PO TABS: ORAL_TABLET | ORAL | Status: DC

## 2015-07-11 MED ORDER — GABAPENTIN 300 MG PO CAPS: ORAL_CAPSULE | ORAL | Status: DC

## 2015-07-11 MED ORDER — LEVOFLOXACIN 500 MG PO TABS: 500.0000 mg | ORAL_TABLET | Freq: Every day | ORAL | Status: DC

## 2015-07-11 MED ORDER — SERTRALINE HCL 100 MG PO TABS: 200.0000 mg | ORAL_TABLET | Freq: Every day | ORAL | Status: DC

## 2015-07-11 NOTE — Patient Instructions (Signed)
The chest x-ray has a subtle abnormality which may be just because of the way were positioned. Nevertheless, the radiologist recommended we get a CAT scan to rule out a pneumonia.

## 2015-07-11 NOTE — Telephone Encounter (Signed)
Left message to return call 

## 2015-07-11 NOTE — Telephone Encounter (Signed)
CT scan shows pneumonia and thyroid nodule.  New antibiotic called in.  May take robitussin for the cough. I have ordered a thyroid ultrasound as well.

## 2015-07-11 NOTE — Telephone Encounter (Signed)
Patient returned call, notified and voiced understanding.  

## 2015-07-11 NOTE — Telephone Encounter (Signed)
Dr Milus GlazierLauenstein, what should I advise pt?

## 2015-07-11 NOTE — Progress Notes (Addendum)
This chart was scribed for [name], MD by Stann Ore, medical scribe at [facility].The patient was seen in exam room 11 and the patient's care was started at 11:54 AM.  Patient ID: Alicia Lucero MRN: [id number], DOB: 12-29-1960, 54 y.o. Date of Encounter: 07/11/2015  Primary Physician: [name], MD  Chief Complaint:  Chief Complaint  Patient presents with   Shortness of Breath    HPI:  Alicia Lucero is a 54 y.o. female who presents to [facility] complaining of shortness of breath with coughs.  She feels even worse, and she can't get any sleep at night. She's tried taking melatonin, benadryl and klonopin but without relief. She feels sore, dizzy and sweats constantly after any physical activity. She had trouble driving.   She measured her sugar recently at 155.   She also mentions that her medications weren't called in.   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 Authorizing Provider  ACCU-CHEK AVIVA PLUS test strip USE AS DIRECTED 05/07/14  Yes [name], MD  ACCU-CHEK SOFTCLIX LANCETS lancets USE AS DIRECTED   Yes Heather M Marte, PA-C  albuterol (PROVENTIL HFA;VENTOLIN HFA) 108 (90 BASE) MCG/ACT inhaler Inhale 2 puffs into the lungs every 6 (six) hours as needed for wheezing or shortness of breath. 10/08/14  Yes [name], MD  albuterol (PROVENTIL HFA;VENTOLIN HFA) 108 (90 BASE) MCG/ACT inhaler Inhale 2 puffs into the lungs every 4 (four) hours as needed for wheezing or shortness of breath (cough, shortness of breath or wheezing.). 07/04/15  Yes [name], MD  aspirin EC 81 MG EC tablet Take 1 tablet (81 mg  total) by mouth daily. 12/11/12  Yes Penny Pia, MD  Aspirin-Acetaminophen-Caffeine (EXCEDRIN PO) Take by mouth as needed.   Yes Historical Provider, MD  B Complex-C (B-COMPLEX WITH VITAMIN C) tablet Take 1 tablet by mouth daily.   Yes Historical Provider, MD  carbamazepine (TEGRETOL) 200 MG tablet Take 1.5 tablets (300 mg total) by mouth 3 (three) times daily. 12/21/14  Yes [name], MD  clonazePAM (KLONOPIN) 2 MG tablet TAKE 1 TABLET BY MOUTH 3 TIMES DAILY AS NEEDED 12/21/14  Yes [name], MD  fluconazole (DIFLUCAN) 150 MG tablet Take 1 tablet (150 mg total) by mouth once. Repeat if needed 08/24/14  Yes [name], MD  gabapentin (NEURONTIN) 300 MG capsule TAKE 2 CAPSULES BY MOUTH THREE TIMES A DAY 06/06/15  Yes Morrell Riddle, PA-C  HYDROcodone-homatropine (HYCODAN) 5-1.5 MG/5ML syrup Take 5 mLs by mouth every 8 (eight) hours as needed for cough. 07/04/15  Yes [name], MD  Insulin Detemir (LEVEMIR FLEXTOUCH) 100 UNIT/ML Pen Inject 40 Units into the skin daily. 06/08/15  Yes [name], MD  Insulin Pen Needle 31G X 8 MM MISC 1 each by Does not apply route daily. 10/09/14  Yes Chelle Jeffery, PA-C  lamoTRIgine (LAMICTAL) 200 MG tablet Take 2 tablets (400 mg total) by mouth every evening. 12/21/14  Yes [name], MD  Liraglutide (VICTOZA) 18 MG/3ML SOPN Inject into the skin.   Yes Historical Provider, MD  lisinopril (PRINIVIL,ZESTRIL) 5 MG tablet Take 0.5 tablets (2.5  mg total) by mouth daily. 03/01/15  Yes Todd McVeigh, PA  metFORMIN (GLUCOPHAGE) 500 MG tablet Take 500 mg by mouth 2 (two) times daily with a meal. 2 tablets bid   Yes Historical Provider, MD  Multiple Vitamins-Minerals (WOMENS MULTIVITAMIN PLUS PO) Take 1 tablet by mouth daily.    Yes Historical Provider, MD  mupirocin ointment (BACTROBAN) 2 % APPLY TO AFFECTED AREAS TWICE DAILY 09/22/13  Yes [name], MD  naproxen sodium (ANAPROX) 220 MG tablet Take 220 mg by mouth 2 (two) times daily as needed (for  pain).   Yes Historical Provider, MD  Omega-3 Fatty Acids (FISH OIL PO) Take 2 capsules by mouth 2 (two) times daily.   Yes Historical Provider, MD  ranitidine (ZANTAC) 150 MG capsule Take 150 mg by mouth 2 (two) times daily.   Yes Historical Provider, MD  sertraline (ZOLOFT) 100 MG tablet Take 2 tablets (200 mg total) by mouth daily. 12/21/14  Yes [name], MD  traZODone (DESYREL) 100 MG tablet Take 1 tablet (100 mg total) by mouth at bedtime. 12/21/14  Yes [name], MD  zoster vaccine live, PF, (ZOSTAVAX) 91478 UNT/0.65ML injection Inject 19,400 Units into the skin once. 12/21/14  Yes [name], MD  azithromycin (ZITHROMAX) 250 MG tablet Take 2 tabs PO x 1 dose, then 1 tab PO QD x 4 days Patient not taking: Reported on 07/11/2015 07/04/15   [name], MD    Allergies:  Allergies  Allergen Reactions   Empagliflozin Other (See Comments)    Causes yeast infection   Empagliflozin-Linagliptin Other (See Comments)    Causes yeast infection   Other Nausea Only    States she can't tolerate seafood smell    Social History   Social History   Marital Status: Married    Spouse Name: [name]   Number of Children: 0   Years of Education: Assoc.   Occupational History    [employer]   Social History Main Topics   Smoking status: Former Smoker    Types: Cigarettes    Quit date: 06/30/2005   Smokeless tobacco: Never Used     Comment: electronic cigarettes PRN   Alcohol Use: No     Comment: quit: 2007   Drug Use: No     Comment: occasional   Sexual Activity: Yes   Other Topics Concern   Not on file   Social History Narrative   Patient lives at home with her spouse.   Caffeine Use: 5-7 caffeine drinks daily     Review of Systems: Constitutional: negative for fever, weight changes; positive for chills, night sweats, fatigue HEENT: negative for vision changes, hearing loss, congestion, rhinorrhea, ST, epistaxis, or sinus  pressure Cardiovascular: negative for chest pain or palpitations Respiratory: negative for hemoptysis, wheezing; positive for shortness of breath, cough Abdominal: negative for abdominal pain, nausea, vomiting, diarrhea, or constipation Dermatological: negative for rash Neurologic: negative for headache, or syncope; positive for dizziness Musc: positive for myalgia (general) All other systems reviewed and are otherwise negative with the exception to those above and in the HPI.  Physical Exam: Blood pressure 130/80, pulse 105, temperature 98 F (36.7 C), temperature source Oral, resp. rate 17, height  (1.626 m), weight 322 lb (146.058 kg), last menstrual period 06/30/2015, SpO2 96 %., Body mass index is 55.24 kg/(m^2). General: Well developed, well nourished, in no acute distress. Head: Normocephalic, atraumatic, eyes without discharge, sclera non-icteric, nares are without discharge. Bilateral auditory canals clear, TM's are without perforation, pearly grey  and translucent with reflective cone of light bilaterally. Oral cavity moist, posterior pharynx without exudate, erythema, peritonsillar abscess, or post nasal drip.  Neck: Supple. No thyromegaly. Full ROM. No lymphadenopathy. Lungs: Clear bilaterally to auscultation without wheezes, or rhonchi. Rales in both lung fields anteriorally  Heart: RRR with S1 S2. No murmurs, rubs, or gallops appreciated. Abdomen: Soft, non-tender, non-distended with normoactive bowel sounds. No hepatomegaly. No rebound/guarding. No obvious abdominal masses. Msk:  Strength and tone normal for age. Extremities/Skin: Warm and dry. No clubbing or cyanosis. No edema. No rashes or suspicious lesions. Neuro: Alert and oriented X 3. Moves all extremities spontaneously. Gait is normal. CNII-XII grossly in tact. Psych:  Responds to questions appropriately with a normal affect.   [facility] reading (PRIMARY) by Dr. Milus GlazierLauenstein : x ray chest: round density in right hilar area    Results for orders placed or performed in visit on 07/11/15  POCT CBC  Result Value Ref Range   WBC 10.9 (A) 4.6 - 10.2 K/uL   Lymph, poc 2.4 0.6 - 3.4   POC LYMPH PERCENT 21.9 10 - 50 %L   MID (cbc) 0.2 0 - 0.9   POC MID % 2.0 0 - 12 %M   POC Granulocyte 8.3 (A) 2 - 6.9   Granulocyte percent 76.1 37 - 80 %G   RBC 4.98 4.04 - 5.48 M/uL   Hemoglobin 14.6 12.2 - 16.2 g/dL   HCT, POC 16.143.3 09.637.7 - 47.9 %   MCV 87.0 80 - 97 fL   MCH, POC 29.3 27 - 31.2 pg   MCHC 33.7 31.8 - 35.4 g/dL   RDW, POC 04.514.1 %   Platelet Count, POC 337 142 - 424 K/uL   MPV 6.9 0 - 99.8 fL    ASSESSMENT AND PLAN:  54 y.o. year old female with abnormal chest x-ray and severe cough. She also needs refills on her medicine for bipolar affective disorder   This chart was scribed in my presence and reviewed by me personally.    ICD-9-CM ICD-10-CM   1. Cough 786.2 R05 POCT CBC     DG Chest 2 View     CT Chest Wo Contrast  2. Adjustment disorder with mixed anxiety and depressed mood 309.28 F43.23 clonazePAM (KLONOPIN) 2 MG tablet  3. Bipolar affective disorder, currently depressed, mild (HCC) 296.51 F31.31 lamoTRIgine (LAMICTAL) 200 MG tablet     sertraline (ZOLOFT) 100 MG tablet  4. Abnormal chest x-ray 793.2 R93.8 CT Chest Wo Contrast     By signing my name below, I, Stann Oresung-Kai Tsai, attest that this documentation has been prepared under the direction and in the presence of Elvina SidleKurt Lauenstein, MD. Electronically Signed: Stann Oresung-Kai Tsai, Scribe. 07/11/2015 , 11:54 AM .  Signed, Elvina SidleKurt Lauenstein, MD 07/11/2015 11:54 AM

## 2015-07-11 NOTE — Telephone Encounter (Signed)
Pt states she really need to speak with Dr Kenyon AnaKurt states she isn't getting any better and need to know some results Please call [phone]

## 2015-07-19 ENCOUNTER — Ambulatory Visit (INDEPENDENT_AMBULATORY_CARE_PROVIDER_SITE_OTHER): Payer: 59 | Admitting: Family Medicine

## 2015-07-19 ENCOUNTER — Emergency Department (HOSPITAL_COMMUNITY): Payer: 59

## 2015-07-19 ENCOUNTER — Encounter (HOSPITAL_COMMUNITY): Payer: Self-pay

## 2015-07-19 ENCOUNTER — Observation Stay (HOSPITAL_COMMUNITY)
Admission: EM | Admit: 2015-07-19 | Discharge: 2015-07-23 | Disposition: A | Discharge status: Home / Self Care | Payer: 59 | Attending: Internal Medicine | Admitting: Internal Medicine

## 2015-07-19 ENCOUNTER — Ambulatory Visit (INDEPENDENT_AMBULATORY_CARE_PROVIDER_SITE_OTHER): Payer: 59

## 2015-07-19 VITALS — BP 145/87 | HR 88 | Temp 97.6°F | Resp 20 | Ht 64.0 in

## 2015-07-19 DIAGNOSIS — J181 Lobar pneumonia, unspecified organism: Secondary | ICD-10-CM | POA: Diagnosis not present

## 2015-07-19 DIAGNOSIS — R0602 Shortness of breath: Secondary | ICD-10-CM | POA: Diagnosis not present

## 2015-07-19 DIAGNOSIS — R0600 Dyspnea, unspecified: Secondary | ICD-10-CM

## 2015-07-19 DIAGNOSIS — E1169 Type 2 diabetes mellitus with other specified complication: Secondary | ICD-10-CM | POA: Diagnosis not present

## 2015-07-19 DIAGNOSIS — J9601 Acute respiratory failure with hypoxia: Secondary | ICD-10-CM | POA: Diagnosis not present

## 2015-07-19 DIAGNOSIS — E282 Polycystic ovarian syndrome: Secondary | ICD-10-CM | POA: Diagnosis not present

## 2015-07-19 DIAGNOSIS — K219 Gastro-esophageal reflux disease without esophagitis: Secondary | ICD-10-CM | POA: Insufficient documentation

## 2015-07-19 DIAGNOSIS — Z79899 Other long term (current) drug therapy: Secondary | ICD-10-CM | POA: Diagnosis not present

## 2015-07-19 DIAGNOSIS — F319 Bipolar disorder, unspecified: Secondary | ICD-10-CM | POA: Diagnosis not present

## 2015-07-19 DIAGNOSIS — Z87891 Personal history of nicotine dependence: Secondary | ICD-10-CM | POA: Diagnosis not present

## 2015-07-19 DIAGNOSIS — Z794 Long term (current) use of insulin: Secondary | ICD-10-CM | POA: Insufficient documentation

## 2015-07-19 DIAGNOSIS — R5383 Other fatigue: Secondary | ICD-10-CM

## 2015-07-19 DIAGNOSIS — Z791 Long term (current) use of non-steroidal anti-inflammatories (NSAID): Secondary | ICD-10-CM | POA: Diagnosis not present

## 2015-07-19 DIAGNOSIS — B379 Candidiasis, unspecified: Secondary | ICD-10-CM | POA: Diagnosis not present

## 2015-07-19 DIAGNOSIS — E1140 Type 2 diabetes mellitus with diabetic neuropathy, unspecified: Secondary | ICD-10-CM | POA: Diagnosis not present

## 2015-07-19 DIAGNOSIS — J45901 Unspecified asthma with (acute) exacerbation: Secondary | ICD-10-CM | POA: Insufficient documentation

## 2015-07-19 DIAGNOSIS — B356 Tinea cruris: Secondary | ICD-10-CM

## 2015-07-19 DIAGNOSIS — E785 Hyperlipidemia, unspecified: Secondary | ICD-10-CM | POA: Insufficient documentation

## 2015-07-19 DIAGNOSIS — D72829 Elevated white blood cell count, unspecified: Secondary | ICD-10-CM | POA: Diagnosis not present

## 2015-07-19 DIAGNOSIS — F329 Major depressive disorder, single episode, unspecified: Secondary | ICD-10-CM

## 2015-07-19 DIAGNOSIS — I10 Essential (primary) hypertension: Secondary | ICD-10-CM | POA: Diagnosis present

## 2015-07-19 DIAGNOSIS — Z6841 Body Mass Index (BMI) 40.0 and over, adult: Secondary | ICD-10-CM

## 2015-07-19 DIAGNOSIS — F32A Depression, unspecified: Secondary | ICD-10-CM | POA: Diagnosis present

## 2015-07-19 DIAGNOSIS — J189 Pneumonia, unspecified organism: Secondary | ICD-10-CM | POA: Diagnosis present

## 2015-07-19 DIAGNOSIS — J45909 Unspecified asthma, uncomplicated: Secondary | ICD-10-CM | POA: Diagnosis present

## 2015-07-19 LAB — POCT CBC
Granulocyte percent: 61.2 %G (ref 37–80)
HCT, POC: 37 % — AB (ref 37.7–47.9)
Hemoglobin: 12.9 g/dL (ref 12.2–16.2)
Lymph, poc: 3.7 — AB (ref 0.6–3.4)
MCH, POC: 30.3 pg (ref 27–31.2)
MCHC: 35 g/dL (ref 31.8–35.4)
MCV: 86.7 fL (ref 80–97)
MID (cbc): 1.1 — AB (ref 0–0.9)
MPV: 7.4 fL (ref 0–99.8)
POC Granulocyte: 7.5 — AB (ref 2–6.9)
POC LYMPH PERCENT: 30.2 %L (ref 10–50)
POC MID %: 8.6 %M (ref 0–12)
Platelet Count, POC: 336 10*3/uL (ref 142–424)
RBC: 4.27 M/uL (ref 4.04–5.48)
RDW, POC: 14.3 %
WBC: 12.3 10*3/uL — AB (ref 4.6–10.2)

## 2015-07-19 LAB — BASIC METABOLIC PANEL
ANION GAP: 9 (ref 5–15)
BUN: 11 mg/dL (ref 6–20)
CALCIUM: 9.3 mg/dL (ref 8.9–10.3)
CO2: 26 mmol/L (ref 22–32)
Chloride: 103 mmol/L (ref 101–111)
Creatinine, Ser: 0.68 mg/dL (ref 0.44–1.00)
GLUCOSE: 149 mg/dL — AB (ref 65–99)
POTASSIUM: 3.8 mmol/L (ref 3.5–5.1)
Sodium: 138 mmol/L (ref 135–145)

## 2015-07-19 LAB — CBC WITH DIFFERENTIAL/PLATELET
BASOS ABS: 0 10*3/uL (ref 0.0–0.1)
Basophils Relative: 0 %
EOS ABS: 0.4 10*3/uL (ref 0.0–0.7)
EOS PCT: 4 %
HCT: 38.7 % (ref 36.0–46.0)
Hemoglobin: 12.8 g/dL (ref 12.0–15.0)
Lymphocytes Relative: 29 %
Lymphs Abs: 3.3 10*3/uL (ref 0.7–4.0)
MCH: 29.8 pg (ref 26.0–34.0)
MCHC: 33.1 g/dL (ref 30.0–36.0)
MCV: 90.2 fL (ref 78.0–100.0)
Monocytes Absolute: 0.8 10*3/uL (ref 0.1–1.0)
Monocytes Relative: 7 %
NEUTROS PCT: 60 %
Neutro Abs: 6.9 10*3/uL (ref 1.7–7.7)
PLATELETS: 318 10*3/uL (ref 150–400)
RBC: 4.29 MIL/uL (ref 3.87–5.11)
RDW: 13.4 % (ref 11.5–15.5)
WBC: 11.5 10*3/uL — AB (ref 4.0–10.5)

## 2015-07-19 LAB — GLUCOSE, CAPILLARY
GLUCOSE-CAPILLARY: 422 mg/dL — AB (ref 65–99)
Glucose-Capillary: 229 mg/dL — ABNORMAL HIGH (ref 65–99)

## 2015-07-19 LAB — BRAIN NATRIURETIC PEPTIDE: B Natriuretic Peptide: 24.5 pg/mL (ref 0.0–100.0)

## 2015-07-19 LAB — TROPONIN I: Troponin I: <0.03 ng/mL (ref <0.031)

## 2015-07-19 MED ORDER — CLONAZEPAM 1 MG PO TABS
2.0000 mg | ORAL_TABLET | Freq: Three times a day (TID) | ORAL | Status: DC | PRN
Start: 2015-07-19 — End: 2015-07-23
  Administered 2015-07-19 – 2015-07-22 (×8): 2 mg via ORAL
  Filled 2015-07-19 (×9): qty 2

## 2015-07-19 MED ORDER — ADULT MULTIVITAMIN W/MINERALS CH
ORAL_TABLET | Freq: Every day | ORAL | Status: DC
  Administered 2015-07-20 – 2015-07-22 (×3): 1 via ORAL
  Filled 2015-07-19 (×4): qty 1

## 2015-07-19 MED ORDER — OMEGA-3-ACID ETHYL ESTERS 1 G PO CAPS
1.0000 g | ORAL_CAPSULE | Freq: Every day | ORAL | Status: DC
  Administered 2015-07-20 – 2015-07-23 (×4): 1 g via ORAL
  Filled 2015-07-19 (×4): qty 1

## 2015-07-19 MED ORDER — FLUCONAZOLE 100 MG PO TABS
100.0000 mg | ORAL_TABLET | Freq: Every day | ORAL | Status: DC
  Administered 2015-07-19 – 2015-07-20 (×2): 100 mg via ORAL
  Filled 2015-07-19 (×2): qty 1

## 2015-07-19 MED ORDER — HYDROMORPHONE HCL 1 MG/ML IJ SOLN
1.0000 mg | INTRAMUSCULAR | Status: DC | PRN
  Administered 2015-07-19 – 2015-07-23 (×11): 1 mg via INTRAVENOUS
  Filled 2015-07-19 (×11): qty 1

## 2015-07-19 MED ORDER — SERTRALINE HCL 100 MG PO TABS
200.0000 mg | ORAL_TABLET | Freq: Every day | ORAL | Status: DC
  Administered 2015-07-20 – 2015-07-23 (×4): 200 mg via ORAL
  Filled 2015-07-19 (×4): qty 2

## 2015-07-19 MED ORDER — CARBAMAZEPINE 200 MG PO TABS
200.0000 mg | ORAL_TABLET | Freq: Three times a day (TID) | ORAL | Status: DC
  Administered 2015-07-19 – 2015-07-23 (×11): 200 mg via ORAL
  Filled 2015-07-19 (×13): qty 1

## 2015-07-19 MED ORDER — DEXTROSE 5 % IV SOLN
1.0000 g | Freq: Once | INTRAVENOUS | Status: AC
  Administered 2015-07-19: 1 g via INTRAVENOUS
  Filled 2015-07-19: qty 10

## 2015-07-19 MED ORDER — METHYLPREDNISOLONE SODIUM SUCC 125 MG IJ SOLR
125.0000 mg | Freq: Once | INTRAMUSCULAR | Status: AC
  Administered 2015-07-19: 125 mg via INTRAVENOUS
  Filled 2015-07-19: qty 2

## 2015-07-19 MED ORDER — LIRAGLUTIDE 18 MG/3ML ~~LOC~~ SOPN
1.8000 mg | PEN_INJECTOR | Freq: Every day | SUBCUTANEOUS | Status: DC
  Administered 2015-07-21 – 2015-07-23 (×3): 1.8 mg via SUBCUTANEOUS
  Filled 2015-07-19: qty 3

## 2015-07-19 MED ORDER — KETOCONAZOLE 2 % EX CREA: 1.0000 "application " | TOPICAL_CREAM | Freq: Two times a day (BID) | CUTANEOUS | Status: DC | PRN

## 2015-07-19 MED ORDER — SODIUM CHLORIDE 0.9 % IJ SOLN
3.0000 mL | Freq: Two times a day (BID) | INTRAMUSCULAR | Status: DC
  Administered 2015-07-19 – 2015-07-22 (×4): 3 mL via INTRAVENOUS

## 2015-07-19 MED ORDER — NAPROXEN 250 MG PO TABS
250.0000 mg | ORAL_TABLET | Freq: Two times a day (BID) | ORAL | Status: DC | PRN
  Filled 2015-07-19 (×2): qty 1

## 2015-07-19 MED ORDER — NYSTATIN 100000 UNIT/GM EX POWD
Freq: Two times a day (BID) | CUTANEOUS | Status: DC
  Administered 2015-07-19 – 2015-07-23 (×8): via TOPICAL
  Filled 2015-07-19 (×2): qty 15

## 2015-07-19 MED ORDER — DEXTROSE 5 % IV SOLN
500.0000 mg | INTRAVENOUS | Status: DC
  Administered 2015-07-19 – 2015-07-21 (×3): 500 mg via INTRAVENOUS
  Filled 2015-07-19 (×3): qty 500

## 2015-07-19 MED ORDER — INSULIN ASPART 100 UNIT/ML ~~LOC~~ SOLN
0.0000 [IU] | Freq: Three times a day (TID) | SUBCUTANEOUS | Status: DC
  Administered 2015-07-20: 20 [IU] via SUBCUTANEOUS
  Administered 2015-07-20: 11 [IU] via SUBCUTANEOUS
  Administered 2015-07-20 – 2015-07-21 (×2): 7 [IU] via SUBCUTANEOUS
  Administered 2015-07-21 – 2015-07-22 (×4): 3 [IU] via SUBCUTANEOUS
  Administered 2015-07-23: 4 [IU] via SUBCUTANEOUS

## 2015-07-19 MED ORDER — GABAPENTIN 300 MG PO CAPS
300.0000 mg | ORAL_CAPSULE | Freq: Three times a day (TID) | ORAL | Status: DC
  Administered 2015-07-19 – 2015-07-23 (×11): 300 mg via ORAL
  Filled 2015-07-19 (×13): qty 1

## 2015-07-19 MED ORDER — DEXTROSE 5 % IV SOLN: 500.0000 mg | Freq: Once | INTRAVENOUS | Status: DC

## 2015-07-19 MED ORDER — INSULIN ASPART 100 UNIT/ML ~~LOC~~ SOLN
5.0000 [IU] | Freq: Once | SUBCUTANEOUS | Status: AC
  Administered 2015-07-19: 5 [IU] via SUBCUTANEOUS

## 2015-07-19 MED ORDER — TRAZODONE HCL 50 MG PO TABS
100.0000 mg | ORAL_TABLET | Freq: Every evening | ORAL | Status: DC | PRN
  Administered 2015-07-22 (×2): 100 mg via ORAL
  Filled 2015-07-19 (×2): qty 2

## 2015-07-19 MED ORDER — INSULIN DETEMIR 100 UNIT/ML ~~LOC~~ SOLN
40.0000 [IU] | Freq: Every day | SUBCUTANEOUS | Status: DC
  Administered 2015-07-19 – 2015-07-20 (×3): 40 [IU] via SUBCUTANEOUS
  Filled 2015-07-19 (×3): qty 0.4

## 2015-07-19 MED ORDER — ALBUTEROL SULFATE HFA 108 (90 BASE) MCG/ACT IN AERS: 2.0000 | INHALATION_SPRAY | Freq: Four times a day (QID) | RESPIRATORY_TRACT | Status: DC | PRN

## 2015-07-19 MED ORDER — ALBUTEROL SULFATE (2.5 MG/3ML) 0.083% IN NEBU: 2.5000 mg | INHALATION_SOLUTION | Freq: Four times a day (QID) | RESPIRATORY_TRACT | Status: DC | PRN

## 2015-07-19 MED ORDER — INSULIN ASPART 100 UNIT/ML ~~LOC~~ SOLN
0.0000 [IU] | Freq: Every day | SUBCUTANEOUS | Status: DC
  Administered 2015-07-19: 5 [IU] via SUBCUTANEOUS
  Administered 2015-07-20 – 2015-07-22 (×2): 2 [IU] via SUBCUTANEOUS

## 2015-07-19 MED ORDER — IPRATROPIUM-ALBUTEROL 0.5-2.5 (3) MG/3ML IN SOLN
3.0000 mL | Freq: Once | RESPIRATORY_TRACT | Status: AC
  Administered 2015-07-19: 3 mL via RESPIRATORY_TRACT
  Filled 2015-07-19: qty 3

## 2015-07-19 MED ORDER — ALBUTEROL SULFATE (2.5 MG/3ML) 0.083% IN NEBU
2.5000 mg | INHALATION_SOLUTION | Freq: Once | RESPIRATORY_TRACT | Status: AC
  Administered 2015-07-19: 2.5 mg via RESPIRATORY_TRACT

## 2015-07-19 MED ORDER — SODIUM CHLORIDE 0.9 % IV SOLN
INTRAVENOUS | Status: DC
  Administered 2015-07-19: 20:00:00 via INTRAVENOUS

## 2015-07-19 MED ORDER — NAPROXEN SODIUM 220 MG PO TABS: 220.0000 mg | ORAL_TABLET | Freq: Two times a day (BID) | ORAL | Status: DC | PRN

## 2015-07-19 MED ORDER — IPRATROPIUM BROMIDE 0.02 % IN SOLN
0.5000 mg | Freq: Once | RESPIRATORY_TRACT | Status: AC
  Administered 2015-07-19: 0.5 mg via RESPIRATORY_TRACT

## 2015-07-19 MED ORDER — DEXTROSE 5 % IV SOLN
1.0000 g | INTRAVENOUS | Status: DC
  Administered 2015-07-19 – 2015-07-22 (×4): 1 g via INTRAVENOUS
  Filled 2015-07-19 (×4): qty 10

## 2015-07-19 MED ORDER — PREDNISONE 50 MG PO TABS
50.0000 mg | ORAL_TABLET | Freq: Every day | ORAL | Status: DC
  Administered 2015-07-20: 50 mg via ORAL
  Filled 2015-07-19: qty 1

## 2015-07-19 MED ORDER — IPRATROPIUM-ALBUTEROL 0.5-2.5 (3) MG/3ML IN SOLN
3.0000 mL | RESPIRATORY_TRACT | Status: DC | PRN
  Administered 2015-07-20 – 2015-07-21 (×2): 3 mL via RESPIRATORY_TRACT
  Filled 2015-07-19 (×3): qty 3

## 2015-07-19 MED ORDER — METFORMIN HCL 500 MG PO TABS
500.0000 mg | ORAL_TABLET | Freq: Two times a day (BID) | ORAL | Status: DC
  Administered 2015-07-20 – 2015-07-23 (×7): 500 mg via ORAL
  Filled 2015-07-19 (×10): qty 1

## 2015-07-19 MED ORDER — KETOCONAZOLE 2 % EX CREA: 1.0000 "application " | TOPICAL_CREAM | Freq: Two times a day (BID) | CUTANEOUS | Status: DC

## 2015-07-19 MED ORDER — ONDANSETRON HCL 4 MG/2ML IJ SOLN: 4.0000 mg | Freq: Four times a day (QID) | INTRAMUSCULAR | Status: DC | PRN

## 2015-07-19 MED ORDER — LISINOPRIL 2.5 MG PO TABS
2.5000 mg | ORAL_TABLET | Freq: Every day | ORAL | Status: DC
  Administered 2015-07-19 – 2015-07-23 (×5): 2.5 mg via ORAL
  Filled 2015-07-19 (×6): qty 1

## 2015-07-19 MED ORDER — B COMPLEX-C PO TABS
1.0000 | ORAL_TABLET | Freq: Every day | ORAL | Status: DC
  Administered 2015-07-20 – 2015-07-23 (×4): 1 via ORAL
  Filled 2015-07-19 (×4): qty 1

## 2015-07-19 MED ORDER — FAMOTIDINE 20 MG PO TABS
20.0000 mg | ORAL_TABLET | Freq: Every day | ORAL | Status: DC
Start: 2015-07-20 — End: 2015-07-23
  Administered 2015-07-20 – 2015-07-23 (×4): 20 mg via ORAL
  Filled 2015-07-19 (×4): qty 1

## 2015-07-19 MED ORDER — ONDANSETRON HCL 4 MG PO TABS: 4.0000 mg | ORAL_TABLET | Freq: Four times a day (QID) | ORAL | Status: DC | PRN

## 2015-07-19 MED ORDER — LAMOTRIGINE 200 MG PO TABS
400.0000 mg | ORAL_TABLET | Freq: Every evening | ORAL | Status: DC
  Administered 2015-07-19 – 2015-07-22 (×4): 400 mg via ORAL
  Filled 2015-07-19 (×5): qty 2

## 2015-07-19 MED ORDER — ZOLPIDEM TARTRATE 5 MG PO TABS: 5.0000 mg | ORAL_TABLET | Freq: Every day | ORAL | Status: DC

## 2015-07-19 NOTE — ED Provider Notes (Signed)
CSN: [id number]     Arrival date & time 07/19/15  1441 History   First MD Initiated Contact with Patient 07/19/15 1619     Chief Complaint  Patient presents with  . Shortness of Breath     (Consider location/radiation/quality/duration/timing/severity/associated sxs/prior Treatment) HPI Comments: Patient is a 54 year old female with history of morbid obesity, insulin-dependent diabetes, and hypertension. She presents at the request of her primary Dr. for evaluation of pneumonia. She has been treated for pneumonia with Zithromax and Levaquin, however continues with rattling in her chest and productive cough. She denies fevers or chills. She denies chest pain. She denies any prior cardiac history.  Patient is a 54 y.o. female presenting with shortness of breath. The history is provided by the patient.  Shortness of Breath Severity:  Moderate Onset quality:  Sudden Duration:  2 weeks Timing:  Constant Progression:  Worsening Chronicity:  New Relieved by:  Nothing Worsened by:  Nothing tried Ineffective treatments:  None tried   Past Medical History  Diagnosis Date  . Depression   . GERD (gastroesophageal reflux disease)   . Anxiety   . 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 Bipolar 1 disorder (HCC)   . PCOS (polycystic ovarian syndrome)    Past Surgical History  Procedure Laterality Date  . Cholecystectomy  1996  . Tonsillectomy      as a child  . Knee arthroscopy Left 2008  . Dilation and curettage of uterus      secondary to menorrhagia  . Wisdom tooth extraction     Family History  Problem Relation Age of Onset  . Cancer Mother     lung  . Hypertension Mother   . Cancer Father     liver  . Hypertension Father   . Heart disease Father     CVD  . Multiple sclerosis Sister   . Hypertension Sister    Social History  Substance Use Topics  . Smoking status: Former Smoker    Types: Cigarettes    Quit date: 06/30/2005  .  Smokeless tobacco: Never Used     Comment: electronic cigarettes PRN  . Alcohol Use: No     Comment: quit: 2007   OB History    Gravida Para Term Preterm AB TAB SAB Ectopic Multiple Living   0     Review of Systems  Respiratory: Positive for shortness of breath.   All other systems reviewed and are negative.     Allergies  Empagliflozin; Empagliflozin-linagliptin; Hydrocodone; and Other  Home Medications   Prior to Admission medications   Medication Sig Start Date End Date Taking? Authorizing Provider  albuterol (PROVENTIL HFA;VENTOLIN HFA) 108 (90 BASE) MCG/ACT inhaler Inhale 2 puffs into the lungs every 6 (six) hours as needed for wheezing or shortness of breath. 10/08/14  Yes [name], MD  B Complex-C (B-COMPLEX WITH VITAMIN C) tablet Take 1 tablet by mouth daily.   Yes Historical Provider, MD  carbamazepine (TEGRETOL) 200 MG tablet Take 1.5 tablets (300 mg total) by mouth 3 (three) times daily. Patient taking differently: Take 200 mg by mouth 3 (three) times daily.  12/21/14  Yes [name], MD  clonazePAM (KLONOPIN) 2 MG tablet TAKE 1 TABLET BY MOUTH 3 TIMES DAILY AS NEEDED 07/11/15  Yes [name], MD  gabapentin (NEURONTIN) 300 MG capsule TAKE 2 CAPSULES BY MOUTH THREE TIMES A DAY 07/11/15  Yes [name],  MD  Insulin Detemir (LEVEMIR FLEXTOUCH) 100 UNIT/ML Pen Inject 40 Units into the skin daily. 06/08/15  Yes Elvina SidleKurt Lauenstein, MD  lamoTRIgine (LAMICTAL) 200 MG tablet Take 2 tablets (400 mg total) by mouth every evening. 07/11/15  Yes Elvina SidleKurt Lauenstein, MD  levofloxacin (LEVAQUIN) 500 MG tablet Take 1 tablet (500 mg total) by mouth daily. 07/11/15  Yes Elvina SidleKurt Lauenstein, MD  Liraglutide (VICTOZA) 18 MG/3ML SOPN Inject 1.8 mg into the skin daily.    Yes Historical Provider, MD  lisinopril (PRINIVIL,ZESTRIL) 5 MG tablet Take 0.5 tablets (2.5 mg total) by mouth daily. 03/01/15  Yes Todd McVeigh, PA  metFORMIN (GLUCOPHAGE) 500 MG tablet Take 500 mg by mouth  2 (two) times daily with a meal.    Yes Historical Provider, MD  Multiple Vitamins-Minerals (WOMENS MULTIVITAMIN PLUS PO) Take 1 tablet by mouth daily.    Yes Historical Provider, MD  naproxen sodium (ANAPROX) 220 MG tablet Take 220 mg by mouth 2 (two) times daily as needed (for pain).   Yes Historical Provider, MD  Omega-3 Fatty Acids (FISH OIL PO) Take 2 capsules by mouth 2 (two) times daily.   Yes Historical Provider, MD  ranitidine (ZANTAC) 150 MG capsule Take 150 mg by mouth 2 (two) times daily.   Yes Historical Provider, MD  sertraline (ZOLOFT) 100 MG tablet Take 2 tablets (200 mg total) by mouth daily. 07/11/15  Yes Elvina SidleKurt Lauenstein, MD  traZODone (DESYREL) 100 MG tablet Take 1 tablet (100 mg total) by mouth at bedtime. Patient taking differently: Take 100 mg by mouth at bedtime as needed for sleep.  12/21/14  Yes Elvina SidleKurt Lauenstein, MD  ACCU-CHEK AVIVA PLUS test strip USE AS DIRECTED 05/07/14   Elvina SidleKurt Lauenstein, MD  ACCU-CHEK SOFTCLIX LANCETS lancets USE AS DIRECTED    Nelva NayHeather M Marte, PA-C  albuterol (PROVENTIL HFA;VENTOLIN HFA) 108 (90 BASE) MCG/ACT inhaler Inhale 2 puffs into the lungs every 4 (four) hours as needed for wheezing or shortness of breath (cough, shortness of breath or wheezing.). Patient not taking: Reported on 07/19/2015 07/04/15   Elvina SidleKurt Lauenstein, MD  aspirin EC 81 MG EC tablet Take 1 tablet (81 mg total) by mouth daily. Patient not taking: Reported on 07/19/2015 12/11/12   Penny Piarlando Vega, MD  fluconazole (DIFLUCAN) 150 MG tablet Take 1 tablet (150 mg total) by mouth once. Repeat if needed Patient not taking: Reported on 07/19/2015 08/24/14   Elvina SidleKurt Lauenstein, MD  Insulin Pen Needle 31G X 8 MM MISC 1 each by Does not apply route daily. 10/09/14   Chelle Jeffery, PA-C  ketoconazole (NIZORAL) 2 % cream Apply 1 application topically 2 (two) times daily. Patient taking differently: Apply 1 application topically 2 (two) times daily as needed for irritation.  07/19/15   Elvina SidleKurt Lauenstein, MD   mupirocin ointment (BACTROBAN) 2 % APPLY TO AFFECTED AREAS TWICE DAILY 09/22/13   Elvina SidleKurt Lauenstein, MD  zoster vaccine live, PF, (ZOSTAVAX) [id number] UNT/0.65ML injection Inject 19,400 Units into the skin once. Patient not taking: Reported on 07/19/2015 12/21/14   Elvina SidleKurt Lauenstein, MD   BP 124/75 mmHg  Pulse 79  Temp(Src) 97.7 F (36.5 C)  Resp 20  Ht 5\' 4"  (1.626 m)  Wt 322 lb (146.058 kg)  BMI 55.24 kg/m2  SpO2 95%  LMP 07/04/2015 Physical Exam  Constitutional: She is oriented to person, place, and time. She appears well-developed and well-nourished. No distress.  HENT:  Head: Normocephalic and atraumatic.  Neck: Normal range of motion. Neck supple.  Cardiovascular: Normal rate and regular rhythm.  Exam reveals no gallop and no friction rub.   No murmur heard. Pulmonary/Chest: Effort normal. No respiratory distress. She has wheezes. She exhibits no tenderness.  There are bilateral rhonchi present.  Abdominal: Soft. Bowel sounds are normal. She exhibits no distension. There is no tenderness.  Musculoskeletal: Normal range of motion. She exhibits no edema or tenderness.  Neurological: She is alert and oriented to person, place, and time.  Skin: Skin is warm and dry. She is not diaphoretic.  Nursing note and vitals reviewed.   ED Course  Procedures (including critical care time) Labs Review Labs Reviewed  BASIC METABOLIC PANEL  BRAIN NATRIURETIC PEPTIDE  TROPONIN I  CBC WITH DIFFERENTIAL/PLATELET    Imaging Review Dg Chest 2 View  07/19/2015  CLINICAL DATA:  Shortness of breath, chronic asthma, diabetes, previous smoker. EXAM: CHEST  2 VIEW COMPARISON:  PA and lateral chest x-ray of July 11, 2015 FINDINGS: The lungs are adequately inflated. There is no focal infiltrate. Mild interstitial prominence is present in the lower lobes but is stable. There is no pleural effusion. The heart and pulmonary vascularity are normal. The mediastinum is normal in width. The bony thorax  exhibits no acute abnormality. IMPRESSION: There is no evidence of pneumonia nor other acute cardiopulmonary abnormalities. Chronic changes are present consistent with reactive airway disease. Electronically Signed   By: David  [place] M.D.   On: 07/19/2015 14:06   I have personally reviewed and evaluated these images and lab results as part of my medical decision-making.  ED ECG REPORT   Date: 07/19/2015  Rate: 78  Rhythm: normal sinus rhythm  QRS Axis: normal  Intervals: normal  ST/T Wave abnormalities: normal  Conduction Disutrbances:none  Narrative Interpretation:   Old EKG Reviewed: unchanged  I have personally reviewed the EKG tracing and agree with the computerized printout as noted.    MDM   Final diagnoses:  None    Workup reveals negative chest xray, but persistent productive cough, wheezing, and slight hypoxia.  She was sent by her primary Dr. for admission. I've discussed this with Dr. Elisabeth Pigeon who agrees to admit. She was given albuterol and steroids. She was also given antibiotics to cover for community-acquired pneumonia.    Geoffery Lyons, MD 07/19/15 [phone]

## 2015-07-19 NOTE — Progress Notes (Signed)
PHARMACY NOTE -  Zithromax/Rocephin  Pharmacy has been assisting with dosing of Zithromax/Rocephin for PNA. Dose of these abx's will remain stable at current doses and need for further dosage adjustment appears unlikely at present.    Will sign off at this time.  Please reconsult if a change in clinical status warrants re-evaluation of dosage.  Hessie KnowsJustin M Donell Tomkins, PharmD, BCPS Pager [phone] 07/19/2015 7:09 PM

## 2015-07-19 NOTE — H&P (Signed)
[facility] History and Physical  Alicia Lucero NUU:[id number] DOB: May 16, 1961 DOA: 07/19/2015  Referring physician: ER physician: Dr. Geoffery Lyons PCP: [name], MD  Chief Complaint: fever, shortness of breath, cough  HPI:  54 year old female with past medical history of morbid obesity, insulin-dependent diabetes, hypertension, dyslipidemia, bipolar disorder who presented to [facility] ED for evaluation of respiratory tract infection. She has been seen in UC today and few days prior . SHe was given Zithromax for cough and possible early pneumonia. She has however not improved since then and continue to feel feverish, have chills. She also has cough productive of whitish sputum. She also reported audible wheezing. No chest pain or palpitations. No abdominal pain, nausea or vomiting. No lightheadedness.  In ED, her O2 sat was 91% on [name] oxygen support. Her blood work showed WBC count of 12.3 otherwise unremarkable. CXR showed no acute cardiopulmonary process. She was admitted for evaluation and treatment of pneumonia suspected clinical   Assessment & Plan    Principal Problem:   Acute respiratory failure with hypoxia (HCC) / Lobar pneumonia, unspecified organism (HCC) / Leukocytosis - Pt saturating 91% on [place] oxygen support - Hypoxia likely from pneumonia as well as asthma excarebation - CXR showed no acute cardiopulmonary process however please note she was already on abx on outpt basis - Pneumonia order set placed - Follow up strep pneumonia, legionella, resp culture, blood cultures, HIV panel - Started azithromycin and rocephin - Continue oxygen support via [place] to keep O2 sats above 90%  Active Problems:   Asthma with acute exacerbation - Has received solumedrol 125 mg in ED - Will start prednisone 50 mg daily from tomorrow - Added duoneb as needed for shortness of breath or wheezing    Morbid obesity with BMI of 50.0-59.9, adult (HCC) - Counseled on diet    Depression -  Continue Zoloft    Bipolar 1 disorder (HCC) - Continue Lamictal, Tegretol - Stable    Yeast infection, groin - Started diflucan and nystatin powder    Type 2 diabetes mellitus with diabetic neuropathy, with long-term current use of insulin (HCC) - Resume levemir, victoza, metformin - Add sliding scale insulin  - Continue gabapentin for neuropathy     Dyslipidemia associated with type 2 diabetes mellitus (HCC) - Continue omega 3 supplementation    Benign essential HTN - Continue lisinopril  DVT prophylaxis:  - SCD's bilaterally   Radiological Exams on Admission: Dg Chest 2 View 07/19/2015  There is no evidence of pneumonia nor other acute cardiopulmonary abnormalities. Chronic changes are present consistent with reactive airway disease. Electronically Signed   By: David  [place] M.D.   On: 07/19/2015 14:06    EKG: I have personally reviewed EKG. EKG shows sinus rhythm  Code Status: Full Family Communication: Plan of care discussed with the patient  Disposition Plan: Admit for further evaluation, telemetry   [name], Aniceto Boss, MD  [facility] Pager [phone]  Time spent in minutes: 75 minutes  Review of Systems:  Constitutional: positive for fever, chills and malaise/fatigue. Negative for diaphoresis.  HENT: Negative for hearing loss, ear pain, nosebleeds, congestion, sore throat, neck pain, tinnitus and ear discharge.   Eyes: Negative for blurred vision, double vision, photophobia, pain, discharge and redness.  Respiratory: per HPI.   Cardiovascular: Negative for chest pain, palpitations, orthopnea, claudication and leg swelling.  Gastrointestinal: Negative for nausea, vomiting and abdominal pain. Negative for heartburn, constipation, blood in stool and melena.  Genitourinary: Negative for dysuria, urgency, frequency, hematuria and flank pain.  Musculoskeletal: Negative for myalgias, back pain, joint pain and falls.  Skin: Negative for itching and rash.  Neurological:  Negative for dizziness and weakness. Negative for tingling, tremors, sensory change, speech change, focal weakness, loss of consciousness and headaches.  Endo/Heme/Allergies: Negative for environmental allergies and polydipsia. Does not bruise/bleed easily.  Psychiatric/Behavioral: Negative for suicidal ideas. The patient is not nervous/anxious.      Past Medical History  Diagnosis Date  . Depression   . GERD (gastroesophageal reflux disease)   . Anxiety   . 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 Bipolar 1 disorder (HCC)   . PCOS (polycystic ovarian syndrome)    Past Surgical History  Procedure Laterality Date  . Cholecystectomy  1996  . Tonsillectomy      as a child  . Knee arthroscopy Left 2008  . Dilation and curettage of uterus      secondary to menorrhagia  . Wisdom tooth extraction     Social History:  reports that she quit smoking about 10 years ago. Her smoking use included Cigarettes. She has never used smokeless tobacco. She reports that she does not drink alcohol or use illicit drugs.  Allergies  Allergen Reactions  . Empagliflozin Other (See Comments)    Causes yeast infection  . Empagliflozin-Linagliptin Other (See Comments)    Causes yeast infection  . Hydrocodone Other (See Comments)    Cough Syrup. Headache and sleepiness.   . Other Nausea Only    States she can't tolerate seafood smell    Family History:  Family History  Problem Relation Age of Onset  . Cancer Mother     lung  . Hypertension Mother   . Cancer Father     liver  . Hypertension Father   . Heart disease Father     CVD  . Multiple sclerosis Sister   . Hypertension Sister      Prior to Admission medications   Medication Sig Start Date End Date Taking? Authorizing Provider  albuterol (PROVENTIL HFA;VENTOLIN HFA) 108 (90 BASE) MCG/ACT inhaler Inhale 2 puffs into the lungs every 6 (six) hours as needed for wheezing or shortness of breath. 10/08/14   Yes [name], MD  B Complex-C (B-COMPLEX WITH VITAMIN C) tablet Take 1 tablet by mouth daily.   Yes Historical Provider, MD  carbamazepine (TEGRETOL) 200 MG tablet Take 1.5 tablets (300 mg total) by mouth 3 (three) times daily. Patient taking differently: Take 200 mg by mouth 3 (three) times daily.  12/21/14  Yes [name], MD  clonazePAM (KLONOPIN) 2 MG tablet TAKE 1 TABLET BY MOUTH 3 TIMES DAILY AS NEEDED 07/11/15  Yes [name], MD  gabapentin (NEURONTIN) 300 MG capsule TAKE 2 CAPSULES BY MOUTH THREE TIMES A DAY 07/11/15  Yes [name], MD  Insulin Detemir (LEVEMIR FLEXTOUCH) 100 UNIT/ML Pen Inject 40 Units into the skin daily. 06/08/15  Yes [name], MD  lamoTRIgine (LAMICTAL) 200 MG tablet Take 2 tablets (400 mg total) by mouth every evening. 07/11/15  Yes [name], MD  levofloxacin (LEVAQUIN) 500 MG tablet Take 1 tablet (500 mg total) by mouth daily. 07/11/15  Yes [name], MD  Liraglutide (VICTOZA) 18 MG/3ML SOPN Inject 1.8 mg into the skin daily.    Yes Historical Provider, MD  lisinopril (PRINIVIL,ZESTRIL) 5 MG tablet Take 0.5 tablets (2.5 mg total) by mouth daily. 03/01/15  Yes Todd McVeigh, PA  metFORMIN (GLUCOPHAGE) 500 MG tablet Take 500 mg by mouth 2 (  two) times daily with a meal.    Yes Historical Provider, MD  Multiple Vitamins-Minerals (WOMENS MULTIVITAMIN PLUS PO) Take 1 tablet by mouth daily.    Yes Historical Provider, MD  naproxen sodium (ANAPROX) 220 MG tablet Take 220 mg by mouth 2 (two) times daily as needed (for pain).   Yes Historical Provider, MD  Omega-3 Fatty Acids (FISH OIL PO) Take 2 capsules by mouth 2 (two) times daily.   Yes Historical Provider, MD  ranitidine (ZANTAC) 150 MG capsule Take 150 mg by mouth 2 (two) times daily.   Yes Historical Provider, MD  sertraline (ZOLOFT) 100 MG tablet Take 2 tablets (200 mg total) by mouth daily. 07/11/15  Yes [name], MD  traZODone (DESYREL) 100 MG tablet Take 1 tablet (100 mg  total) by mouth at bedtime. Patient taking differently: Take 100 mg by mouth at bedtime as needed for sleep.  12/21/14  Yes [name], MD  ketoconazole (NIZORAL) 2 % cream Apply 1 application topically 2 (two) times daily. Patient taking differently: Apply 1 application topically 2 (two) times daily as needed for irritation.  07/19/15   [name], MD  mupirocin ointment (BACTROBAN) 2 % APPLY TO AFFECTED AREAS TWICE DAILY 09/22/13   [name], MD  zoster vaccine live, PF, (ZOSTAVAX) 54098 UNT/0.65ML injection Inject 19,400 Units into the skin once. Patient not taking: Reported on 07/19/2015 12/21/14   [name], MD   Physical Exam: Filed Vitals:   07/19/15 1500 07/19/15 1630 07/19/15 1745 07/19/15 1754  BP: 124/75 129/74  108/65  Pulse: 79 80 82 83  Temp: 97.7 F (36.5 C)   98 F (36.7 C)  TempSrc:    Oral  Resp: 20   18  Height: 5\' 4"  (1.626 m)     Weight: 146.058 kg (322 lb)     SpO2: 95% 93% 95% 92%    Physical Exam  Constitutional: Appears morbidly obese. No distress.  HENT: Normocephalic. No tonsillar erythema or exudates Eyes: Conjunctivae are normal. No scleral icterus.  Neck: Normal ROM. Neck supple. No JVD. No tracheal deviation. No thyromegaly.  CVS: RRR, S1/S2 +, no murmurs, no gallops, no carotid bruit.  Pulmonary: wheezing in upper and mid lung lobes, no crackles.  Abdominal: Soft. BS +,  no distension, tenderness, rebound or guarding.  Musculoskeletal: Normal range of motion. No edema and no tenderness.  Lymphadenopathy: No lymphadenopathy noted, cervical, inguinal. Neuro: Alert. Normal reflexes, muscle tone coordination. No focal neurologic deficits. Skin: Skin is warm and dry. No rash noted.  No erythema. No pallor.  Psychiatric: Normal mood and affect. Behavior, judgment, thought content normal.   Labs on Admission:  Basic Metabolic Panel:  Recent Labs Lab 07/19/15 1653  NA 138  K 3.8  CL 103  CO2 26  GLUCOSE 149*  BUN 11   CREATININE 0.68  CALCIUM 9.3   Liver Function Tests: No results for input(s): AST, ALT, ALKPHOS, BILITOT, PROT, ALBUMIN in the last 168 hours. No results for input(s): LIPASE, AMYLASE in the last 168 hours. No results for input(s): AMMONIA in the last 168 hours. CBC:  Recent Labs Lab 07/19/15 1348 07/19/15 1653  WBC 12.3* 11.5*  NEUTROABS  --  6.9  HGB 12.9 12.8  HCT 37.0* 38.7  MCV 86.7 90.2  PLT  --  318   Cardiac Enzymes:  Recent Labs Lab 07/19/15 1653  TROPONINI <0.03   BNP: Invalid input(s): POCBNP CBG: No results for input(s): GLUCAP in the last 168 hours.  If 7PM-7AM, please  contact night-coverage [email or web address] Password [email or web address] 07/19/2015, 7:00 PM

## 2015-07-19 NOTE — Progress Notes (Signed)
Received pt from ED, telemetry applied, VSS, oriented to unit, call light placed within reach

## 2015-07-19 NOTE — ED Notes (Signed)
Pt with cough/shortness of breath/fever x 3 weeks. Has been on antibiotics.  Pt went to MD today and told she has double pneumonia and to come to ED for admission.

## 2015-07-19 NOTE — Progress Notes (Signed)
This chart was scribed for Alicia Sidle, MD by Stann Ore, medical scribe at [facility].The patient was seen in exam room 6 and the patient's care was started at 1:21 PM.  Patient ID: Alicia Lucero MRN: [id number], DOB: February 24, 1961, 54 y.o. Date of Encounter: 07/19/2015  Primary Physician: Alicia Sidle, MD  Chief Complaint:  Chief Complaint  Patient presents with  . Follow-up    Pneumonia, SOB    HPI:  Alicia Lucero is a 54 y.o. female who presents to [facility] for follow up on pneumonia and SOB. She's freezing but also sweating. She's not feeling any better. She's not being able to get good rest.   She has to take care of her sister as well.   Past Medical History  Diagnosis Date  . Depression   . GERD (gastroesophageal reflux disease)   . Anxiety   . 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 Bipolar 1 disorder (HCC)   . PCOS (polycystic ovarian syndrome)      Home Meds: Prior to Admission medications   Medication Sig Start Date End Date Taking? Authorizing Provider  ACCU-CHEK AVIVA PLUS test strip USE AS DIRECTED 05/07/14   Alicia Sidle, MD  ACCU-CHEK SOFTCLIX LANCETS lancets USE AS DIRECTED    Nelva Nay, PA-C  albuterol (PROVENTIL HFA;VENTOLIN HFA) 108 (90 BASE) MCG/ACT inhaler Inhale 2 puffs into the lungs every 6 (six) hours as needed for wheezing or shortness of breath. 10/08/14   Alicia Sidle, MD  albuterol (PROVENTIL HFA;VENTOLIN HFA) 108 (90 BASE) MCG/ACT inhaler Inhale 2 puffs into the lungs every 4 (four) hours as needed for wheezing or shortness of breath (cough, shortness of breath or wheezing.). 07/04/15   Alicia Sidle, MD  aspirin EC 81 MG EC tablet Take 1 tablet (81 mg total) by mouth daily. 12/11/12   Penny Pia, MD  Aspirin-Acetaminophen-Caffeine (EXCEDRIN PO) Take by mouth as needed.    Historical Provider, MD  B Complex-C (B-COMPLEX WITH VITAMIN C) tablet Take 1  tablet by mouth daily.    Historical Provider, MD  carbamazepine (TEGRETOL) 200 MG tablet Take 1.5 tablets (300 mg total) by mouth 3 (three) times daily. 12/21/14   Alicia Sidle, MD  clonazePAM (KLONOPIN) 2 MG tablet TAKE 1 TABLET BY MOUTH 3 TIMES DAILY AS NEEDED 07/11/15   Alicia Sidle, MD  fluconazole (DIFLUCAN) 150 MG tablet Take 1 tablet (150 mg total) by mouth once. Repeat if needed 08/24/14   Alicia Sidle, MD  gabapentin (NEURONTIN) 300 MG capsule TAKE 2 CAPSULES BY MOUTH THREE TIMES A DAY 07/11/15   Alicia Sidle, MD  Insulin Detemir (LEVEMIR FLEXTOUCH) 100 UNIT/ML Pen Inject 40 Units into the skin daily. 06/08/15   Alicia Sidle, MD  Insulin Pen Needle 31G X 8 MM MISC 1 each by Does not apply route daily. 10/09/14   Chelle Jeffery, PA-C  lamoTRIgine (LAMICTAL) 200 MG tablet Take 2 tablets (400 mg total) by mouth every evening. 07/11/15   Alicia Sidle, MD  levofloxacin (LEVAQUIN) 500 MG tablet Take 1 tablet (500 mg total) by mouth daily. 07/11/15   Alicia Sidle, MD  Liraglutide (VICTOZA) 18 MG/3ML SOPN Inject into the skin.    Historical Provider, MD  lisinopril (PRINIVIL,ZESTRIL) 5 MG tablet Take 0.5 tablets (2.5 mg total) by mouth daily. 03/01/15   Raelyn Ensign, PA  metFORMIN (GLUCOPHAGE) 500 MG tablet Take 500 mg by mouth 2 (two) times daily with a meal. 2  tablets bid    Historical Provider, MD  Multiple Vitamins-Minerals (WOMENS MULTIVITAMIN PLUS PO) Take 1 tablet by mouth daily.     Historical Provider, MD  mupirocin ointment (BACTROBAN) 2 % APPLY TO AFFECTED AREAS TWICE DAILY 09/22/13   Alicia Sidle, MD  naproxen sodium (ANAPROX) 220 MG tablet Take 220 mg by mouth 2 (two) times daily as needed (for pain).    Historical Provider, MD  Omega-3 Fatty Acids (FISH OIL PO) Take 2 capsules by mouth 2 (two) times daily.    Historical Provider, MD  ranitidine (ZANTAC) 150 MG capsule Take 150 mg by mouth 2 (two) times daily.    Historical Provider, MD  sertraline (ZOLOFT) 100 MG tablet  Take 2 tablets (200 mg total) by mouth daily. 07/11/15   Alicia Sidle, MD  traZODone (DESYREL) 100 MG tablet Take 1 tablet (100 mg total) by mouth at bedtime. 12/21/14   Alicia Sidle, MD  zoster vaccine live, PF, (ZOSTAVAX) 16010 UNT/0.65ML injection Inject 19,400 Units into the skin once. 12/21/14   Alicia Sidle, MD    Allergies:  Allergies  Allergen Reactions  . Empagliflozin Other (See Comments)    Causes yeast infection  . Empagliflozin-Linagliptin Other (See Comments)    Causes yeast infection  . Other Nausea Only    States she can't tolerate seafood smell    Social History   Social History  . Marital Status: Married    Spouse Name: [name]  . Number of Children: 0  . Years of Education: Assoc.   Occupational History  .  [employer]   Social History Main Topics  . Smoking status: Former Smoker    Types: Cigarettes    Quit date: 06/30/2005  . Smokeless tobacco: Never Used     Comment: electronic cigarettes PRN  . Alcohol Use: No     Comment: quit: 2007  . Drug Use: No     Comment: occasional  . Sexual Activity: Yes   Other Topics Concern  . Not on file   Social History Narrative   Patient lives at home with her spouse.   Caffeine Use: 5-7 caffeine drinks daily     Review of Systems: Constitutional: negative for fever, weight changes; positive for fatigue, chills, sweats HEENT: negative for vision changes, hearing loss, congestion, rhinorrhea, ST, epistaxis, or sinus pressure Cardiovascular: negative for chest pain or palpitations Respiratory: negative for hemoptysis; positive for cough, wheezing, shortness of breath, dyspnea Abdominal: negative for abdominal pain, nausea, vomiting, diarrhea, or constipation Dermatological: negative for rash Neurologic: negative for headache, dizziness, or syncope All other systems reviewed and are otherwise negative with the exception to those above and in the HPI.  Physical Exam: Blood pressure 145/87, pulse 88,  temperature 97.6 F (36.4 C), temperature source Oral, resp. rate 20, height 5\' 4"  (1.626 m), last menstrual period 07/04/2015, SpO2 97 %., There is no weight on file to calculate BMI. General: Well developed, well nourished, in no acute distress. Head: Normocephalic, atraumatic, eyes without discharge, sclera non-icteric, nares are without discharge. Bilateral auditory canals clear, TM's are without perforation, pearly grey and translucent with reflective cone of light bilaterally. Oral cavity moist, posterior pharynx without exudate, erythema, peritonsillar abscess, or post nasal drip.  Neck: Supple. No thyromegaly. Full ROM. No lymphadenopathy. Lungs: Clear bilaterally to auscultation without rales, or rhonchi. Inhale and expiratory wheezes bilaterally, bilateral inhale rales  Heart: RRR with S1 S2. No murmurs, rubs, or gallops appreciated. Abdomen: Soft, non-tender, non-distended with normoactive bowel sounds. No hepatomegaly. No rebound/guarding.  No obvious abdominal masses. Msk:  Strength and tone normal for age. Extremities/Skin: Warm and dry. No clubbing or cyanosis. No edema. No rashes or suspicious lesions. Neuro: Alert and oriented X 3. Moves all extremities spontaneously. Gait is normal. CNII-XII grossly in tact. Psych:  Responds to questions appropriately with a normal affect.   [facility] reading (PRIMARY) by Dr. Milus Glazier : no other infiltrates seen, rounded density in the left hilar region  Results for orders placed or performed in visit on 07/19/15  POCT CBC  Result Value Ref Range   WBC 12.3 (A) 4.6 - 10.2 K/uL   Lymph, poc 3.7 (A) 0.6 - 3.4   POC LYMPH PERCENT 30.2 10 - 50 %L   MID (cbc) 1.1 (A) 0 - 0.9   POC MID % 8.6 0 - 12 %M   POC Granulocyte 7.5 (A) 2 - 6.9   Granulocyte percent 61.2 37 - 80 %G   RBC 4.27 4.04 - 5.48 M/uL   Hemoglobin 12.9 12.2 - 16.2 g/dL   HCT, POC 40.1 (A) 02.7 - 47.9 %   MCV 86.7 80 - 97 fL   MCH, POC 30.3 27 - 31.2 pg   MCHC 35.0 31.8 - 35.4 g/dL    RDW, POC 25.3 %   Platelet Count, POC 336 142 - 424 K/uL   MPV 7.4 0 - 99.8 fL       ASSESSMENT AND PLAN:  54 y.o. year old female with pneumonia that is clinically not improved and patient is getting fatigued.  I'm recommending that she go to the emergency room to be admitted until her strength improves and her breathing clears. Renato Battles This chart was scribed in my presence and reviewed by me personally.    ICD-9-CM ICD-10-CM   1. Dyspnea 786.09 R06.00 POCT CBC     DG Chest 2 View     albuterol (PROVENTIL) (2.5 MG/3ML) 0.083% nebulizer solution 2.5 mg     ipratropium (ATROVENT) nebulizer solution 0.5 mg  2. Other fatigue 780.79 R53.83 POCT CBC     DG Chest 2 View     albuterol (PROVENTIL) (2.5 MG/3ML) 0.083% nebulizer solution 2.5 mg     ipratropium (ATROVENT) nebulizer solution 0.5 mg  3. Shortness of breath 786.05 R06.02 POCT CBC     DG Chest 2 View     albuterol (PROVENTIL) (2.5 MG/3ML) 0.083% nebulizer solution 2.5 mg     ipratropium (ATROVENT) nebulizer solution 0.5 mg  4. Tinea cruris 110.3 B35.6 ketoconazole (NIZORAL) 2 % cream     Signed, Alicia Sidle, MD    By signing my name below, I, Stann Ore, attest that this documentation has been prepared under the direction and in the presence of Alicia Sidle, MD. Electronically Signed: Stann Ore, Scribe. 07/19/2015 , 1:21 PM .  Signed, Alicia Sidle, MD 07/19/2015 1:21 PM

## 2015-07-20 DIAGNOSIS — F319 Bipolar disorder, unspecified: Secondary | ICD-10-CM | POA: Diagnosis not present

## 2015-07-20 DIAGNOSIS — J9601 Acute respiratory failure with hypoxia: Secondary | ICD-10-CM | POA: Diagnosis not present

## 2015-07-20 DIAGNOSIS — J45901 Unspecified asthma with (acute) exacerbation: Secondary | ICD-10-CM | POA: Diagnosis not present

## 2015-07-20 DIAGNOSIS — I10 Essential (primary) hypertension: Secondary | ICD-10-CM | POA: Diagnosis not present

## 2015-07-20 DIAGNOSIS — J189 Pneumonia, unspecified organism: Secondary | ICD-10-CM | POA: Diagnosis present

## 2015-07-20 LAB — GLUCOSE, CAPILLARY
GLUCOSE-CAPILLARY: 250 mg/dL — AB (ref 65–99)
GLUCOSE-CAPILLARY: 353 mg/dL — AB (ref 65–99)
GLUCOSE-CAPILLARY: 286 mg/dL — AB (ref 65–99)
GLUCOSE-CAPILLARY: 210 mg/dL — AB (ref 65–99)
Glucose-Capillary: 384 mg/dL — ABNORMAL HIGH (ref 65–99)

## 2015-07-20 LAB — COMPREHENSIVE METABOLIC PANEL
ALBUMIN: 3.5 g/dL (ref 3.5–5.0)
ALK PHOS: 82 U/L (ref 38–126)
ALT: 12 U/L — AB (ref 14–54)
ANION GAP: 12 (ref 5–15)
AST: 21 U/L (ref 15–41)
BILIRUBIN TOTAL: 0.5 mg/dL (ref 0.3–1.2)
BUN: 13 mg/dL (ref 6–20)
CO2: 22 mmol/L (ref 22–32)
Calcium: 8.9 mg/dL (ref 8.9–10.3)
Chloride: 101 mmol/L (ref 101–111)
Creatinine, Ser: 0.73 mg/dL (ref 0.44–1.00)
GFR calc Af Amer: >60 mL/min (ref >60)
GFR calc non Af Amer: >60 mL/min (ref >60)
GLUCOSE: 312 mg/dL — AB (ref 65–99)
POTASSIUM: 4.7 mmol/L (ref 3.5–5.1)
Sodium: 135 mmol/L (ref 135–145)
TOTAL PROTEIN: 6.6 g/dL (ref 6.5–8.1)

## 2015-07-20 LAB — CBC
HEMATOCRIT: 37.8 % (ref 36.0–46.0)
HEMOGLOBIN: 12.3 g/dL (ref 12.0–15.0)
MCH: 29.4 pg (ref 26.0–34.0)
MCHC: 32.5 g/dL (ref 30.0–36.0)
MCV: 90.4 fL (ref 78.0–100.0)
Platelets: 300 10*3/uL (ref 150–400)
RBC: 4.18 MIL/uL (ref 3.87–5.11)
RDW: 13.4 % (ref 11.5–15.5)
WBC: 13.3 10*3/uL — ABNORMAL HIGH (ref 4.0–10.5)

## 2015-07-20 LAB — STREP PNEUMONIAE URINARY ANTIGEN: Strep Pneumo Urinary Antigen: NEGATIVE

## 2015-07-20 LAB — HIV ANTIBODY (ROUTINE TESTING W REFLEX): HIV Screen 4th Generation wRfx: NONREACTIVE

## 2015-07-20 MED ORDER — PNEUMOCOCCAL VAC POLYVALENT 25 MCG/0.5ML IJ INJ
0.5000 mL | INJECTION | INTRAMUSCULAR | Status: AC
  Administered 2015-07-21: 0.5 mL via INTRAMUSCULAR
  Filled 2015-07-20 (×2): qty 0.5

## 2015-07-20 MED ORDER — INFLUENZA VAC SPLIT QUAD 0.5 ML IM SUSY
0.5000 mL | PREFILLED_SYRINGE | INTRAMUSCULAR | Status: AC
  Administered 2015-07-21: 0.5 mL via INTRAMUSCULAR
  Filled 2015-07-20 (×2): qty 0.5

## 2015-07-20 MED ORDER — METHYLPREDNISOLONE SODIUM SUCC 125 MG IJ SOLR
60.0000 mg | Freq: Two times a day (BID) | INTRAMUSCULAR | Status: DC
  Administered 2015-07-20: 60 mg via INTRAVENOUS
  Filled 2015-07-20 (×2): qty 0.96

## 2015-07-20 NOTE — Progress Notes (Addendum)
Patient ID: Alicia Lucero, female   DOB: Jul 14, 1961, 54 y.o.   MRN: [id number] [facility] PROGRESS NOTE  Alicia Lucero [id number] DOB: Jul 14, 1961 DOA: 07/19/2015 PCP: Elvina SidleLAUENSTEIN,KURT, MD  Brief narrative:    54 year old female with past medical history of morbid obesity, insulin-dependent diabetes, hypertension, dyslipidemia, bipolar disorder who [facility] for evaluation of respiratory tract infection. She was seen in UC on the day of the admission and since she was not improving on azithromycin. She continued to have fevers, chills and cough productive of whitish sputum in addition to audible wheezing.  In ED, her O2 sat was 91% on [place] oxygen support. Her blood work showed WBC count of 12.3 otherwise unremarkable. CXR showed no acute cardiopulmonary process. She was admitted for treatment of possible clinical pneumonia.  Anticipated discharge: Likely by 07/22/2015.  Assessment/Plan:    Principal Problem:  Acute respiratory failure with hypoxia (HCC) / Lobar pneumonia, unspecified organism (HCC) / Leukocytosis - Hypoxia likely due to combination of possible community-acquired pneumonia as well as asthma exacerbation. - Chest x-ray on the admission did not show acute cardiopulmonary process  - Continue empiric azithromycin and rocephin  - Blood cultures and strep pneumonia negative so far - Legionella is pending    Active Problems:  Asthma with acute exacerbation - Has received solumedrol 125 mg in ED - Continue solumedrol 60 mg IV Q 12 hours - Add duoneb every 6 hours as needed for shortness of breath or wheezing    Morbid obesity with BMI of 50.0-59.9, adult (HCC) - Counseled on diet   Depression - Continue Zoloft - Stable, not depressed   Bipolar 1 disorder (HCC) - Continue Lamictal, Tegretol - Stable   Yeast infection, groin - Started diflucan and nystatin powder. Since Diflucan can alter Tegretol levels we will stop Diflucan and continue nystatin  powder only.   Type 2 diabetes mellitus with diabetic neuropathy, with long-term current use of insulin (HCC) - Continue victoza, metformin, levemir - Appreciate DM coordinator consult and recommendations  - Continue gabapentin for neuropathy    Dyslipidemia associated with type 2 diabetes mellitus (HCC) - Continue omega 3 supplementation   Benign essential HTN - Continue lisinopril - Renal function WNL  DVT prophylaxis:  - SCD's bilaterally   Code Status: Full.  Family Communication:  plan of care discussed with the patient Disposition Plan: Home likely by 07/22/2015.  IV access:  Peripheral IV  Procedures and diagnostic studies:    Dg Chest 2 View 07/19/2015   There is no evidence of pneumonia nor other acute cardiopulmonary abnormalities. Chronic changes are present consistent with reactive airway disease.  Medical Consultants:  None  Other Consultants:  None  IAnti-Infectives:   Azithromycin 07/19/2015 --> Rocephin 07/19/2015 -->    Manson PasseyEVINE, ALMA, MD  [facility] Pager [phone]  Time spent in minutes: 25 minutes  If 7PM-7AM, please contact night-coverage [email or web address] Password [email or web address] 07/20/2015, 9:53 AM   LOS: 1 day    HPI/Subjective: No acute overnight events.  Objective: Filed Vitals:   07/19/15 1754 07/19/15 1918 07/19/15 1937 07/20/15 0510  BP: 108/65 124/69  115/66  Pulse: 83 88  78  Temp: 98 F (36.7 C) 97.9 F (36.6 C)  98.2 F (36.8 C)  TempSrc: Oral Oral  Oral  Resp: 18 18  20   Height:   5\' 4"  (1.626 m)   Weight:   147.7 kg (325 lb 9.9 oz)   SpO2: 92% 97%  97%    Intake/Output Summary (Last 24 hours) at 07/20/15  1610 Last data filed at 07/20/15 0800  Gross per 24 hour  Intake    240 ml  Output    200 ml  Net     40 ml    Exam:   General:  Pt is alert, awake, no acute distress  Cardiovascular: RRR, appreciate S1, S2  Respiratory: wheezing in upper and mid lung lobes, (+) rhonchi  Abdomen: (+) BS, non tender,  obese   Extremities: No LE swelling, palpable pulses   Neuro: Nonfocal  Data Reviewed: Basic Metabolic Panel:  Recent Labs Lab 07/19/15 1653 07/20/15 0533  NA 138 135  K 3.8 4.7  CL 103 101  CO2 26 22  GLUCOSE 149* 312*  BUN 11 13  CREATININE 0.68 0.73  CALCIUM 9.3 8.9   Liver Function Tests:  Recent Labs Lab 07/20/15 0533  AST 21  ALT 12*  ALKPHOS 82  BILITOT 0.5  PROT 6.6  ALBUMIN 3.5   No results for input(s): LIPASE, AMYLASE in the last 168 hours. No results for input(s): AMMONIA in the last 168 hours. CBC:  Recent Labs Lab 07/19/15 1348 07/19/15 1653 07/20/15 0533  WBC 12.3* 11.5* 13.3*  NEUTROABS  --  6.9  --   HGB 12.9 12.8 12.3  HCT 37.0* 38.7 37.8  MCV 86.7 90.2 90.4  PLT  --  318 300   Cardiac Enzymes:  Recent Labs Lab 07/19/15 1653  TROPONINI <0.03   BNP: Invalid input(s): POCBNP CBG:  Recent Labs Lab 07/19/15 2030 07/19/15 2256 07/20/15 0256 07/20/15 0726  GLUCAP 229* 422* 384* 250*    No results found for this or any previous visit (from the past 240 hour(s)).   Scheduled Meds: . azithromycin  500 mg Intravenous Q24H  . B-complex with vitamin   1 tablet Oral Daily  . carbamazepine  200 mg Oral TID  . cefTRIAXone   1 g Intravenous Q24H  . famotidine  20 mg Oral Daily  . gabapentin  300 mg Oral TID  . insulin aspart  0-20 Units Subcutaneous TID WC  . insulin aspart  0-5 Units Subcutaneous QHS  . insulin detemir  40 Units Subcutaneous Daily  . lamoTRIgine  400 mg Oral QPM  . Liraglutide  1.8 mg Subcutaneous Daily  . lisinopril  2.5 mg Oral Daily  . metFORMIN  500 mg Oral BID WC  . methylPREDNISolone   60 mg Intravenous Q12H  . multivitamin    Oral Daily  . nystatin   Topical BID  . omega-3 acid ethyl   1 g Oral Daily  . sertraline  200 mg Oral Daily  . zolpidem  5 mg Oral QHS   Continuous Infusions: . sodium chloride 10 mL/hr at 07/19/15 2029        .

## 2015-07-20 NOTE — Progress Notes (Signed)
Patient's husband brought in her home medication, Victoza, medication taken to pharmacy and copy placed in chart

## 2015-07-20 NOTE — Progress Notes (Signed)
Inpatient Diabetes Program Recommendations  AACE/ADA: New Consensus Statement on Inpatient Glycemic Control (2015)  Target Ranges:  Prepandial:   less than 140 mg/dL      Peak postprandial:   less than 180 mg/dL (1-2 hours)      Critically ill patients:  140 - 180 mg/dL    Results for Nicolasa DuckingWILKINS, Sharrell (MRN [id number]) as of 07/20/2015 13:57  Ref. Range 07/19/2015 20:30 07/19/2015 22:56 07/20/2015 02:56 07/20/2015 07:26 07/20/2015 11:52  Glucose-Capillary Latest Ref Range: 65-99 mg/dL 782229 (H) 956422 (H) 213384 (H) 250 (H) 353 (H)    Admit Pneumonia  History: DM, HTN  Home DM Meds: Levemir 40 units daily       Victoza 1.8 mg daily        Metformin 500 mg bid  Current Insulin Orders: Levemir 40 units daily                 Novolog RESISTANT SSI (0-20 units) TID AC + HS      Metformin 500 mg bid      Victoza 1.8 mg daily     -Note patient currently receiving IV Solumedrol 60 mg Q12 hours.  -Significant glucose elevations.     MD- Please consider the following in-hospital insulin adjustments while patient getting IV steroids:  1. Increase Levemir to 50 units daily  2. Add Novolog Meal Coverage- Novolog 6 units tid with meals      --Will follow patient during hospitalization--  Ambrose FinlandJeannine Johnston Jessenia Filippone RN, MSN, CDE Diabetes Coordinator Inpatient Glycemic Control Team Team Pager: [phone] (8a-5p)

## 2015-07-20 NOTE — Care Management Note (Signed)
Case Management Note  Patient Details  Name: Alicia Lucero MRN: [id number] Date of Birth: 01/20/1961  Subjective/Objective:  54 y/o f admitted w/acute resp failure.From home.                  Action/Plan:d/c plan home.   Expected Discharge Date:                  Expected Discharge Plan:  Home/Self Care  In-House Referral:     Discharge planning Services  CM Consult  Post Acute Care Choice:    Choice offered to:     DME Arranged:    DME Agency:     HH Arranged:    HH Agency:     Status of Service:  In process, will continue to follow  Medicare Important Message Given:    Date Medicare IM Given:    Medicare IM give by:    Date Additional Medicare IM Given:    Additional Medicare Important Message give by:     If discussed at Long Length of Stay Meetings, dates discussed:    Additional Comments:  [name], [name], RN 07/20/2015, 3:55 PM

## 2015-07-21 DIAGNOSIS — J9601 Acute respiratory failure with hypoxia: Secondary | ICD-10-CM | POA: Diagnosis not present

## 2015-07-21 DIAGNOSIS — I10 Essential (primary) hypertension: Secondary | ICD-10-CM | POA: Diagnosis not present

## 2015-07-21 DIAGNOSIS — J45901 Unspecified asthma with (acute) exacerbation: Secondary | ICD-10-CM | POA: Diagnosis not present

## 2015-07-21 DIAGNOSIS — F319 Bipolar disorder, unspecified: Secondary | ICD-10-CM | POA: Diagnosis not present

## 2015-07-21 LAB — GLUCOSE, CAPILLARY
GLUCOSE-CAPILLARY: 181 mg/dL — AB (ref 65–99)
Glucose-Capillary: 141 mg/dL — ABNORMAL HIGH (ref 65–99)
Glucose-Capillary: 220 mg/dL — ABNORMAL HIGH (ref 65–99)
Glucose-Capillary: 140 mg/dL — ABNORMAL HIGH (ref 65–99)

## 2015-07-21 MED ORDER — IPRATROPIUM-ALBUTEROL 0.5-2.5 (3) MG/3ML IN SOLN
3.0000 mL | Freq: Four times a day (QID) | RESPIRATORY_TRACT | Status: DC
  Administered 2015-07-21 – 2015-07-23 (×8): 3 mL via RESPIRATORY_TRACT
  Filled 2015-07-21 (×9): qty 3

## 2015-07-21 MED ORDER — BUDESONIDE 0.25 MG/2ML IN SUSP: 0.2500 mg | Freq: Two times a day (BID) | RESPIRATORY_TRACT | Status: DC

## 2015-07-21 MED ORDER — METHYLPREDNISOLONE SODIUM SUCC 125 MG IJ SOLR
60.0000 mg | INTRAMUSCULAR | Status: DC
  Administered 2015-07-21 – 2015-07-22 (×2): 60 mg via INTRAVENOUS
  Filled 2015-07-21 (×3): qty 0.96

## 2015-07-21 MED ORDER — ARFORMOTEROL TARTRATE 15 MCG/2ML IN NEBU: 15.0000 ug | INHALATION_SOLUTION | Freq: Two times a day (BID) | RESPIRATORY_TRACT | Status: DC

## 2015-07-21 MED ORDER — INSULIN DETEMIR 100 UNIT/ML ~~LOC~~ SOLN
50.0000 [IU] | Freq: Every day | SUBCUTANEOUS | Status: DC
  Administered 2015-07-21 – 2015-07-22 (×2): 50 [IU] via SUBCUTANEOUS
  Filled 2015-07-21 (×2): qty 0.5

## 2015-07-21 MED ORDER — INSULIN ASPART 100 UNIT/ML ~~LOC~~ SOLN
6.0000 [IU] | Freq: Three times a day (TID) | SUBCUTANEOUS | Status: DC
Start: 2015-07-21 — End: 2015-07-23
  Administered 2015-07-21 – 2015-07-23 (×6): 6 [IU] via SUBCUTANEOUS

## 2015-07-21 NOTE — Progress Notes (Addendum)
Patient ID: Alicia Lucero, female   DOB: [birth year], 54 y.o.   MRN: [id number] [facility] PROGRESS NOTE  Yalissa Fink [id number] DOB: 01-12-1961 DOA: 07/19/2015 PCP: [name], MD  Brief narrative:    54 year old female with past medical history of morbid obesity, insulin-dependent diabetes, hypertension, dyslipidemia, bipolar disorder who presented to [facility] ED for evaluation of respiratory tract infection. She was seen in UC on the day of the admission and since she was not improving on azithromycin. She continued to have fevers, chills and cough productive of whitish sputum in addition to audible wheezing.  In ED, her O2 sat was 91% on [place] oxygen support. Her blood work showed WBC count of 12.3 otherwise unremarkable. CXR showed no acute cardiopulmonary process. She was admitted for treatment of possible clinical pneumonia.  Anticipated discharge: Likely by 07/22/2015.  Assessment/Plan:    Principal Problem:  Acute respiratory failure with hypoxia (HCC) / Lobar pneumonia, unspecified organism (HCC) / Leukocytosis - Patient with oxygen saturation of 91% with 2 L nasal cannula oxygen support on admission. - Secondary to combination of possible community-acquired pneumonia as well as asthma exacerbation. - Chest x-ray on the admission did not demonstrate acute cardiopulmonary process  - She was started on empiric azithromycin and rocephin  - Blood cultures and strep pneumonia negative, legionella is pending    Active Problems:  Asthma with acute exacerbation - Has received solumedrol 125 mg in ED - We then started solumedrol 60 mg IV Q 12 hours. Will change to once a day regimen today. - Added duoneb scheduled every 6 hours in addition to as needed regimen    Morbid obesity with BMI of 50.0-59.9, adult (HCC) - Counseled on diet   Depression - Continue Zoloft - Stable   Bipolar 1 disorder (HCC) - Continue Lamictal, Tegretol - Stable   Yeast infection, groin -  Continue diflucan and nystatin powder.    Type 2 diabetes mellitus with diabetic neuropathy, with long-term current use of insulin (HCC) - Continue victoza, metformin - She was seen by diabetic coordinator and since she is on steroids will increase Levemir to 50 units a day in addition to adding novolog 6 units TID and SSI - Continue gabapentin for neuropathy    Dyslipidemia associated with type 2 diabetes mellitus (HCC) - Continue omega 3 supplementation   Benign essential HTN - Continue lisinopril - Renal function remains normal limits  DVT prophylaxis:  - SCD's bilaterally ordered    Code Status: Full.  Family Communication:  plan of care discussed with the patient Disposition Plan: Home likely by 07/22/2015.  IV access:  Peripheral IV  Procedures and diagnostic studies:    Dg Chest 2 View 07/19/2015   There is no evidence of pneumonia nor other acute cardiopulmonary abnormalities. Chronic changes are present consistent with reactive airway disease.  Medical Consultants:  None  Other Consultants:  None  IAnti-Infectives:   Azithromycin 07/19/2015 --> Rocephin 07/19/2015 -->     Manson Passey, MD  [facility] Pager [phone]  Time spent in minutes: 25 minutes  If 7PM-7AM, please contact night-coverage [email or web address] Password [email or web address] 07/21/2015, 9:20 AM   LOS: 2 days    HPI/Subjective: No acute overnight events. Patient reports shortness of breath early this am.   Objective: Filed Vitals:   07/21/15 0050 07/21/15 0112 07/21/15 0427 07/21/15 0853  BP: 117/62  134/74   Pulse: 76  79   Temp:   98.1 F (36.7 C)   TempSrc:   Oral   Resp: 16  20  Height:      Weight:   150 kg (330 lb 11 oz)   SpO2: 95% 96% 93% 93%    Intake/Output Summary (Last 24 hours) at 07/21/15 0920 Last data filed at 07/21/15 0858  Gross per 24 hour  Intake 1645.16 ml  Output   1630 ml  Net  15.16 ml    Exam:   General:  Pt is alert, follows commands  appropriately, not in acute distress  Cardiovascular: Regular rate and rhythm, S1/S2, no murmurs  Respiratory: Wheezing in upper lung lobes, no rhonchi   Abdomen: Soft, non tender, non distended, bowel sounds present  Extremities: No edema, pulses DP and PT palpable bilaterally  Neuro: Grossly nonfocal  Data Reviewed: Basic Metabolic Panel:  Recent Labs Lab 07/19/15 1653 07/20/15 0533  NA 138 135  K 3.8 4.7  CL 103 101  CO2 26 22  GLUCOSE 149* 312*  BUN 11 13  CREATININE 0.68 0.73  CALCIUM 9.3 8.9   Liver Function Tests:  Recent Labs Lab 07/20/15 0533  AST 21  ALT 12*  ALKPHOS 82  BILITOT 0.5  PROT 6.6  ALBUMIN 3.5   No results for input(s): LIPASE, AMYLASE in the last 168 hours. No results for input(s): AMMONIA in the last 168 hours. CBC:  Recent Labs Lab 07/19/15 1348 07/19/15 1653 07/20/15 0533  WBC 12.3* 11.5* 13.3*  NEUTROABS  --  6.9  --   HGB 12.9 12.8 12.3  HCT 37.0* 38.7 37.8  MCV 86.7 90.2 90.4  PLT  --  318 300   Cardiac Enzymes:  Recent Labs Lab 07/19/15 1653  TROPONINI <0.03   BNP: Invalid input(s): POCBNP CBG:  Recent Labs Lab 07/20/15 0726 07/20/15 1152 07/20/15 1655 07/20/15 2206 07/21/15 0748  GLUCAP 250* 353* 286* 210* 141*    Recent Results (from the past 240 hour(s))  Culture, blood (routine x 2) Call MD if unable to obtain prior to antibiotics being given     Status: None (Preliminary result)   Collection Time: 07/19/15  8:00 PM  Result Value Ref Range Status   Specimen Description BLOOD RIGHT ARM  Final   Special Requests BOTTLES DRAWN AEROBIC AND ANAEROBIC  10CC  Final   Culture   Final    NO GROWTH < 24 HOURS Performed at [facility]    Report Status PENDING  Incomplete  Culture, blood (routine x 2) Call MD if unable to obtain prior to antibiotics being given     Status: None (Preliminary result)   Collection Time: 07/19/15  8:08 PM  Result Value Ref Range Status   Specimen Description BLOOD  LEFT ARM  Final   Special Requests BOTTLES DRAWN AEROBIC AND ANAEROBIC  7CC  Final   Culture   Final    NO GROWTH < 24 HOURS Performed at [facility]    Report Status PENDING  Incomplete     Scheduled Meds: . azithromycin  500 mg Intravenous Q24H  . B-complex with vitamin C  1 tablet Oral Daily  . carbamazepine  200 mg Oral TID  . cefTRIAXone (ROCEPHIN)  IV  1 g Intravenous Q24H  . famotidine  20 mg Oral Daily  . gabapentin  300 mg Oral TID  . Influenza vac split quadrivalent PF  0.5 mL Intramuscular Tomorrow-1000  . insulin aspart  0-20 Units Subcutaneous TID WC  . insulin aspart  0-5 Units Subcutaneous QHS  . insulin aspart  6 Units Subcutaneous TID WC  . insulin detemir  50  Units Subcutaneous Daily  . ipratropium-albuterol  3 mL Nebulization Q6H  . lamoTRIgine  400 mg Oral QPM  . Liraglutide  1.8 mg Subcutaneous Daily  . lisinopril  2.5 mg Oral Daily  . metFORMIN  500 mg Oral BID WC  . methylPREDNISolone (SOLU-MEDROL) injection  60 mg Intravenous Q24H  . multivitamin with minerals   Oral Daily  . nystatin   Topical BID  . omega-3 acid ethyl esters  1 g Oral Daily  . pneumococcal 23 valent vaccine  0.5 mL Intramuscular Tomorrow-1000  . sertraline  200 mg Oral Daily  . sodium chloride  3 mL Intravenous Q12H  . zolpidem  5 mg Oral QHS   Continuous Infusions: . sodium chloride 10 mL/hr at 07/19/15 2029

## 2015-07-21 NOTE — Progress Notes (Signed)
Patient had coughing episode and now c/o wheezing. Requesting PRN nebulizer treatment. RT called and notified that pt has scheduled neb treatment for 2000 but would like it given earlier. RT informed me that patient will receive neb treatment asap. Delford FieldGagliano, Donnielle Addison E, RN

## 2015-07-22 DIAGNOSIS — J45901 Unspecified asthma with (acute) exacerbation: Secondary | ICD-10-CM | POA: Diagnosis not present

## 2015-07-22 DIAGNOSIS — J9601 Acute respiratory failure with hypoxia: Secondary | ICD-10-CM | POA: Diagnosis not present

## 2015-07-22 DIAGNOSIS — F319 Bipolar disorder, unspecified: Secondary | ICD-10-CM | POA: Diagnosis not present

## 2015-07-22 DIAGNOSIS — I10 Essential (primary) hypertension: Secondary | ICD-10-CM | POA: Diagnosis not present

## 2015-07-22 LAB — GLUCOSE, CAPILLARY
GLUCOSE-CAPILLARY: 128 mg/dL — AB (ref 65–99)
GLUCOSE-CAPILLARY: 97 mg/dL (ref 65–99)
GLUCOSE-CAPILLARY: 230 mg/dL — AB (ref 65–99)
Glucose-Capillary: 140 mg/dL — ABNORMAL HIGH (ref 65–99)

## 2015-07-22 MED ORDER — AZITHROMYCIN 500 MG PO TABS
500.0000 mg | ORAL_TABLET | Freq: Every day | ORAL | Status: DC
  Administered 2015-07-22: 500 mg via ORAL
  Filled 2015-07-22 (×2): qty 1

## 2015-07-22 NOTE — Progress Notes (Addendum)
Patient ID: Nihira Puello, female   DOB: [birth year], 54 y.o.   MRN: [id number] [facility] PROGRESS NOTE  Rubena Roseman [id number]:[id number] DOB: 05-Feb-1961 DOA: 07/19/2015 PCP: [name], MD  Brief narrative:    54 year old female with past medical history of morbid obesity, insulin-dependent diabetes, hypertension, dyslipidemia, bipolar disorder who presented to [facility] ED for evaluation of respiratory tract infection. She was seen in UC on the day of the admission and since she was not improving on azithromycin. She continued to have fevers, chills and cough productive of whitish sputum in addition to audible wheezing.  In ED, her O2 sat was 91% on [place] oxygen support. Her blood work showed WBC count of 12.3 otherwise unremarkable. CXR showed no acute cardiopulmonary process. She was admitted for treatment of possible clinical pneumonia.  Anticipated discharge: Likely by 07/23/2015.  Assessment/Plan:    Principal Problem:  Acute respiratory failure with hypoxia (HCC) / Lobar pneumonia, unspecified organism (HCC) / Leukocytosis - Patient had oxygen saturation of 91% with 2 L nasal cannula oxygen support on admission. - This is likely secondary to pneumonia and asthma exacerbation. Please note chest x-ray did not show acute cardiopulmonary process but we have started empiric azithromycin and Rocephin for possible clinical pneumonia - Blood cultures and strep pneumonia are negative. Legionella still pending as of 07/22/2015 - Continue current nebulizer treatments  Active Problems:  Asthma with acute exacerbation - Tapering Solu-Medrol. She is currently on once a day regimen of Solu-Medrol. - Continue Solu-Medrol for now and change to prednisone tomorrow prior to discharge. - Continue duo nebs scheduled every 6 hours as well as as needed for shortness of breath or wheezing   Morbid obesity with BMI of 50.0-59.9, adult (HCC) - Counseled on diet   Depression - Continue Zoloft    Bipolar 1 disorder (HCC) - Continue Lamictal, Tegretol   Yeast infection, groin - Continue diflucan and nystatin powder.    Type 2 diabetes mellitus with diabetic neuropathy, with long-term current use of insulin (HCC) - Continue victoza, metformin - Continue Levemir 50 units a day in addition to novolog 6 units TID  - Continue sliding scale insulin - Continue gabapentin for neuropathy    Dyslipidemia associated with type 2 diabetes mellitus (HCC) - Continue omega 3 supplementation   Benign essential HTN - Continue lisinopril  DVT prophylaxis:  - SCD's bilaterally    Code Status: Full.  Family Communication:  plan of care discussed with the patient Disposition Plan: Home likely by 07/23/2015  IV access:  Peripheral IV  Procedures and diagnostic studies:    Dg Chest 2 View 07/19/2015   There is no evidence of pneumonia nor other acute cardiopulmonary abnormalities. Chronic changes are present consistent with reactive airway disease.  Medical Consultants:  None  Other Consultants:  None  IAnti-Infectives:   Azithromycin 07/19/2015 --> Rocephin 07/19/2015 -->     Manson Passey, MD  [facility] Pager [phone]  Time spent in minutes: 25 minutes  If 7PM-7AM, please contact night-coverage [email or web address] Password [email or web address] 07/22/2015, 7:53 AM   LOS: 3 days    HPI/Subjective: No acute overnight events. Patient reports coughing all night.   Objective: Filed Vitals:   07/21/15 1904 07/21/15 2058 07/22/15 0546 07/22/15 0719  BP:  100/50 125/59   Pulse:  77 72   Temp:  97.8 F (36.6 C) 98 F (36.7 C)   TempSrc:  Oral Oral   Resp:  18 18   Height:      Weight:   149 kg (328 lb  7.8 oz)   SpO2: 95% 93% 94% 98%    Intake/Output Summary (Last 24 hours) at 07/22/15 0753 Last data filed at 07/21/15 1300  Gross per 24 hour  Intake    600 ml  Output      0 ml  Net    600 ml    Exam:   General:  Pt is alert, not in acute distress  Cardiovascular:  RRR, (+) S1, S2  Respiratory: Wheezing mild in upper lung lobes, no rhonchi   Abdomen: (+) BS, non tender abdomen, obese   Extremities: No leg swelling, palpable pulses   Neuro: No focal deficits   Data Reviewed: Basic Metabolic Panel:  Recent Labs Lab 07/19/15 1653 07/20/15 0533  NA 138 135  K 3.8 4.7  CL 103 101  CO2 26 22  GLUCOSE 149* 312*  BUN 11 13  CREATININE 0.68 0.73  CALCIUM 9.3 8.9   Liver Function Tests:  Recent Labs Lab 07/20/15 0533  AST 21  ALT 12*  ALKPHOS 82  BILITOT 0.5  PROT 6.6  ALBUMIN 3.5   No results for input(s): LIPASE, AMYLASE in the last 168 hours. No results for input(s): AMMONIA in the last 168 hours. CBC:  Recent Labs Lab 07/19/15 1348 07/19/15 1653 07/20/15 0533  WBC 12.3* 11.5* 13.3*  NEUTROABS  --  6.9  --   HGB 12.9 12.8 12.3  HCT 37.0* 38.7 37.8  MCV 86.7 90.2 90.4  PLT  --  318 300   Cardiac Enzymes:  Recent Labs Lab 07/19/15 1653  TROPONINI <0.03   BNP: Invalid input(s): POCBNP CBG:  Recent Labs Lab 07/21/15 0748 07/21/15 1133 07/21/15 1704 07/21/15 2103 07/22/15 0732  GLUCAP 141* 220* 140* 181* 128*    Recent Results (from the past 240 hour(s))  Culture, blood (routine x 2) Call MD if unable to obtain prior to antibiotics being given     Status: None (Preliminary result)   Collection Time: 07/19/15  8:00 PM  Result Value Ref Range Status   Specimen Description BLOOD RIGHT ARM  Final   Special Requests BOTTLES DRAWN AEROBIC AND ANAEROBIC  10CC  Final   Culture   Final    NO GROWTH 2 DAYS Performed at [facility]    Report Status PENDING  Incomplete  Culture, blood (routine x 2) Call MD if unable to obtain prior to antibiotics being given     Status: None (Preliminary result)   Collection Time: 07/19/15  8:08 PM  Result Value Ref Range Status   Specimen Description BLOOD LEFT ARM  Final   Special Requests BOTTLES DRAWN AEROBIC AND ANAEROBIC  7CC  Final   Culture   Final    NO GROWTH  2 DAYS Performed at [facility]    Report Status PENDING  Incomplete     Scheduled Meds: . azithromycin  500 mg Intravenous Q24H  . B-complex with vitamin C  1 tablet Oral Daily  . carbamazepine  200 mg Oral TID  . cefTRIAXone (ROCEPHIN)  IV  1 g Intravenous Q24H  . famotidine  20 mg Oral Daily  . gabapentin  300 mg Oral TID  . insulin aspart  0-20 Units Subcutaneous TID WC  . insulin aspart  0-5 Units Subcutaneous QHS  . insulin aspart  6 Units Subcutaneous TID WC  . insulin detemir  50 Units Subcutaneous Daily  . ipratropium-albuterol  3 mL Nebulization Q6H  . lamoTRIgine  400 mg Oral QPM  . Liraglutide  1.8 mg  Subcutaneous Daily  . lisinopril  2.5 mg Oral Daily  . metFORMIN  500 mg Oral BID WC  . methylPREDNISolone (SOLU-MEDROL) injection  60 mg Intravenous Q24H  . multivitamin with minerals   Oral Daily  . nystatin   Topical BID  . omega-3 acid ethyl esters  1 g Oral Daily  . sertraline  200 mg Oral Daily  . sodium chloride  3 mL Intravenous Q12H  . zolpidem  5 mg Oral QHS   Continuous Infusions: . sodium chloride 10 mL/hr at 07/19/15 2029

## 2015-07-22 NOTE — Progress Notes (Signed)
PHARMACIST - PHYSICIAN COMMUNICATION DR:   Benjamine Molaevin CONCERNING: Antibiotic IV to Oral Route Change Policy  RECOMMENDATION: This patient is receiving Azithromycin by the intravenous route.  Based on criteria approved by the Pharmacy and Therapeutics Committee, the antibiotic(s) is/are being converted to the equivalent oral dose form(s).   DESCRIPTION: These criteria include:  Patient being treated for a respiratory tract infection, urinary tract infection, cellulitis or clostridium difficile associated diarrhea if on metronidazole  The patient is not neutropenic and does not exhibit a GI malabsorption state  The patient is eating (either orally or via tube) and/or has been taking other orally administered medications for a least 24 hours  The patient is improving clinically and has a Tmax < 100.5  If you have questions about this conversion, please contact the Pharmacy Department  []   [phone]( [phone] )  [facility] []   [phone]( [phone] )  [facility] []   [phone]( [phone] )  [facility] []   [phone] )  [facility] [x]   [phone]( [phone] )  [facility]

## 2015-07-23 DIAGNOSIS — F319 Bipolar disorder, unspecified: Secondary | ICD-10-CM | POA: Diagnosis not present

## 2015-07-23 DIAGNOSIS — I10 Essential (primary) hypertension: Secondary | ICD-10-CM | POA: Diagnosis not present

## 2015-07-23 DIAGNOSIS — J45901 Unspecified asthma with (acute) exacerbation: Secondary | ICD-10-CM | POA: Diagnosis not present

## 2015-07-23 DIAGNOSIS — J9601 Acute respiratory failure with hypoxia: Secondary | ICD-10-CM | POA: Diagnosis not present

## 2015-07-23 LAB — GLUCOSE, CAPILLARY: Glucose-Capillary: 159 mg/dL — ABNORMAL HIGH (ref 65–99)

## 2015-07-23 LAB — LEGIONELLA PNEUMOPHILA SEROGP 1 UR AG: L. PNEUMOPHILA SEROGP 1 UR AG: NEGATIVE

## 2015-07-23 MED ORDER — IPRATROPIUM-ALBUTEROL 0.5-2.5 (3) MG/3ML IN SOLN: 3.0000 mL | RESPIRATORY_TRACT | Status: DC | PRN

## 2015-07-23 MED ORDER — PREDNISONE 5 MG PO TABS: 50.0000 mg | ORAL_TABLET | Freq: Every day | ORAL | Status: DC

## 2015-07-23 MED ORDER — LEVOFLOXACIN 500 MG PO TABS: 500.0000 mg | ORAL_TABLET | Freq: Every day | ORAL | Status: DC

## 2015-07-23 MED ORDER — INSULIN DETEMIR 100 UNIT/ML FLEXPEN: 50.0000 [IU] | PEN_INJECTOR | Freq: Every day | SUBCUTANEOUS | Status: DC

## 2015-07-23 NOTE — Discharge Summary (Signed)
Physician Discharge Summary  Daina Cara ZOX:[id number] DOB: Sep 03, 1961 DOA: 07/19/2015  PCP: [name], MD  Admit date: 07/19/2015 Discharge date: 07/23/2015  Recommendations for Outpatient Follow-up:  1. Take Levaquin for 4 days on discharge 2. Taper down prednisone starting from 50 mg a day, taper down by 5 mg a day down to 0 mg. for ex, today 50 mg, tomorrow 45 mg, then 40 mg the following day and etc... 3. Use DuoNeb every 4-6 hours as needed if inhaler is not providing symptomatic relief for shortness of breath or wheezing.  Discharge Diagnoses:  Principal Problem:   Acute respiratory failure with hypoxia (HCC) Active Problems:   Community acquired pneumonia   Lobar pneumonia, unspecified organism (HCC)   Morbid obesity with BMI of 50.0-59.9, adult (HCC)   Depression   Asthma, chronic   Bipolar 1 disorder (HCC)   Leukocytosis   Yeast infection   Type 2 diabetes mellitus with diabetic neuropathy, with long-term current use of insulin (HCC)   Dyslipidemia associated with type 2 diabetes mellitus (HCC)   Benign essential HTN   Asthma with exacerbation   CAP (community acquired pneumonia)    Discharge Condition: stable   Diet recommendation: as tolerated   History of present illness:  54 year old female with past medical history of morbid obesity, insulin-dependent diabetes, hypertension, dyslipidemia, bipolar disorder who presented to [facility] ED for evaluation of respiratory tract infection. She was seen in UC on the day of the admission and since she was not improving on azithromycin. She continued to have fevers, chills and cough productive of whitish sputum in addition to audible wheezing.  In ED, her O2 sat was 91% on [place] oxygen support. Her blood work showed WBC count of 12.3 otherwise unremarkable. CXR showed no acute cardiopulmonary process. She was admitted for treatment of possible clinical pneumonia.   Hospital Course:    Assessment/Plan:    Principal  Problem:  Acute respiratory failure with hypoxia (HCC) / Lobar pneumonia, unspecified organism (HCC) / Leukocytosis - Patient had oxygen saturation of 91% with 2 L nasal cannula oxygen support on admission. - Likely secondary to pneumonia and asthma exacerbation. Chest x-ray showed no acute cardiopulmonary process - Empiric azithromycin and Rocephin started on the admission which she will continue through today and then 4 days of Levaquin on discharge - Blood cultures and strep pneumonia are negative. Legionella pending, will be followed on outpt basis  - Continue duoenb as needed for shortness of breath or wheezing   Active Problems:  Asthma with acute exacerbation - Pt was on solumedrol - Pt instructed taper down prednisone starting from 50 mg a day, taper down by 5 mg a day down to 0 mg. for ex, today 50 mg, tomorrow 45 mg, then 40 mg the following day and etc... - Continue duo nebs scheduled every 4-6 hours as well as as needed for shortness of breath or wheezing in addition to albuterol inhaler as needed    Morbid obesity with BMI of 50.0-59.9, adult (HCC) - Counseled on diet   Depression - Continue Zoloft - Stable, not depressed    Bipolar 1 disorder (HCC) - Continue Lamictal, Tegretol   Yeast infection, groin - Diflucan and nystatin powder in hospital    Type 2 diabetes mellitus with diabetic neuropathy, with long-term current use of insulin (HCC) - Continue victoza, metformin - Continue Levemir 50 units a day since on steroids. It will be adjusted on outpt basis once steroids completed  - Continue gabapentin for neuropathy  Dyslipidemia associated with type 2 diabetes mellitus (HCC) - Continue omega 3 supplementation   Benign essential HTN - Continue lisinopril  DVT prophylaxis:  - SCD's bilaterally in hospital    Code Status: Full.  Family Communication: plan of care discussed with the patient   IV access:  Peripheral IV  Procedures and  diagnostic studies:   Dg Chest 2 View 07/19/2015 There is no evidence of pneumonia nor other acute cardiopulmonary abnormalities. Chronic changes are present consistent with reactive airway disease.  Medical Consultants:  None  Other Consultants:  None  IAnti-Infectives:   Azithromycin 07/19/2015 --> 07/23/2015 Rocephin 07/19/2015 -->  07/23/2015   Signed:  Manson Passey, MD  [facility] 07/23/2015, 10:44 AM  Pager #: [phone]  Time spent in minutes: more than 30 minutes    Discharge Exam: Filed Vitals:   07/23/15 1015  BP: 114/76  Pulse:   Temp:   Resp:    Filed Vitals:   07/22/15 2145 07/22/15 2338 07/23/15 0700 07/23/15 1015  BP: 113/88 147/68 129/63 114/76  Pulse: 87 90 76   Temp: 98.2 F (36.8 C) 98.2 F (36.8 C) 97.9 F (36.6 C)   TempSrc: Oral Oral Oral   Resp: Height:      Weight:      SpO2: 97% 94% 94%     General: Pt is alert, follows commands appropriately, not in acute distress Cardiovascular: Regular rate and rhythm, S1/S2 +, no murmurs Respiratory: Clear to auscultation bilaterally, no wheezing, no crackles, no rhonchi Abdominal: Soft, non tender, non distended, bowel sounds +, no guarding Extremities: no edema, no cyanosis, pulses palpable bilaterally DP and PT Neuro: Grossly nonfocal  Discharge Instructions  Discharge Instructions    Call MD for:  difficulty breathing, headache or visual disturbances    Complete by:  As directed      Call MD for:  persistant dizziness or light-headedness    Complete by:  As directed      Call MD for:  persistant nausea and vomiting    Complete by:  As directed      Call MD for:  severe uncontrolled pain    Complete by:  As directed      Diet - low sodium heart healthy    Complete by:  As directed      Discharge instructions    Complete by:  As directed   1. Take Levaquin for 4 days on discharge 2. Taper down prednisone starting from 50 mg a day, taper down by 5 mg  a day down to 0 mg. for ex, today 50 mg, tomorrow 45 mg, then 40 mg the following day and etc... 3. Use DuoNeb every 4-6 hours as needed if inhaler is not providing symptomatic relief for shortness of breath or wheezing.     Increase activity slowly    Complete by:  As directed             Medication List    STOP taking these medications        aspirin 81 MG EC tablet     fluconazole 150 MG tablet  Commonly known as:  DIFLUCAN     zoster vaccine live (PF) 19400 UNT/0.65ML injection  Commonly known as:  ZOSTAVAX      TAKE these medications        ACCU-CHEK AVIVA PLUS test strip  Generic drug:  glucose blood  USE AS DIRECTED     ACCU-CHEK SOFTCLIX LANCETS lancets  USE AS  DIRECTED     albuterol 108 (90 BASE) MCG/ACT inhaler  Commonly known as:  PROVENTIL HFA;VENTOLIN HFA  Inhale 2 puffs into the lungs every 6 (six) hours as needed for wheezing or shortness of breath.     B-complex with vitamin C tablet  Take 1 tablet by mouth daily.     carbamazepine 200 MG tablet  Commonly known as:  TEGRETOL  Take 1.5 tablets (300 mg total) by mouth 3 (three) times daily.     clonazePAM 2 MG tablet  Commonly known as:  KLONOPIN  TAKE 1 TABLET BY MOUTH 3 TIMES DAILY AS NEEDED     FISH OIL PO  Take 2 capsules by mouth 2 (two) times daily.     gabapentin 300 MG capsule  Commonly known as:  NEURONTIN  TAKE 2 CAPSULES BY MOUTH THREE TIMES A DAY     Insulin Detemir 100 UNIT/ML Pen  Commonly known as:  LEVEMIR FLEXTOUCH  Inject 50 Units into the skin daily.     Insulin Pen Needle 31G X 8 MM Misc  1 each by Does not apply route daily.     ipratropium-albuterol 0.5-2.5 (3) MG/3ML Soln  Commonly known as:  DUONEB  Take 3 mLs by nebulization every 4 (four) hours as needed.     ketoconazole 2 % cream  Commonly known as:  NIZORAL  Apply 1 application topically 2 (two) times daily.     lamoTRIgine 200 MG tablet  Commonly known as:  LAMICTAL  Take 2 tablets (400 mg total) by  mouth every evening.     levofloxacin 500 MG tablet  Commonly known as:  LEVAQUIN  Take 1 tablet (500 mg total) by mouth daily.     lisinopril 5 MG tablet  Commonly known as:  PRINIVIL,ZESTRIL  Take 0.5 tablets (2.5 mg total) by mouth daily.     metFORMIN 500 MG tablet  Commonly known as:  GLUCOPHAGE  Take 500 mg by mouth 2 (two) times daily with a meal.     mupirocin ointment 2 %  Commonly known as:  BACTROBAN  APPLY TO AFFECTED AREAS TWICE DAILY     naproxen sodium 220 MG tablet  Commonly known as:  ANAPROX  Take 220 mg by mouth 2 (two) times daily as needed (for pain).     predniSONE 5 MG tablet  Commonly known as:  DELTASONE  Take 10 tablets (50 mg total) by mouth daily with breakfast.     ranitidine 150 MG capsule  Commonly known as:  ZANTAC  Take 150 mg by mouth 2 (two) times daily.     sertraline 100 MG tablet  Commonly known as:  ZOLOFT  Take 2 tablets (200 mg total) by mouth daily.     traZODone 100 MG tablet  Commonly known as:  DESYREL  Take 1 tablet (100 mg total) by mouth at bedtime.     VICTOZA 18 MG/3ML Sopn  Generic drug:  Liraglutide  Inject 1.8 mg into the skin daily.     WOMENS MULTIVITAMIN PLUS PO  Take 1 tablet by mouth daily.           Follow-up Information    Follow up with [name], MD. Schedule an appointment as soon as possible for a visit in 1 week.   Specialty:  Family Medicine   Why:  Follow up appt after recent hospitalization   Contact information:   [address] [place] [address] [phone]        The results of  significant diagnostics from this hospitalization (including imaging, microbiology, ancillary and laboratory) are listed below for reference.    Significant Diagnostic Studies: Dg Chest 2 View  07/19/2015  CLINICAL DATA:  Shortness of breath, chronic asthma, diabetes, previous smoker. EXAM: CHEST  2 VIEW COMPARISON:  PA and lateral chest x-ray of July 11, 2015 FINDINGS: The lungs are  adequately inflated. There is no focal infiltrate. Mild interstitial prominence is present in the lower lobes but is stable. There is no pleural effusion. The heart and pulmonary vascularity are normal. The mediastinum is normal in width. The bony thorax exhibits no acute abnormality. IMPRESSION: There is no evidence of pneumonia nor other acute cardiopulmonary abnormalities. Chronic changes are present consistent with reactive airway disease. Electronically Signed   By: David  [place] M.D.   On: 07/19/2015 14:06   Dg Chest 2 View  07/11/2015  CLINICAL DATA:  Shortness of breath and cough, history of diabetes, asthma, obesity, and previous tobacco use. EXAM: CHEST  2 VIEW COMPARISON:  PA and lateral chest x-ray of April 01, 2010 FINDINGS: The lungs are well-expanded. There is no focal infiltrate. There is slightly increased prominence of the right infrahilar region as compared to the previous study. The heart and pulmonary vascularity are normal. The mediastinum is normal in width. There is no pleural effusion. The bony thorax is unremarkable. IMPRESSION: 1. Right infrahilar prominence more conspicuous today than in the past. This may be due to slight changes in positioning. However, chest CT scanning is recommended to exclude lymphadenopathy or other mass. 2. There is no evidence of pneumonia nor CHF. Electronically Signed   By: David  [place] M.D.   On: 07/11/2015 12:39   Ct Chest Wo Contrast  07/11/2015  CLINICAL DATA:  Shortness of breath and cough EXAM: CT CHEST WITHOUT CONTRAST TECHNIQUE: Multidetector CT imaging of the chest was performed following the standard protocol without IV contrast. COMPARISON:  Chest CT December 12, 2012 FINDINGS: There is tree-in-bud type opacity in the superior segment of the right lower lobe consistent with pneumonia. There is patchy airspace opacity in this area is well. Atelectatic changes noted in the medial segment of the right middle lobe. On axial slice 15 series 5, there is  a stable 3 mm nodular opacity in the posterior segment of the right upper lobe. There is minimal scarring in the anterior left base. Left lung is otherwise clear. There is a nodular lesion in the right lobe of the thyroid measuring 2.8 x 1.9 cm. There is no appreciable thoracic adenopathy. There are subcentimeter mediastinal lymph nodes which do not meet size criteria for pathologic significance. The pericardium is not thickened. There is prominence in the ascending aorta measuring 4.1 x 3.6 cm. The visualized great vessels appear unremarkable on this noncontrast enhanced study. Visualized upper abdominal structures appear unremarkable except for absence of the gallbladder. There are no blastic or lytic bone lesions. IMPRESSION: Tree-in-bud type appearance in the superior segment right lower with patchy airspace opacity consistent with pneumonia in this area. Atelectatic changes noted in the medial segment of the right middle lobe. Left lung is clear except for middle scarring in the anterior base. 3 mm nodular lesion in the posterior segment right upper lobe is stable ; stability over greater than 2 year time span is consistent with benign etiology. There is a mass measuring 2.8 x 1.9 cm arising from the right lobe of the thyroid. Advise correlation with thyroid ultrasound to further evaluate. No appreciable adenopathy. Gallbladder absent. Electronically Signed  By: Bretta BangWilliam  Woodruff III M.D.   On: 07/11/2015 14:36    Microbiology: Recent Results (from the past 240 hour(s))  Culture, blood (routine x 2) Call MD if unable to obtain prior to antibiotics being given     Status: None (Preliminary result)   Collection Time: 07/19/15  8:00 PM  Result Value Ref Range Status   Specimen Description BLOOD RIGHT ARM  Final   Special Requests BOTTLES DRAWN AEROBIC AND ANAEROBIC  10CC  Final   Culture   Final    NO GROWTH 3 DAYS [facility]    Report Status PENDING  Incomplete  Culture,  blood (routine x 2) Call MD if unable to obtain prior to antibiotics being given     Status: None (Preliminary result)   Collection Time: 07/19/15  8:08 PM  Result Value Ref Range Status   Specimen Description BLOOD LEFT ARM  Final   Special Requests BOTTLES DRAWN AEROBIC AND ANAEROBIC  7CC  Final   Culture   Final    NO GROWTH 3 DAYS Performed at [facility]    Report Status PENDING  Incomplete     Labs: Basic Metabolic Panel:  Recent Labs Lab 07/19/15 1653 07/20/15 0533  NA 138 135  K 3.8 4.7  CL 103 101  CO2 26 22  GLUCOSE 149* 312*  BUN 11 13  CREATININE 0.68 0.73  CALCIUM 9.3 8.9   Liver Function Tests:  Recent Labs Lab 07/20/15 0533  AST 21  ALT 12*  ALKPHOS 82  BILITOT 0.5  PROT 6.6  ALBUMIN 3.5   No results for input(s): LIPASE, AMYLASE in the last 168 hours. No results for input(s): AMMONIA in the last 168 hours. CBC:  Recent Labs Lab 07/19/15 1348 07/19/15 1653 07/20/15 0533  WBC 12.3* 11.5* 13.3*  NEUTROABS  --  6.9  --   HGB 12.9 12.8 12.3  HCT 37.0* 38.7 37.8  MCV 86.7 90.2 90.4  PLT  --  318 300   Cardiac Enzymes:  Recent Labs Lab 07/19/15 1653  TROPONINI <0.03   BNP: BNP (last 3 results)  Recent Labs  07/19/15 1653  BNP 24.5    ProBNP (last 3 results) No results for input(s): PROBNP in the last 8760 hours.  CBG:  Recent Labs Lab 07/22/15 0732 07/22/15 1146 07/22/15 1651 07/22/15 2139 07/23/15 0724  GLUCAP 128* 97 140* 230* 159*

## 2015-07-23 NOTE — Progress Notes (Signed)
Received a call that patient to d/c today and needs a nebulizer. Contacted [facility] to arrange, patient anxious to leave, [facility]. Notified [facility] rep of patient request.

## 2015-07-23 NOTE — Progress Notes (Signed)
Discharge instructions discussed with patient until no further questions ask. Patient states she will pick up nebulizer at [facility] store on [address] and has scripts. Case manager aware.

## 2015-07-23 NOTE — Discharge Instructions (Signed)
Asthma, Adult Asthma is a recurring condition in which the airways tighten and narrow. Asthma can make it difficult to breathe. It can cause coughing, wheezing, and shortness of breath. Asthma episodes, also called asthma attacks, range from minor to life-threatening. Asthma cannot be cured, but medicines and lifestyle changes can help control it. CAUSES Asthma is believed to be caused by inherited (genetic) and environmental factors, but its exact cause is unknown. Asthma may be triggered by allergens, lung infections, or irritants in the air. Asthma triggers are different for each person. Common triggers include:   Animal dander.  Dust mites.  Cockroaches.  Pollen from trees or grass.  Mold.  Smoke.  Air pollutants such as dust, household cleaners, hair sprays, aerosol sprays, paint fumes, strong chemicals, or strong odors.  Cold air, weather changes, and winds (which increase molds and pollens in the air).  Strong emotional expressions such as crying or laughing hard.  Stress.  Certain medicines (such as aspirin) or types of drugs (such as beta-blockers).  Sulfites in foods and drinks. Foods and drinks that may contain sulfites include dried fruit, potato chips, and sparkling grape juice.  Infections or inflammatory conditions such as the flu, a cold, or an inflammation of the nasal membranes (rhinitis).  Gastroesophageal reflux disease (GERD).  Exercise or strenuous activity. SYMPTOMS Symptoms may occur immediately after asthma is triggered or many hours later. Symptoms include:  Wheezing.  Excessive nighttime or early morning coughing.  Frequent or severe coughing with a common cold.  Chest tightness.  Shortness of breath. DIAGNOSIS  The diagnosis of asthma is made by a review of your medical history and a physical exam. Tests may also be performed. These may include:  Lung function studies. These tests show how much air you breathe in and out.  Allergy  tests.  Imaging tests such as X-rays. TREATMENT  Asthma cannot be cured, but it can usually be controlled. Treatment involves identifying and avoiding your asthma triggers. It also involves medicines. There are 2 classes of medicine used for asthma treatment:   Controller medicines. These prevent asthma symptoms from occurring. They are usually taken every day.  Reliever or rescue medicines. These quickly relieve asthma symptoms. They are used as needed and provide short-term relief. Your health care provider will help you create an asthma action plan. An asthma action plan is a written plan for managing and treating your asthma attacks. It includes a list of your asthma triggers and how they may be avoided. It also includes information on when medicines should be taken and when their dosage should be changed. An action plan may also involve the use of a device called a peak flow meter. A peak flow meter measures how well the lungs are working. It helps you monitor your condition. HOME CARE INSTRUCTIONS   Take medicines only as directed by your health care provider. Speak with your health care provider if you have questions about how or when to take the medicines.  Use a peak flow meter as directed by your health care provider. Record and keep track of readings.  Understand and use the action plan to help minimize or stop an asthma attack without needing to seek medical care.  Control your home environment in the following ways to help prevent asthma attacks:  Do not smoke. Avoid being exposed to secondhand smoke.  Change your heating and air conditioning filter regularly.  Limit your use of fireplaces and wood stoves.  Get rid of pests (such as roaches  and mice) and their droppings.  Throw away plants if you see mold on them.  Clean your floors and dust regularly. Use unscented cleaning products.  Try to have someone else vacuum for you regularly. Stay out of rooms while they are  being vacuumed and for a short while afterward. If you vacuum, use a dust mask from a hardware store, a double-layered or microfilter vacuum cleaner bag, or a vacuum cleaner with a HEPA filter.  Replace carpet with wood, tile, or vinyl flooring. Carpet can trap dander and dust.  Use allergy-proof pillows, mattress covers, and box spring covers.  Wash bed sheets and blankets every week in hot water and dry them in a dryer.  Use blankets that are made of polyester or cotton.  Clean bathrooms and kitchens with bleach. If possible, have someone repaint the walls in these rooms with mold-resistant paint. Keep out of the rooms that are being cleaned and painted.  Wash hands frequently. SEEK MEDICAL CARE IF:   You have wheezing, shortness of breath, or a cough even if taking medicine to prevent attacks.  The colored mucus you cough up (sputum) is thicker than usual.  Your sputum changes from clear or white to yellow, green, gray, or bloody.  You have any problems that may be related to the medicines you are taking (such as a rash, itching, swelling, or trouble breathing).  You are using a reliever medicine more than 2-3 times per week.  Your peak flow is still at 50-79% of your personal best after following your action plan for 1 hour.  You have a fever. SEEK IMMEDIATE MEDICAL CARE IF:   You seem to be getting worse and are unresponsive to treatment during an asthma attack.  You are short of breath even at rest.  You get short of breath when doing very little physical activity.  You have difficulty eating, drinking, or talking due to asthma symptoms. Levofloxacin tablets What is this medicine? LEVOFLOXACIN (lee voe FLOX a sin) is a quinolone antibiotic. It is used to treat certain kinds of bacterial infections. It will not work for colds, flu, or other viral infections. This medicine may be used for other purposes; ask your health care provider or pharmacist if you have  questions. What should I tell my health care provider before I take this medicine? They need to know if you have any of these conditions: -bone problems -cerebral disease -history of low levels of potassium in the blood -irregular heartbeat -joint problems -kidney disease -myasthenia gravis -seizures -tendon problems -tingling of the fingers or toes, or other nerve disorder -an unusual or allergic reaction to levofloxacin, other quinolone antibiotics, foods, dyes, or preservatives -pregnant or trying to get pregnant -breast-feeding How should I use this medicine? Take this medicine by mouth with a full glass of water. Follow the directions on the prescription label. This medicine can be taken with or without food. Take your medicine at regular intervals. Do not take your medicine more often than directed. Do not skip doses or stop your medicine early even if you feel better. Do not stop taking except on your doctor's advice. A special MedGuide will be given to you by the pharmacist with each prescription and refill. Be sure to read this information carefully each time. Talk to your pediatrician regarding the use of this medicine in children. While this drug may be prescribed for children as young as 6 months for selected conditions, precautions do apply. Overdosage: If you think you have taken  too much of this medicine contact a poison control center or emergency room at once. NOTE: This medicine is only for you. Do not share this medicine with others. What if I miss a dose? If you miss a dose, take it as soon as you remember. If it is almost time for your next dose, take only that dose. Do not take double or extra doses. What may interact with this medicine? Do not take this medicine with any of the following medications: -arsenic trioxide -chloroquine -droperidol -medicines for irregular heart rhythm like amiodarone, disopyramide, dofetilide, flecainide, quinidine, procainamide,  sotalol -some medicines for depression or mental problems like phenothiazines, pimozide, and ziprasidone This medicine may also interact with the following medications: -amoxapine -antacids -birth control pills -cisapride -dairy products -didanosine (ddI) buffered tablets or powder -haloperidol -multivitamins -NSAIDS, medicines for pain and inflammation, like ibuprofen or naproxen -retinoid products like tretinoin or isotretinoin -risperidone -some other antibiotics like clarithromycin or erythromycin -sucralfate -theophylline -warfarin This list may not describe all possible interactions. Give your health care provider a list of all the medicines, herbs, non-prescription drugs, or dietary supplements you use. Also tell them if you smoke, drink alcohol, or use illegal drugs. Some items may interact with your medicine. What should I watch for while using this medicine? Tell your doctor or health care professional if your symptoms do not improve or if they get worse. Drink several glasses of water a day and cut down on drinks that contain caffeine. You must not get dehydrated while taking this medicine. You may get drowsy or dizzy. Do not drive, use machinery, or do anything that needs mental alertness until you know how this medicine affects you. Do not sit or stand up quickly, especially if you are an older patient. This reduces the risk of dizzy or fainting spells. This medicine can make you more sensitive to the sun. Keep out of the sun. If you cannot avoid being in the sun, wear protective clothing and use a sunscreen. Do not use sun lamps or tanning beds/booths. Contact your doctor if you get a sunburn. If you are a diabetic monitor your blood glucose carefully. If you get an unusual reading stop taking this medicine and call your doctor right away. Do not treat diarrhea with over-the-counter products. Contact your doctor if you have diarrhea that lasts more than 2 days or if the diarrhea  is severe and watery. Avoid antacids, calcium, iron, and zinc products for 2 hours before and 2 hours after taking a dose of this medicine. What side effects may I notice from receiving this medicine? Side effects that you should report to your doctor or health care professional as soon as possible: -allergic reactions like skin rash or hives, swelling of the face, lips, or tongue -anxious -confusion -depressed mood -diarrhea -fast, irregular heartbeat -hallucination, loss of contact with reality -joint, muscle, or tendon pain or swelling -pain, tingling, numbness in the hands or feet -suicidal thoughts or other mood changes -sunburn -unusually weak or tired Side effects that usually do not require medical attention (report to your doctor or health care professional if they continue or are bothersome): -dry mouth -headache -nausea -trouble sleeping This list may not describe all possible side effects. Call your doctor for medical advice about side effects. You may report side effects to FDA at 1-800-FDA-1088. Where should I keep my medicine? Keep out of the reach of children. Store at room temperature between 15 and 30 degrees C (59 and 86 degrees F). Keep  in a tightly closed container. Throw away any unused medicine after the expiration date. NOTE: This sheet is a summary. It may not cover all possible information. If you have questions about this medicine, talk to your doctor, pharmacist, or health care provider.    2016, Elsevier/Gold Standard. (2015-04-05 12:40:18)  You develop chest pain.  You develop a fast heartbeat.  You have a bluish color to your lips or fingernails.  You are light-headed, dizzy, or faint.  Your peak flow is less than 50% of your personal best.   This information is not intended to replace advice given to you by your health care provider. Make sure you discuss any questions you have with your health care provider.   Document Released: 08/25/2005  Document Revised: 05/16/2015 Document Reviewed: 03/24/2013 Elsevier Interactive Patient Education [employer]

## 2015-07-24 LAB — CULTURE, BLOOD (ROUTINE X 2)
Culture: NO GROWTH
Culture: NO GROWTH

## 2015-07-28 ENCOUNTER — Ambulatory Visit (INDEPENDENT_AMBULATORY_CARE_PROVIDER_SITE_OTHER): Payer: 59 | Admitting: Family Medicine

## 2015-07-28 VITALS — BP 126/80 | HR 110 | Temp 98.1°F | Resp 18 | Ht 64.0 in | Wt 320.6 lb

## 2015-07-28 DIAGNOSIS — E119 Type 2 diabetes mellitus without complications: Secondary | ICD-10-CM

## 2015-07-28 DIAGNOSIS — J189 Pneumonia, unspecified organism: Secondary | ICD-10-CM | POA: Diagnosis not present

## 2015-07-28 DIAGNOSIS — J4521 Mild intermittent asthma with (acute) exacerbation: Secondary | ICD-10-CM

## 2015-07-28 MED ORDER — IPRATROPIUM-ALBUTEROL 0.5-2.5 (3) MG/3ML IN SOLN: 3.0000 mL | Freq: Four times a day (QID) | RESPIRATORY_TRACT | Status: DC | PRN

## 2015-07-28 MED ORDER — ALBUTEROL SULFATE (2.5 MG/3ML) 0.083% IN NEBU
2.5000 mg | INHALATION_SOLUTION | Freq: Once | RESPIRATORY_TRACT | Status: AC
  Administered 2015-07-28: 2.5 mg via RESPIRATORY_TRACT

## 2015-07-28 MED ORDER — LEVOFLOXACIN 500 MG PO TABS: 500.0000 mg | ORAL_TABLET | Freq: Every day | ORAL | Status: DC

## 2015-07-28 MED ORDER — IPRATROPIUM BROMIDE 0.02 % IN SOLN
0.5000 mg | Freq: Once | RESPIRATORY_TRACT | Status: AC
  Administered 2015-07-28: 0.5 mg via RESPIRATORY_TRACT

## 2015-07-28 NOTE — Progress Notes (Addendum)
This chart was scribed for [name], MD by Watt Climes Rifaie medical scribe at [facility].The patient was seen in exam room 13 and the patient's care was started at 11:49 AM.  Patient ID: Alicia Lucero MRN: [id number], DOB: 1961-05-23, 54 y.o. Date of Encounter: 07/28/2015  Primary Physician: [name], MD  Chief Complaint:  Chief Complaint  Patient presents with  . Hospitalization Follow-up  . Pneumonia    HPI:  Alicia Lucero is a 54 y.o. female who presents to [facility] for a hospitalization follow up for PNA on 11/10.  Pt has a history of chest pain, community acquired PNA twice, and asthma twice. Pt states that she woke up this morning with shortness of breath. She indicates that she had a breathing treatment that only gave her mild relief. She states that any physical activity such as going down the stairs exacerbates her shortness of breath symptoms. She also reports symptoms of diaphoresis. Pt reports that she was feeling much better before her hospital discharge. Pt notes that she finished her Levaquin treatment yesterday, however she is still complaint with taking prednisone. Pt denies fever, nausea. She reports that she sleeps on a recliner chair, and indicates that could be causing her symptoms. She notes that when she sleeps on her side on the bed, she is able to sleep much better. She is concerned about having sleep apnea.      History reviewed. No pertinent past medical history.   Home Meds: Prior to Admission medications   Medication Sig Start Date End Date Taking? Authorizing Provider  ACCU-CHEK AVIVA PLUS test strip USE AS DIRECTED 05/07/14  Yes [name], MD  ACCU-CHEK SOFTCLIX LANCETS lancets USE AS DIRECTED   Yes Heather M Marte, PA-C  albuterol (PROVENTIL HFA;VENTOLIN HFA) 108 (90 BASE) MCG/ACT inhaler Inhale 2 puffs into the lungs every 6 (six) hours as needed for wheezing or shortness of breath. 10/08/14  Yes [name], MD  B Complex-C (B-COMPLEX WITH VITAMIN C) tablet Take 1 tablet by mouth daily.   Yes Historical Provider, MD  carbamazepine (TEGRETOL) 200 MG tablet Take 1.5 tablets (300 mg total) by mouth 3 (three) times daily. Patient taking differently: Take 200 mg by mouth 3 (three) times daily.  12/21/14  Yes [name], MD  clonazePAM (KLONOPIN) 2 MG tablet TAKE 1 TABLET BY MOUTH 3 TIMES DAILY AS NEEDED 07/11/15  Yes [name], MD  gabapentin (NEURONTIN) 300 MG capsule TAKE 2 CAPSULES BY MOUTH THREE TIMES A DAY 07/11/15  Yes [name], MD  Insulin Detemir (LEVEMIR FLEXTOUCH) 100 UNIT/ML Pen Inject 50 Units into the skin daily. 07/23/15  Yes Alison Murray, MD  Insulin Pen Needle 31G X 8 MM MISC 1 each by Does not apply route daily. 10/09/14  Yes Chelle Jeffery, PA-C  ipratropium-albuterol (DUONEB) 0.5-2.5 (3) MG/3ML SOLN Take 3 mLs by nebulization every 4 (four) hours as needed. 07/23/15  Yes Alison Murray, MD  ketoconazole (NIZORAL) 2 % cream Apply 1 application topically 2 (two) times daily. Patient taking differently: Apply 1 application topically 2 (two) times daily as needed for irritation.  07/19/15  Yes [name], MD  lamoTRIgine (LAMICTAL) 200 MG tablet Take 2 tablets (400 mg total) by mouth every evening. 07/11/15  Yes [name], MD  levofloxacin (LEVAQUIN) 500 MG tablet Take 1 tablet (500 mg total) by mouth daily. 07/23/15  Yes Alison Murray, MD  Liraglutide (VICTOZA) 18 MG/3ML SOPN Inject 1.8 mg into  the skin daily.    Yes Historical Provider, MD  lisinopril (PRINIVIL,ZESTRIL) 5 MG tablet Take 0.5 tablets (2.5 mg total) by mouth daily. 03/01/15  Yes Todd McVeigh, PA  metFORMIN (GLUCOPHAGE) 500 MG tablet Take 500 mg by mouth 2 (two) times daily with a meal.    Yes Historical Provider, MD  Multiple Vitamins-Minerals (WOMENS MULTIVITAMIN PLUS PO) Take 1 tablet by mouth daily.    Yes Historical Provider, MD  mupirocin ointment (BACTROBAN) 2 % APPLY TO AFFECTED AREAS  TWICE DAILY 09/22/13  Yes [name], MD  naproxen sodium (ANAPROX) 220 MG tablet Take 220 mg by mouth 2 (two) times daily as needed (for pain).   Yes Historical Provider, MD  Omega-3 Fatty Acids (FISH OIL PO) Take 2 capsules by mouth 2 (two) times daily.   Yes Historical Provider, MD  predniSONE (DELTASONE) 5 MG tablet Take 10 tablets (50 mg total) by mouth daily with breakfast. 07/23/15  Yes Alison Murray, MD  ranitidine (ZANTAC) 150 MG capsule Take 150 mg by mouth 2 (two) times daily.   Yes Historical Provider, MD  sertraline (ZOLOFT) 100 MG tablet Take 2 tablets (200 mg total) by mouth daily. 07/11/15  Yes [name], MD  traZODone (DESYREL) 100 MG tablet Take 1 tablet (100 mg total) by mouth at bedtime. Patient taking differently: Take 100 mg by mouth at bedtime as needed for sleep.  12/21/14  Yes [name], MD    Allergies:  Allergies  Allergen Reactions  . Empagliflozin Other (See Comments)    Causes yeast infection  . Empagliflozin-Linagliptin Other (See Comments)    Causes yeast infection  . Hydrocodone Other (See Comments)    Cough Syrup. Headache and sleepiness.   . Other Nausea Only    States she can't tolerate seafood smell    Social History   Social History  . Marital Status: Married    Spouse Name: [name]  . Number of Children: 0  . Years of Education: Assoc.   Occupational History  .  [employer]   Social History Main Topics  . Smoking status: Former Smoker    Types: Cigarettes    Quit date: 06/30/2005  . Smokeless tobacco: Never Used     Comment: electronic cigarettes PRN  . Alcohol Use: No     Comment: quit: 2007  . Drug Use: No     Comment: occasional  . Sexual Activity: Yes   Other Topics Concern  . Not on file   Social History Narrative   Patient lives at home with her spouse.   Caffeine Use: 5-7 caffeine drinks daily     Review of Systems: Constitutional: negative for chills, fever, night sweats, weight changes, or fatigue.  Positive for diaphoresis.  HEENT: negative for vision changes, hearing loss, congestion, rhinorrhea, ST, epistaxis, or sinus pressure Cardiovascular: negative for chest pain or palpitations Respiratory: negative for hemoptysis, wheezing, or cough. Positive for shortness of breath.  Abdominal: negative for abdominal pain, nausea, vomiting, diarrhea, or constipation Dermatological: negative for rash Neurologic: negative for headache, dizziness, or syncope All other systems reviewed and are otherwise negative with the exception to those above and in the HPI.  Physical Exam: Blood pressure 126/80, pulse 110, temperature 98.1 F (36.7 C), temperature source Oral, resp. rate 18, height 5\' 4"  (1.626 m), weight 320 lb 9.6 oz (145.423 kg), last menstrual period 07/04/2015, SpO2 95 %, peak flow 360 L/min., Body mass index is 55 kg/(m^2). General: Well developed, well nourished, in no acute distress. Head:  Normocephalic, atraumatic, eyes without discharge, sclera non-icteric, nares are without discharge. Bilateral auditory canals clear, TM's are without perforation, pearly grey and translucent with reflective cone of light bilaterally. Oral cavity moist, posterior pharynx without exudate, erythema, peritonsillar abscess, or post nasal drip.  Neck: Supple. No thyromegaly. Full ROM. No lymphadenopathy. Lungs: Bilateral expiratory wheezes with no rales. Breathing is nonlabored. Heart: RRR with S1 S2. No murmurs, rubs, or gallops appreciated. Abdomen: Soft, non-tender, non-distended with normoactive bowel sounds. No hepatomegaly. No rebound/guarding. No obvious abdominal masses. Msk:  Strength and tone normal for age. Extremities/Skin: Warm and dry. No clubbing or cyanosis. No edema. No rashes or suspicious lesions. Neuro: Alert and oriented X 3. Moves all extremities spontaneously. Gait is normal. CNII-XII grossly in tact. Psych:  Responds to questions appropriately with a normal affect.   Peak flow: 330,  predicted 400. Improved aeration and decreased wheezing after treatment.  ASSESSMENT AND PLAN:  55 y.o. year old female with asthma in context of recent hospitalization for CAP   By signing my name below, I, Rawaa Al Rifaie, attest that this documentation has been prepared under the direction and in the presence of [name], MD.  Broadus John, Medical Scribe. 07/28/2015.  12:04 PM. This chart was scribed in my presence and reviewed by me personally.    ICD-9-CM ICD-10-CM   1. Asthma, mild intermittent, with acute exacerbation 493.92 J45.21 albuterol (PROVENTIL) (2.5 MG/3ML) 0.083% nebulizer solution 2.5 mg     ipratropium (ATROVENT) nebulizer solution 0.5 mg     ipratropium-albuterol (DUONEB) 0.5-2.5 (3) MG/3ML SOLN  2. CAP (community acquired pneumonia) 486 J18.9 levofloxacin (LEVAQUIN) 500 MG tablet  3. Controlled type 2 diabetes mellitus without complication, without long-term current use of insulin (HCC) 250.00 E11.9 Ambulatory referral to Endocrinology     Ambulatory referral to Endocrinology   refill Levaquin Signed, [name], MD 07/28/2015 12:47 PM

## 2015-08-09 ENCOUNTER — Other Ambulatory Visit: Payer: Self-pay | Admitting: Family Medicine

## 2015-08-11 ENCOUNTER — Other Ambulatory Visit: Payer: Self-pay

## 2015-08-11 NOTE — Telephone Encounter (Signed)
Patient needs her gabapentin (NEURONTIN) 300 MG capsule refilled--shes uses the [facility] on [address], states she has called and asked for this multiple times, i do not see anything in the system--can someone please call her back and refill this thanks!!!  Her call back number is [phone]

## 2015-08-13 ENCOUNTER — Other Ambulatory Visit: Payer: Self-pay

## 2015-08-13 MED ORDER — GABAPENTIN 300 MG PO CAPS: ORAL_CAPSULE | ORAL | Status: DC

## 2015-08-13 NOTE — Telephone Encounter (Signed)
Pt called upset that she cannot get this refilled. She states she has been on this drug for many years and is struggling without it. I sent in refills.

## 2015-08-13 NOTE — Telephone Encounter (Signed)
I think Britta MccreedyBarbara sent you a message about this.

## 2015-08-13 NOTE — Telephone Encounter (Signed)
Dr L, OK to RF gabapentin. You saw pt last mos, but don't see this discussed recently.

## 2015-08-14 ENCOUNTER — Ambulatory Visit (INDEPENDENT_AMBULATORY_CARE_PROVIDER_SITE_OTHER): Payer: 59 | Admitting: Family Medicine

## 2015-08-14 VITALS — BP 112/70 | HR 84 | Temp 98.1°F | Resp 16 | Ht 64.0 in

## 2015-08-14 DIAGNOSIS — F32A Depression, unspecified: Secondary | ICD-10-CM

## 2015-08-14 DIAGNOSIS — K219 Gastro-esophageal reflux disease without esophagitis: Secondary | ICD-10-CM | POA: Diagnosis not present

## 2015-08-14 DIAGNOSIS — F329 Major depressive disorder, single episode, unspecified: Secondary | ICD-10-CM

## 2015-08-14 DIAGNOSIS — H9202 Otalgia, left ear: Secondary | ICD-10-CM

## 2015-08-14 DIAGNOSIS — E119 Type 2 diabetes mellitus without complications: Secondary | ICD-10-CM

## 2015-08-14 DIAGNOSIS — E041 Nontoxic single thyroid nodule: Secondary | ICD-10-CM

## 2015-08-14 MED ORDER — NEOMYCIN-POLYMYXIN-HC 3.5-10000-1 OT SOLN: 3.0000 [drp] | Freq: Four times a day (QID) | OTIC | Status: DC

## 2015-08-14 MED ORDER — RANITIDINE HCL 150 MG PO CAPS: 150.0000 mg | ORAL_CAPSULE | Freq: Two times a day (BID) | ORAL | Status: DC

## 2015-08-14 MED ORDER — GABAPENTIN 300 MG PO CAPS: ORAL_CAPSULE | ORAL | Status: DC

## 2015-08-14 NOTE — Addendum Note (Signed)
Addended by: Elvina SidleLAUENSTEIN, Estefani Bateson on: 08/14/2015 01:27 PM   Modules accepted: Orders

## 2015-08-14 NOTE — Patient Instructions (Signed)
Take the Liraglutide twice daily on even days and once daily on odd days.  Stay on Levemir 40 units daily.  Email me in two weeks with your most recent numbers.

## 2015-08-14 NOTE — Progress Notes (Addendum)
This 54 year old woman who is concerned about her diabetes. She just came from the endocrinologist who tried to increase her insulin dose. She's been on higher doses of insulin in the past and they haven't really helped bring her sugar down but instead have increased her appetite and weight gain.  She has done well with higher dose of Lyraglutide. She would like to try this before increasing her insulin.  Patient also complains about left ear pain. This is been intermittent over the last 24 hours.  Patient states that her pneumonia is largely resolved. She still has some coughing but generally feels fine. Her husband is coughing quite a bit now, but he is getting better as well.  Objective: If 54 year old morbidly obese woman in no acute distress She is anxious about her sugar. She's trying to walk more now that her pneumonia is cleared up and understands that a hemoglobin A1c of 8.2 is not acceptable.  We talked about strategies to get her sugar down including diet and exercise. We decided that taking higher dose of her lyraglutide would be reasonable.  Assessment: Insulin-dependent type 2 diabetic that is gone out of control during the holiday season. Renewed effort to bring the sugar down to be taken. We discussed the esophagitis and otalgia along with depression. Lungs are clear Left ear canal is reddened but both TMs are normal Neck: Supple no adenopathy Face-to-face counseling and discussion for 30 minutes  Plan: This chart was scribed in my presence and reviewed by me personally.    ICD-9-CM ICD-10-CM   1. Controlled type 2 diabetes mellitus without complication, without long-term current use of insulin (HCC) 250.00 E11.9   2. Otalgia, left 388.70 H92.02 neomycin-polymyxin-hydrocortisone (CORTISPORIN) otic solution  3. Gastroesophageal reflux disease without esophagitis 530.81 K21.9 ranitidine (ZANTAC) 150 MG capsule  4. Depression 311 F32.9 gabapentin (NEURONTIN) 300 MG capsule   5. Thyroid nodule 241.0 E04.1 US Soft Tissue Head/Neck     Signed, Elvina SidleKurt Jazlynne Milliner, MD

## 2015-08-17 ENCOUNTER — Telehealth: Payer: Self-pay

## 2015-08-17 NOTE — Telephone Encounter (Signed)
PA needed for ranitidine caps. Called pharm and she tried both caps and tablet forms and both require PA. Completed on covermymeds. Pending.

## 2015-08-24 NOTE — Telephone Encounter (Signed)
PA was denied bc ranitidine tabs and caps are plan exclusions, and no alternatives are listed on denial. However, online formulary for opt[employer]rx shows that ranitidine syrup is on the formulary (?), but not other drugs in the class. Do you want pt to just buy the tabs OTC? Following PPIs are listed on formulary: omeprazole caps, pantoprazole tabs, esomeprazole caps, lansoprazole caps, and rabeprazole tabs.

## 2015-08-25 NOTE — Telephone Encounter (Signed)
Try OTC ranitidine.

## 2015-08-26 NOTE — Telephone Encounter (Signed)
LM with female to cb

## 2015-08-28 NOTE — Telephone Encounter (Signed)
Notice was faxed back to pharm that PA was denied and that they should have pt buy OTC.

## 2015-08-29 ENCOUNTER — Other Ambulatory Visit: Payer: 59

## 2015-08-31 ENCOUNTER — Ambulatory Visit
Admission: RE | Admit: 2015-08-31 | Discharge: 2015-08-31 | Disposition: A | Discharge status: Home / Self Care | Payer: 59 | Source: Ambulatory Visit | Attending: Family Medicine | Admitting: Family Medicine

## 2015-08-31 ENCOUNTER — Other Ambulatory Visit: Payer: Self-pay | Admitting: Family Medicine

## 2015-08-31 DIAGNOSIS — E041 Nontoxic single thyroid nodule: Secondary | ICD-10-CM

## 2015-09-19 ENCOUNTER — Ambulatory Visit
Admission: RE | Admit: 2015-09-19 | Discharge: 2015-09-19 | Disposition: A | Discharge status: Home / Self Care | Payer: BLUE CROSS/BLUE SHIELD | Source: Ambulatory Visit | Attending: Family Medicine | Admitting: Family Medicine

## 2015-09-19 ENCOUNTER — Other Ambulatory Visit (HOSPITAL_COMMUNITY)
Admission: RE | Admit: 2015-09-19 | Discharge: 2015-09-19 | Disposition: A | Discharge status: Home / Self Care | Payer: BLUE CROSS/BLUE SHIELD | Source: Ambulatory Visit | Attending: Radiology | Admitting: Radiology

## 2015-09-19 DIAGNOSIS — E041 Nontoxic single thyroid nodule: Secondary | ICD-10-CM | POA: Diagnosis not present

## 2015-11-13 ENCOUNTER — Ambulatory Visit (INDEPENDENT_AMBULATORY_CARE_PROVIDER_SITE_OTHER): Payer: BLUE CROSS/BLUE SHIELD | Admitting: Family Medicine

## 2015-11-13 VITALS — BP 132/80 | HR 104 | Temp 98.0°F | Resp 20 | Ht 64.0 in | Wt 340.2 lb

## 2015-11-13 DIAGNOSIS — K219 Gastro-esophageal reflux disease without esophagitis: Secondary | ICD-10-CM

## 2015-11-13 DIAGNOSIS — M5432 Sciatica, left side: Secondary | ICD-10-CM

## 2015-11-13 DIAGNOSIS — E119 Type 2 diabetes mellitus without complications: Secondary | ICD-10-CM | POA: Diagnosis not present

## 2015-11-13 LAB — POCT GLYCOSYLATED HEMOGLOBIN (HGB A1C): Hemoglobin A1C: 8.1

## 2015-11-13 MED ORDER — OXYCODONE-ACETAMINOPHEN 7.5-325 MG PO TABS: 1.0000 | ORAL_TABLET | ORAL | Status: DC | PRN

## 2015-11-13 MED ORDER — PHENTERMINE HCL 37.5 MG PO CAPS: 37.5000 mg | ORAL_CAPSULE | ORAL | Status: DC

## 2015-11-13 MED ORDER — RANITIDINE HCL 150 MG PO CAPS
150.0000 mg | ORAL_CAPSULE | Freq: Two times a day (BID) | ORAL | Status: DC
Start: 2015-11-13 — End: 2016-02-11

## 2015-11-13 MED ORDER — INSULIN GLARGINE 300 UNIT/ML ~~LOC~~ SOPN
45.0000 [IU] | PEN_INJECTOR | Freq: Every day | SUBCUTANEOUS | Status: DC
Start: 2015-11-13 — End: 2016-02-11

## 2015-11-13 MED ORDER — PREDNISONE 20 MG PO TABS: ORAL_TABLET | ORAL | Status: DC

## 2015-11-13 NOTE — Progress Notes (Signed)
Patient ID: Alicia Lucero, female   DOB: 1961-01-25, 55 y.o.   MRN: [id number]  By signing my name below, I, Essence Howell, attest that this documentation has been prepared under the direction and in the presence of [name], MD Electronically Signed: Charline Bills, ED Scribe 11/13/2015 at 1:19 PM.  Patient ID: Alicia Lucero MRN: [id number], DOB: [birth year], 55 y.o. Date of Encounter: 11/13/2015, 1:02 PM  Primary Physician: [name], MD  Chief Complaint:  Chief Complaint  Patient presents with  . Ear Pain    sharp pain left ear   . Leg Injury    left leg  . Medication Refill    zantac    HPI: 55 y.o. year old female with history below presents with gradually worsening left leg pain onset 4 days ago. 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 She reports constant left leg pain that radiates into her left foot and is exacerbated with ambulating, palpation and prolonged sitting. Pt has been ambulating with her sister's cane with minimal relief. She reports h/o orthoscopic surgery on left knee in 2008.  Back Pain Pt reports recent sciatica that radiates down the back of both legs and into both feet. Back pain is worsened with movement. She requests medication for pain at this visit.  She took her sister's Vicodin and it did not touch the pain  Ear Pain Pt reports intermittent left ear pain for the past few days. She describes pain as a sharp sensation. Pt states that she has recently had sinus-related symptoms that improved with Sudafed. She has also tried previously prescribed ear drops without significant relief.   Weight Gain Pt reports a recent weight gain. Pt states that she has eliminated sodas and sweets from her diet but has not loss any weight. 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 She states that she has seen a nutritionist in the past but that did not help her. She was only advised to consumer more fruits  and vegetables.  Wt Readings from Last 3 Encounters:  11/13/15 340 lb 3.2 oz (154.314 kg)  07/28/15 320 lb 9.6 oz (145.423 kg)  07/22/15 328 lb 7.8 oz (149 kg)   Diabetes Pt states that her blood glucose is typically around 166 in the mornings, which she states is pretty good for her. Pt states that she has been complaint with 40 units of insulin and Victoza.   Medication Refill Pt also requests a refill of Zantac at this visit.   No past medical history on file.   Home Meds: Prior to Admission medications   Medication Sig Start Date End Date Taking? Authorizing Provider  ACCU-CHEK AVIVA PLUS test strip USE AS DIRECTED 05/07/14  Yes [name], MD  ACCU-CHEK SOFTCLIX LANCETS lancets USE AS DIRECTED   Yes Heather M Marte, PA-C  albuterol (PROVENTIL HFA;VENTOLIN HFA) 108 (90 BASE) MCG/ACT inhaler Inhale 2 puffs into the lungs every 6 (six) hours as needed for wheezing or shortness of breath. 10/08/14  Yes [name], MD  B Complex-C (B-COMPLEX WITH VITAMIN C) tablet Take 1 tablet by mouth daily.   Yes Historical Provider, MD  carbamazepine (TEGRETOL) 200 MG tablet Take 1.5 tablets (300 mg total) by mouth 3 (three) times daily. Patient taking differently: Take 200 mg by mouth 3 (three) times daily.  12/21/14  Yes [name], MD  clonazePAM (KLONOPIN) 2 MG tablet TAKE 1 TABLET BY MOUTH 3 TIMES DAILY AS NEEDED  07/11/15  Yes [name], MD  gabapentin (NEURONTIN) 300 MG capsule TAKE 2 CAPSULES BY MOUTH THREE TIMES A DAY 08/14/15  Yes [name], MD  Insulin Detemir (LEVEMIR FLEXTOUCH) 100 UNIT/ML Pen Inject 50 Units into the skin daily. 07/23/15  Yes Alison Murray, MD  Insulin Pen Needle 31G X 8 MM MISC 1 each by Does not apply route daily. 10/09/14  Yes Chelle Jeffery, PA-C  ketoconazole (NIZORAL) 2 % cream Apply 1 application topically 2 (two) times daily. Patient taking differently: Apply 1 application topically 2 (two) times daily as needed for irritation.  07/19/15  Yes  [name], MD  lamoTRIgine (LAMICTAL) 200 MG tablet Take 2 tablets (400 mg total) by mouth every evening. 07/11/15  Yes [name], MD  Liraglutide (VICTOZA) 18 MG/3ML SOPN Inject 1.8 mg into the skin daily.    Yes Historical Provider, MD  lisinopril (PRINIVIL,ZESTRIL) 5 MG tablet Take 0.5 tablets (2.5 mg total) by mouth daily. 03/01/15  Yes Todd McVeigh, PA  metFORMIN (GLUCOPHAGE) 500 MG tablet Take 500 mg by mouth 2 (two) times daily with a meal.    Yes Historical Provider, MD  Multiple Vitamins-Minerals (WOMENS MULTIVITAMIN PLUS PO) Take 1 tablet by mouth daily.    Yes Historical Provider, MD  mupirocin ointment (BACTROBAN) 2 % APPLY TO AFFECTED AREAS TWICE DAILY 09/22/13  Yes [name], MD  naproxen sodium (ANAPROX) 220 MG tablet Take 220 mg by mouth 2 (two) times daily as needed (for pain).   Yes Historical Provider, MD  neomycin-polymyxin-hydrocortisone (CORTISPORIN) otic solution Place 3 drops into the right ear 4 (four) times daily. 08/14/15  Yes [name], MD  Omega-3 Fatty Acids (FISH OIL PO) Take 2 capsules by mouth 2 (two) times daily.   Yes Historical Provider, MD  ranitidine (ZANTAC) 150 MG capsule Take 1 capsule (150 mg total) by mouth 2 (two) times daily. 08/14/15  Yes [name], MD  sertraline (ZOLOFT) 100 MG tablet Take 2 tablets (200 mg total) by mouth daily. 07/11/15  Yes [name], MD  traZODone (DESYREL) 100 MG tablet Take 1 tablet (100 mg total) by mouth at bedtime. Patient taking differently: Take 100 mg by mouth at bedtime as needed for sleep.  12/21/14  Yes [name], MD  ipratropium-albuterol (DUONEB) 0.5-2.5 (3) MG/3ML SOLN Take 3 mLs by nebulization every 6 (six) hours as needed. Patient not taking: Reported on 11/13/2015 07/28/15   [name], MD    Allergies:  Allergies  Allergen Reactions  . Empagliflozin Other (See Comments)    Causes yeast infection  . Empagliflozin-Linagliptin Other (See Comments)    Causes yeast  infection  . Hydrocodone Other (See Comments)    Cough Syrup. Headache and sleepiness.   . Other Nausea Only    States she can't tolerate seafood smell    Social History   Social History  . Marital Status: Married    Spouse Name: [name]  . Number of Children: 0  . Years of Education: Assoc.   Occupational History  .  [employer]   Social History Main Topics  . Smoking status: Former Smoker    Types: Cigarettes    Quit date: 06/30/2005  . Smokeless tobacco: Never Used     Comment: electronic cigarettes PRN  . Alcohol Use: No     Comment: quit: 2007  . Drug Use: No     Comment: occasional  . Sexual Activity: Yes   Other Topics Concern  . Not on file   Social History  Narrative   Patient lives at home with her spouse.   Caffeine Use: 5-7 caffeine drinks daily    Review of Systems: Constitutional: negative for chills, fever, night sweats, or fatigue, +weight changes HEENT: negative for vision changes, hearing loss, congestion, rhinorrhea, ST, epistaxis, or sinus pressure, +ear pain Cardiovascular: negative for chest pain or palpitations Respiratory: negative for hemoptysis, wheezing, shortness of breath, or cough Abdominal: negative for abdominal pain, nausea, vomiting, diarrhea, or constipation Msk: +arthralgias, +back pain Dermatological: negative for rash Neurologic: negative for headache, dizziness, or syncope All other systems reviewed and are otherwise negative with the exception to those above and in the HPI.  Physical Exam: Blood pressure 132/80, pulse 104, temperature 98 F (36.7 C), temperature source Oral, resp. rate 20, height 5\' 4"  (1.626 m), weight 340 lb 3.2 oz (154.314 kg), last menstrual period 10/11/2015, SpO2 96 %., Body mass index is 58.37 kg/(m^2). General: Morbid obesity and body habitus. Pt is very upset about her weight. Well developed, well nourished, in no acute distress.  Head: Normocephalic, atraumatic, eyes without discharge, sclera  non-icteric, nares are without discharge. Bilateral auditory canals clear, L TM is bulging without erythema or fluid. Oral cavity moist, posterior pharynx without exudate, erythema, peritonsillar abscess, or post nasal drip.  Neck: Supple. No thyromegaly. Full ROM. No lymphadenopathy. Lungs: Clear bilaterally to auscultation without wheezes, rales, or rhonchi. Breathing is unlabored. Heart: RRR with S1 S2. No murmurs, rubs, or gallops appreciated. Abdomen: Soft, non-tender, non-distended with normoactive bowel sounds. No hepatomegaly. No rebound/guarding. No obvious abdominal masses. Msk:  Strength and tone normal for age. Tender medial joint line of L knee. Tender posterior L leg from thigh to mid calf. Normal ROM of L hip and knee.  Extremities/Skin: Warm and dry. No clubbing or cyanosis. No edema. No rashes or suspicious lesions. Neuro: Alert and oriented X 3. Moves all extremities spontaneously. Gait is normal. CNII-XII grossly in tact. Psych:  Responds to questions appropriately with a normal affect.    after informed consent was obtained, the left knee was prepped in the lateral joint line and Marcaine with Depo-Medrol was injected without difficulty or complication. Patient tolerated the procedure well.  ASSESSMENT AND PLAN:  55 y.o. year old female with  No diagnosis found.  This chart was scribed in my presence and reviewed by me personally.    ICD-9-CM ICD-10-CM   1. Gastroesophageal reflux disease without esophagitis 530.81 K21.9 ranitidine (ZANTAC) 150 MG capsule  2. Sciatica of left side 724.3 M54.32 oxyCODONE-acetaminophen (PERCOCET) 7.5-325 MG tablet     predniSONE (DELTASONE) 20 MG tablet  3. Morbid obesity, unspecified obesity type (HCC) 278.01 E66.01 phentermine 37.5 MG capsule  4. Controlled type 2 diabetes mellitus without complication, without long-term current use of insulin (HCC) 250.00 E11.9 Insulin Glargine (TOUJEO SOLOSTAR) 300 UNIT/ML SOPN    Signed, [name], MD 11/13/2015 1:02 PM

## 2015-11-13 NOTE — Patient Instructions (Signed)
Please call me if you  Are not improving in the next couple days.

## 2015-11-18 ENCOUNTER — Telehealth: Payer: Self-pay | Admitting: Family Medicine

## 2015-11-18 NOTE — Telephone Encounter (Signed)
Patient feeling better but glucose still over 200.

## 2015-11-29 ENCOUNTER — Telehealth: Payer: Self-pay | Admitting: *Deleted

## 2015-11-29 NOTE — Telephone Encounter (Signed)
Pt wanted you to know that she has not been taking the phentermine because it makes her feel bad.  She has also been feeling fatigue.

## 2015-12-11 ENCOUNTER — Other Ambulatory Visit: Payer: Self-pay | Admitting: Family Medicine

## 2015-12-12 NOTE — Telephone Encounter (Signed)
Dr L, you saw pt just last month for check up and discussed this med, but I don't see that you have Rxd it before. You Rx her other DM meds. Do you want to RF?

## 2016-01-02 ENCOUNTER — Ambulatory Visit (INDEPENDENT_AMBULATORY_CARE_PROVIDER_SITE_OTHER): Payer: BLUE CROSS/BLUE SHIELD

## 2016-01-02 ENCOUNTER — Ambulatory Visit (INDEPENDENT_AMBULATORY_CARE_PROVIDER_SITE_OTHER): Payer: BLUE CROSS/BLUE SHIELD | Admitting: Family Medicine

## 2016-01-02 ENCOUNTER — Encounter: Payer: Self-pay | Admitting: Family Medicine

## 2016-01-02 VITALS — BP 130/86 | HR 90 | Temp 98.8°F | Resp 16 | Ht 63.75 in | Wt 331.8 lb

## 2016-01-02 DIAGNOSIS — M7541 Impingement syndrome of right shoulder: Secondary | ICD-10-CM

## 2016-01-02 DIAGNOSIS — M25511 Pain in right shoulder: Secondary | ICD-10-CM

## 2016-01-02 MED ORDER — HYDROCODONE-ACETAMINOPHEN 5-325 MG PO TABS: 1.0000 | ORAL_TABLET | Freq: Four times a day (QID) | ORAL | Status: DC | PRN

## 2016-01-02 NOTE — Patient Instructions (Addendum)
IF you received an x-ray today, you will receive an invoice from [facility]. Please contact [facility] at [phone] with questions or concerns regarding your invoice.   IF you received labwork today, you will receive an invoice from [employer]. Please contact [facility] at [phone] with questions or concerns regarding your invoice.   Our billing staff will not be able to assist you with questions regarding bills from these companies.  You will be contacted with the lab results as soon as they are available. The fastest way to get your results is to activate your My Chart account. Instructions are located on the last page of this paperwork. If you have not heard from [place] regarding the results in 2 weeks, please contact this office.    Physical therapy may help your shoulder and I provided other info and exercises below. Hydrocodone only if needed for breakthrough pain. Short term ibuprofen up to 600mg  every 6 hours as needed, but persistent use of this medicine can increase risk of heart issues. If not improving in next few weeks, consider another injection, MRI or orthopaedic evaluation. Sooner if worse.   Impingement Syndrome, Rotator Cuff, Bursitis With Rehab Impingement syndrome is a condition that involves inflammation of the tendons of the rotator cuff and the subacromial bursa, that causes pain in the shoulder. The rotator cuff consists of four tendons and muscles that control much of the shoulder and upper arm function. The subacromial bursa is a fluid filled sac that helps reduce friction between the rotator cuff and one of the bones of the shoulder (acromion). Impingement syndrome is usually an overuse injury that causes swelling of the bursa (bursitis), swelling of the tendon (tendonitis), and/or a tear of the tendon (strain). Strains are classified into three categories. Grade 1 strains cause pain, but the tendon is not lengthened.  Grade 2 strains include a lengthened ligament, due to the ligament being stretched or partially ruptured. With grade 2 strains there is still function, although the function may be decreased. Grade 3 strains include a complete tear of the tendon or muscle, and function is usually impaired. SYMPTOMS   Pain around the shoulder, often at the outer portion of the upper arm.  Pain that gets worse with shoulder function, especially when reaching overhead or lifting.  Sometimes, aching when not using the arm.  Pain that wakes you up at night.  Sometimes, tenderness, swelling, warmth, or redness over the affected area.  Loss of strength.  Limited motion of the shoulder, especially reaching behind the back (to the back pocket or to unhook bra) or across your body.  Crackling sound (crepitation) when moving the arm.  Biceps tendon pain and inflammation (in the front of the shoulder). Worse when bending the elbow or lifting. CAUSES  Impingement syndrome is often an overuse injury, in which chronic (repetitive) motions cause the tendons or bursa to become inflamed. A strain occurs when a force is paced on the tendon or muscle that is greater than it can withstand. Common mechanisms of injury include: Stress from sudden increase in duration, frequency, or intensity of training.  Direct hit (trauma) to the shoulder.  Aging, erosion of the tendon with normal use.  Bony bump on shoulder (acromial spur). RISK INCREASES WITH:  Contact sports (football, wrestling, boxing).  Throwing sports (baseball, tennis, volleyball).  Weightlifting and bodybuilding.  Heavy labor.  Previous injury to the rotator cuff, including impingement.  Poor shoulder strength and flexibility.  Failure to warm  up properly before activity.  Inadequate protective equipment.  Old age.  Bony bump on shoulder (acromial spur). PREVENTION   Warm up and stretch properly before activity.  Allow for adequate recovery  between workouts.  Maintain physical fitness:  Strength, flexibility, and endurance.  Cardiovascular fitness.  Learn and use proper exercise technique. PROGNOSIS  If treated properly, impingement syndrome usually goes away within 6 weeks. Sometimes surgery is required.  RELATED COMPLICATIONS   Longer healing time if not properly treated, or if not given enough time to heal.  Recurring symptoms, that result in a chronic condition.  Shoulder stiffness, frozen shoulder, or loss of motion.  Rotator cuff tendon tear.  Recurring symptoms, especially if activity is resumed too soon, with overuse, with a direct blow, or when using poor technique. TREATMENT  Treatment first involves the use of ice and medicine, to reduce pain and inflammation. The use of strengthening and stretching exercises may help reduce pain with activity. These exercises may be performed at home or with a therapist. If non-surgical treatment is unsuccessful after more than 6 months, surgery may be advised. After surgery and rehabilitation, activity is usually possible in 3 months.  MEDICATION  If pain medicine is needed, nonsteroidal anti-inflammatory medicines (aspirin and ibuprofen), or other minor pain relievers (acetaminophen), are often advised.  Do not take pain medicine for 7 days before surgery.  Prescription pain relievers may be given, if your caregiver thinks they are needed. Use only as directed and only as much as you need.  Corticosteroid injections may be given by your caregiver. These injections should be reserved for the most serious cases, because they may only be given a certain number of times. HEAT AND COLD  Cold treatment (icing) should be applied for 10 to 15 minutes every 2 to 3 hours for inflammation and pain, and immediately after activity that aggravates your symptoms. Use ice packs or an ice massage.  Heat treatment may be used before performing stretching and strengthening activities  prescribed by your caregiver, physical therapist, or athletic trainer. Use a heat pack or a warm water soak. SEEK MEDICAL CARE IF:   Symptoms get worse or do not improve in 4 to 6 weeks, despite treatment.  New, unexplained symptoms develop. (Drugs used in treatment may produce side effects.) EXERCISES  RANGE OF MOTION (ROM) AND STRETCHING EXERCISES - Impingement Syndrome (Rotator Cuff  Tendinitis, Bursitis) These exercises may help you when beginning to rehabilitate your injury. Your symptoms may go away with or without further involvement from your physician, physical therapist or athletic trainer. While completing these exercises, remember:   Restoring tissue flexibility helps normal motion to return to the joints. This allows healthier, less painful movement and activity.  An effective stretch should be held for at least 30 seconds.  A stretch should never be painful. You should only feel a gentle lengthening or release in the stretched tissue. STRETCH - Flexion, Standing  Stand with good posture. With an underhand grip on your right / left hand, and an overhand grip on the opposite hand, grasp a broomstick or cane so that your hands are a little more than shoulder width apart.  Keeping your right / left elbow straight and shoulder muscles relaxed, push the stick with your opposite hand, to raise your right / left arm in front of your body and then overhead. Raise your arm until you feel a stretch in your right / left shoulder, but before you have increased shoulder pain.  Try to avoid  shrugging your right / left shoulder as your arm rises, by keeping your shoulder blade tucked down and toward your mid-back spine. Hold for __________ seconds.  Slowly return to the starting position. Repeat __________ times. Complete this exercise __________ times per day. STRETCH - Abduction, Supine  Lie on your back. With an underhand grip on your right / left hand and an overhand grip on the  opposite hand, grasp a broomstick or cane so that your hands are a little more than shoulder width apart.  Keeping your right / left elbow straight and your shoulder muscles relaxed, push the stick with your opposite hand, to raise your right / left arm out to the side of your body and then overhead. Raise your arm until you feel a stretch in your right / left shoulder, but before you have increased shoulder pain.  Try to avoid shrugging your right / left shoulder as your arm rises, by keeping your shoulder blade tucked down and toward your mid-back spine. Hold for __________ seconds.  Slowly return to the starting position. Repeat __________ times. Complete this exercise __________ times per day. ROM - Flexion, Active-Assisted  Lie on your back. You may bend your knees for comfort.  Grasp a broomstick or cane so your hands are about shoulder width apart. Your right / left hand should grip the end of the stick, so that your hand is positioned "thumbs-up," as if you were about to shake hands.  Using your healthy arm to lead, raise your right / left arm overhead, until you feel a gentle stretch in your shoulder. Hold for __________ seconds.  Use the stick to assist in returning your right / left arm to its starting position. Repeat __________ times. Complete this exercise __________ times per day.  ROM - Internal Rotation, Supine   Lie on your back on a firm surface. Place your right / left elbow about 60 degrees away from your side. Elevate your elbow with a folded towel, so that the elbow and shoulder are the same height.  Using a broomstick or cane and your strong arm, pull your right / left hand toward your body until you feel a gentle stretch, but no increase in your shoulder pain. Keep your shoulder and elbow in place throughout the exercise.  Hold for __________ seconds. Slowly return to the starting position. Repeat __________ times. Complete this exercise __________ times per  day. STRETCH - Internal Rotation  Place your right / left hand behind your back, palm up.  Throw a towel or belt over your opposite shoulder. Grasp the towel with your right / left hand.  While keeping an upright posture, gently pull up on the towel, until you feel a stretch in the front of your right / left shoulder.  Avoid shrugging your right / left shoulder as your arm rises, by keeping your shoulder blade tucked down and toward your mid-back spine.  Hold for __________ seconds. Release the stretch, by lowering your healthy hand. Repeat __________ times. Complete this exercise __________ times per day. ROM - Internal Rotation   Using an underhand grip, grasp a stick behind your back with both hands.  While standing upright with good posture, slide the stick up your back until you feel a mild stretch in the front of your shoulder.  Hold for __________ seconds. Slowly return to your starting position. Repeat __________ times. Complete this exercise __________ times per day.  STRETCH - Posterior Shoulder Capsule   Stand or sit with good  posture. Grasp your right / left elbow and draw it across your chest, keeping it at the same height as your shoulder.  Pull your elbow, so your upper arm comes in closer to your chest. Pull until you feel a gentle stretch in the back of your shoulder.  Hold for __________ seconds. Repeat __________ times. Complete this exercise __________ times per day. STRENGTHENING EXERCISES - Impingement Syndrome (Rotator Cuff Tendinitis, Bursitis) These exercises may help you when beginning to rehabilitate your injury. They may resolve your symptoms with or without further involvement from your physician, physical therapist or athletic trainer. While completing these exercises, remember:  Muscles can gain both the endurance and the strength needed for everyday activities through controlled exercises.  Complete these exercises as instructed by your physician,  physical therapist or athletic trainer. Increase the resistance and repetitions only as guided.  You may experience muscle soreness or fatigue, but the pain or discomfort you are trying to eliminate should never worsen during these exercises. If this pain does get worse, stop and make sure you are following the directions exactly. If the pain is still present after adjustments, discontinue the exercise until you can discuss the trouble with your clinician.  During your recovery, avoid activity or exercises which involve actions that place your injured hand or elbow above your head or behind your back or head. These positions stress the tissues which you are trying to heal. STRENGTH - Scapular Depression and Adduction   With good posture, sit on a firm chair. Support your arms in front of you, with pillows, arm rests, or on a table top. Have your elbows in line with the sides of your body.  Gently draw your shoulder blades down and toward your mid-back spine. Gradually increase the tension, without tensing the muscles along the top of your shoulders and the back of your neck.  Hold for __________ seconds. Slowly release the tension and relax your muscles completely before starting the next repetition.  After you have practiced this exercise, remove the arm support and complete the exercise in standing as well as sitting position. Repeat __________ times. Complete this exercise __________ times per day.  STRENGTH - Shoulder Abductors, Isometric  With good posture, stand or sit about 4-6 inches from a wall, with your right / left side facing the wall.  Bend your right / left elbow. Gently press your right / left elbow into the wall. Increase the pressure gradually, until you are pressing as hard as you can, without shrugging your shoulder or increasing any shoulder discomfort.  Hold for __________ seconds.  Release the tension slowly. Relax your shoulder muscles completely before you begin the  next repetition. Repeat __________ times. Complete this exercise __________ times per day.  STRENGTH - External Rotators, Isometric  Keep your right / left elbow at your side and bend it 90 degrees.  Step into a door frame so that the outside of your right / left wrist can press against the door frame without your upper arm leaving your side.  Gently press your right / left wrist into the door frame, as if you were trying to swing the back of your hand away from your stomach. Gradually increase the tension, until you are pressing as hard as you can, without shrugging your shoulder or increasing any shoulder discomfort.  Hold for __________ seconds.  Release the tension slowly. Relax your shoulder muscles completely before you begin the next repetition. Repeat __________ times. Complete this exercise __________ times per  day.  STRENGTH - Supraspinatus   Stand or sit with good posture. Grasp a __________ weight, or an exercise band or tubing, so that your hand is "thumbs-up," like you are shaking hands.  Slowly lift your right / left arm in a "V" away from your thigh, diagonally into the space between your side and straight ahead. Lift your hand to shoulder height or as far as you can, without increasing any shoulder pain. At first, many people do not lift their hands above shoulder height.  Avoid shrugging your right / left shoulder as your arm rises, by keeping your shoulder blade tucked down and toward your mid-back spine.  Hold for __________ seconds. Control the descent of your hand, as you slowly return to your starting position. Repeat __________ times. Complete this exercise __________ times per day.  STRENGTH - External Rotators  Secure a rubber exercise band or tubing to a fixed object (table, pole) so that it is at the same height as your right / left elbow when you are standing or sitting on a firm surface.  Stand or sit so that the secured exercise band is at your uninjured  side.  Bend your right / left elbow 90 degrees. Place a folded towel or small pillow under your right / left arm, so that your elbow is a few inches away from your side.  Keeping the tension on the exercise band, pull it away from your body, as if pivoting on your elbow. Be sure to keep your body steady, so that the movement is coming only from your rotating shoulder.  Hold for __________ seconds. Release the tension in a controlled manner, as you return to the starting position. Repeat __________ times. Complete this exercise __________ times per day.  STRENGTH - Internal Rotators   Secure a rubber exercise band or tubing to a fixed object (table, pole) so that it is at the same height as your right / left elbow when you are standing or sitting on a firm surface.  Stand or sit so that the secured exercise band is at your right / left side.  Bend your elbow 90 degrees. Place a folded towel or small pillow under your right / left arm so that your elbow is a few inches away from your side.  Keeping the tension on the exercise band, pull it across your body, toward your stomach. Be sure to keep your body steady, so that the movement is coming only from your rotating shoulder.  Hold for __________ seconds. Release the tension in a controlled manner, as you return to the starting position. Repeat __________ times. Complete this exercise __________ times per day.  STRENGTH - Scapular Protractors, Standing   Stand arms length away from a wall. Place your hands on the wall, keeping your elbows straight.  Begin by dropping your shoulder blades down and toward your mid-back spine.  To strengthen your protractors, keep your shoulder blades down, but slide them forward on your rib cage. It will feel as if you are lifting the back of your rib cage away from the wall. This is a subtle motion and can be challenging to complete. Ask your caregiver for further instruction, if you are not sure you are doing  the exercise correctly.  Hold for __________ seconds. Slowly return to the starting position, resting the muscles completely before starting the next repetition. Repeat __________ times. Complete this exercise __________ times per day. STRENGTH - Scapular Protractors, Supine  Lie on your back on  a firm surface. Extend your right / left arm straight into the air while holding a __________ weight in your hand.  Keeping your head and back in place, lift your shoulder off the floor.  Hold for __________ seconds. Slowly return to the starting position, and allow your muscles to relax completely before starting the next repetition. Repeat __________ times. Complete this exercise __________ times per day. STRENGTH - Scapular Protractors, Quadruped  Get onto your hands and knees, with your shoulders directly over your hands (or as close as you can be, comfortably).  Keeping your elbows locked, lift the back of your rib cage up into your shoulder blades, so your mid-back rounds out. Keep your neck muscles relaxed.  Hold this position for __________ seconds. Slowly return to the starting position and allow your muscles to relax completely before starting the next repetition. Repeat __________ times. Complete this exercise __________ times per day.  STRENGTH - Scapular Retractors  Secure a rubber exercise band or tubing to a fixed object (table, pole), so that it is at the height of your shoulders when you are either standing, or sitting on a firm armless chair.  With a palm down grip, grasp an end of the band in each hand. Straighten your elbows and lift your hands straight in front of you, at shoulder height. Step back, away from the secured end of the band, until it becomes tense.  Squeezing your shoulder blades together, draw your elbows back toward your sides, as you bend them. Keep your upper arms lifted away from your body throughout the exercise.  Hold for __________ seconds. Slowly ease the  tension on the band, as you reverse the directions and return to the starting position. Repeat __________ times. Complete this exercise __________ times per day. STRENGTH - Shoulder Extensors   Secure a rubber exercise band or tubing to a fixed object (table, pole) so that it is at the height of your shoulders when you are either standing, or sitting on a firm armless chair.  With a thumbs-up grip, grasp an end of the band in each hand. Straighten your elbows and lift your hands straight in front of you, at shoulder height. Step back, away from the secured end of the band, until it becomes tense.  Squeezing your shoulder blades together, pull your hands down to the sides of your thighs. Do not allow your hands to go behind you.  Hold for __________ seconds. Slowly ease the tension on the band, as you reverse the directions and return to the starting position. Repeat __________ times. Complete this exercise __________ times per day.  STRENGTH - Scapular Retractors and External Rotators   Secure a rubber exercise band or tubing to a fixed object (table, pole) so that it is at the height as your shoulders, when you are either standing, or sitting on a firm armless chair.  With a palm down grip, grasp an end of the band in each hand. Bend your elbows 90 degrees and lift your elbows to shoulder height, at your sides. Step back, away from the secured end of the band, until it becomes tense.  Squeezing your shoulder blades together, rotate your shoulders so that your upper arms and elbows remain stationary, but your fists travel upward to head height.  Hold for __________ seconds. Slowly ease the tension on the band, as you reverse the directions and return to the starting position. Repeat __________ times. Complete this exercise __________ times per day.  STRENGTH - Scapular Retractors  and External Rotators, Rowing   Secure a rubber exercise band or tubing to a fixed object (table, pole) so that it  is at the height of your shoulders, when you are either standing, or sitting on a firm armless chair.  With a palm down grip, grasp an end of the band in each hand. Straighten your elbows and lift your hands straight in front of you, at shoulder height. Step back, away from the secured end of the band, until it becomes tense.  Step 1: Squeeze your shoulder blades together. Bending your elbows, draw your hands to your chest, as if you are rowing a boat. At the end of this motion, your hands and elbow should be at shoulder height and your elbows should be out to your sides.  Step 2: Rotate your shoulders, to raise your hands above your head. Your forearms should be vertical and your upper arms should be horizontal.  Hold for __________ seconds. Slowly ease the tension on the band, as you reverse the directions and return to the starting position. Repeat __________ times. Complete this exercise __________ times per day.  STRENGTH - Scapular Depressors  Find a sturdy chair without wheels, such as a dining room chair.  Keeping your feet on the floor, and your hands on the chair arms, lift your bottom up from the seat, and lock your elbows.  Keeping your elbows straight, allow gravity to pull your body weight down. Your shoulders will rise toward your ears.  Raise your body against gravity by drawing your shoulder blades down your back, shortening the distance between your shoulders and ears. Although your feet should always maintain contact with the floor, your feet should progressively support less body weight, as you get stronger.  Hold for __________ seconds. In a controlled and slow manner, lower your body weight to begin the next repetition. Repeat __________ times. Complete this exercise __________ times per day.    This information is not intended to replace advice given to you by your health care provider. Make sure you discuss any questions you have with your health care provider.    Document Released: 08/25/2005 Document Revised: 09/15/2014 Document Reviewed: 12/07/2008 [employer] Interactive Patient Education [employer]

## 2016-01-02 NOTE — Progress Notes (Signed)
Subjective:  By signing my name below, I, Alicia Lucero, attest that this documentation has been prepared under the direction and in the presence of Meredith Staggers, MD. Electronically Signed: Stann Lucero, Scribe. 01/02/2016 , 4:32 PM .  Patient was seen in Room 25 .   Patient ID: Alicia Lucero, female    DOB: Jul 26, 1961, 55 y.o.   MRN: [id number] Chief Complaint  Patient presents with  . right shoulder    pain, since mid March 2017, per pt fallen last Sept 2016  . depression screening during triage    score 11   HPI Alicia Lucero is a 55 y.o. female Here for right shoulder pain. Patient of Dr. Milus Glazier, last seen approximately 6 weeks ago. H/o DM, bipolar disorder with depression, asthma, obesity, diabetic peripheral neuropathy.   Positive depression screening Treated for bipolar disorder, denies suicidal ideation.    Right Shoulder Pain Appears she was evaluated for right rotator cuff sprain and right shoulder pain in Sept 2016 after falling and landing on her right arm over concrete. She was given DepoMedrol injection. She didn't have an xray during that visit.   After the DepoMedrol injection in Sept 2016, she states her persistent right shoulder pain improved around Christmas 2016. However, she reports the pain returning around mid-March 2017. She felt clicking in her shoulder and cracking in her neck. She's tried using topical treatment for her arm. She's also taken alternating aleve 3 tablets and ibuprofen 4 tablets, every 12 hours. She denies any recent falls or injuries to the area.   She plans to see physical therapist tomorrow for her right shoulder.   Patient Active Problem List   Diagnosis Date Noted  . Type 2 diabetes mellitus with diabetic neuropathy, with long-term current use of insulin (HCC) 07/19/2015  . Dyslipidemia associated with type 2 diabetes mellitus (HCC) 07/19/2015  . Benign essential HTN 07/19/2015  . Bipolar 1 disorder (HCC) 03/01/2015  . Morbid  obesity with BMI of 50.0-59.9, adult (HCC) 12/26/2014  . Asthma, chronic 09/21/2013  . Depression 01/29/2012   No past medical history on file. Past Surgical History  Procedure Laterality Date  . Cholecystectomy  1996  . Tonsillectomy      as a child  . Knee arthroscopy Left 2008  . Dilation and curettage of uterus      secondary to menorrhagia  . Wisdom tooth extraction     Allergies  Allergen Reactions  . Empagliflozin Other (See Comments)    Causes yeast infection  . Empagliflozin-Linagliptin Other (See Comments)    Causes yeast infection  . Other Nausea Only    States she can't tolerate seafood smell  . Oxycodone     Sleepy, nausea, diarrhea, vomiting   Prior to Admission medications   Medication Sig Start Date End Date Taking? Authorizing Provider  ACCU-CHEK AVIVA PLUS test strip USE AS DIRECTED 05/07/14   [name], MD  ACCU-CHEK SOFTCLIX LANCETS lancets USE AS DIRECTED    Nelva Nay, PA-C  albuterol (PROVENTIL HFA;VENTOLIN HFA) 108 (90 BASE) MCG/ACT inhaler Inhale 2 puffs into the lungs every 6 (six) hours as needed for wheezing or shortness of breath. 10/08/14   [name], MD  B Complex-C (B-COMPLEX WITH VITAMIN C) tablet Take 1 tablet by mouth daily.    Historical Provider, MD  carbamazepine (TEGRETOL) 200 MG tablet Take 1.5 tablets (300 mg total) by mouth 3 (three) times daily. Patient taking differently: Take 200 mg by mouth 3 (three) times daily.  12/21/14  [name], MD  clonazePAM (KLONOPIN) 2 MG tablet TAKE 1 TABLET BY MOUTH 3 TIMES DAILY AS NEEDED 07/11/15   [name], MD  gabapentin (NEURONTIN) 300 MG capsule TAKE 2 CAPSULES BY MOUTH THREE TIMES A DAY 08/14/15   [name], MD  Insulin Detemir (LEVEMIR FLEXTOUCH) 100 UNIT/ML Pen Inject 50 Units into the skin daily. 07/23/15   Alison Murray, MD  Insulin Glargine (TOUJEO SOLOSTAR) 300 UNIT/ML SOPN Inject 45 Units into the skin daily. 11/13/15   [name], MD  Insulin Pen Needle  31G X 8 MM MISC 1 each by Does not apply route daily. 10/09/14   Chelle Jeffery, PA-C  ipratropium-albuterol (DUONEB) 0.5-2.5 (3) MG/3ML SOLN Take 3 mLs by nebulization every 6 (six) hours as needed. Patient not taking: Reported on 11/13/2015 07/28/15   [name], MD  ketoconazole (NIZORAL) 2 % cream Apply 1 application topically 2 (two) times daily. Patient taking differently: Apply 1 application topically 2 (two) times daily as needed for irritation.  07/19/15   [name], MD  lamoTRIgine (LAMICTAL) 200 MG tablet Take 2 tablets (400 mg total) by mouth every evening. 07/11/15   [name], MD  lisinopril (PRINIVIL,ZESTRIL) 5 MG tablet Take 0.5 tablets (2.5 mg total) by mouth daily. 03/01/15   Raelyn Ensign, PA  metFORMIN (GLUCOPHAGE) 500 MG tablet Take 500 mg by mouth 2 (two) times daily with a meal.     Historical Provider, MD  Multiple Vitamins-Minerals (WOMENS MULTIVITAMIN PLUS PO) Take 1 tablet by mouth daily.     Historical Provider, MD  mupirocin ointment (BACTROBAN) 2 % APPLY TO AFFECTED AREAS TWICE DAILY 09/22/13   [name], MD  naproxen sodium (ANAPROX) 220 MG tablet Take 220 mg by mouth 2 (two) times daily as needed (for pain).    Historical Provider, MD  neomycin-polymyxin-hydrocortisone (CORTISPORIN) otic solution Place 3 drops into the right ear 4 (four) times daily. 08/14/15   [name], MD  Omega-3 Fatty Acids (FISH OIL PO) Take 2 capsules by mouth 2 (two) times daily.    Historical Provider, MD  oxyCODONE-acetaminophen (PERCOCET) 7.5-325 MG tablet Take 1 tablet by mouth every 4 (four) hours as needed for severe pain. 11/13/15   [name], MD  phentermine 37.5 MG capsule Take 1 capsule (37.5 mg total) by mouth every morning. 11/13/15   [name], MD  predniSONE (DELTASONE) 20 MG tablet Two daily with food 11/13/15   [name], MD  ranitidine (ZANTAC) 150 MG capsule Take 1 capsule (150 mg total) by mouth 2 (two) times daily. 11/13/15   [name], MD   sertraline (ZOLOFT) 100 MG tablet Take 2 tablets (200 mg total) by mouth daily. 07/11/15   [name], MD  traZODone (DESYREL) 100 MG tablet Take 1 tablet (100 mg total) by mouth at bedtime. Patient taking differently: Take 100 mg by mouth at bedtime as needed for sleep.  12/21/14   [name], MD  VICTOZA 18 MG/3ML SOPN INJECT 0.3 MILLILITERS (1.8 MG) INTO THE SKIN DAILY 12/12/15   [name], MD   Social History   Social History  . Marital Status: Married    Spouse Name: [name]  . Number of Children: 0  . Years of Education: Assoc.   Occupational History  .  [employer]   Social History Main Topics  . Smoking status: Former Smoker    Types: Cigarettes    Quit date: 06/30/2005  . Smokeless tobacco: Never Used     Comment: electronic cigarettes PRN  .  Alcohol Use: No     Comment: quit: 2007  . Drug Use: No     Comment: occasional  . Sexual Activity: Yes   Other Topics Concern  . Not on file   Social History Narrative   Patient lives at home with her spouse.   Caffeine Use: 5-7 caffeine drinks daily   Review of Systems  Musculoskeletal: Positive for myalgias, arthralgias, neck pain and neck stiffness. Negative for back pain and joint swelling.  Skin: Negative for rash and wound.  Neurological: Negative for dizziness, weakness, numbness and headaches.  Psychiatric/Behavioral: Negative for suicidal ideas.       Objective:   Physical Exam  Constitutional: She is oriented to person, place, and time. She appears well-developed and well-nourished. No distress.  HENT:  Head: Normocephalic and atraumatic.  Eyes: EOM are normal. Pupils are equal, round, and reactive to light.  Neck: Neck supple.  Cardiovascular: Normal rate, regular rhythm and normal heart sounds.  Exam reveals no gallop and no friction rub.   No murmur heard. Pulmonary/Chest: Effort normal and breath sounds normal. No respiratory distress. She has no wheezes.  Musculoskeletal: Normal range  of motion.  Right shoulder: slight spasms with right paraspinals and right trapezius, slight tenderness of right AC, negative cross-over, pain with lift-off but no weakness, pain with empty can but no weakness, negative drop-arm test  C-spine: no midline tenderness of c-spine, decreased extension, lacking approximately 10 degrees in rotation with radiation of pain to top of her shoulder but not down to her arm  Neurological: She is alert and oriented to person, place, and time.  Skin: Skin is warm and dry.  Psychiatric: She has a normal mood and affect. Her behavior is normal.  Nursing note and vitals reviewed.   Filed Vitals:   01/02/16 1548 01/02/16 1557  BP: 140/91 130/86  Pulse: 90   Temp: 98.8 F (37.1 C)   TempSrc: Oral   Resp: 16   Height: 5' 3.75" (1.619 m)   Weight: 331 lb 12.8 oz (150.503 kg)   SpO2: 98%    Dg Shoulder Right  01/02/2016  CLINICAL DATA:  For 6 weeks of right shoulder pain without recent injury. The patient had similar symptoms after a fall striking the right arm in September 2016 EXAM: RIGHT SHOULDER - 2+ VIEW COMPARISON:  Chest x-ray of July 19, 2015 FINDINGS: The bones of the right shoulder are adequately mineralized. The glenohumeral and AC joints appear normal. The subacromial subdeltoid space is normal. No acute fracture or dislocation is observed. The observed portions of the right clavicle and upper right ribs are normal. IMPRESSION: There is no acute or significant chronic bony abnormality of the right shoulder. Electronically Signed   By: David  [place] M.D.   On: 01/02/2016 16:55      Assessment & Plan:   Alicia Lucero is a 55 y.o. female Pain in joint of right shoulder - Plan: DG Shoulder Right  Impingement syndrome of right shoulder - Plan: DG Shoulder Right Suspected impingement/rotator cuff tendinosis of right shoulder.   -try initial physical therapy, then if not improving, consider subacromial injection, MRI, or ortho eval.   -lowest  effective dose of ibuprofen, and short term use as cardiac risks discussed.   -Short course of hydrocodone if needed for breakthrough pain given. RTC precautions.  Meds ordered this encounter  Medications  . OVER THE COUNTER MEDICATION    Sig: Tumeric supplement taking daily  . GLUCOSA-CHONDR-NA CHONDR-MSM PO    Sig: Take  by mouth.  Marland Kitchen. HYDROcodone-acetaminophen (NORCO/VICODIN) 5-325 MG tablet    Sig: Take 1 tablet by mouth every 6 (six) hours as needed for moderate pain.    Dispense:  20 tablet    Refill:  0   Patient Instructions       IF you received an x-ray today, you will receive an invoice from [facility]. Please contact [facility] at [phone] with questions or concerns regarding your invoice.   IF you received labwork today, you will receive an invoice from [facility]. Please contact [facility] at [phone] with questions or concerns regarding your invoice.   Our billing staff will not be able to assist you with questions regarding bills from these companies.  You will be contacted with the lab results as soon as they are available. The fastest way to get your results is to activate your My Chart account. Instructions are located on the last page of this paperwork. If you have not heard from us regarding the results in 2 weeks, please contact this office.    Physical therapy may help your shoulder and I provided other info and exercises below. Hydrocodone only if needed for breakthrough pain. Short term ibuprofen up to 600mg  every 6 hours as needed, but persistent use of this medicine can increase risk of heart issues. If not improving in next few weeks, consider another injection, MRI or orthopaedic evaluation. Sooner if worse.   Impingement Syndrome, Rotator Cuff, Bursitis With Rehab Impingement syndrome is a condition that involves inflammation of the tendons of the rotator cuff and the subacromial bursa, that causes pain in  the shoulder. The rotator cuff consists of four tendons and muscles that control much of the shoulder and upper arm function. The subacromial bursa is a fluid filled sac that helps reduce friction between the rotator cuff and one of the bones of the shoulder (acromion). Impingement syndrome is usually an overuse injury that causes swelling of the bursa (bursitis), swelling of the tendon (tendonitis), and/or a tear of the tendon (strain). Strains are classified into three categories. Grade 1 strains cause pain, but the tendon is not lengthened. Grade 2 strains include a lengthened ligament, due to the ligament being stretched or partially ruptured. With grade 2 strains there is still function, although the function may be decreased. Grade 3 strains include a complete tear of the tendon or muscle, and function is usually impaired. SYMPTOMS   Pain around the shoulder, often at the outer portion of the upper arm.  Pain that gets worse with shoulder function, especially when reaching overhead or lifting.  Sometimes, aching when not using the arm.  Pain that wakes you up at night.  Sometimes, tenderness, swelling, warmth, or redness over the affected area.  Loss of strength.  Limited motion of the shoulder, especially reaching behind the back (to the back pocket or to unhook bra) or across your body.  Crackling sound (crepitation) when moving the arm.  Biceps tendon pain and inflammation (in the front of the shoulder). Worse when bending the elbow or lifting. CAUSES  Impingement syndrome is often an overuse injury, in which chronic (repetitive) motions cause the tendons or bursa to become inflamed. A strain occurs when a force is paced on the tendon or muscle that is greater than it can withstand. Common mechanisms of injury include: Stress from sudden increase in duration, frequency, or intensity of training.  Direct hit (trauma) to the shoulder.  Aging, erosion of the tendon with normal  use.  Bony bump on shoulder (acromial spur). RISK INCREASES WITH:  Contact sports (football, wrestling, boxing).  Throwing sports (baseball, tennis, volleyball).  Weightlifting and bodybuilding.  Heavy labor.  Previous injury to the rotator cuff, including impingement.  Poor shoulder strength and flexibility.  Failure to warm up properly before activity.  Inadequate protective equipment.  Old age.  Bony bump on shoulder (acromial spur). PREVENTION   Warm up and stretch properly before activity.  Allow for adequate recovery between workouts.  Maintain physical fitness:  Strength, flexibility, and endurance.  Cardiovascular fitness.  Learn and use proper exercise technique. PROGNOSIS  If treated properly, impingement syndrome usually goes away within 6 weeks. Sometimes surgery is required.  RELATED COMPLICATIONS   Longer healing time if not properly treated, or if not given enough time to heal.  Recurring symptoms, that result in a chronic condition.  Shoulder stiffness, frozen shoulder, or loss of motion.  Rotator cuff tendon tear.  Recurring symptoms, especially if activity is resumed too soon, with overuse, with a direct blow, or when using poor technique. TREATMENT  Treatment first involves the use of ice and medicine, to reduce pain and inflammation. The use of strengthening and stretching exercises may help reduce pain with activity. These exercises may be performed at home or with a therapist. If non-surgical treatment is unsuccessful after more than 6 months, surgery may be advised. After surgery and rehabilitation, activity is usually possible in 3 months.  MEDICATION  If pain medicine is needed, nonsteroidal anti-inflammatory medicines (aspirin and ibuprofen), or other minor pain relievers (acetaminophen), are often advised.  Do not take pain medicine for 7 days before surgery.  Prescription pain relievers may be given, if your caregiver thinks they  are needed. Use only as directed and only as much as you need.  Corticosteroid injections may be given by your caregiver. These injections should be reserved for the most serious cases, because they may only be given a certain number of times. HEAT AND COLD  Cold treatment (icing) should be applied for 10 to 15 minutes every 2 to 3 hours for inflammation and pain, and immediately after activity that aggravates your symptoms. Use ice packs or an ice massage.  Heat treatment may be used before performing stretching and strengthening activities prescribed by your caregiver, physical therapist, or athletic trainer. Use a heat pack or a warm water soak. SEEK MEDICAL CARE IF:   Symptoms get worse or do not improve in 4 to 6 weeks, despite treatment.  New, unexplained symptoms develop. (Drugs used in treatment may produce side effects.) EXERCISES  RANGE OF MOTION (ROM) AND STRETCHING EXERCISES - Impingement Syndrome (Rotator Cuff  Tendinitis, Bursitis) These exercises may help you when beginning to rehabilitate your injury. Your symptoms may go away with or without further involvement from your physician, physical therapist or athletic trainer. While completing these exercises, remember:   Restoring tissue flexibility helps normal motion to return to the joints. This allows healthier, less painful movement and activity.  An effective stretch should be held for at least 30 seconds.  A stretch should never be painful. You should only feel a gentle lengthening or release in the stretched tissue. STRETCH - Flexion, Standing  Stand with good posture. With an underhand grip on your right / left hand, and an overhand grip on the opposite hand, grasp a broomstick or cane so that your hands are a little more than shoulder width apart.  Keeping your right / left elbow straight and shoulder muscles relaxed, push  the stick with your opposite hand, to raise your right / left arm in front of your body and then  overhead. Raise your arm until you feel a stretch in your right / left shoulder, but before you have increased shoulder pain.  Try to avoid shrugging your right / left shoulder as your arm rises, by keeping your shoulder blade tucked down and toward your mid-back spine. Hold for __________ seconds.  Slowly return to the starting position. Repeat __________ times. Complete this exercise __________ times per day. STRETCH - Abduction, Supine  Lie on your back. With an underhand grip on your right / left hand and an overhand grip on the opposite hand, grasp a broomstick or cane so that your hands are a little more than shoulder width apart.  Keeping your right / left elbow straight and your shoulder muscles relaxed, push the stick with your opposite hand, to raise your right / left arm out to the side of your body and then overhead. Raise your arm until you feel a stretch in your right / left shoulder, but before you have increased shoulder pain.  Try to avoid shrugging your right / left shoulder as your arm rises, by keeping your shoulder blade tucked down and toward your mid-back spine. Hold for __________ seconds.  Slowly return to the starting position. Repeat __________ times. Complete this exercise __________ times per day. ROM - Flexion, Active-Assisted  Lie on your back. You may bend your knees for comfort.  Grasp a broomstick or cane so your hands are about shoulder width apart. Your right / left hand should grip the end of the stick, so that your hand is positioned "thumbs-up," as if you were about to shake hands.  Using your healthy arm to lead, raise your right / left arm overhead, until you feel a gentle stretch in your shoulder. Hold for __________ seconds.  Use the stick to assist in returning your right / left arm to its starting position. Repeat __________ times. Complete this exercise __________ times per day.  ROM - Internal Rotation, Supine   Lie on your back on a firm  surface. Place your right / left elbow about 60 degrees away from your side. Elevate your elbow with a folded towel, so that the elbow and shoulder are the same height.  Using a broomstick or cane and your strong arm, pull your right / left hand toward your body until you feel a gentle stretch, but no increase in your shoulder pain. Keep your shoulder and elbow in place throughout the exercise.  Hold for __________ seconds. Slowly return to the starting position. Repeat __________ times. Complete this exercise __________ times per day. STRETCH - Internal Rotation  Place your right / left hand behind your back, palm up.  Throw a towel or belt over your opposite shoulder. Grasp the towel with your right / left hand.  While keeping an upright posture, gently pull up on the towel, until you feel a stretch in the front of your right / left shoulder.  Avoid shrugging your right / left shoulder as your arm rises, by keeping your shoulder blade tucked down and toward your mid-back spine.  Hold for __________ seconds. Release the stretch, by lowering your healthy hand. Repeat __________ times. Complete this exercise __________ times per day. ROM - Internal Rotation   Using an underhand grip, grasp a stick behind your back with both hands.  While standing upright with good posture, slide the stick up your back  until you feel a mild stretch in the front of your shoulder.  Hold for __________ seconds. Slowly return to your starting position. Repeat __________ times. Complete this exercise __________ times per day.  STRETCH - Posterior Shoulder Capsule   Stand or sit with good posture. Grasp your right / left elbow and draw it across your chest, keeping it at the same height as your shoulder.  Pull your elbow, so your upper arm comes in closer to your chest. Pull until you feel a gentle stretch in the back of your shoulder.  Hold for __________ seconds. Repeat __________ times. Complete this  exercise __________ times per day. STRENGTHENING EXERCISES - Impingement Syndrome (Rotator Cuff Tendinitis, Bursitis) These exercises may help you when beginning to rehabilitate your injury. They may resolve your symptoms with or without further involvement from your physician, physical therapist or athletic trainer. While completing these exercises, remember:  Muscles can gain both the endurance and the strength needed for everyday activities through controlled exercises.  Complete these exercises as instructed by your physician, physical therapist or athletic trainer. Increase the resistance and repetitions only as guided.  You may experience muscle soreness or fatigue, but the pain or discomfort you are trying to eliminate should never worsen during these exercises. If this pain does get worse, stop and make sure you are following the directions exactly. If the pain is still present after adjustments, discontinue the exercise until you can discuss the trouble with your clinician.  During your recovery, avoid activity or exercises which involve actions that place your injured hand or elbow above your head or behind your back or head. These positions stress the tissues which you are trying to heal. STRENGTH - Scapular Depression and Adduction   With good posture, sit on a firm chair. Support your arms in front of you, with pillows, arm rests, or on a table top. Have your elbows in line with the sides of your body.  Gently draw your shoulder blades down and toward your mid-back spine. Gradually increase the tension, without tensing the muscles along the top of your shoulders and the back of your neck.  Hold for __________ seconds. Slowly release the tension and relax your muscles completely before starting the next repetition.  After you have practiced this exercise, remove the arm support and complete the exercise in standing as well as sitting position. Repeat __________ times. Complete this  exercise __________ times per day.  STRENGTH - Shoulder Abductors, Isometric  With good posture, stand or sit about 4-6 inches from a wall, with your right / left side facing the wall.  Bend your right / left elbow. Gently press your right / left elbow into the wall. Increase the pressure gradually, until you are pressing as hard as you can, without shrugging your shoulder or increasing any shoulder discomfort.  Hold for __________ seconds.  Release the tension slowly. Relax your shoulder muscles completely before you begin the next repetition. Repeat __________ times. Complete this exercise __________ times per day.  STRENGTH - External Rotators, Isometric  Keep your right / left elbow at your side and bend it 90 degrees.  Step into a door frame so that the outside of your right / left wrist can press against the door frame without your upper arm leaving your side.  Gently press your right / left wrist into the door frame, as if you were trying to swing the back of your hand away from your stomach. Gradually increase the tension, until you  are pressing as hard as you can, without shrugging your shoulder or increasing any shoulder discomfort.  Hold for __________ seconds.  Release the tension slowly. Relax your shoulder muscles completely before you begin the next repetition. Repeat __________ times. Complete this exercise __________ times per day.  STRENGTH - Supraspinatus   Stand or sit with good posture. Grasp a __________ weight, or an exercise band or tubing, so that your hand is "thumbs-up," like you are shaking hands.  Slowly lift your right / left arm in a "V" away from your thigh, diagonally into the space between your side and straight ahead. Lift your hand to shoulder height or as far as you can, without increasing any shoulder pain. At first, many people do not lift their hands above shoulder height.  Avoid shrugging your right / left shoulder as your arm rises, by keeping  your shoulder blade tucked down and toward your mid-back spine.  Hold for __________ seconds. Control the descent of your hand, as you slowly return to your starting position. Repeat __________ times. Complete this exercise __________ times per day.  STRENGTH - External Rotators  Secure a rubber exercise band or tubing to a fixed object (table, pole) so that it is at the same height as your right / left elbow when you are standing or sitting on a firm surface.  Stand or sit so that the secured exercise band is at your uninjured side.  Bend your right / left elbow 90 degrees. Place a folded towel or small pillow under your right / left arm, so that your elbow is a few inches away from your side.  Keeping the tension on the exercise band, pull it away from your body, as if pivoting on your elbow. Be sure to keep your body steady, so that the movement is coming only from your rotating shoulder.  Hold for __________ seconds. Release the tension in a controlled manner, as you return to the starting position. Repeat __________ times. Complete this exercise __________ times per day.  STRENGTH - Internal Rotators   Secure a rubber exercise band or tubing to a fixed object (table, pole) so that it is at the same height as your right / left elbow when you are standing or sitting on a firm surface.  Stand or sit so that the secured exercise band is at your right / left side.  Bend your elbow 90 degrees. Place a folded towel or small pillow under your right / left arm so that your elbow is a few inches away from your side.  Keeping the tension on the exercise band, pull it across your body, toward your stomach. Be sure to keep your body steady, so that the movement is coming only from your rotating shoulder.  Hold for __________ seconds. Release the tension in a controlled manner, as you return to the starting position. Repeat __________ times. Complete this exercise __________ times per day.    STRENGTH - Scapular Protractors, Standing   Stand arms length away from a wall. Place your hands on the wall, keeping your elbows straight.  Begin by dropping your shoulder blades down and toward your mid-back spine.  To strengthen your protractors, keep your shoulder blades down, but slide them forward on your rib cage. It will feel as if you are lifting the back of your rib cage away from the wall. This is a subtle motion and can be challenging to complete. Ask your caregiver for further instruction, if you are not sure  you are doing the exercise correctly.  Hold for __________ seconds. Slowly return to the starting position, resting the muscles completely before starting the next repetition. Repeat __________ times. Complete this exercise __________ times per day. STRENGTH - Scapular Protractors, Supine  Lie on your back on a firm surface. Extend your right / left arm straight into the air while holding a __________ weight in your hand.  Keeping your head and back in place, lift your shoulder off the floor.  Hold for __________ seconds. Slowly return to the starting position, and allow your muscles to relax completely before starting the next repetition. Repeat __________ times. Complete this exercise __________ times per day. STRENGTH - Scapular Protractors, Quadruped  Get onto your hands and knees, with your shoulders directly over your hands (or as close as you can be, comfortably).  Keeping your elbows locked, lift the back of your rib cage up into your shoulder blades, so your mid-back rounds out. Keep your neck muscles relaxed.  Hold this position for __________ seconds. Slowly return to the starting position and allow your muscles to relax completely before starting the next repetition. Repeat __________ times. Complete this exercise __________ times per day.  STRENGTH - Scapular Retractors  Secure a rubber exercise band or tubing to a fixed object (table, pole), so that it is  at the height of your shoulders when you are either standing, or sitting on a firm armless chair.  With a palm down grip, grasp an end of the band in each hand. Straighten your elbows and lift your hands straight in front of you, at shoulder height. Step back, away from the secured end of the band, until it becomes tense.  Squeezing your shoulder blades together, draw your elbows back toward your sides, as you bend them. Keep your upper arms lifted away from your body throughout the exercise.  Hold for __________ seconds. Slowly ease the tension on the band, as you reverse the directions and return to the starting position. Repeat __________ times. Complete this exercise __________ times per day. STRENGTH - Shoulder Extensors   Secure a rubber exercise band or tubing to a fixed object (table, pole) so that it is at the height of your shoulders when you are either standing, or sitting on a firm armless chair.  With a thumbs-up grip, grasp an end of the band in each hand. Straighten your elbows and lift your hands straight in front of you, at shoulder height. Step back, away from the secured end of the band, until it becomes tense.  Squeezing your shoulder blades together, pull your hands down to the sides of your thighs. Do not allow your hands to go behind you.  Hold for __________ seconds. Slowly ease the tension on the band, as you reverse the directions and return to the starting position. Repeat __________ times. Complete this exercise __________ times per day.  STRENGTH - Scapular Retractors and External Rotators   Secure a rubber exercise band or tubing to a fixed object (table, pole) so that it is at the height as your shoulders, when you are either standing, or sitting on a firm armless chair.  With a palm down grip, grasp an end of the band in each hand. Bend your elbows 90 degrees and lift your elbows to shoulder height, at your sides. Step back, away from the secured end of the band,  until it becomes tense.  Squeezing your shoulder blades together, rotate your shoulders so that your upper arms and elbows  remain stationary, but your fists travel upward to head height.  Hold for __________ seconds. Slowly ease the tension on the band, as you reverse the directions and return to the starting position. Repeat __________ times. Complete this exercise __________ times per day.  STRENGTH - Scapular Retractors and External Rotators, Rowing   Secure a rubber exercise band or tubing to a fixed object (table, pole) so that it is at the height of your shoulders, when you are either standing, or sitting on a firm armless chair.  With a palm down grip, grasp an end of the band in each hand. Straighten your elbows and lift your hands straight in front of you, at shoulder height. Step back, away from the secured end of the band, until it becomes tense.  Step 1: Squeeze your shoulder blades together. Bending your elbows, draw your hands to your chest, as if you are rowing a boat. At the end of this motion, your hands and elbow should be at shoulder height and your elbows should be out to your sides.  Step 2: Rotate your shoulders, to raise your hands above your head. Your forearms should be vertical and your upper arms should be horizontal.  Hold for __________ seconds. Slowly ease the tension on the band, as you reverse the directions and return to the starting position. Repeat __________ times. Complete this exercise __________ times per day.  STRENGTH - Scapular Depressors  Find a sturdy chair without wheels, such as a dining room chair.  Keeping your feet on the floor, and your hands on the chair arms, lift your bottom up from the seat, and lock your elbows.  Keeping your elbows straight, allow gravity to pull your body weight down. Your shoulders will rise toward your ears.  Raise your body against gravity by drawing your shoulder blades down your back, shortening the distance  between your shoulders and ears. Although your feet should always maintain contact with the floor, your feet should progressively support less body weight, as you get stronger.  Hold for __________ seconds. In a controlled and slow manner, lower your body weight to begin the next repetition. Repeat __________ times. Complete this exercise __________ times per day.    This information is not intended to replace advice given to you by your health care provider. Make sure you discuss any questions you have with your health care provider.   Document Released: 08/25/2005 Document Revised: 09/15/2014 Document Reviewed: 12/07/2008 Elsevier Interactive Patient Education [employer]      I personally performed the services described in this documentation, which was scribed in my presence. The recorded information has been reviewed and considered, and addended by me as needed.

## 2016-01-06 ENCOUNTER — Other Ambulatory Visit: Payer: Self-pay | Admitting: Family Medicine

## 2016-01-06 DIAGNOSIS — F3131 Bipolar disorder, current episode depressed, mild: Secondary | ICD-10-CM

## 2016-01-06 DIAGNOSIS — F4323 Adjustment disorder with mixed anxiety and depressed mood: Secondary | ICD-10-CM

## 2016-01-07 ENCOUNTER — Other Ambulatory Visit: Payer: Self-pay | Admitting: Physician Assistant

## 2016-01-07 DIAGNOSIS — F4323 Adjustment disorder with mixed anxiety and depressed mood: Secondary | ICD-10-CM

## 2016-01-07 MED ORDER — CLONAZEPAM 1 MG PO TABS: ORAL_TABLET | ORAL | Status: DC

## 2016-01-07 NOTE — Telephone Encounter (Signed)
SHe will need to come back and see Dr. Milus GlazierLauenstein for her full refill.  I have supplied a three day course.  Deliah BostonMichael Layken Beg, MS, PA-C 5:17 PM, 01/07/2016

## 2016-01-07 NOTE — Telephone Encounter (Signed)
Pt calling for refill on Clonazepam. She is totally out. She states the pharmacy sent it a week ago. Can someone refill for Dr. Milus GlazierLauenstein.

## 2016-01-08 ENCOUNTER — Other Ambulatory Visit: Payer: Self-pay | Admitting: Family Medicine

## 2016-01-08 MED ORDER — CLONAZEPAM 1 MG PO TABS: ORAL_TABLET | ORAL | Status: DC

## 2016-01-08 MED ORDER — LAMOTRIGINE 200 MG PO TABS: 400.0000 mg | ORAL_TABLET | Freq: Every evening | ORAL | Status: DC

## 2016-01-08 MED ORDER — SERTRALINE HCL 100 MG PO TABS: 200.0000 mg | ORAL_TABLET | Freq: Every day | ORAL | Status: DC

## 2016-01-08 NOTE — Telephone Encounter (Signed)
Called pt to make sure she was aware a 3 day RF was sent. She was aware, and asked that I go ahead and send a RF req to Dr L for clonazepam, zoloft and lamictal (she may have 1 more RF of this at pharm, but would be best to go ahead and send RFs of all 3). I will pend each for 6 mos for Dr L's review. Pt had high praise for Dr L, and great sadness that he is retiring (but happy for him).

## 2016-01-10 NOTE — Telephone Encounter (Signed)
Pt CB from missed call and I notified her of her RFs. She asked that I call pharm to make sure they have clonazepam RF because someone here had told her she needed to p/up the paper Rx. I called RF into pharm.

## 2016-01-10 NOTE — Telephone Encounter (Signed)
Tried to

## 2016-01-10 NOTE — Telephone Encounter (Signed)
Tried to LM to notify of 6 mos of RFs sent in for all 3 meds, but VM was full.

## 2016-01-24 ENCOUNTER — Other Ambulatory Visit: Payer: Self-pay | Admitting: Family Medicine

## 2016-01-28 ENCOUNTER — Telehealth: Payer: Self-pay

## 2016-01-28 ENCOUNTER — Other Ambulatory Visit: Payer: Self-pay | Admitting: Family Medicine

## 2016-01-28 NOTE — Telephone Encounter (Signed)
Patient wants a call from Dr. Milus GlazierLauenstein. She states we've been sending the wrong dosage amount for her medication and it hasn't been helping her.  Metformin is supposed to be 1,000 MG twice daily and Klonopin is 2MG  3 times dailey. Please advise!  [facility] [address] [phone]

## 2016-02-01 NOTE — Telephone Encounter (Signed)
Advised pt to RTC 

## 2016-02-07 ENCOUNTER — Other Ambulatory Visit: Payer: Self-pay | Admitting: Family Medicine

## 2016-02-11 ENCOUNTER — Ambulatory Visit (INDEPENDENT_AMBULATORY_CARE_PROVIDER_SITE_OTHER): Payer: BLUE CROSS/BLUE SHIELD | Admitting: Family Medicine

## 2016-02-11 VITALS — BP 138/92 | HR 115 | Temp 98.3°F | Resp 15 | Ht 63.75 in | Wt 336.0 lb

## 2016-02-11 DIAGNOSIS — K219 Gastro-esophageal reflux disease without esophagitis: Secondary | ICD-10-CM | POA: Diagnosis not present

## 2016-02-11 DIAGNOSIS — F329 Major depressive disorder, single episode, unspecified: Secondary | ICD-10-CM | POA: Diagnosis not present

## 2016-02-11 DIAGNOSIS — E1169 Type 2 diabetes mellitus with other specified complication: Secondary | ICD-10-CM | POA: Diagnosis not present

## 2016-02-11 DIAGNOSIS — Z794 Long term (current) use of insulin: Secondary | ICD-10-CM | POA: Diagnosis not present

## 2016-02-11 DIAGNOSIS — E1140 Type 2 diabetes mellitus with diabetic neuropathy, unspecified: Secondary | ICD-10-CM

## 2016-02-11 DIAGNOSIS — R809 Proteinuria, unspecified: Secondary | ICD-10-CM | POA: Diagnosis not present

## 2016-02-11 DIAGNOSIS — J4530 Mild persistent asthma, uncomplicated: Secondary | ICD-10-CM

## 2016-02-11 DIAGNOSIS — Z1159 Encounter for screening for other viral diseases: Secondary | ICD-10-CM | POA: Diagnosis not present

## 2016-02-11 DIAGNOSIS — F32A Depression, unspecified: Secondary | ICD-10-CM

## 2016-02-11 DIAGNOSIS — F319 Bipolar disorder, unspecified: Secondary | ICD-10-CM

## 2016-02-11 DIAGNOSIS — I10 Essential (primary) hypertension: Secondary | ICD-10-CM | POA: Diagnosis not present

## 2016-02-11 DIAGNOSIS — N3941 Urge incontinence: Secondary | ICD-10-CM | POA: Diagnosis not present

## 2016-02-11 DIAGNOSIS — F3131 Bipolar disorder, current episode depressed, mild: Secondary | ICD-10-CM

## 2016-02-11 DIAGNOSIS — E785 Hyperlipidemia, unspecified: Secondary | ICD-10-CM

## 2016-02-11 LAB — COMPREHENSIVE METABOLIC PANEL
ALBUMIN: 4.1 g/dL (ref 3.6–5.1)
ALT: 12 U/L (ref 6–29)
AST: 12 U/L (ref 10–35)
Alkaline Phosphatase: 109 U/L (ref 33–130)
BILIRUBIN TOTAL: 0.3 mg/dL (ref 0.2–1.2)
BUN: 17 mg/dL (ref 7–25)
CALCIUM: 9 mg/dL (ref 8.6–10.4)
CHLORIDE: 101 mmol/L (ref 98–110)
CO2: 26 mmol/L (ref 20–31)
CREATININE: 0.73 mg/dL (ref 0.50–1.05)
Glucose, Bld: 196 mg/dL — ABNORMAL HIGH (ref 65–99)
Potassium: 4 mmol/L (ref 3.5–5.3)
SODIUM: 136 mmol/L (ref 135–146)
TOTAL PROTEIN: 6.8 g/dL (ref 6.1–8.1)

## 2016-02-11 LAB — POCT URINALYSIS DIP (MANUAL ENTRY)
BILIRUBIN UA: NEGATIVE
Glucose, UA: NEGATIVE
Ketones, POC UA: NEGATIVE
LEUKOCYTES UA: NEGATIVE
NITRITE UA: NEGATIVE
PH UA: 5
PROTEIN UA: NEGATIVE
RBC UA: NEGATIVE
Spec Grav, UA: 1.015
UROBILINOGEN UA: 0.2

## 2016-02-11 LAB — HEPATITIS C ANTIBODY: HCV AB: NEGATIVE

## 2016-02-11 LAB — POC MICROSCOPIC URINALYSIS (UMFC): Mucus: ABSENT

## 2016-02-11 LAB — LIPID PANEL
CHOL/HDL RATIO: 3.3 ratio (ref <=5.0)
Cholesterol: 221 mg/dL — ABNORMAL HIGH (ref 125–200)
HDL: 67 mg/dL (ref >=46)
LDL CALC: 113 mg/dL (ref <130)
Triglycerides: 206 mg/dL — ABNORMAL HIGH (ref <150)
VLDL: 41 mg/dL — AB (ref <30)

## 2016-02-11 LAB — POCT GLYCOSYLATED HEMOGLOBIN (HGB A1C): HEMOGLOBIN A1C: 9

## 2016-02-11 LAB — TSH: TSH: 1.92 m[IU]/L

## 2016-02-11 LAB — MICROALBUMIN, URINE: Microalb, Ur: 0.3 mg/dL

## 2016-02-11 LAB — GLUCOSE, POCT (MANUAL RESULT ENTRY): POC GLUCOSE: 191 mg/dL — AB (ref 70–99)

## 2016-02-11 MED ORDER — SERTRALINE HCL 100 MG PO TABS: 200.0000 mg | ORAL_TABLET | Freq: Every day | ORAL | Status: DC

## 2016-02-11 MED ORDER — GABAPENTIN 300 MG PO CAPS: ORAL_CAPSULE | ORAL | Status: DC

## 2016-02-11 MED ORDER — ATORVASTATIN CALCIUM 10 MG PO TABS: 10.0000 mg | ORAL_TABLET | Freq: Every day | ORAL | Status: DC

## 2016-02-11 MED ORDER — LISINOPRIL 5 MG PO TABS: 5.0000 mg | ORAL_TABLET | Freq: Every day | ORAL | Status: DC

## 2016-02-11 MED ORDER — CLONAZEPAM 2 MG PO TABS: ORAL_TABLET | ORAL | Status: DC

## 2016-02-11 MED ORDER — RANITIDINE HCL 150 MG PO CAPS: 150.0000 mg | ORAL_CAPSULE | Freq: Two times a day (BID) | ORAL | Status: DC

## 2016-02-11 MED ORDER — TRAZODONE HCL 100 MG PO TABS: 100.0000 mg | ORAL_TABLET | Freq: Every day | ORAL | Status: DC

## 2016-02-11 MED ORDER — LAMOTRIGINE 200 MG PO TABS: 400.0000 mg | ORAL_TABLET | Freq: Every evening | ORAL | Status: AC

## 2016-02-11 MED ORDER — INSULIN GLARGINE 300 UNIT/ML ~~LOC~~ SOPN: 45.0000 [IU] | PEN_INJECTOR | Freq: Every day | SUBCUTANEOUS | Status: DC

## 2016-02-11 MED ORDER — METFORMIN HCL ER 500 MG PO TB24: 1000.0000 mg | ORAL_TABLET | Freq: Two times a day (BID) | ORAL | Status: DC

## 2016-02-11 MED ORDER — LIRAGLUTIDE 18 MG/3ML ~~LOC~~ SOPN: PEN_INJECTOR | SUBCUTANEOUS | Status: DC

## 2016-02-11 NOTE — Progress Notes (Signed)
Subjective:    Patient ID: Alicia Lucero, female    DOB: [birth year], 55 y.o.   MRN: [id number]  02/11/2016  Medication Refill; Diabetes; and fall screening completed   HPI This 55 y.o. female presents for three month follow-up:  1.  DMII: Patient reports good compliance with medication, good tolerance to medication, and good symptom control.  Sugars 200 fasting; baseline.  Running really high; not been eating well; not exercising.  Metformin rx decreased to 500mg  bid.  Was on 1000mg  bid. Had recurrent yeast infections with Jardiance.  40 units of insulin; Victoza 1.8.  Previous Zocor use; Lisinopril for kidney.  Last eye exam 2 years ago.  Dr. Caryn Section.    2. Bipolar disorder I: Patient reports good compliance with medication, good tolerance to medication, and good symptom control.    Klonopin decreased from 2mg  to 1mg  lat month; does not know why Dr. Milus Glazier decreased dose last month.  Lamictal, Zoloft, Tegretol, Klonopin.  Has seen Plovsky, Page Nolen Mu, not sure of others.  In 2005, admitted for one week.  Saul Fordyce, NP.  Another NP who left.  Ran out of Klonopin a few days ago. Rapid cycling bipolar.  Has been stable for years on current regimen.    3.  PCOS/menorrhagia/infertility: having hot flashes and perimenopausal symptoms.  4. Urinary incontinence: onset.  No stress incontinence; occurs when washing hands.  5.  R shoulder pain: shoulder is better; having some R neck pain.  S/p physical therapy.  Does housecleaning.  Helps out sister who has MS.  KNees have pain a lot; taking glucosamine.    6. Asthma: admission in 2016 for pneumonia; +chilhood asthma; [facility] at [facility].  Diagnosed with asthma in 2016; only flares with acute illness; rarely uses Albuterol.   7. GERD: Patient reports good compliance with medication, good tolerance to medication, and good symptom control.    Pap smear 2016 Mody 12/26/14 Mammogram 2015 Colonoscopy never; referred to Dr. Loreta Ave several times.     Review of Systems  Constitutional: Negative for fever, chills, diaphoresis and fatigue.  Eyes: Negative for visual disturbance.  Respiratory: Negative for cough and shortness of breath.   Cardiovascular: Negative for chest pain, palpitations and leg swelling.  Gastrointestinal: Negative for nausea, vomiting, abdominal pain, diarrhea and constipation.  Endocrine: Negative for cold intolerance, heat intolerance, polydipsia, polyphagia and polyuria.  Genitourinary: Negative for dysuria and hematuria.  Musculoskeletal: Positive for arthralgias.  Neurological: Negative for dizziness, tremors, seizures, syncope, facial asymmetry, speech difficulty, weakness, light-headedness, numbness and headaches.  Psychiatric/Behavioral: Positive for dysphoric mood. Negative for suicidal ideas, sleep disturbance and self-injury. The patient is nervous/anxious.     History reviewed. No pertinent past medical history. Past Surgical History  Procedure Laterality Date  . Cholecystectomy  1996  . Tonsillectomy      as a child  . Knee arthroscopy Left 2008  . Dilation and curettage of uterus      secondary to menorrhagia  . Wisdom tooth extraction     Allergies  Allergen Reactions  . Empagliflozin Other (See Comments)    Causes yeast infection  . Empagliflozin-Linagliptin Other (See Comments)    Causes yeast infection  . Other Nausea Only    States she can't tolerate seafood smell  . Oxycodone     Sleepy, nausea, diarrhea, vomiting    Social History   Social History  . Marital Status: Married    Spouse Name: [name]  . Number of Children: 0  . Years of Education: Assoc.  Occupational History  . housekeeping    Social History Main Topics  . Smoking status: Former Smoker    Types: Cigarettes    Quit date: 06/30/2005  . Smokeless tobacco: Never Used     Comment: electronic cigarettes PRN  . Alcohol Use: No     Comment: quit: 2007  . Drug Use: No     Comment: occasional  . Sexual  Activity: Yes    Birth Control/ Protection: None     Comment: PCOS/chronic infertility   Other Topics Concern  . Not on file   Social History Narrative   Patient lives at home with her spouse.   Caffeine Use: 5-7 caffeine drinks daily   Family History  Problem Relation Age of Onset  . Cancer Mother 42    lung  . Mental illness Mother     bipolar mania  . Hypertension Mother   . Cancer Father     liver  . Hypertension Father   . Heart disease Father 40    CAD/CABG  . Multiple sclerosis Sister   . Hypertension Sister   . Diabetes Sister        Objective:    BP 138/92 mmHg  Pulse 115  Temp(Src) 98.3 F (36.8 C) (Oral)  Resp 15  Ht 5' 3.75" (1.619 m)  Wt 336 lb (152.409 kg)  BMI 58.15 kg/m2  SpO2 95%  LMP 01/29/2016 Physical Exam  Constitutional: She is oriented to person, place, and time. She appears well-developed and well-nourished. No distress.  HENT:  Head: Normocephalic and atraumatic.  Right Ear: External ear normal.  Left Ear: External ear normal.  Nose: Nose normal.  Mouth/Throat: Oropharynx is clear and moist.  Eyes: Conjunctivae and EOM are normal. Pupils are equal, round, and reactive to light.  Neck: Normal range of motion. Neck supple. Carotid bruit is not present. No thyromegaly present.  Cardiovascular: Normal rate, regular rhythm, normal heart sounds and intact distal pulses.  Exam reveals no gallop and no friction rub.   No murmur heard. Pulmonary/Chest: Effort normal and breath sounds normal. She has no wheezes. She has no rales.  Abdominal: Soft. Bowel sounds are normal. She exhibits no distension and no mass. There is no tenderness. There is no rebound and no guarding.  Lymphadenopathy:    She has no cervical adenopathy.  Neurological: She is alert and oriented to person, place, and time. No cranial nerve deficit.  Skin: Skin is warm and dry. No rash noted. She is not diaphoretic. No erythema. No pallor.  Psychiatric: She has a normal mood  and affect. Her behavior is normal.        Assessment & Plan:   1. Type 2 diabetes mellitus with diabetic neuropathy, with long-term current use of insulin (HCC)   2. Benign essential HTN   3. Depression   4. Dyslipidemia associated with type 2 diabetes mellitus (HCC)   5. Bipolar 1 disorder (HCC)   6. Asthma, chronic, mild persistent, uncomplicated   7. Need for hepatitis C screening test   8. Urge incontinence of urine   9. Bipolar affective disorder, currently depressed, mild (HCC)   10. Gastroesophageal reflux disease without esophagitis   11. Proteinuria   12. Controlled type 2 diabetes mellitus without complication, with long-term current use of insulin (HCC)   13. Controlled type 2 diabetes mellitus without complication, without long-term current use of insulin (HCC)   14. Adjustment disorder with mixed anxiety and depressed mood     Orders Placed This Encounter  Procedures  . Urine culture  . Comprehensive metabolic panel    Order Specific Question:  Has the patient fasted?    Answer:  Yes  . Lipid panel    Order Specific Question:  Has the patient fasted?    Answer:  Yes  . TSH  . Hepatitis C antibody  . Microalbumin, urine  . POCT glucose (manual entry)  . POCT glycosylated hemoglobin (Hb A1C)  . POCT urinalysis dipstick  . POCT Microscopic Urinalysis ([facility])   Meds ordered this encounter  Medications  . metFORMIN (GLUCOPHAGE-XR) 500 MG 24 hr tablet    Sig: Take 2 tablets (1,000 mg total) by mouth 2 (two) times daily.    Dispense:  120 tablet    Refill:  11  . atorvastatin (LIPITOR) 10 MG tablet    Sig: Take 1 tablet (10 mg total) by mouth daily.    Dispense:  30 tablet    Refill:  5  . Liraglutide (VICTOZA) 18 MG/3ML SOPN    Sig: INJECT 0.3 MILLILITERS (1.8 MG) INTO THE SKIN DAILY    Dispense:  9 pen    Refill:  3  . traZODone (DESYREL) 100 MG tablet    Sig: Take 1 tablet (100 mg total) by mouth at bedtime.    Dispense:  90 tablet    Refill:  1  .  sertraline (ZOLOFT) 100 MG tablet    Sig: Take 2 tablets (200 mg total) by mouth daily.    Dispense:  180 tablet    Refill:  1  . ranitidine (ZANTAC) 150 MG capsule    Sig: Take 1 capsule (150 mg total) by mouth 2 (two) times daily.    Dispense:  180 capsule    Refill:  3  . lisinopril (PRINIVIL,ZESTRIL) 5 MG tablet    Sig: Take 1 tablet (5 mg total) by mouth daily.    Dispense:  90 tablet    Refill:  3  . lamoTRIgine (LAMICTAL) 200 MG tablet    Sig: Take 2 tablets (400 mg total) by mouth every evening.    Dispense:  180 tablet    Refill:  1  . Insulin Glargine (TOUJEO SOLOSTAR) 300 UNIT/ML SOPN    Sig: Inject 45 Units into the skin daily.    Dispense:  1.5 mL    Refill:  11  . gabapentin (NEURONTIN) 300 MG capsule    Sig: TAKE 2 CAPSULES BY MOUTH THREE TIMES A DAY    Dispense:  540 capsule    Refill:  1  . clonazePAM (KLONOPIN) 2 MG tablet    Sig: TAKE 1 TABLET BY MOUTH 3 TIMES DAILY AS NEEDED    Dispense:  90 tablet    Refill:  5    Return in about 3 months (around 05/13/2016) for recheck.    Briselda Naval Paulita Fujita, M.D. [facility] [address] [place], [place]  [address] [phone] phone [phone] fax

## 2016-02-11 NOTE — Patient Instructions (Signed)
     IF you received an x-ray today, you will receive an invoice from [facility]. Please contact  Radiology at [phone] with questions or concerns regarding your invoice.   IF you received labwork today, you will receive an invoice from [facility]. Please contact [facility] at [phone] with questions or concerns regarding your invoice.   Our billing staff will not be able to assist you with questions regarding bills from these companies.  You will be contacted with the lab results as soon as they are available. The fastest way to get your results is to activate your My Chart account. Instructions are located on the last page of this paperwork. If you have not heard from us regarding the results in 2 weeks, please contact this office.      

## 2016-02-12 LAB — URINE CULTURE
COLONY COUNT: NO GROWTH
Organism ID, Bacteria: NO GROWTH

## 2016-02-25 ENCOUNTER — Encounter: Payer: Self-pay | Admitting: Family Medicine

## 2016-03-04 ENCOUNTER — Ambulatory Visit (INDEPENDENT_AMBULATORY_CARE_PROVIDER_SITE_OTHER): Payer: BLUE CROSS/BLUE SHIELD | Admitting: Family Medicine

## 2016-03-04 ENCOUNTER — Encounter: Payer: Self-pay | Admitting: Family Medicine

## 2016-03-04 VITALS — BP 118/72 | HR 97 | Temp 98.7°F | Resp 16 | Ht 65.0 in | Wt 335.2 lb

## 2016-03-04 DIAGNOSIS — F3131 Bipolar disorder, current episode depressed, mild: Secondary | ICD-10-CM | POA: Diagnosis not present

## 2016-03-04 DIAGNOSIS — J4530 Mild persistent asthma, uncomplicated: Secondary | ICD-10-CM | POA: Diagnosis not present

## 2016-03-04 DIAGNOSIS — Z6841 Body Mass Index (BMI) 40.0 and over, adult: Secondary | ICD-10-CM

## 2016-03-04 DIAGNOSIS — H6981 Other specified disorders of Eustachian tube, right ear: Secondary | ICD-10-CM | POA: Diagnosis not present

## 2016-03-04 DIAGNOSIS — Z794 Long term (current) use of insulin: Secondary | ICD-10-CM

## 2016-03-04 DIAGNOSIS — F319 Bipolar disorder, unspecified: Secondary | ICD-10-CM

## 2016-03-04 DIAGNOSIS — I10 Essential (primary) hypertension: Secondary | ICD-10-CM | POA: Diagnosis not present

## 2016-03-04 DIAGNOSIS — E1140 Type 2 diabetes mellitus with diabetic neuropathy, unspecified: Secondary | ICD-10-CM | POA: Diagnosis not present

## 2016-03-04 MED ORDER — CARBAMAZEPINE 200 MG PO TABS: 200.0000 mg | ORAL_TABLET | Freq: Four times a day (QID) | ORAL | Status: DC

## 2016-03-04 MED ORDER — FLUTICASONE PROPIONATE 50 MCG/ACT NA SUSP: 2.0000 | Freq: Two times a day (BID) | NASAL | Status: DC

## 2016-03-04 NOTE — Patient Instructions (Addendum)
     IF you received an x-ray today, you will receive an invoice from [facility]. Please contact [facility] at [phone] with questions or concerns regarding your invoice.   IF you received labwork today, you will receive an invoice from [facility]. Please contact [facility] at [phone] with questions or concerns regarding your invoice.   Our billing staff will not be able to assist you with questions regarding bills from these companies.  You will be contacted with the lab results as soon as they are available. The fastest way to get your results is to activate your My Chart account. Instructions are located on the last page of this paperwork. If you have not heard from us regarding the results in 2 weeks, please contact this office.    Barotitis Media Barotitis media is inflammation of your middle ear. This occurs when the auditory tube (eustachian tube) leading from the back of your nose (nasopharynx) to your eardrum is blocked. This blockage may result from a cold, environmental allergies, or an upper respiratory infection. Unresolved barotitis media may lead to damage or hearing loss (barotrauma), which may become permanent. HOME CARE INSTRUCTIONS   Use medicines as recommended by your health care provider. Over-the-counter medicines will help unblock the canal and can help during times of air travel.  Do not put anything into your ears to clean or unplug them. Eardrops will not be helpful.  Do not swim, dive, or fly until your health care provider says it is all right to do so. If these activities are necessary, chewing gum with frequent, forceful swallowing may help. It is also helpful to hold your nose and gently blow to pop your ears for equalizing pressure changes. This forces air into the eustachian tube.  Only take over-the-counter or prescription medicines for pain, discomfort, or fever as directed by your health care  provider.  A decongestant may be helpful in decongesting the middle ear and make pressure equalization easier. SEEK MEDICAL CARE IF:  You experience a serious form of dizziness in which you feel as if the room is spinning and you feel nauseated (vertigo).  Your symptoms only involve one ear. SEEK IMMEDIATE MEDICAL CARE IF:   You develop a severe headache, dizziness, or severe ear pain.  You have bloody or pus-like drainage from your ears.  You develop a fever.  Your problems do not improve or become worse. MAKE SURE YOU:   Understand these instructions.  Will watch your condition.  Will get help right away if you are not doing well or get worse.   This information is not intended to replace advice given to you by your health care provider. Make sure you discuss any questions you have with your health care provider.   Document Released: 08/22/2000 Document Revised: 06/15/2013 Document Reviewed: 03/22/2013 Elsevier Interactive Patient Education 2016 [employer]  

## 2016-03-04 NOTE — Progress Notes (Signed)
Subjective:    Patient ID: Alicia Lucero, female    DOB: 1961-06-15, 55 y.o.   MRN: [id number]  03/04/2016  Establish Care; Otalgia; and Depression   HPI This 55 y.o. female presents for evaluation of R ear pain.  Has been swimming with sister; does not go under the water.  Intermittent; onset four days ago. Aching.    Bipolar disorder: called [facility]; needs referral per office.  Has tried Wellbutrin, Lexapro, Celexa, previously managed by Dr. Evelene Croon.  Previous Prozac; previous Abilify.  Has also stopped all of it and taking OTC medications.  Manic comes out as severe anger.  Grief causes anger.  Ruined relationships with death.  That level is anger is hard on body.  Having a lot of night sweats; insomnia due to hot flashes; mild urinary incontinence; not ready for Ditropan.    DMII: has avoided [employer].  Provides care to in laws; got grilled chicken salad.  Did not eat after 7:00pm.  Sugars high in mornings.  Not a big eater during the day. Takign Troujeo.  Menorrhagia: for two days, has heavy menses. Awoke and felt really nauseated.  Then got up around 7:30am; had small amount of bleeding . Within two hours, going through three pads.  Horribly heavy menses. Gynecologist recommended 3 Ibuprofen with onset.  Does not want an IUD.  Did want to perform biopsy, would be willing.  Long narrow vagina; very difficult pelvic exam. Gynecology performed pap smear.   Review of Systems  Constitutional: Negative for fever, chills, diaphoresis and fatigue.  HENT: Positive for ear pain. Negative for congestion, ear discharge, facial swelling, postnasal drip, rhinorrhea, sneezing, sore throat and voice change.   Eyes: Negative for visual disturbance.  Respiratory: Negative for cough and shortness of breath.   Cardiovascular: Negative for chest pain, palpitations and leg swelling.  Gastrointestinal: Negative for nausea, vomiting, abdominal pain, diarrhea and constipation.  Endocrine:  Negative for cold intolerance, heat intolerance, polydipsia, polyphagia and polyuria.  Genitourinary: Positive for menstrual problem.  Neurological: Negative for dizziness, tremors, seizures, syncope, facial asymmetry, speech difficulty, weakness, light-headedness, numbness and headaches.  Psychiatric/Behavioral: Positive for dysphoric mood. Negative for suicidal ideas, sleep disturbance and self-injury. The patient is nervous/anxious.     No past medical history on file. Past Surgical History  Procedure Laterality Date  . Cholecystectomy  1996  . Tonsillectomy      as a child  . Knee arthroscopy Left 2008  . Dilation and curettage of uterus      secondary to menorrhagia  . Wisdom tooth extraction     Allergies  Allergen Reactions  . Empagliflozin Other (See Comments)    Causes yeast infection  . Empagliflozin-Linagliptin Other (See Comments)    Causes yeast infection  . Other Nausea Only    States she can't tolerate seafood smell  . Oxycodone     Sleepy, nausea, diarrhea, vomiting    Social History   Social History  . Marital Status: Married    Spouse Name: [name]  . Number of Children: 0  . Years of Education: Assoc.   Occupational History  . housekeeping    Social History Main Topics  . Smoking status: Former Smoker    Types: Cigarettes    Quit date: 06/30/2005  . Smokeless tobacco: Never Used     Comment: electronic cigarettes PRN  . Alcohol Use: No     Comment: quit: 2007  . Drug Use: No     Comment: occasional  . Sexual Activity:  Yes    Birth Control/ Protection: None     Comment: PCOS/chronic infertility   Other Topics Concern  . Not on file   Social History Narrative   Patient lives at home with her spouse.   Caffeine Use: 5-7 caffeine drinks daily   Family History  Problem Relation Age of Onset  . Cancer Mother 78    lung  . Mental illness Mother     bipolar mania  . Hypertension Mother   . Cancer Father     liver  . Hypertension Father     . Heart disease Father 51    CAD/CABG  . Multiple sclerosis Sister   . Hypertension Sister   . Diabetes Sister        Objective:    BP 118/72 mmHg  Pulse 97  Temp(Src) 98.7 F (37.1 C) (Oral)  Resp 16  Ht 5\' 5"  (1.651 m)  Wt 335 lb 3.2 oz (152.046 kg)  BMI 55.78 kg/m2  SpO2 94%  LMP 02/28/2016 Physical Exam  Constitutional: She is oriented to person, place, and time. She appears well-developed and well-nourished. No distress.  HENT:  Head: Normocephalic and atraumatic.  Right Ear: Tympanic membrane, external ear and ear canal normal.  Left Ear: Tympanic membrane, external ear and ear canal normal.  Nose: Nose normal.  Mouth/Throat: Uvula is midline, oropharynx is clear and moist and mucous membranes are normal.  Eyes: Conjunctivae and EOM are normal. Pupils are equal, round, and reactive to light.  Neck: Normal range of motion. Neck supple. Carotid bruit is not present. No thyromegaly present.  Cardiovascular: Normal rate, regular rhythm, normal heart sounds and intact distal pulses.  Exam reveals no gallop and no friction rub.   No murmur heard. Pulmonary/Chest: Effort normal and breath sounds normal. She has no wheezes. She has no rales.  Abdominal: Soft. Bowel sounds are normal. She exhibits no distension and no mass. There is no tenderness. There is no rebound and no guarding.  Lymphadenopathy:    She has no cervical adenopathy.  Neurological: She is alert and oriented to person, place, and time. No cranial nerve deficit.  Skin: Skin is warm and dry. No rash noted. She is not diaphoretic. No erythema. No pallor.  Psychiatric: She has a normal mood and affect. Her behavior is normal. Judgment and thought content normal.  tearful   Results for orders placed or performed in visit on 02/11/16  Urine culture  Result Value Ref Range   Colony Count NO GROWTH    Organism ID, Bacteria NO GROWTH   Comprehensive metabolic panel  Result Value Ref Range   Sodium 136 135 - 146  mmol/L   Potassium 4.0 3.5 - 5.3 mmol/L   Chloride 101 98 - 110 mmol/L   CO2 26 20 - 31 mmol/L   Glucose, Bld 196 (H) 65 - 99 mg/dL   BUN 17 7 - 25 mg/dL   Creat 5.36 6.44 - 0.34 mg/dL   Total Bilirubin 0.3 0.2 - 1.2 mg/dL   Alkaline Phosphatase 109 33 - 130 U/L   AST 12 10 - 35 U/L   ALT 12 6 - 29 U/L   Total Protein 6.8 6.1 - 8.1 g/dL   Albumin 4.1 3.6 - 5.1 g/dL   Calcium 9.0 8.6 - 74.2 mg/dL  Lipid panel  Result Value Ref Range   Cholesterol 221 (H) 125 - 200 mg/dL   Triglycerides 595 (H) <150 mg/dL   HDL 67 >=63 mg/dL   Total CHOL/HDL Ratio  3.3 <=5.0 Ratio   VLDL 41 (H) <30 mg/dL   LDL Cholesterol 952 <841 mg/dL  TSH  Result Value Ref Range   TSH 1.92 mIU/L  Hepatitis C antibody  Result Value Ref Range   HCV Ab NEGATIVE NEGATIVE  Microalbumin, urine  Result Value Ref Range   Microalb, Ur 0.3 Not estab mg/dL  POCT glucose (manual entry)  Result Value Ref Range   POC Glucose 191 (A) 70 - 99 mg/dl  POCT glycosylated hemoglobin (Hb A1C)  Result Value Ref Range   Hemoglobin A1C 9.0   POCT urinalysis dipstick  Result Value Ref Range   Color, UA yellow yellow   Clarity, UA clear clear   Glucose, UA negative negative   Bilirubin, UA negative negative   Ketones, POC UA negative negative   Spec Grav, UA 1.015    Blood, UA negative negative   pH, UA 5.0    Protein Ur, POC negative negative   Urobilinogen, UA 0.2    Nitrite, UA Negative Negative   Leukocytes, UA Negative Negative  POCT Microscopic Urinalysis ([facility])  Result Value Ref Range   WBC,UR,HPF,POC None None WBC/hpf   RBC,UR,HPF,POC None None RBC/hpf   Bacteria None None, Too numerous to count   Mucus Absent Absent   Epithelial Cells, UR Per Microscopy Few (A) None, Too numerous to count cells/hpf       Assessment & Plan:   1. Bipolar 1 disorder (HCC)   2. Benign essential HTN   3. Asthma, chronic, mild persistent, uncomplicated   4. Type 2 diabetes mellitus with diabetic neuropathy, with long-term  current use of insulin (HCC)   5. Morbid obesity with BMI of 50.0-59.9, adult (HCC)   6. Bipolar affective disorder, currently depressed, mild (HCC)   7. Eustachian tube dysfunction, right    -increase Tegretol to 200mg  four times daily; refer to psychiatry. -add Flonase for Eustachian Tube Dysfunction.   Orders Placed This Encounter  Procedures  . Ambulatory referral to Psychiatry    Referral Priority:  Routine    Referral Type:  Psychiatric    Referral Reason:  Specialty Services Required    Requested Specialty:  Psychiatry    Number of Visits Requested:  1   Meds ordered this encounter  Medications  . Calcium-Magnesium-Vitamin D (CALCIUM 500 PO)    Sig: Take by mouth daily.  . carbamazepine (TEGRETOL) 200 MG tablet    Sig: Take 1 tablet (200 mg total) by mouth 4 (four) times daily.    Dispense:  120 tablet    Refill:  5  . fluticasone (FLONASE) 50 MCG/ACT nasal spray    Sig: Place 2 sprays into both nostrils 2 (two) times daily.    Dispense:  16 g    Refill:  6    No Follow-up on file.    Jenson Beedle Paulita Fujita, M.D. [facility] [address] [place], [place]  [address] [phone] phone [phone] fax

## 2016-03-25 ENCOUNTER — Telehealth: Payer: Self-pay

## 2016-03-25 NOTE — Telephone Encounter (Signed)
Pt was prescribed atorvastatin (LIPITOR) 10 MG tablet [[id number] and the dose for the lisinopril (PRINIVIL,ZESTRIL) 5 MG tablet [[id number] was increased from what she's been taking. She states that every since she has been taking these two medications she has been feeling exhausted til the point of being barely able to get out of bed with 12 hrs of sleep. Please call patient with information as to what can be done for this.

## 2016-03-28 NOTE — Telephone Encounter (Signed)
Recommend HOLDING Atorvastatin 10mg  daily for next month.  Continue increased dose of Lisinopril.  After one month, recommend RESTARTING Atorvastatin 10mg  but start with 1/2 tablet daily for one month and then increase to one tablet daily of Atorvastatin.  Both medications can cause fatigue/exhaustion; thus, taking both at one time is likely cause of fatigue. If we slowly introduce both medications over the next three months, I think she will adjust well to both of them.

## 2016-03-31 NOTE — Telephone Encounter (Signed)
Pt advised. Pt understood. 

## 2016-04-17 ENCOUNTER — Encounter: Payer: Self-pay | Admitting: Obstetrics & Gynecology

## 2016-06-17 ENCOUNTER — Ambulatory Visit: Payer: BLUE CROSS/BLUE SHIELD | Admitting: Family Medicine

## 2016-06-25 ENCOUNTER — Ambulatory Visit (INDEPENDENT_AMBULATORY_CARE_PROVIDER_SITE_OTHER): Payer: BLUE CROSS/BLUE SHIELD | Admitting: Family Medicine

## 2016-06-25 ENCOUNTER — Ambulatory Visit (INDEPENDENT_AMBULATORY_CARE_PROVIDER_SITE_OTHER): Payer: BLUE CROSS/BLUE SHIELD

## 2016-06-25 ENCOUNTER — Encounter: Payer: Self-pay | Admitting: Family Medicine

## 2016-06-25 VITALS — BP 124/82 | HR 78 | Temp 98.3°F | Resp 18 | Ht 65.0 in | Wt 347.4 lb

## 2016-06-25 DIAGNOSIS — R801 Persistent proteinuria, unspecified: Secondary | ICD-10-CM | POA: Diagnosis not present

## 2016-06-25 DIAGNOSIS — I10 Essential (primary) hypertension: Secondary | ICD-10-CM | POA: Diagnosis not present

## 2016-06-25 DIAGNOSIS — Z794 Long term (current) use of insulin: Secondary | ICD-10-CM | POA: Diagnosis not present

## 2016-06-25 DIAGNOSIS — M25562 Pain in left knee: Secondary | ICD-10-CM | POA: Diagnosis not present

## 2016-06-25 DIAGNOSIS — M5431 Sciatica, right side: Secondary | ICD-10-CM

## 2016-06-25 DIAGNOSIS — E1140 Type 2 diabetes mellitus with diabetic neuropathy, unspecified: Secondary | ICD-10-CM | POA: Diagnosis not present

## 2016-06-25 DIAGNOSIS — E1169 Type 2 diabetes mellitus with other specified complication: Secondary | ICD-10-CM | POA: Diagnosis not present

## 2016-06-25 DIAGNOSIS — E785 Hyperlipidemia, unspecified: Secondary | ICD-10-CM

## 2016-06-25 DIAGNOSIS — Z1211 Encounter for screening for malignant neoplasm of colon: Secondary | ICD-10-CM | POA: Diagnosis not present

## 2016-06-25 DIAGNOSIS — F319 Bipolar disorder, unspecified: Secondary | ICD-10-CM | POA: Diagnosis not present

## 2016-06-25 LAB — CBC WITH DIFFERENTIAL/PLATELET
BASOS PCT: 0 %
Basophils Absolute: 0 cells/uL (ref 0–200)
EOS PCT: 4 %
Eosinophils Absolute: 452 cells/uL (ref 15–500)
HEMATOCRIT: 38.7 % (ref 35.0–45.0)
HEMOGLOBIN: 12.6 g/dL (ref 11.7–15.5)
LYMPHS ABS: 2373 {cells}/uL (ref 850–3900)
Lymphocytes Relative: 21 %
MCH: 28.3 pg (ref 27.0–33.0)
MCHC: 32.6 g/dL (ref 32.0–36.0)
MCV: 87 fL (ref 80.0–100.0)
MONO ABS: 678 {cells}/uL (ref 200–950)
MPV: 9 fL (ref 7.5–12.5)
Monocytes Relative: 6 %
NEUTROS ABS: 7797 {cells}/uL (ref 1500–7800)
Neutrophils Relative %: 69 %
Platelets: 378 10*3/uL (ref 140–400)
RBC: 4.45 MIL/uL (ref 3.80–5.10)
RDW: 13.6 % (ref 11.0–15.0)
WBC: 11.3 10*3/uL — AB (ref 3.8–10.8)

## 2016-06-25 LAB — LIPID PANEL
Cholesterol: 206 mg/dL — ABNORMAL HIGH (ref 125–200)
HDL: 60 mg/dL (ref >=46)
LDL CALC: 119 mg/dL (ref <130)
Total CHOL/HDL Ratio: 3.4 Ratio (ref <=5.0)
Triglycerides: 136 mg/dL (ref <150)
VLDL: 27 mg/dL (ref <30)

## 2016-06-25 LAB — COMPREHENSIVE METABOLIC PANEL
ALBUMIN: 3.8 g/dL (ref 3.6–5.1)
ALK PHOS: 91 U/L (ref 33–130)
ALT: 12 U/L (ref 6–29)
AST: 15 U/L (ref 10–35)
BILIRUBIN TOTAL: 0.3 mg/dL (ref 0.2–1.2)
BUN: 12 mg/dL (ref 7–25)
CALCIUM: 9 mg/dL (ref 8.6–10.4)
CO2: 24 mmol/L (ref 20–31)
CREATININE: 0.74 mg/dL (ref 0.50–1.05)
Chloride: 105 mmol/L (ref 98–110)
GLUCOSE: 178 mg/dL — AB (ref 65–99)
Potassium: 4.5 mmol/L (ref 3.5–5.3)
SODIUM: 138 mmol/L (ref 135–146)
Total Protein: 6.7 g/dL (ref 6.1–8.1)

## 2016-06-25 LAB — POCT GLYCOSYLATED HEMOGLOBIN (HGB A1C): HEMOGLOBIN A1C: 7.5

## 2016-06-25 MED ORDER — LISINOPRIL 5 MG PO TABS: 5.0000 mg | ORAL_TABLET | Freq: Every day | ORAL | 3 refills | Status: DC

## 2016-06-25 MED ORDER — INSULIN GLARGINE 300 UNIT/ML ~~LOC~~ SOPN: 50.0000 [IU] | PEN_INJECTOR | Freq: Every day | SUBCUTANEOUS | 12 refills | Status: DC

## 2016-06-25 MED ORDER — INSULIN GLARGINE 300 UNIT/ML ~~LOC~~ SOPN: 50.0000 [IU] | PEN_INJECTOR | Freq: Every day | SUBCUTANEOUS | 3 refills | Status: DC

## 2016-06-25 MED ORDER — FLUTICASONE PROPIONATE 50 MCG/ACT NA SUSP: 2.0000 | Freq: Two times a day (BID) | NASAL | 11 refills | Status: DC

## 2016-06-25 MED ORDER — CLONAZEPAM 2 MG PO TABS: ORAL_TABLET | ORAL | 3 refills | Status: DC

## 2016-06-25 MED ORDER — SIMVASTATIN 20 MG PO TABS: 20.0000 mg | ORAL_TABLET | Freq: Every day | ORAL | 3 refills | Status: DC

## 2016-06-25 MED ORDER — LIRAGLUTIDE 18 MG/3ML ~~LOC~~ SOPN: PEN_INJECTOR | SUBCUTANEOUS | 3 refills | Status: DC

## 2016-06-25 MED ORDER — METHYLPREDNISOLONE SODIUM SUCC 125 MG IJ SOLR
80.0000 mg | Freq: Once | INTRAMUSCULAR | Status: AC
  Administered 2016-06-25: 80 mg via INTRAMUSCULAR

## 2016-06-25 NOTE — Progress Notes (Signed)
Subjective:    Patient ID: Alicia Lucero, female    DOB: 1961-06-04, 55 y.o.   MRN: [id number]  06/25/2016  Follow-up (diabetes follow up ) .  HPI This 55 y.o. female presents for four month follow-up of the following:   1.  L knee pain: giving out; really bothering patient.  Taking a lot of Ibuprofen.  Requesting coritsone injection.  Swells.  No injury.  Onset 2 months ago.  Sometimes it does not hurt; unsteady gait; more pain with walking barefooted.  Carpeted floor.  Buys new shoes every two months. Has insoles in them.  No recent physical therapy for L knee; last physical therapy was eight months ago.  S/p arthroscopic surgery in L knee without improvement. No current physical therapy.  Last injection 2 years ago.   2.  R sciatica: requesting cortisone injection.  Has been bothering some lately; chronic intermittent recurrent issue.  +radiation into R leg; No n/t/w.  No saddle paresthesias.  No b/b dysfunction.  3.  DMII: sugar 154; not testing much; has been really compliant with medication.  S/p eye exam.  Taking Lisinopril.  Took Zocor in past; could not tolerate Lipitor.  Tolerated Simvastatin.  Took Trejeo 50 units qhs.  Forgets Victoza 8-9 times per month.    4.  Bipolar II disorder: returned to [facility].  Does not have severe mania; suffers more with hypomania; then alternates to depression.  Tried a couple of different medications that did not work; within the last three months; one was [place]; psychiatrist will not prescribe Clonazepam.  That is the only thing that works for sleep; tried Lunesta, Trazodone, hydroxyzine.  Clonazepam is the only thing.  Never takes more than prescribed.  Has been maintained on Clonazepam for years.  Needs it.  She wants to manage Zoloft; doubled Neurontin and Lamictal.  Taking Klonopin 2 at night; was taking 3 at night; fine with tapering down.  Has been taking 2 Klonopin at night; sleeping well with 2 Klonopin.  No sedation the following  day. With the other medications, unable to wake up.  Caregives for sister.  Willing to work to taper down.    Wt Readings from Last 3 Encounters:  06/25/16 (!) 347 lb 6.4 oz (157.6 kg)  03/04/16 (!) 335 lb 3.2 oz (152 kg)  02/11/16 (!) 336 lb (152.4 kg)    Going to the beach next week alone to get away from husband and sister.  When at the beach, wants to jump start with protein shake.  15 grams of protein and 10 grams of carbohydrates.  When alone, can lose weight well.  Husband wants to eat healthy.  Inlaws live on same property; they do not eat well.  Unless cooks separately for husband and pt; husband does not drive.  Can control eating.  Went to gynecology; s/p biopsy; negative biopsy; [facility] rx; 1.5 weeks ago, started bleeding with headache in L periorbital region. Offered IUD; declined IUD.  Does not want anything inside of me.   Requesting referral to gastroenterology.  Review of Systems  Constitutional: Negative for chills, diaphoresis, fatigue and fever.  Eyes: Negative for visual disturbance.  Respiratory: Negative for cough and shortness of breath.   Cardiovascular: Negative for chest pain, palpitations and leg swelling.  Gastrointestinal: Negative for abdominal pain, constipation, diarrhea, nausea and vomiting.  Endocrine: Negative for cold intolerance, heat intolerance, polydipsia, polyphagia and polyuria.  Musculoskeletal: Positive for arthralgias, back pain, gait problem and joint swelling.  Neurological: Positive for headaches.  Negative for dizziness, tremors, seizures, syncope, facial asymmetry, speech difficulty, weakness, light-headedness and numbness.  Psychiatric/Behavioral: Positive for dysphoric mood. Negative for self-injury and suicidal ideas. The patient is nervous/anxious.     Past Medical History:  Diagnosis Date  . Allergy   . Asthma   . Bipolar 1 disorder (HCC)   . Diabetes mellitus without complication (HCC)   . GERD (gastroesophageal reflux disease)     . Hyperlipidemia    Past Surgical History:  Procedure Laterality Date  . CHOLECYSTECTOMY  1996  . DILATION AND CURETTAGE OF UTERUS     secondary to menorrhagia  . KNEE ARTHROSCOPY Left 2008  . TONSILLECTOMY     as a child  . WISDOM TOOTH EXTRACTION     Allergies  Allergen Reactions  . Empagliflozin Other (See Comments)    Causes yeast infection  . Empagliflozin-Linagliptin Other (See Comments)    Causes yeast infection  . Other Nausea Only    States she can't tolerate seafood smell  . Oxycodone     Sleepy, nausea, diarrhea, vomiting    Social History   Social History  . Marital status: Married    Spouse name: [name]  . Number of children: 0  . Years of education: Assoc.   Occupational History  . housekeeping    Social History Main Topics  . Smoking status: Former Smoker    Types: Cigarettes    Quit date: 06/30/2005  . Smokeless tobacco: Never Used     Comment: electronic cigarettes PRN  . Alcohol use No     Comment: quit: 2007  . Drug use: No     Comment: occasional  . Sexual activity: Yes    Birth control/ protection: None     Comment: PCOS/chronic infertility   Other Topics Concern  . Not on file   Social History Narrative   Patient lives at home with her spouse.   Caffeine Use: 5-7 caffeine drinks daily   Family History  Problem Relation Age of Onset  . Cancer Mother 65    lung  . Mental illness Mother     bipolar mania  . Hypertension Mother   . Cancer Father     liver  . Hypertension Father   . Heart disease Father 81    CAD/CABG  . Multiple sclerosis Sister   . Hypertension Sister   . Diabetes Sister        Objective:    BP 124/82   Pulse 78   Temp 98.3 F (36.8 C) (Oral)   Resp 18   Ht 5\' 5"  (1.651 m)   Wt (!) 347 lb 6.4 oz (157.6 kg)   LMP 06/15/2016   SpO2 96%   BMI 57.81 kg/m  Physical Exam  Constitutional: She is oriented to person, place, and time. She appears well-developed and well-nourished. No distress.  HENT:   Head: Normocephalic and atraumatic.  Right Ear: External ear normal.  Left Ear: External ear normal.  Nose: Nose normal.  Mouth/Throat: Oropharynx is clear and moist.  Eyes: Conjunctivae and EOM are normal. Pupils are equal, round, and reactive to light.  Neck: Normal range of motion. Neck supple. Carotid bruit is not present. No thyromegaly present.  Cardiovascular: Normal rate, regular rhythm, normal heart sounds and intact distal pulses.  Exam reveals no gallop and no friction rub.   No murmur heard. Pulmonary/Chest: Effort normal and breath sounds normal. She has no wheezes. She has no rales.  Abdominal: Soft. Bowel sounds are normal. She exhibits no  distension and no mass. There is no tenderness. There is no rebound and no guarding.  Musculoskeletal:       Left knee: She exhibits normal range of motion, no swelling and no effusion. Tenderness found. Medial joint line and lateral joint line tenderness noted.       Lumbar back: Normal. She exhibits normal range of motion, no tenderness, no bony tenderness, no pain, no spasm and normal pulse.  Lymphadenopathy:    She has no cervical adenopathy.  Neurological: She is alert and oriented to person, place, and time. No cranial nerve deficit.  Skin: Skin is warm and dry. No rash noted. She is not diaphoretic. No erythema. No pallor.  Psychiatric: She has a normal mood and affect. Her behavior is normal.  Nursing note and vitals reviewed.  Results for orders placed or performed in visit on 06/25/16  Comprehensive metabolic panel  Result Value Ref Range   Sodium 138 135 - 146 mmol/L   Potassium 4.5 3.5 - 5.3 mmol/L   Chloride 105 98 - 110 mmol/L   CO2 24 20 - 31 mmol/L   Glucose, Bld 178 (H) 65 - 99 mg/dL   BUN 12 7 - 25 mg/dL   Creat 1.61 0.96 - 0.45 mg/dL   Total Bilirubin 0.3 0.2 - 1.2 mg/dL   Alkaline Phosphatase 91 33 - 130 U/L   AST 15 10 - 35 U/L   ALT 12 6 - 29 U/L   Total Protein 6.7 6.1 - 8.1 g/dL   Albumin 3.8 3.6 - 5.1  g/dL   Calcium 9.0 8.6 - 40.9 mg/dL  Lipid panel  Result Value Ref Range   Cholesterol 206 (H) 125 - 200 mg/dL   Triglycerides 811 <914 mg/dL   HDL 60 >=78 mg/dL   Total CHOL/HDL Ratio 3.4 <=5.0 Ratio   VLDL 27 <30 mg/dL   LDL Cholesterol 295 <621 mg/dL  CBC with Differential/Platelet  Result Value Ref Range   WBC 11.3 (H) 3.8 - 10.8 K/uL   RBC 4.45 3.80 - 5.10 MIL/uL   Hemoglobin 12.6 11.7 - 15.5 g/dL   HCT 30.8 65.7 - 84.6 %   MCV 87.0 80.0 - 100.0 fL   MCH 28.3 27.0 - 33.0 pg   MCHC 32.6 32.0 - 36.0 g/dL   RDW 96.2 95.2 - 84.1 %   Platelets 378 140 - 400 K/uL   MPV 9.0 7.5 - 12.5 fL   Neutro Abs 7,797 1,500 - 7,800 cells/uL   Lymphs Abs 2,373 850 - 3,900 cells/uL   Monocytes Absolute 678 200 - 950 cells/uL   Eosinophils Absolute 452 15 - 500 cells/uL   Basophils Absolute 0 0 - 200 cells/uL   Neutrophils Relative % 69 %   Lymphocytes Relative 21 %   Monocytes Relative 6 %   Eosinophils Relative 4 %   Basophils Relative 0 %   Smear Review Criteria for review not met   POCT glycosylated hemoglobin (Hb A1C)  Result Value Ref Range   Hemoglobin A1C 7.5        Assessment & Plan:   1. Type 2 diabetes mellitus with diabetic neuropathy, with long-term current use of insulin (HCC)   2. Dyslipidemia associated with type 2 diabetes mellitus (HCC)   3. Benign essential HTN   4. Bipolar 1 disorder (HCC)   5. Colon cancer screening   6. Right sided sciatica   7. Persistent proteinuria   8. Acute pain of left knee    -improved diabetes control. -s/p  Solumedrol injection in office for knee and R sciatica; obtain L knee films as no recent films in chart.  If no improvement in knee pain, recommend follow-up with Dr Neva Seat of Sports Medicine. Recommend rest, icing, stretches. -refer to GI for colonoscopy for colon cancer screening. -s/p gynecology consultation for menomettrorhagia. -rx for Simvastatin provided. -prescribed Klonopin 2mg  one bid; plan to decrease to 2mg  one daily  at next visit and then decrease dosage further; psychiatry will not prescribe Klonopin for patient.    Orders Placed This Encounter  Procedures  . DG Knee Complete 4 Views Left    Standing Status:   Future    Number of Occurrences:   1    Standing Expiration Date:   06/25/2017    Order Specific Question:   Reason for Exam (SYMPTOM  OR DIAGNOSIS REQUIRED)    Answer:   L knee pain, giving out, swelling, popping    Order Specific Question:   Is the patient pregnant?    Answer:   No    Order Specific Question:   Preferred imaging location?    Answer:   External  . Comprehensive metabolic panel  . Lipid panel  . CBC with Differential/Platelet  . Ambulatory referral to Gastroenterology    Referral Priority:   Routine    Referral Type:   Consultation    Referral Reason:   Specialty Services Required    Number of Visits Requested:   1  . POCT glycosylated hemoglobin (Hb A1C)   Meds ordered this encounter  Medications  . simvastatin (ZOCOR) 20 MG tablet    Sig: Take 1 tablet (20 mg total) by mouth at bedtime.    Dispense:  90 tablet    Refill:  3  . methylPREDNISolone sodium succinate (SOLU-MEDROL) 125 mg/2 mL injection 80 mg  . clonazePAM (KLONOPIN) 2 MG tablet    Sig: TAKE 1 TABLET BY MOUTH 2 TIMES DAILY AS NEEDED    Dispense:  60 tablet    Refill:  3  . fluticasone (FLONASE) 50 MCG/ACT nasal spray    Sig: Place 2 sprays into both nostrils 2 (two) times daily.    Dispense:  16 g    Refill:  11  . DISCONTD: Insulin Glargine (TOUJEO SOLOSTAR) 300 UNIT/ML SOPN    Sig: Inject 50 Units into the skin daily.    Dispense:  3 pen    Refill:  12  . liraglutide (VICTOZA) 18 MG/3ML SOPN    Sig: INJECT 0.3 MILLILITERS (1.8 MG) INTO THE SKIN DAILY    Dispense:  9 pen    Refill:  3  . Insulin Glargine (TOUJEO SOLOSTAR) 300 UNIT/ML SOPN    Sig: Inject 50 Units into the skin daily.    Dispense:  9 pen    Refill:  3  . lisinopril (PRINIVIL,ZESTRIL) 5 MG tablet    Sig: Take 1 tablet (5 mg  total) by mouth daily.    Dispense:  90 tablet    Refill:  3    Return in about 2 months (around 08/25/2016) for recheck diabetes.   Karee Christopherson Paulita Fujita, M.D. [facility] [address] [place], [place]  [address] [phone] phone [phone] fax

## 2016-06-25 NOTE — Patient Instructions (Signed)
     IF you received an x-ray today, you will receive an invoice from [facility]. Please contact [facility] at [phone] with questions or concerns regarding your invoice.   IF you received labwork today, you will receive an invoice from [facility]. Please contact [facility] at [phone] with questions or concerns regarding your invoice.   Our billing staff will not be able to assist you with questions regarding bills from these companies.  You will be contacted with the lab results as soon as they are available. The fastest way to get your results is to activate your My Chart account. Instructions are located on the last page of this paperwork. If you have not heard from us regarding the results in 2 weeks, please contact this office.      

## 2016-07-06 ENCOUNTER — Encounter: Payer: Self-pay | Admitting: Family Medicine

## 2016-07-09 ENCOUNTER — Encounter: Payer: Self-pay | Admitting: Gastroenterology

## 2016-08-08 ENCOUNTER — Telehealth: Payer: Self-pay | Admitting: *Deleted

## 2016-08-08 NOTE — Telephone Encounter (Signed)
Dr Adela LankArmbruster,  Pt is scheduled for direct screening colonoscopy 09/03/16 and PV 12/13.  During chart review I see pt weighs 347lb and has BMI of 57.9.  Hx of Diabetes, HTN, asthma, and bipolar.  Do you want pt to have OV with you before scheduling hospital colonoscopy?  Thanks, [facility]

## 2016-08-08 NOTE — Telephone Encounter (Signed)
Yes I would prefer to see her in clinic. Thanks

## 2016-08-08 NOTE — Telephone Encounter (Signed)
Attempted to call pt to make OV; no answer.  Will try later

## 2016-08-11 ENCOUNTER — Encounter: Payer: Self-pay | Admitting: Gastroenterology

## 2016-08-11 NOTE — Telephone Encounter (Signed)
Spoke with patient. Explained that Dr.Armbruster wants to see her in the office before any colonoscopy. Made appointment for Jan.24,2018 at 9 am. Patient aware, Colon & pv canceled.

## 2016-08-27 ENCOUNTER — Encounter: Payer: Self-pay | Admitting: Family Medicine

## 2016-08-27 ENCOUNTER — Ambulatory Visit (INDEPENDENT_AMBULATORY_CARE_PROVIDER_SITE_OTHER): Payer: BLUE CROSS/BLUE SHIELD | Admitting: Family Medicine

## 2016-08-27 VITALS — BP 140/64 | HR 91 | Temp 99.0°F | Resp 17 | Ht 65.0 in | Wt 342.0 lb

## 2016-08-27 DIAGNOSIS — E785 Hyperlipidemia, unspecified: Secondary | ICD-10-CM | POA: Diagnosis not present

## 2016-08-27 DIAGNOSIS — Z794 Long term (current) use of insulin: Secondary | ICD-10-CM

## 2016-08-27 DIAGNOSIS — F319 Bipolar disorder, unspecified: Secondary | ICD-10-CM

## 2016-08-27 DIAGNOSIS — R801 Persistent proteinuria, unspecified: Secondary | ICD-10-CM

## 2016-08-27 DIAGNOSIS — E1140 Type 2 diabetes mellitus with diabetic neuropathy, unspecified: Secondary | ICD-10-CM

## 2016-08-27 DIAGNOSIS — E1169 Type 2 diabetes mellitus with other specified complication: Secondary | ICD-10-CM | POA: Diagnosis not present

## 2016-08-27 DIAGNOSIS — M1712 Unilateral primary osteoarthritis, left knee: Secondary | ICD-10-CM

## 2016-08-27 DIAGNOSIS — Z6841 Body Mass Index (BMI) 40.0 and over, adult: Secondary | ICD-10-CM

## 2016-08-27 DIAGNOSIS — I10 Essential (primary) hypertension: Secondary | ICD-10-CM

## 2016-08-27 MED ORDER — MUPIROCIN 2 % EX OINT: TOPICAL_OINTMENT | CUTANEOUS | 0 refills | Status: DC

## 2016-08-27 MED ORDER — LISINOPRIL 5 MG PO TABS: 5.0000 mg | ORAL_TABLET | Freq: Every day | ORAL | 3 refills | Status: DC

## 2016-08-27 MED ORDER — NYSTATIN 100000 UNIT/GM EX CREA: 1.0000 "application " | TOPICAL_CREAM | Freq: Two times a day (BID) | CUTANEOUS | 0 refills | Status: DC

## 2016-08-27 MED ORDER — NYSTATIN 100000 UNIT/GM EX POWD: Freq: Four times a day (QID) | CUTANEOUS | 0 refills | Status: DC

## 2016-08-27 MED ORDER — CLONAZEPAM 2 MG PO TABS: ORAL_TABLET | ORAL | 3 refills | Status: DC

## 2016-08-27 NOTE — Patient Instructions (Signed)
     IF you received an x-ray today, you will receive an invoice from [facility]. Please contact [facility] at [phone] with questions or concerns regarding your invoice.   IF you received labwork today, you will receive an invoice from [facility]. Please contact [facility] at [phone] with questions or concerns regarding your invoice.   Our billing staff will not be able to assist you with questions regarding bills from these companies.  You will be contacted with the lab results as soon as they are available. The fastest way to get your results is to activate your My Chart account. Instructions are located on the last page of this paperwork. If you have not heard from us regarding the results in 2 weeks, please contact this office.     

## 2016-08-27 NOTE — Progress Notes (Signed)
Subjective:    Patient ID: Alicia Lucero, female    DOB: 01-04-1961, 55 y.o.   MRN: [id number]  08/27/2016  Follow-up (Diabetes/ chol elevated) and Medication Refill (Klonopin, duoneb, nizoral, bactroban)   HPI This 55 y.o. female presents for three month follow-up DMII, hypercholesterolemia, bipolar disorder.  Tolerating Simvastatin 20mg  daily; no side effect.  Lost some weight and gained some back.  Sugars 150s.  Lowest 86.  No lows.  Felt shaky.  Not shaky at 96.  Increased insulin to 55.  Backed down to 50units  No Tegretol.  Worked so well; was angry medicine; then when doubled dose, anger got worse; increased Gabapentin.  Eliminated Tegretol; increased Gabapentin to 600mg  ti.  Neuropathy has seemed to worsen despite increase in Gabapentin and lowering in sugar.    Seeing physical therapist for knee; PT is helping.  S/p steroid injection without improvement.  Severe OA on L knee especially medially.   S/p knee xray in 06/2016: +severe OA L knee Medial and patellofemoral joint.  Taking 2 Klonopin at night.   S/p eye exam   Immunization History  Administered Date(s) Administered  . Influenza,inj,Quad PF,36+ Mos 06/09/2013, 07/20/2014, 07/21/2015  . Influenza-Unspecified 06/08/2016  . Pneumococcal Polysaccharide-23 07/21/2015  . Pneumococcal-Unspecified 01/06/2009  . Tdap 01/13/2009   BP Readings from Last 3 Encounters:  08/27/16 140/64  06/25/16 124/82  03/04/16 118/72   Wt Readings from Last 3 Encounters:  08/27/16 (!) 342 lb (155.1 kg)  06/25/16 (!) 347 lb 6.4 oz (157.6 kg)  03/04/16 (!) 335 lb 3.2 oz (152 kg)    Review of Systems  Constitutional: Negative for chills, diaphoresis, fatigue and fever.  Eyes: Negative for visual disturbance.  Respiratory: Negative for cough and shortness of breath.   Cardiovascular: Negative for chest pain, palpitations and leg swelling.  Gastrointestinal: Negative for abdominal pain, constipation, diarrhea, nausea and vomiting.    Endocrine: Negative for cold intolerance, heat intolerance, polydipsia, polyphagia and polyuria.  Neurological: Negative for dizziness, tremors, seizures, syncope, facial asymmetry, speech difficulty, weakness, light-headedness, numbness and headaches.    Past Medical History:  Diagnosis Date  . Allergy   . Asthma   . Bipolar 1 disorder (HCC)   . Diabetes mellitus without complication (HCC)   . GERD (gastroesophageal reflux disease)   . Hyperlipidemia    Past Surgical History:  Procedure Laterality Date  . CHOLECYSTECTOMY  1996  . DILATION AND CURETTAGE OF UTERUS     secondary to menorrhagia  . KNEE ARTHROSCOPY Left 2008  . TONSILLECTOMY     as a child  . WISDOM TOOTH EXTRACTION     Allergies  Allergen Reactions  . Empagliflozin Other (See Comments)    Causes yeast infection  . Empagliflozin-Linagliptin Other (See Comments)    Causes yeast infection  . Other Nausea Only    States she can't tolerate seafood smell  . Oxycodone     Sleepy, nausea, diarrhea, vomiting    Social History   Social History  . Marital status: Married    Spouse name: [name]  . Number of children: 0  . Years of education: Assoc.   Occupational History  . housekeeping    Social History Main Topics  . Smoking status: Former Smoker    Types: Cigarettes    Quit date: 06/30/2005  . Smokeless tobacco: Never Used     Comment: electronic cigarettes PRN  . Alcohol use No     Comment: quit: 2007  . Drug use: No     Comment:  occasional  . Sexual activity: Yes    Birth control/ protection: None     Comment: PCOS/chronic infertility   Other Topics Concern  . Not on file   Social History Narrative   Patient lives at home with her spouse.   Caffeine Use: 5-7 caffeine drinks daily   Family History  Problem Relation Age of Onset  . Cancer Mother 28    lung  . Mental illness Mother     bipolar mania  . Hypertension Mother   . Cancer Father     liver  . Hypertension Father   . Heart  disease Father 46    CAD/CABG  . Multiple sclerosis Sister   . Hypertension Sister   . Diabetes Sister        Objective:    BP 140/64 (BP Location: Left Arm, Patient Position: Sitting, Cuff Size: Large)   Pulse 91   Temp 99 F (37.2 C) (Oral)   Resp 17   Ht 5\' 5"  (1.651 m)   Wt (!) 342 lb (155.1 kg)   LMP 08/27/2016   SpO2 95%   BMI 56.91 kg/m  Physical Exam  Constitutional: She is oriented to person, place, and time. She appears well-developed and well-nourished. No distress.  HENT:  Head: Normocephalic and atraumatic.  Right Ear: External ear normal.  Left Ear: External ear normal.  Nose: Nose normal.  Mouth/Throat: Oropharynx is clear and moist.  Eyes: Conjunctivae and EOM are normal. Pupils are equal, round, and reactive to light.  Neck: Normal range of motion. Neck supple. Carotid bruit is not present. No thyromegaly present.  Cardiovascular: Normal rate, regular rhythm, normal heart sounds and intact distal pulses.  Exam reveals no gallop and no friction rub.   No murmur heard. Pulmonary/Chest: Effort normal and breath sounds normal. She has no wheezes. She has no rales.  Abdominal: Soft. Bowel sounds are normal. She exhibits no distension and no mass. There is no tenderness. There is no rebound and no guarding.  Lymphadenopathy:    She has no cervical adenopathy.  Neurological: She is alert and oriented to person, place, and time. No cranial nerve deficit.  Skin: Skin is warm and dry. No rash noted. She is not diaphoretic. No erythema. No pallor.  Psychiatric: She has a normal mood and affect. Her behavior is normal.        Assessment & Plan:   1. Primary osteoarthritis of left knee   2. Bipolar 1 disorder (HCC)   3. Persistent proteinuria   4. Type 2 diabetes mellitus with diabetic neuropathy, with long-term current use of insulin (HCC)   5. Benign essential HTN   6. Dyslipidemia associated with type 2 diabetes mellitus (HCC)   7. Morbid obesity with BMI of  50.0-59.9, adult (HCC)    -decrease Klonopin to 2mg  one at qhs; continue to wean at each visit. -refills provided. -obtain labs. -continue to work on diet, exercise, low-carb and low-calorie food choices. -continue PT for osteoarthritis L knee; consider referral to ortho if no improvement; continue with weight loss.    Orders Placed This Encounter  Procedures  . CBC with Differential/Platelet  . Comprehensive metabolic panel    Order Specific Question:   Has the patient fasted?    Answer:   Yes  . Lipid panel    Order Specific Question:   Has the patient fasted?    Answer:   Yes  . Hemoglobin A1c   Meds ordered this encounter  Medications  . clonazePAM (KLONOPIN)  2 MG tablet    Sig: TAKE 1 TABLET AT BEDTIME    Dispense:  30 tablet    Refill:  3  . mupirocin ointment (BACTROBAN) 2 %    Sig: APPLY TO AFFECTED AREAS TWICE DAILY    Dispense:  22 g    Refill:  0    Yes, may substitue ointment for cream  . lisinopril (PRINIVIL,ZESTRIL) 5 MG tablet    Sig: Take 1 tablet (5 mg total) by mouth daily.    Dispense:  90 tablet    Refill:  3  . nystatin (MYCOSTATIN/NYSTOP) powder    Sig: Apply topically 4 (four) times daily.    Dispense:  60 g    Refill:  0  . nystatin cream (MYCOSTATIN)    Sig: Apply 1 application topically 2 (two) times daily.    Dispense:  30 g    Refill:  0    Return in about 3 months (around 11/25/2016) for complete physical examiniation.   Thadd Apuzzo Paulita Fujita, M.D. [facility] [address] [place], [place]  [address] [phone] phone [phone] fax

## 2016-08-28 LAB — LIPID PANEL
CHOLESTEROL TOTAL: 140 mg/dL (ref 100–199)
Chol/HDL Ratio: 2.3 ratio units (ref 0.0–4.4)
HDL: 62 mg/dL (ref >39)
LDL CALC: 63 mg/dL (ref 0–99)
TRIGLYCERIDES: 73 mg/dL (ref 0–149)
VLDL CHOLESTEROL CAL: 15 mg/dL (ref 5–40)

## 2016-08-28 LAB — COMPREHENSIVE METABOLIC PANEL
ALBUMIN: 4.2 g/dL (ref 3.5–5.5)
ALK PHOS: 98 IU/L (ref 39–117)
ALT: 14 IU/L (ref 0–32)
AST: 17 IU/L (ref 0–40)
Albumin/Globulin Ratio: 1.8 (ref 1.2–2.2)
BUN/Creatinine Ratio: 23 (ref 9–23)
BUN: 17 mg/dL (ref 6–24)
Bilirubin Total: <0.2 mg/dL (ref 0.0–1.2)
CALCIUM: 9.4 mg/dL (ref 8.7–10.2)
CO2: 23 mmol/L (ref 18–29)
CREATININE: 0.74 mg/dL (ref 0.57–1.00)
Chloride: 103 mmol/L (ref 96–106)
GFR calc Af Amer: 107 mL/min/{1.73_m2} (ref >59)
GFR, EST NON AFRICAN AMERICAN: 93 mL/min/{1.73_m2} (ref >59)
GLUCOSE: 147 mg/dL — AB (ref 65–99)
Globulin, Total: 2.4 g/dL (ref 1.5–4.5)
Potassium: 4.5 mmol/L (ref 3.5–5.2)
Sodium: 141 mmol/L (ref 134–144)
Total Protein: 6.6 g/dL (ref 6.0–8.5)

## 2016-08-28 LAB — CBC WITH DIFFERENTIAL/PLATELET
BASOS: 0 %
Basophils Absolute: 0 10*3/uL (ref 0.0–0.2)
EOS (ABSOLUTE): 0.3 10*3/uL (ref 0.0–0.4)
Eos: 3 %
Hematocrit: 38.2 % (ref 34.0–46.6)
Hemoglobin: 12.5 g/dL (ref 11.1–15.9)
IMMATURE GRANS (ABS): 0 10*3/uL (ref 0.0–0.1)
Immature Granulocytes: 0 %
Lymphocytes Absolute: 2 10*3/uL (ref 0.7–3.1)
Lymphs: 19 %
MCH: 27.5 pg (ref 26.6–33.0)
MCHC: 32.7 g/dL (ref 31.5–35.7)
MCV: 84 fL (ref 79–97)
MONOS ABS: 0.6 10*3/uL (ref 0.1–0.9)
Monocytes: 6 %
Neutrophils Absolute: 7.8 10*3/uL — ABNORMAL HIGH (ref 1.4–7.0)
Neutrophils: 72 %
Platelets: 378 10*3/uL (ref 150–379)
RBC: 4.54 x10E6/uL (ref 3.77–5.28)
RDW: 14.6 % (ref 12.3–15.4)
WBC: 10.7 10*3/uL (ref 3.4–10.8)

## 2016-08-28 LAB — HEMOGLOBIN A1C
Est. average glucose Bld gHb Est-mCnc: 148 mg/dL
Hgb A1c MFr Bld: 6.8 % — ABNORMAL HIGH (ref 4.8–5.6)

## 2016-09-03 ENCOUNTER — Encounter: Payer: BLUE CROSS/BLUE SHIELD | Admitting: Gastroenterology

## 2016-09-05 ENCOUNTER — Other Ambulatory Visit: Payer: Self-pay | Admitting: Physician Assistant

## 2016-09-08 DIAGNOSIS — M1712 Unilateral primary osteoarthritis, left knee: Secondary | ICD-10-CM | POA: Insufficient documentation

## 2016-10-01 ENCOUNTER — Ambulatory Visit: Payer: BLUE CROSS/BLUE SHIELD | Admitting: Gastroenterology

## 2016-10-28 ENCOUNTER — Encounter: Payer: Self-pay | Admitting: Family Medicine

## 2016-10-31 ENCOUNTER — Ambulatory Visit (INDEPENDENT_AMBULATORY_CARE_PROVIDER_SITE_OTHER): Payer: BLUE CROSS/BLUE SHIELD

## 2016-10-31 ENCOUNTER — Ambulatory Visit (INDEPENDENT_AMBULATORY_CARE_PROVIDER_SITE_OTHER): Payer: BLUE CROSS/BLUE SHIELD | Admitting: Physician Assistant

## 2016-10-31 VITALS — BP 118/70 | HR 73 | Temp 98.1°F | Resp 16 | Ht 63.75 in | Wt 344.4 lb

## 2016-10-31 DIAGNOSIS — M5441 Lumbago with sciatica, right side: Secondary | ICD-10-CM

## 2016-10-31 LAB — POCT URINE PREGNANCY: Preg Test, Ur: NEGATIVE

## 2016-10-31 MED ORDER — ACETAMINOPHEN-CODEINE #2 300-15 MG PO TABS: 1.0000 | ORAL_TABLET | ORAL | 0 refills | Status: DC | PRN

## 2016-10-31 MED ORDER — METHYLPREDNISOLONE ACETATE 80 MG/ML IJ SUSP
80.0000 mg | Freq: Once | INTRAMUSCULAR | Status: AC
  Administered 2016-10-31: 80 mg via INTRAMUSCULAR

## 2016-10-31 NOTE — Patient Instructions (Addendum)
For the next 5 days please monitor your blood sugar closely.  Please increase your nightly insulin by 5 units for the next five nights.  Okay to taper insulin back down at anytime if fasting sugars are less than 130.      IF you received an x-ray today, you will receive an [facility] from [facility]. Please contact [facility] at [phone] with questions or concerns regarding your invoice.   IF you received labwork today, you will receive an invoice from [facility]. Please contact [facility] at [phone] with questions or concerns regarding your invoice.   Our billing staff will not be able to assist you with questions regarding bills from these companies.  You will be contacted with the lab results as soon as they are available. The fastest way to get your results is to activate your My Chart account. Instructions are located on the last page of this paperwork. If you have not heard from us regarding the results in 2 weeks, please contact this office.

## 2016-10-31 NOTE — Progress Notes (Signed)
10/31/2016 4:28 PM   DOB: 03-10-1961 / MRN: [id number]  SUBJECTIVE:  Alicia Lucero is a 56 y.o. female presenting for a flare of her sciatica that started two days ago and is worsening. Complains of radiation of pain down the lateral leg.  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 She does not recall a precipitating maneuver.   She has had flares of this for 5-6 years now.  She has tried Ibuprofen and Aleve ("way to much" per patient) and this has not helped her thus fare.  She has a history of diabetes.  She checks her sugar in the morning and reports her sugar runs roughly at 120. She does take a long acting insulin, and she takes 50 units of long acting nightly.    She is allergic to empagliflozin; empagliflozin-linagliptin; other; and oxycodone.   She  has a past medical history of Allergy; Asthma; Bipolar 1 disorder (HCC); Diabetes mellitus without complication (HCC); GERD (gastroesophageal reflux disease); and Hyperlipidemia.    She  reports that she quit smoking about 11 years ago. Her smoking use included Cigarettes. She has never used smokeless tobacco. She reports that she does not drink alcohol or use drugs. She  reports that she currently engages in sexual activity. She reports using the following method of birth control/protection: None. The patient  has a past surgical history that includes Cholecystectomy (1996); Tonsillectomy; Knee arthroscopy (Left, 2008); Dilation and curettage of uterus; and Wisdom tooth extraction.  Her family history includes Cancer in her father; Cancer (age of onset: 3463) in her mother; Diabetes in her sister; Heart disease (age of onset: 5462) in her father; Hypertension in her father, mother, and sister; Mental illness in her mother; Multiple sclerosis in her sister.  Review of Systems  Constitutional: Negative for chills and fever.  Musculoskeletal: Positive for back pain and myalgias. Negative for falls, joint pain and  neck pain.    The problem list and medications were reviewed and updated by myself where necessary and exist elsewhere in the encounter.   OBJECTIVE:  BP 118/70   Pulse 73   Temp 98.1 F (36.7 C) (Oral)   Resp 16   Ht 5' 3.75" (1.619 m)   Wt (!) 344 lb 6.4 oz (156.2 kg)   SpO2 95%   BMI 59.58 kg/m   Physical Exam  Constitutional: She is oriented to person, place, and time.  Morbidly obese with a gynoid distribution    Cardiovascular: Normal rate.   Pulmonary/Chest: Effort normal.  Musculoskeletal: Normal range of motion. She exhibits tenderness (right paraspinal). She exhibits no edema or deformity.  Neurological: She is alert and oriented to person, place, and time. She has normal reflexes. She displays normal reflexes. No cranial nerve deficit. She exhibits normal muscle tone. Coordination normal.  Psychiatric: She has a normal mood and affect.    Lab Results  Component Value Date   HGBA1C 6.8 (H) 08/27/2016   Lab Results  Component Value Date   CREATININE 0.74 08/27/2016     Results for orders placed or performed in visit on 10/31/16 (from the past 72 hour(s))  POCT urine pregnancy     Status: None   Collection Time: 10/31/16  4:16 PM  Result Value Ref Range   Preg Test, Ur Negative Negative    Dg Lumbar Spine Complete  Result Date: 10/31/2016 CLINICAL DATA:  Pain low back pain with radicular pattern down RIGHT leg, acute RIGHT-sided low back pain  with RIGHT-sided sciatica, history diabetes mellitus, asthma EXAM: LUMBAR SPINE - COMPLETE 4+ VIEW COMPARISON:  None FINDINGS: Five non-rib-bearing lumbar vertebra. Vertebral body heights maintained without fracture or subluxation. Disc space narrowing at L1-L2 with endplate spurs. No spondylolysis or bone destruction. SI joints symmetric. Numerous pelvic phleboliths. Surgical clips RIGHT upper quadrant likely representing cholecystectomy. IMPRESSION: Mild degenerative disc disease changes at L1-L2. No acute abnormalities.  Electronically Signed   By: Ulyses Southward M.D.   On: 10/31/2016 15:59    ASSESSMENT AND PLAN:  [name] was seen today for sciatica pain.  Diagnoses and all orders for this visit:  Acute right-sided low back pain with right-sided sciatica: Well controlled diabetic with history of GERD here with acute on chronic back pain with sciatica.  She has tenderness in the right paraspinals and over the right piriformis.  She has failed NSAIDS for this flare.  Will start a short course of narcotic and will not be refilling this.  Depomedrol here and she will stop NSAIDS at home.  Increase insulin to 55 units night (see avs for anticipatory guidance).  Will see her back as needed for this.  -     DG Lumbar Spine Complete; Future -     POCT urine pregnancy -     acetaminophen-codeine (TYLENOL #2) 300-15 MG tablet; Take 1 tablet by mouth every 4 (four) hours as needed for moderate pain. -     methylPREDNISolone acetate (DEPO-MEDROL) injection 80 mg; Inject 1 mL (80 mg total) into the muscle once.    The patient is advised to call or return to clinic if she does not see an improvement in symptoms, or to seek the care of the closest emergency department if she worsens with the above plan.   Deliah Boston, MHS, PA-C [facility] 10/31/2016 4:28 PM

## 2016-11-12 ENCOUNTER — Ambulatory Visit: Payer: BLUE CROSS/BLUE SHIELD | Admitting: Family Medicine

## 2016-12-08 ENCOUNTER — Other Ambulatory Visit: Payer: Self-pay | Admitting: Family Medicine

## 2016-12-24 ENCOUNTER — Ambulatory Visit (INDEPENDENT_AMBULATORY_CARE_PROVIDER_SITE_OTHER): Payer: BLUE CROSS/BLUE SHIELD | Admitting: Family Medicine

## 2016-12-24 ENCOUNTER — Encounter: Payer: Self-pay | Admitting: Family Medicine

## 2016-12-24 VITALS — BP 107/70 | HR 82 | Temp 98.8°F | Resp 18 | Ht 63.0 in | Wt 346.0 lb

## 2016-12-24 DIAGNOSIS — Z794 Long term (current) use of insulin: Secondary | ICD-10-CM

## 2016-12-24 DIAGNOSIS — I10 Essential (primary) hypertension: Secondary | ICD-10-CM

## 2016-12-24 DIAGNOSIS — F319 Bipolar disorder, unspecified: Secondary | ICD-10-CM | POA: Diagnosis not present

## 2016-12-24 DIAGNOSIS — M545 Low back pain, unspecified: Secondary | ICD-10-CM

## 2016-12-24 DIAGNOSIS — Z6841 Body Mass Index (BMI) 40.0 and over, adult: Secondary | ICD-10-CM | POA: Diagnosis not present

## 2016-12-24 DIAGNOSIS — E1140 Type 2 diabetes mellitus with diabetic neuropathy, unspecified: Secondary | ICD-10-CM | POA: Diagnosis not present

## 2016-12-24 DIAGNOSIS — E1169 Type 2 diabetes mellitus with other specified complication: Secondary | ICD-10-CM | POA: Diagnosis not present

## 2016-12-24 DIAGNOSIS — E785 Hyperlipidemia, unspecified: Secondary | ICD-10-CM

## 2016-12-24 DIAGNOSIS — M1712 Unilateral primary osteoarthritis, left knee: Secondary | ICD-10-CM

## 2016-12-24 LAB — POCT GLYCOSYLATED HEMOGLOBIN (HGB A1C): HEMOGLOBIN A1C: 7.2

## 2016-12-24 LAB — GLUCOSE, POCT (MANUAL RESULT ENTRY): POC Glucose: 171 mg/dl — AB (ref 70–99)

## 2016-12-24 MED ORDER — CLONAZEPAM 1 MG PO TABS: ORAL_TABLET | ORAL | 2 refills | Status: DC

## 2016-12-24 MED ORDER — CLONAZEPAM 2 MG PO TABS: ORAL_TABLET | ORAL | 3 refills | Status: DC

## 2016-12-24 MED ORDER — MELOXICAM 15 MG PO TABS: 15.0000 mg | ORAL_TABLET | Freq: Every day | ORAL | 0 refills | Status: DC

## 2016-12-24 NOTE — Progress Notes (Signed)
Subjective:    Patient ID: Alicia Lucero, female    DOB: [birth year], 56 y.o.   MRN: [id number]  12/24/2016  Medication Refill (Klonopin, Flonase, Insulin Glargine, Liraglutide, Zoloft) and a1C check   HPI This 56 y.o. female presents for follow-up for DMII, hypercholesterolemia, bipolar disorder.  Patient reports good compliance with medication, good tolerance to medication, and good symptom control.  Last HgbA1c 7.8.  Followed by psychiatry for bipolar; refuses to prescribe benzo; pt continues to receive benzo from PCP yet in process of weaning benzo; pt inquiring today how she will manage her anxiety when we stop benzo.  Did not maintain low carb diet after last visit.    Allergies have been bad lately; +sneezing, +rhinorrhea; +nasal congestion.   Lower back pain and knee pain continue and wax and wane in severity.  S/p 10/31/16 lumbar spine films for acute lower back pain.    Immunization History  Administered Date(s) Administered  . Influenza,inj,Quad PF,36+ Mos 06/09/2013, 07/20/2014, 07/21/2015  . Influenza-Unspecified 06/08/2016  . Pneumococcal Polysaccharide-23 07/21/2015  . Pneumococcal-Unspecified 01/06/2009  . Tdap 01/13/2009   BP Readings from Last 3 Encounters:  01/10/17 99/78  12/24/16 107/70  10/31/16 118/70   Wt Readings from Last 3 Encounters:  12/24/16 (!) 346 lb (156.9 kg)  10/31/16 (!) 344 lb 6.4 oz (156.2 kg)  08/27/16 (!) 342 lb (155.1 kg)    Review of Systems  Constitutional: Negative for chills, diaphoresis, fatigue and fever.  HENT: Positive for congestion, postnasal drip, rhinorrhea and sneezing. Negative for ear pain and sore throat.   Eyes: Negative for visual disturbance.  Respiratory: Negative for cough and shortness of breath.   Cardiovascular: Negative for chest pain, palpitations and leg swelling.  Gastrointestinal: Negative for abdominal pain, constipation, diarrhea, nausea and vomiting.  Endocrine: Negative for cold intolerance, heat  intolerance, polydipsia, polyphagia and polyuria.  Musculoskeletal: Positive for arthralgias, back pain and gait problem.  Neurological: Negative for dizziness, tremors, seizures, syncope, facial asymmetry, speech difficulty, weakness, light-headedness, numbness and headaches.  Psychiatric/Behavioral: Negative for dysphoric mood, self-injury, sleep disturbance and suicidal ideas. The patient is nervous/anxious.     Past Medical History:  Diagnosis Date  . Allergy   . Asthma   . Bipolar 1 disorder (HCC)   . Diabetes mellitus without complication (HCC)   . GERD (gastroesophageal reflux disease)   . Hyperlipidemia    Past Surgical History:  Procedure Laterality Date  . CHOLECYSTECTOMY  1996  . DILATION AND CURETTAGE OF UTERUS     secondary to menorrhagia  . KNEE ARTHROSCOPY Left 2008  . TONSILLECTOMY     as a child  . WISDOM TOOTH EXTRACTION     Allergies  Allergen Reactions  . Empagliflozin Other (See Comments)    Causes yeast infection  . Empagliflozin-Linagliptin Other (See Comments)    Causes yeast infection  . Other Nausea Only    States she can't tolerate seafood smell  . Oxycodone     Sleepy, nausea, diarrhea, vomiting    Social History   Social History  . Marital status: Married    Spouse name: [name]  . Number of children: 0  . Years of education: Assoc.   Occupational History  . housekeeping    Social History Main Topics  . Smoking status: Former Smoker    Types: Cigarettes    Quit date: 06/30/2005  . Smokeless tobacco: Never Used     Comment: electronic cigarettes PRN  . Alcohol use No     Comment: quit: 2007  .  Drug use: No     Comment: occasional  . Sexual activity: Yes    Birth control/ protection: None     Comment: PCOS/chronic infertility   Other Topics Concern  . Not on file   Social History Narrative   Patient lives at home with her spouse.   Caffeine Use: 5-7 caffeine drinks daily   Family History  Problem Relation Age of Onset  .  Cancer Mother 43       lung  . Mental illness Mother        bipolar mania  . Hypertension Mother   . Cancer Father        liver  . Hypertension Father   . Heart disease Father 29       CAD/CABG  . Multiple sclerosis Sister   . Hypertension Sister   . Diabetes Sister        Objective:    BP 107/70   Pulse 82   Temp 98.8 F (37.1 C) (Oral)   Resp 18   Ht  (1.6 m)   Wt (!) 346 lb (156.9 kg)   LMP 12/22/2016   SpO2 94%   BMI 61.29 kg/m  Physical Exam  Constitutional: She is oriented to person, place, and time. She appears well-developed and well-nourished. No distress.  HENT:  Head: Normocephalic and atraumatic.  Right Ear: External ear normal.  Left Ear: External ear normal.  Nose: Nose normal.  Mouth/Throat: Oropharynx is clear and moist.  Eyes: Conjunctivae and EOM are normal. Pupils are equal, round, and reactive to light.  Neck: Normal range of motion. Neck supple. Carotid bruit is not present. No thyromegaly present.  Cardiovascular: Normal rate, regular rhythm, normal heart sounds and intact distal pulses.  Exam reveals no gallop and no friction rub.   No murmur heard. Pulmonary/Chest: Effort normal and breath sounds normal. She has no wheezes. She has no rales.  Abdominal: Soft. Bowel sounds are normal. She exhibits no distension and no mass. There is no tenderness. There is no rebound and no guarding.  Musculoskeletal:       Left knee: She exhibits decreased range of motion. She exhibits no swelling, no effusion and no erythema. Tenderness found. Medial joint line tenderness noted.       Lumbar back: She exhibits decreased range of motion, pain and spasm. She exhibits no tenderness and no bony tenderness.  Lymphadenopathy:    She has no cervical adenopathy.  Neurological: She is alert and oriented to person, place, and time. No cranial nerve deficit.  Skin: Skin is warm and dry. No rash noted. She is not diaphoretic. No erythema. No pallor.  Psychiatric:  She has a normal mood and affect. Her behavior is normal.   Results for orders placed or performed in visit on 12/24/16  Comprehensive metabolic panel  Result Value Ref Range   Glucose 161 (H) 65 - 99 mg/dL   BUN 12 6 - 24 mg/dL   Creatinine, Ser 1.61 0.57 - 1.00 mg/dL   GFR calc non Af Amer 83 >59 mL/min/1.73   GFR calc Af Amer 96 >59 mL/min/1.73   BUN/Creatinine Ratio 15 9 - 23   Sodium 139 134 - 144 mmol/L   Potassium 4.5 3.5 - 5.2 mmol/L   Chloride 100 96 - 106 mmol/L   CO2 23 18 - 29 mmol/L   Calcium 9.2 8.7 - 10.2 mg/dL   Total Protein 6.5 6.0 - 8.5 g/dL   Albumin 4.0 3.5 - 5.5 g/dL  Globulin, Total 2.5 1.5 - 4.5 g/dL   Albumin/Globulin Ratio 1.6 1.2 - 2.2   Bilirubin Total 0.2 0.0 - 1.2 mg/dL   Alkaline Phosphatase 97 39 - 117 IU/L   AST 17 0 - 40 IU/L   ALT 15 0 - 32 IU/L  POCT glycosylated hemoglobin (Hb A1C)  Result Value Ref Range   Hemoglobin A1C 7.2   POCT glucose (manual entry)  Result Value Ref Range   POC Glucose 171 (A) 70 - 99 mg/dl   Depression screen [facility] 2/9 12/24/2016 10/31/2016 08/27/2016 06/25/2016 03/04/2016  Decreased Interest 0 0 0 0 2  Down, Depressed, Hopeless 0 0 0 0 3  PHQ - 2 Score 0 0 0 0 5  Altered sleeping - - - - 3  Tired, decreased energy - - - - 3  Change in appetite - - - - 3  Feeling bad or failure about yourself  - - - - 3  Trouble concentrating - - - - 2  Moving slowly or fidgety/restless - - - - 2  Suicidal thoughts - - - - 1  PHQ-9 Score - - - - 22  Difficult doing work/chores - - - - -   Fall Risk  12/24/2016 10/31/2016 08/27/2016 06/25/2016 03/04/2016  Falls in the past year? No No No No -  Number falls in past yr: - - - - 1  Injury with Fall? - - - - Yes       Assessment & Plan:   1. Type 2 diabetes mellitus with diabetic neuropathy, with long-term current use of insulin (HCC)   2. Dyslipidemia associated with type 2 diabetes mellitus (HCC)   3. Benign essential HTN   4. Bipolar 1 disorder (HCC)   5. Morbid obesity  with BMI of 50.0-59.9, adult (HCC)   6. Primary osteoarthritis of left knee   7. Acute midline low back pain without sciatica    -improved glycemic control with improved HgbA1c from last visit.  Recommend exercise, weight loss, and low-carbohydrate food choices. -rx for Meloxicam provided for L knee OA and DDD lumbar spine.   -decrease Klonopin to  tid #90 2 refills; plan to decrease Klonopin to  tid for the next three months.  Then will decrease further in three months; recommend pt discuss anxiety management with psychiatry.    Orders Placed This Encounter  Procedures  . Comprehensive metabolic panel    Order Specific Question:   Has the patient fasted?    Answer:   Yes  . POCT glycosylated hemoglobin (Hb A1C)  . POCT glucose (manual entry)  . HM Diabetes Foot Exam   Meds ordered this encounter  Medications  . meloxicam (MOBIC) 15 MG tablet    Sig: Take 1 tablet (15 mg total) by mouth daily.    Dispense:  30 tablet    Refill:  0  . DISCONTD: clonazePAM (KLONOPIN) 2 MG tablet    Sig: TAKE 1 TABLET AT BEDTIME    Dispense:  30 tablet    Refill:  3  . clonazePAM (KLONOPIN) 1 MG tablet    Sig: TAKE 1 tablet three times daily    Dispense:  90 tablet    Refill:  2  . fluticasone (FLONASE) 50 MCG/ACT nasal spray    Sig: Place 2 sprays into both nostrils 2 (two) times daily.    Dispense:  16 g    Refill:  11  . Insulin Glargine (TOUJEO SOLOSTAR) 300 UNIT/ML SOPN    Sig: Inject 50  Units into the skin daily.    Dispense:  9 pen    Refill:  3  . liraglutide (VICTOZA) 18 MG/3ML SOPN    Sig: INJECT 0.3 MILLILITERS (1.8 MG) INTO THE SKIN DAILY    Dispense:  9 pen    Refill:  3    Return in about 3 months (around 03/25/2017) for recheck diabetes, high cholesterol.   Kristi Paulita Fujita, M.D. Primary Care at [facility] previously [facility] [address] [place], [place]  [address] [phone] phone [phone] fax

## 2016-12-24 NOTE — Patient Instructions (Addendum)
   IF you received an x-ray today, you will receive an invoice from [facility]. Please contact [facility] at [phone] with questions or concerns regarding your invoice.   IF you received labwork today, you will receive an invoice from [facility]. Please contact [facility] at [phone] with questions or concerns regarding your invoice.   Our billing staff will not be able to assist you with questions regarding bills from these companies.  You will be contacted with the lab results as soon as they are available. The fastest way to get your results is to activate your My Chart account. Instructions are located on the last page of this paperwork. If you have not heard from us regarding the results in 2 weeks, please contact this office.     Low Back Sprain Rehab Ask your health care provider which exercises are safe for you. Do exercises exactly as told by your health care provider and adjust them as directed. It is normal to feel mild stretching, pulling, tightness, or discomfort as you do these exercises, but you should stop right away if you feel sudden pain or your pain gets worse. Do not begin these exercises until told by your health care provider. Stretching and range of motion exercises These exercises warm up your muscles and joints and improve the movement and flexibility of your back. These exercises also help to relieve pain, numbness, and tingling. Exercise A: Lumbar rotation   1. Lie on your back on a firm surface and bend your knees. 2. Straighten your arms out to your sides so each arm forms an "L" shape with a side of your body (a 90 degree angle). 3. Slowly move both of your knees to one side of your body until you feel a stretch in your lower back. Try not to let your shoulders move off of the floor. 4. Hold for __________ seconds. 5. Tense your abdominal muscles and slowly move your knees back to the starting position. 6. Repeat this exercise on the  other side of your body. Repeat __________ times. Complete this exercise __________ times a day. Exercise B: Prone extension on elbows   1. Lie on your abdomen on a firm surface. 2. Prop yourself up on your elbows. 3. Use your arms to help lift your chest up until you feel a gentle stretch in your abdomen and your lower back.  This will place some of your body weight on your elbows. If this is uncomfortable, try stacking pillows under your chest.  Your hips should stay down, against the surface that you are lying on. Keep your hip and back muscles relaxed. 4. Hold for __________ seconds. 5. Slowly relax your upper body and return to the starting position. Repeat __________ times. Complete this exercise __________ times a day. Strengthening exercises These exercises build strength and endurance in your back. Endurance is the ability to use your muscles for a long time, even after they get tired. Exercise C: Pelvic tilt  1. Lie on your back on a firm surface. Bend your knees and keep your feet flat. 2. Tense your abdominal muscles. Tip your pelvis up toward the ceiling and flatten your lower back into the floor.  To help with this exercise, you may place a small towel under your lower back and try to push your back into the towel. 3. Hold for __________ seconds. 4. Let your muscles relax completely before you repeat this exercise. Repeat __________ times. Complete this exercise __________ times a day. Exercise   D: Alternating arm and leg raises   1. Get on your hands and knees on a firm surface. If you are on a hard floor, you may want to use padding to cushion your knees, such as an exercise mat. 2. Line up your arms and legs. Your hands should be below your shoulders, and your knees should be below your hips. 3. Lift your left leg behind you. At the same time, raise your right arm and straighten it in front of you.  Do not lift your leg higher than your hip.  Do not lift your arm  higher than your shoulder.  Keep your abdominal and back muscles tight.  Keep your hips facing the ground.  Do not arch your back.  Keep your balance carefully, and do not hold your breath. 4. Hold for __________ seconds. 5. Slowly return to the starting position and repeat with your right leg and your left arm. Repeat __________ times. Complete this exercise __________ times a day. Exercise E: Abdominal set with straight leg raise   1. Lie on your back on a firm surface. 2. Bend one of your knees and keep your other leg straight. 3. Tense your abdominal muscles and lift your straight leg up, 4-6 inches (10-15 cm) off the ground. 4. Keep your abdominal muscles tight and hold for __________ seconds.  Do not hold your breath.  Do not arch your back. Keep it flat against the ground. 5. Keep your abdominal muscles tense as you slowly lower your leg back to the starting position. 6. Repeat with your other leg. Repeat __________ times. Complete this exercise __________ times a day. Posture and body mechanics   Body mechanics refers to the movements and positions of your body while you do your daily activities. Posture is part of body mechanics. Good posture and healthy body mechanics can help to relieve stress in your body's tissues and joints. Good posture means that your spine is in its natural S-curve position (your spine is neutral), your shoulders are pulled back slightly, and your head is not tipped forward. The following are general guidelines for applying improved posture and body mechanics to your everyday activities. Standing    When standing, keep your spine neutral and your feet about hip-width apart. Keep a slight bend in your knees. Your ears, shoulders, and hips should line up.  When you do a task in which you stand in one place for a long time, place one foot up on a stable object that is 2-4 inches (5-10 cm) high, such as a footstool. This helps keep your spine  neutral. Sitting    When sitting, keep your spine neutral and keep your feet flat on the floor. Use a footrest, if necessary, and keep your thighs parallel to the floor. Avoid rounding your shoulders, and avoid tilting your head forward.  When working at a desk or a computer, keep your desk at a height where your hands are slightly lower than your elbows. Slide your chair under your desk so you are close enough to maintain good posture.  When working at a computer, place your monitor at a height where you are looking straight ahead and you do not have to tilt your head forward or downward to look at the screen. Resting    When lying down and resting, avoid positions that are most painful for you.  If you have pain with activities such as sitting, bending, stooping, or squatting (flexion-based activities), lie in a position in which   your body does not bend very much. For example, avoid curling up on your side with your arms and knees near your chest (fetal position).  If you have pain with activities such as standing for a long time or reaching with your arms (extension-based activities), lie with your spine in a neutral position and bend your knees slightly. Try the following positions:  Lying on your side with a pillow between your knees.  Lying on your back with a pillow under your knees. Lifting    When lifting objects, keep your feet at least shoulder-width apart and tighten your abdominal muscles.  Bend your knees and hips and keep your spine neutral. It is important to lift using the strength of your legs, not your back. Do not lock your knees straight out.  Always ask for help to lift heavy or awkward objects. This information is not intended to replace advice given to you by your health care provider. Make sure you discuss any questions you have with your health care provider. Document Released: 08/25/2005 Document Revised: 05/01/2016 Document Reviewed: 06/06/2015 Elsevier  Interactive Patient Education  2017 [employer]  

## 2016-12-25 LAB — COMPREHENSIVE METABOLIC PANEL
ALBUMIN: 4 g/dL (ref 3.5–5.5)
ALT: 15 IU/L (ref 0–32)
AST: 17 IU/L (ref 0–40)
Albumin/Globulin Ratio: 1.6 (ref 1.2–2.2)
Alkaline Phosphatase: 97 IU/L (ref 39–117)
BUN / CREAT RATIO: 15 (ref 9–23)
BUN: 12 mg/dL (ref 6–24)
Bilirubin Total: 0.2 mg/dL (ref 0.0–1.2)
CALCIUM: 9.2 mg/dL (ref 8.7–10.2)
CO2: 23 mmol/L (ref 18–29)
CREATININE: 0.81 mg/dL (ref 0.57–1.00)
Chloride: 100 mmol/L (ref 96–106)
GFR calc Af Amer: 96 mL/min/{1.73_m2} (ref >59)
GFR, EST NON AFRICAN AMERICAN: 83 mL/min/{1.73_m2} (ref >59)
GLOBULIN, TOTAL: 2.5 g/dL (ref 1.5–4.5)
GLUCOSE: 161 mg/dL — AB (ref 65–99)
Potassium: 4.5 mmol/L (ref 3.5–5.2)
SODIUM: 139 mmol/L (ref 134–144)
Total Protein: 6.5 g/dL (ref 6.0–8.5)

## 2016-12-27 ENCOUNTER — Other Ambulatory Visit: Payer: Self-pay | Admitting: Family Medicine

## 2016-12-27 DIAGNOSIS — R062 Wheezing: Secondary | ICD-10-CM

## 2016-12-29 NOTE — Telephone Encounter (Signed)
Dr. Katrinka Blazing,  Alicia Lucero states she is having some allergy symptoms where she needs her inhaler, she does have asthma per patient.    This inhaler has not been filled recently .  Please advise

## 2016-12-29 NOTE — Telephone Encounter (Signed)
Unable reach mailbox full.  What symptoms is she having ?

## 2017-01-06 ENCOUNTER — Institutional Professional Consult (permissible substitution): Payer: BLUE CROSS/BLUE SHIELD | Admitting: Neurology

## 2017-01-06 ENCOUNTER — Telehealth: Payer: Self-pay

## 2017-01-06 NOTE — Telephone Encounter (Signed)
Pt did not show for their appt with Dr. Dohmeier today.  

## 2017-01-07 ENCOUNTER — Encounter: Payer: Self-pay | Admitting: Neurology

## 2017-01-10 ENCOUNTER — Encounter (HOSPITAL_COMMUNITY): Payer: Self-pay | Admitting: *Deleted

## 2017-01-10 ENCOUNTER — Emergency Department (HOSPITAL_COMMUNITY)
Admission: EM | Admit: 2017-01-10 | Discharge: 2017-01-10 | Disposition: A | Discharge status: Home / Self Care | Payer: BLUE CROSS/BLUE SHIELD | Attending: Emergency Medicine | Admitting: Emergency Medicine

## 2017-01-10 DIAGNOSIS — I10 Essential (primary) hypertension: Secondary | ICD-10-CM | POA: Diagnosis not present

## 2017-01-10 DIAGNOSIS — Z794 Long term (current) use of insulin: Secondary | ICD-10-CM | POA: Insufficient documentation

## 2017-01-10 DIAGNOSIS — Z79899 Other long term (current) drug therapy: Secondary | ICD-10-CM | POA: Insufficient documentation

## 2017-01-10 DIAGNOSIS — Z87891 Personal history of nicotine dependence: Secondary | ICD-10-CM | POA: Diagnosis not present

## 2017-01-10 DIAGNOSIS — Y999 Unspecified external cause status: Secondary | ICD-10-CM | POA: Diagnosis not present

## 2017-01-10 DIAGNOSIS — J45909 Unspecified asthma, uncomplicated: Secondary | ICD-10-CM | POA: Insufficient documentation

## 2017-01-10 DIAGNOSIS — Y939 Activity, unspecified: Secondary | ICD-10-CM | POA: Diagnosis not present

## 2017-01-10 DIAGNOSIS — T161XXA Foreign body in right ear, initial encounter: Secondary | ICD-10-CM | POA: Diagnosis not present

## 2017-01-10 DIAGNOSIS — Y929 Unspecified place or not applicable: Secondary | ICD-10-CM | POA: Diagnosis not present

## 2017-01-10 DIAGNOSIS — E1140 Type 2 diabetes mellitus with diabetic neuropathy, unspecified: Secondary | ICD-10-CM | POA: Diagnosis not present

## 2017-01-10 DIAGNOSIS — X58XXXA Exposure to other specified factors, initial encounter: Secondary | ICD-10-CM | POA: Insufficient documentation

## 2017-01-10 NOTE — ED Provider Notes (Signed)
[facility]-EMERGENCY DEPT Provider Note   CSN: [id number] Arrival date & time: 01/10/17  0605     History   Chief Complaint Chief Complaint  Patient presents with  . Foreign Body in Ear    HPI Alicia Lucero is a 56 y.o. female.  HPI  Patient presents to the emergency department with a foreign body in her right ear.  The patient states she sleeps with headphones on, and she feels like the ear bud came off.  Patient states that she normally listens to music while she sleeps.  She states she woke up with pain in her right ear and she noticed that the soft part of the head phone to come off.  Patient has no other complaints.  She denies decreased hearing, dizziness or vomiting Past Medical History:  Diagnosis Date  . Allergy   . Asthma   . Bipolar 1 disorder (HCC)   . Diabetes mellitus without complication (HCC)   . GERD (gastroesophageal reflux disease)   . Hyperlipidemia     Patient Active Problem List   Diagnosis Date Noted  . Primary osteoarthritis of left knee 09/08/2016  . Type 2 diabetes mellitus with diabetic neuropathy, with long-term current use of insulin (HCC) 07/19/2015  . Dyslipidemia associated with type 2 diabetes mellitus (HCC) 07/19/2015  . Benign essential HTN 07/19/2015  . Bipolar 1 disorder (HCC) 03/01/2015  . Morbid obesity with BMI of 50.0-59.9, adult (HCC) 12/26/2014  . Asthma, chronic 09/21/2013    Past Surgical History:  Procedure Laterality Date  . CHOLECYSTECTOMY  1996  . DILATION AND CURETTAGE OF UTERUS     secondary to menorrhagia  . KNEE ARTHROSCOPY Left 2008  . TONSILLECTOMY     as a child  . WISDOM TOOTH EXTRACTION      OB History    Gravida Para Term Preterm AB Living   1       1 0   SAB TAB Ectopic Multiple Live Births     1             Home Medications    Prior to Admission medications   Medication Sig Start Date End Date Taking? Authorizing Provider  ACCU-CHEK AVIVA PLUS test strip USE AS DIRECTED 05/07/14   Elvina SidleLauenstein, Kurt, MD   ACCU-CHEK SOFTCLIX LANCETS lancets USE AS DIRECTED    Marte, Heather M, PA-C  B Complex-C (B-COMPLEX WITH VITAMIN C) tablet Take 1 tablet by mouth daily.    [provider]  B-D ULTRAFINE III SHORT PEN 31G X 8 MM MISC use as directed 09/05/16   Porfirio OarJeffery, Chelle, PA-C  Calcium-Magnesium-Vitamin D (CALCIUM 500 PO) Take by mouth daily.    [provider]  clonazePAM Scarlette Calico(KLONOPIN) 1 MG tablet TAKE 1 tablet three times daily 12/24/16   Ethelda ChickSmith, Kristi M, MD  fluticasone [facility](FLONASE) 50 MCG/ACT nasal spray Place 2 sprays into both nostrils 2 (two) times daily. 06/25/16   Ethelda ChickSmith, Kristi M, MD  gabapentin (NEURONTIN) 300 MG capsule TAKE 2 CAPSULES BY MOUTH THREE TIMES A DAY Patient taking differently: TAKE 4 CAPSULES BY MOUTH THREE TIMES A DAY 02/11/16   Ethelda ChickSmith, Kristi M, MD  GLUCOSA-CHONDR-NA CHONDR-MSM PO Take by mouth.    [provider]  Insulin Glargine (TOUJEO SOLOSTAR) 300 UNIT/ML SOPN Inject 50 Units into the skin daily. 06/25/16   Ethelda ChickSmith, Kristi M, MD  ipratropium-albuterol (DUONEB) 0.5-2.5 (3) MG/3ML SOLN Take 3 mLs by nebulization every 6 (six) hours as needed. 07/28/15   Elvina SidleLauenstein, Kurt, MD  ketoconazole (NIZORAL) 2 %  cream Apply 1 application topically 2 (two) times daily. Patient taking differently: Apply 1 application topically 2 (two) times daily as needed for irritation.  07/19/15   [name], MD  lamoTRIgine (LAMICTAL) 200 MG tablet Take 2 tablets (400 mg total) by mouth every evening. 02/11/16   Ethelda Chick, MD  liraglutide (VICTOZA) 18 MG/3ML SOPN INJECT 0.3 MILLILITERS (1.8 MG) INTO THE SKIN DAILY 06/25/16   Ethelda Chick, MD  lisinopril (PRINIVIL,ZESTRIL) 5 MG tablet Take 1 tablet (5 mg total) by mouth daily. 08/27/16   Ethelda Chick, MD  meloxicam (MOBIC) 15 MG tablet Take 1 tablet (15 mg total) by mouth daily. 12/24/16   Ethelda Chick, MD  metFORMIN (GLUCOPHAGE-XR) 500 MG 24 hr tablet Take 2 tablets (1,000 mg total) by mouth 2 (two) times daily. 02/11/16    Ethelda Chick, MD  Multiple Vitamins-Minerals (WOMENS MULTIVITAMIN PLUS PO) Take 1 tablet by mouth daily.     [provider]  mupirocin ointment (BACTROBAN) 2 % apply to affected area twice a day 12/10/16   Ethelda Chick, MD  [facility] powder apply topically four times a day 12/10/16   Ethelda Chick, MD  nystatin cream (MYCOSTATIN) Apply 1 application topically 2 (two) times daily. 08/27/16   Ethelda Chick, MD  Omega-3 Fatty Acids (FISH OIL PO) Take 2 capsules by mouth 2 (two) times daily.    [provider]  OVER THE COUNTER MEDICATION Tumeric supplement taking daily    [provider]  ranitidine (ZANTAC) 150 MG capsule Take 1 capsule (150 mg total) by mouth 2 (two) times daily. 02/11/16   Ethelda Chick, MD  sertraline (ZOLOFT) 100 MG tablet Take 2 tablets (200 mg total) by mouth daily. 02/11/16   Ethelda Chick, MD  simvastatin (ZOCOR) 20 MG tablet Take 1 tablet (20 mg total) by mouth at bedtime. 06/25/16   Ethelda Chick, MD  VENTOLIN HFA 108 [phone] Base) MCG/ACT inhaler inhale 2 puffs by mouth every 4 hours if needed for cough wheezing or shortness of breath 12/29/16   Ethelda Chick, MD    Family History Family History  Problem Relation Age of Onset  . Cancer Mother 61    lung  . Mental illness Mother     bipolar mania  . Hypertension Mother   . Cancer Father     liver  . Hypertension Father   . Heart disease Father 47    CAD/CABG  . Multiple sclerosis Sister   . Hypertension Sister   . Diabetes Sister     Social History Social History  Substance Use Topics  . Smoking status: Former Smoker    Types: Cigarettes    Quit date: 06/30/2005  . Smokeless tobacco: Never Used     Comment: electronic cigarettes PRN  . Alcohol use No     Comment: quit: 2007     Allergies   Empagliflozin; Empagliflozin-linagliptin; Other; and Oxycodone   Review of Systems Review of Systems All other systems negative except as documented in the HPI. All pertinent  positives and negatives as reviewed in the HPI.  Physical Exam Updated Vital Signs BP 99/78 (BP Location: Left Arm)   Pulse 84   Temp 97.8 F (36.6 C) (Oral)   Resp 20   LMP 12/22/2016   SpO2 98%   Physical Exam  Constitutional: She is oriented to person, place, and time. She appears well-developed and well-nourished. No distress.  HENT:  Head: Normocephalic and atraumatic.  Right Ear: Tympanic membrane normal. A foreign body is present.  Left Ear: Tympanic membrane normal.  Eyes: Pupils are equal, round, and reactive to light.  Pulmonary/Chest: Effort normal.  Neurological: She is alert and oriented to person, place, and time.  Skin: Skin is warm and dry.  Psychiatric: She has a normal mood and affect.  Nursing note and vitals reviewed.    ED Treatments / Results  Labs (all labs ordered are listed, but only abnormal results are displayed) Labs Reviewed - No data to display  EKG  EKG Interpretation None       Radiology No results found.  Procedures .Foreign Body Removal Date/Time: 01/10/2017 3:44 PM Performed by: Charlestine Night Authorized by: Charlestine Night  Consent: Verbal consent obtained. Risks and benefits: risks, benefits and alternatives were discussed Consent given by: patient Patient understanding: patient states understanding of the procedure being performed Patient consent: the patient's understanding of the procedure matches consent given Patient identity confirmed: verbally with patient Body area: ear Location details: right ear  Sedation: Patient sedated: no Removal mechanism: alligator forceps 1 objects recovered. Post-procedure assessment: foreign body removed Patient tolerance: Patient tolerated the procedure well with no immediate complications   (including critical care time)  Medications Ordered in ED Medications - No data to display   Initial Impression / Assessment and Plan / ED Course  I have reviewed the triage vital  signs and the nursing notes.  Pertinent labs & imaging results that were available during my care of the patient were reviewed by me and considered in my medical decision making (see chart for details).  Patient is advised follow-up with her primary care Dr. told to return here as needed   Final Clinical Impressions(s) / ED Diagnoses   Final diagnoses:  None    New Prescriptions New Prescriptions   No medications on file     Charlestine Night, Cordelia Poche 01/10/17 1545    Little, Ambrose Finland, MD 01/10/17 1600

## 2017-01-10 NOTE — Discharge Instructions (Signed)
REturn here as needed.

## 2017-01-10 NOTE — ED Triage Notes (Signed)
Patient is alert and oriented x4.  She is being seen to have a Earphone Ear bud removed from her right ear.  Currently she rates her pain 3 of 10

## 2017-01-19 ENCOUNTER — Telehealth: Payer: Self-pay | Admitting: Family Medicine

## 2017-01-19 DIAGNOSIS — K219 Gastro-esophageal reflux disease without esophagitis: Secondary | ICD-10-CM

## 2017-01-19 MED ORDER — RANITIDINE HCL 150 MG PO CAPS: 150.0000 mg | ORAL_CAPSULE | Freq: Two times a day (BID) | ORAL | 3 refills | Status: DC

## 2017-01-19 NOTE — Telephone Encounter (Signed)
Contacted pt concerning referral for her husband. Pt also wanted to check in on a refill she said her pharmacy was supposed to contact us about. Pt requesting a refill of Zantac 150 mg. Pt was former Lauenstein pt and had care transferred to Dr. Katrinka BlazingSmith. Please advise. Pt contact number is [phone]. Thanks!

## 2017-01-26 MED ORDER — LIRAGLUTIDE 18 MG/3ML ~~LOC~~ SOPN: PEN_INJECTOR | SUBCUTANEOUS | 3 refills | Status: DC

## 2017-01-26 MED ORDER — INSULIN GLARGINE 300 UNIT/ML ~~LOC~~ SOPN: 50.0000 [IU] | PEN_INJECTOR | Freq: Every day | SUBCUTANEOUS | 3 refills | Status: DC

## 2017-01-26 MED ORDER — FLUTICASONE PROPIONATE 50 MCG/ACT NA SUSP: 2.0000 | Freq: Two times a day (BID) | NASAL | 11 refills | Status: DC

## 2017-02-15 ENCOUNTER — Other Ambulatory Visit: Payer: Self-pay | Admitting: Family Medicine

## 2017-04-09 ENCOUNTER — Other Ambulatory Visit: Payer: Self-pay | Admitting: Family Medicine

## 2017-04-09 DIAGNOSIS — F319 Bipolar disorder, unspecified: Secondary | ICD-10-CM

## 2017-04-11 NOTE — Telephone Encounter (Signed)
Please call in Klonopin as approved. ([place] Controlled Substance review:  Last fill 03/08/17).

## 2017-04-13 ENCOUNTER — Other Ambulatory Visit: Payer: Self-pay | Admitting: Family Medicine

## 2017-04-13 DIAGNOSIS — F319 Bipolar disorder, unspecified: Secondary | ICD-10-CM

## 2017-04-13 NOTE — Telephone Encounter (Signed)
Please advise 

## 2017-04-17 ENCOUNTER — Ambulatory Visit (INDEPENDENT_AMBULATORY_CARE_PROVIDER_SITE_OTHER): Payer: BLUE CROSS/BLUE SHIELD

## 2017-04-17 ENCOUNTER — Encounter: Payer: Self-pay | Admitting: Family Medicine

## 2017-04-17 ENCOUNTER — Ambulatory Visit (INDEPENDENT_AMBULATORY_CARE_PROVIDER_SITE_OTHER): Payer: BLUE CROSS/BLUE SHIELD | Admitting: Family Medicine

## 2017-04-17 VITALS — BP 125/80 | HR 107 | Temp 98.3°F | Resp 17 | Ht 64.5 in | Wt 348.0 lb

## 2017-04-17 DIAGNOSIS — M5442 Lumbago with sciatica, left side: Secondary | ICD-10-CM | POA: Diagnosis not present

## 2017-04-17 DIAGNOSIS — M1712 Unilateral primary osteoarthritis, left knee: Secondary | ICD-10-CM

## 2017-04-17 DIAGNOSIS — I10 Essential (primary) hypertension: Secondary | ICD-10-CM | POA: Diagnosis not present

## 2017-04-17 DIAGNOSIS — E1140 Type 2 diabetes mellitus with diabetic neuropathy, unspecified: Secondary | ICD-10-CM | POA: Diagnosis not present

## 2017-04-17 DIAGNOSIS — M5136 Other intervertebral disc degeneration, lumbar region: Secondary | ICD-10-CM | POA: Diagnosis not present

## 2017-04-17 DIAGNOSIS — Z794 Long term (current) use of insulin: Secondary | ICD-10-CM

## 2017-04-17 DIAGNOSIS — G8929 Other chronic pain: Secondary | ICD-10-CM | POA: Diagnosis not present

## 2017-04-17 DIAGNOSIS — Z6841 Body Mass Index (BMI) 40.0 and over, adult: Secondary | ICD-10-CM

## 2017-04-17 DIAGNOSIS — M79645 Pain in left finger(s): Secondary | ICD-10-CM | POA: Diagnosis not present

## 2017-04-17 DIAGNOSIS — F319 Bipolar disorder, unspecified: Secondary | ICD-10-CM

## 2017-04-17 LAB — GLUCOSE, POCT (MANUAL RESULT ENTRY): POC GLUCOSE: 175 mg/dL — AB (ref 70–99)

## 2017-04-17 LAB — POCT GLYCOSYLATED HEMOGLOBIN (HGB A1C): HEMOGLOBIN A1C: 7.4

## 2017-04-17 MED ORDER — CLONAZEPAM 1 MG PO TABS: 1.0000 mg | ORAL_TABLET | Freq: Two times a day (BID) | ORAL | 2 refills | Status: DC | PRN

## 2017-04-17 MED ORDER — METHYLPREDNISOLONE ACETATE 80 MG/ML IJ SUSP
80.0000 mg | Freq: Once | INTRAMUSCULAR | Status: AC
  Administered 2017-04-17: 80 mg via INTRAMUSCULAR

## 2017-04-17 NOTE — Progress Notes (Signed)
Subjective:    Patient ID: Alicia Lucero, female    DOB: 1961-03-12, 56 y.o.   MRN: [id number]  04/17/2017  Diabetes   HPI This 56 y.o. female presents for evaluation of DMII, hypertension, bipolar II.  Perimenopausal at this time.  Menses are now irregular.  Patient reports good compliance with medication, good tolerance to medication, and good symptom control.  Checks sugars; have been running a little high.    Lower back pain: due for lower back pain.  L sided lower back pain. Seeing physical therapist once per week.  Started Meloxicam one week ago.    L knee pain: horrible pain.  Known OA knee; needs steroid injection.  Realizes that weight is contributing.   L thumb pain: thinks may have something in thumb; no specific trauma or known FB.     BP Readings from Last 3 Encounters:  04/17/17 125/80  01/10/17 99/78  12/24/16 107/70   Wt Readings from Last 3 Encounters:  04/17/17 (!) 348 lb (157.9 kg)  12/24/16 (!) 346 lb (156.9 kg)  10/31/16 (!) 344 lb 6.4 oz (156.2 kg)   Immunization History  Administered Date(s) Administered  . Influenza,inj,Quad PF,36+ Mos 06/09/2013, 07/20/2014, 07/21/2015  . Influenza-Unspecified 06/08/2016  . Pneumococcal Polysaccharide-23 07/21/2015  . Pneumococcal-Unspecified 01/06/2009  . Tdap 01/13/2009    Review of Systems  Constitutional: Negative for chills, diaphoresis, fatigue and fever.  Eyes: Negative for visual disturbance.  Respiratory: Negative for cough and shortness of breath.   Cardiovascular: Negative for chest pain, palpitations and leg swelling.  Gastrointestinal: Negative for abdominal pain, constipation, diarrhea, nausea and vomiting.  Endocrine: Negative for cold intolerance, heat intolerance, polydipsia, polyphagia and polyuria.  Genitourinary: Positive for menstrual problem.  Musculoskeletal: Positive for arthralgias, back pain, gait problem and joint swelling.  Neurological: Negative for dizziness, tremors, seizures,  syncope, facial asymmetry, speech difficulty, weakness, light-headedness, numbness and headaches.  Psychiatric/Behavioral: Positive for dysphoric mood. Negative for self-injury, sleep disturbance and suicidal ideas. The patient is nervous/anxious.     Past Medical History:  Diagnosis Date  . Allergy   . Asthma   . Bipolar 1 disorder (HCC)   . Diabetes mellitus without complication (HCC)   . GERD (gastroesophageal reflux disease)   . Hyperlipidemia    Past Surgical History:  Procedure Laterality Date  . CHOLECYSTECTOMY  1996  . DILATION AND CURETTAGE OF UTERUS     secondary to menorrhagia  . KNEE ARTHROSCOPY Left 2008  . TONSILLECTOMY     as a child  . WISDOM TOOTH EXTRACTION     Allergies  Allergen Reactions  . Empagliflozin Other (See Comments)    Causes yeast infection  . Empagliflozin-Linagliptin Other (See Comments)    Causes yeast infection  . Other Nausea Only    States she can't tolerate seafood smell  . Oxycodone     Sleepy, nausea, diarrhea, vomiting    Social History   Social History  . Marital status: Married    Spouse name: [name]  . Number of children: 0  . Years of education: Assoc.   Occupational History  . housekeeping    Social History Main Topics  . Smoking status: Former Smoker    Types: Cigarettes    Quit date: 06/30/2005  . Smokeless tobacco: Never Used     Comment: electronic cigarettes PRN  . Alcohol use No     Comment: quit: 2007  . Drug use: No     Comment: occasional  . Sexual activity: Yes  Birth control/ protection: None     Comment: PCOS/chronic infertility   Other Topics Concern  . Not on file   Social History Narrative   Patient lives at home with her spouse.   Caffeine Use: 5-7 caffeine drinks daily   Family History  Problem Relation Age of Onset  . Cancer Mother 70       lung  . Mental illness Mother        bipolar mania  . Hypertension Mother   . Cancer Father        liver  . Hypertension Father   . Heart  disease Father 12       CAD/CABG  . Multiple sclerosis Sister   . Hypertension Sister   . Diabetes Sister        Objective:    BP 125/80   Pulse (!) 107   Temp 98.3 F (36.8 C) (Oral)   Resp 17   Ht 5' 4.5" (1.638 m)   Wt (!) 348 lb (157.9 kg)   SpO2 98%   BMI 58.81 kg/m  Physical Exam  Constitutional: She is oriented to person, place, and time. She appears well-developed and well-nourished. No distress.  HENT:  Head: Normocephalic and atraumatic.  Right Ear: External ear normal.  Left Ear: External ear normal.  Nose: Nose normal.  Mouth/Throat: Oropharynx is clear and moist.  Eyes: Pupils are equal, round, and reactive to light. Conjunctivae and EOM are normal.  Neck: Normal range of motion. Neck supple. Carotid bruit is not present. No thyromegaly present.  Cardiovascular: Normal rate, regular rhythm, normal heart sounds and intact distal pulses.  Exam reveals no gallop and no friction rub.   No murmur heard. Pulmonary/Chest: Effort normal and breath sounds normal. She has no wheezes. She has no rales.  Abdominal: Soft. Bowel sounds are normal. She exhibits no distension and no mass. There is no tenderness. There is no rebound and no guarding.  Musculoskeletal:       Lumbar back: She exhibits decreased range of motion, tenderness, pain and spasm. She exhibits no bony tenderness and normal pulse.  Lumbar spine:  Non-tender midline; +tender paraspinal regions B.  Straight leg raises negative B; toe and heel walking intact; marching intact; motor 5/5 BLE.  Decreased ROM lumbar spine without limitation.  LEFT THUMB: +TTP distal thumb at fat pad.  Full extension and flexion of thumb.  No IP swelling.  LEFT KNEE: no swelling; full extension and flexion; anterior drawer negative; V/V strain intact.   Lymphadenopathy:    She has no cervical adenopathy.  Neurological: She is alert and oriented to person, place, and time. No cranial nerve deficit.  Skin: Skin is warm and dry. No  rash noted. She is not diaphoretic. No erythema. No pallor.  Psychiatric: She has a normal mood and affect. Her behavior is normal.   Results for orders placed or performed in visit on 04/17/17  POCT glucose (manual entry)  Result Value Ref Range   POC Glucose 175 (A) 70 - 99 mg/dl  POCT glycosylated hemoglobin (Hb A1C)  Result Value Ref Range   Hemoglobin A1C 7.4    Dg Finger Thumb Left  Result Date: 04/17/2017 CLINICAL DATA:  Left thumb pain.  Possible foreign body. EXAM: LEFT THUMB 2+V COMPARISON:  None. FINDINGS: Negative for a fracture or dislocation. No evidence for a radiopaque foreign body. Alignment of the thumb is normal. Soft tissues are unremarkable. Mild spurring and degenerative changes at the thumb IP joint. IMPRESSION: No acute  abnormality.  No evidence for a radiopaque foreign body. Electronically Signed   By: Richarda Overlie M.D.   On: 04/17/2017 09:52   Depression screen [facility] 2/9 04/17/2017 12/24/2016 10/31/2016 08/27/2016 06/25/2016  Decreased Interest 0 0 0 0 0  Down, Depressed, Hopeless 0 0 0 0 0  PHQ - 2 Score 0 0 0 0 0  Altered sleeping - - - - -  Tired, decreased energy - - - - -  Change in appetite - - - - -  Feeling bad or failure about yourself  - - - - -  Trouble concentrating - - - - -  Moving slowly or fidgety/restless - - - - -  Suicidal thoughts - - - - -  PHQ-9 Score - - - - -  Difficult doing work/chores - - - - -   Fall Risk  04/17/2017 12/24/2016 10/31/2016 08/27/2016 06/25/2016  Falls in the past year? No No No No No  Comment - - - - -  Number falls in past yr: - - - - -  Injury with Fall? - - - - -  Comment - - - - -        Assessment & Plan:   1. Type 2 diabetes mellitus with diabetic neuropathy, with long-term current use of insulin (HCC)   2. Benign essential HTN   3. Morbid obesity with BMI of 50.0-59.9, adult (HCC)   4. Bipolar 1 disorder (HCC)   5. Primary osteoarthritis of left knee   6. Chronic left-sided low back pain with left-sided  sciatica   7. Degenerative disc disease, lumbar   8. Pain of left thumb    -DMII moderately controlled;  I recommend weight loss, exercise, and low-carbohydrate low-sugar food choices. You should AVOID: regular sodas, sweetened tea, fruit juices.  You should LIMIT: breads, pastas, rice, potatoes, and desserts/sweets.  I would recommend limiting your total carbohydrate intake per meal to 45 grams; I would limit your total carbohydrate intake per snack to 30 grams.  I would also have a goal of 60 grams of protein intake per day; this would equal 10-15 grams of protein per meal and 5-10 grams of protein per snack. -s/p Solumedrol injection for lower back pain and knee pain; recommend follow-up with ortho regarding chronic lower back pain and LEFT knee OA.   -decrease Klonopin to bid as discussed; patient managed by psychiatry who refuses to prescribe benzo.  Will continue to wean every three months until no longer taking.  -new onset LEFT thumb pain; no FB on xray; no obvious foreign body; pt mentioned at end of visit; recommend soaking thumb for 20 minutes bid for one week; RTC for redness, fever, swelling, increasing pain. -recommend weight loss, exercise for 30-60 minutes five days per week; recommend 1200 kcal restriction per day with a minimum of 60 grams of protein per day.  Recommend considering bariatric surgery.     Orders Placed This Encounter  Procedures  . DG Finger Thumb Left    Standing Status:   Future    Number of Occurrences:   1    Standing Expiration Date:   04/17/2018    Order Specific Question:   Reason for Exam (SYMPTOM  OR DIAGNOSIS REQUIRED)    Answer:   L IP pain; possible FB    Order Specific Question:   Is the patient pregnant?    Answer:   No    Order Specific Question:   Preferred imaging location?    Answer:  External  . CBC with Differential/Platelet  . Comprehensive metabolic panel    Order Specific Question:   Has the patient fasted?    Answer:   Yes  .  Lipid panel    Order Specific Question:   Has the patient fasted?    Answer:   Yes  . Microalbumin, urine  . POCT glucose (manual entry)  . POCT glycosylated hemoglobin (Hb A1C)   Meds ordered this encounter  Medications  . methylPREDNISolone acetate (DEPO-MEDROL) injection 80 mg  . clonazePAM (KLONOPIN) 1 MG tablet    Sig: Take 1 tablet (1 mg total) by mouth 2 (two) times daily as needed.    Dispense:  60 tablet    Refill:  2    No Follow-up on file.   Ymani Porcher Paulita Fujita, M.D. Primary Care at [facility] [address] [place], [place]  [address] [phone] phone [phone] fax

## 2017-04-17 NOTE — Patient Instructions (Addendum)
   IF you received an x-ray today, you will receive an invoice from [facility]. Please contact  Radiology at [phone] with questions or concerns regarding your invoice.   IF you received labwork today, you will receive an invoice from [facility]. Please contact [facility] at [phone] with questions or concerns regarding your invoice.   Our billing staff will not be able to assist you with questions regarding bills from these companies.  You will be contacted with the lab results as soon as they are available. The fastest way to get your results is to activate your My Chart account. Instructions are located on the last page of this paperwork. If you have not heard from us regarding the results in 2 weeks, please contact this office.     Calorie Counting for Weight Loss Calories are units of energy. Your body needs a certain amount of calories from food to keep you going throughout the day. When you eat more calories than your body needs, your body stores the extra calories as fat. When you eat fewer calories than your body needs, your body burns fat to get the energy it needs. Calorie counting means keeping track of how many calories you eat and drink each day. Calorie counting can be helpful if you need to lose weight. If you make sure to eat fewer calories than your body needs, you should lose weight. Ask your health care provider what a healthy weight is for you. For calorie counting to work, you will need to eat the right number of calories in a day in order to lose a healthy amount of weight per week. A dietitian can help you determine how many calories you need in a day and will give you suggestions on how to reach your calorie goal.  A healthy amount of weight to lose per week is usually 1-2 lb (0.5-0.9 kg). This usually means that your daily calorie intake should be reduced by 500-750 calories.  Eating 1,200 - 1,500 calories per day can help most women lose  weight.  Eating 1,500 - 1,800 calories per day can help most men lose weight. What is my plan? My goal is to have __________ calories per day. If I have this many calories per day, I should lose around __________ pounds per week. What do I need to know about calorie counting? In order to meet your daily calorie goal, you will need to:  Find out how many calories are in each food you would like to eat. Try to do this before you eat.  Decide how much of the food you plan to eat.  Write down what you ate and how many calories it had. Doing this is called keeping a food log. To successfully lose weight, it is important to balance calorie counting with a healthy lifestyle that includes regular activity. Aim for 150 minutes of moderate exercise (such as walking) or 75 minutes of vigorous exercise (such as running) each week. Where do I find calorie information?   The number of calories in a food can be found on a Nutrition Facts label. If a food does not have a Nutrition Facts label, try to look up the calories online or ask your dietitian for help. Remember that calories are listed per serving. If you choose to have more than one serving of a food, you will have to multiply the calories per serving by the amount of servings you plan to eat. For example, the label on a package of   bread might say that a serving size is 1 slice and that there are 90 calories in a serving. If you eat 1 slice, you will have eaten 90 calories. If you eat 2 slices, you will have eaten 180 calories. How do I keep a food log? Immediately after each meal, record the following information in your food log:  What you ate. Don't forget to include toppings, sauces, and other extras on the food.  How much you ate. This can be measured in cups, ounces, or number of items.  How many calories each food and drink had.  The total number of calories in the meal. Keep your food log near you, such as in a small notebook in your  pocket, or use a mobile app or website. Some programs will calculate calories for you and show you how many calories you have left for the day to meet your goal. What are some calorie counting tips?  Use your calories on foods and drinks that will fill you up and not leave you hungry:  Some examples of foods that fill you up are nuts and nut butters, vegetables, lean proteins, and high-fiber foods like whole grains. High-fiber foods are foods with more than 5 g fiber per serving.  Drinks such as sodas, specialty coffee drinks, alcohol, and juices have a lot of calories, yet do not fill you up.  Eat nutritious foods and avoid empty calories. Empty calories are calories you get from foods or beverages that do not have many vitamins or protein, such as candy, sweets, and soda. It is better to have a nutritious high-calorie food (such as an avocado) than a food with few nutrients (such as a bag of chips).  Know how many calories are in the foods you eat most often. This will help you calculate calorie counts faster.  Pay attention to calories in drinks. Low-calorie drinks include water and unsweetened drinks.  Pay attention to nutrition labels for "low fat" or "fat free" foods. These foods sometimes have the same amount of calories or more calories than the full fat versions. They also often have added sugar, starch, or salt, to make up for flavor that was removed with the fat.  Find a way of tracking calories that works for you. Get creative. Try different apps or programs if writing down calories does not work for you. What are some portion control tips?  Know how many calories are in a serving. This will help you know how many servings of a certain food you can have.  Use a measuring cup to measure serving sizes. You could also try weighing out portions on a kitchen scale. With time, you will be able to estimate serving sizes for some foods.  Take some time to put servings of different foods  on your favorite plates, bowls, and cups so you know what a serving looks like.  Try not to eat straight from a bag or box. Doing this can lead to overeating. Put the amount you would like to eat in a cup or on a plate to make sure you are eating the right portion.  Use smaller plates, glasses, and bowls to prevent overeating.  Try not to multitask (for example, watch TV or use your computer) while eating. If it is time to eat, sit down at a table and enjoy your food. This will help you to know when you are full. It will also help you to be aware of what you are eating and   how much you are eating. What are tips for following this plan? Reading food labels   Check the calorie count compared to the serving size. The serving size may be smaller than what you are used to eating.  Check the source of the calories. Make sure the food you are eating is high in vitamins and protein and low in saturated and trans fats. Shopping   Read nutrition labels while you shop. This will help you make healthy decisions before you decide to purchase your food.  Make a grocery list and stick to it. Cooking   Try to cook your favorite foods in a healthier way. For example, try baking instead of frying.  Use low-fat dairy products. Meal planning   Use more fruits and vegetables. Half of your plate should be fruits and vegetables.  Include lean proteins like poultry and fish. How do I count calories when eating out?  Ask for smaller portion sizes.  Consider sharing an entree and sides instead of getting your own entree.  If you get your own entree, eat only half. Ask for a box at the beginning of your meal and put the rest of your entree in it so you are not tempted to eat it.  If calories are listed on the menu, choose the lower calorie options.  Choose dishes that include vegetables, fruits, whole grains, low-fat dairy products, and lean protein.  Choose items that are boiled, broiled, grilled, or  steamed. Stay away from items that are buttered, battered, fried, or served with cream sauce. Items labeled "crispy" are usually fried, unless stated otherwise.  Choose water, low-fat milk, unsweetened iced tea, or other drinks without added sugar. If you want an alcoholic beverage, choose a lower calorie option such as a glass of wine or light beer.  Ask for dressings, sauces, and syrups on the side. These are usually high in calories, so you should limit the amount you eat.  If you want a salad, choose a garden salad and ask for grilled meats. Avoid extra toppings like bacon, cheese, or fried items. Ask for the dressing on the side, or ask for olive oil and vinegar or lemon to use as dressing.  Estimate how many servings of a food you are given. For example, a serving of cooked rice is  cup or about the size of half a baseball. Knowing serving sizes will help you be aware of how much food you are eating at restaurants. The list below tells you how big or small some common portion sizes are based on everyday objects:  1 oz-4 stacked dice.  3 oz-1 deck of cards.  1 tsp-1 die.  1 Tbsp- a ping-pong ball.  2 Tbsp-1 ping-pong ball.   cup- baseball.  1 cup-1 baseball. Summary  Calorie counting means keeping track of how many calories you eat and drink each day. If you eat fewer calories than your body needs, you should lose weight.  A healthy amount of weight to lose per week is usually 1-2 lb (0.5-0.9 kg). This usually means reducing your daily calorie intake by 500-750 calories.  The number of calories in a food can be found on a Nutrition Facts label. If a food does not have a Nutrition Facts label, try to look up the calories online or ask your dietitian for help.  Use your calories on foods and drinks that will fill you up, and not on foods and drinks that will leave you hungry.  Use smaller plates, glasses,   and bowls to prevent overeating. This information is not intended to  replace advice given to you by your health care provider. Make sure you discuss any questions you have with your health care provider. Document Released: 08/25/2005 Document Revised: 07/25/2016 Document Reviewed: 07/25/2016 Elsevier Interactive Patient Education  2017 [employer]  

## 2017-04-18 LAB — LIPID PANEL
Chol/HDL Ratio: 2.6 ratio (ref 0.0–4.4)
Cholesterol, Total: 169 mg/dL (ref 100–199)
HDL: 65 mg/dL (ref >39)
LDL Calculated: 86 mg/dL (ref 0–99)
Triglycerides: 92 mg/dL (ref 0–149)
VLDL Cholesterol Cal: 18 mg/dL (ref 5–40)

## 2017-04-18 LAB — COMPREHENSIVE METABOLIC PANEL
A/G RATIO: 1.6 (ref 1.2–2.2)
ALT: 14 IU/L (ref 0–32)
AST: 18 IU/L (ref 0–40)
Albumin: 4.1 g/dL (ref 3.5–5.5)
Alkaline Phosphatase: 100 IU/L (ref 39–117)
BILIRUBIN TOTAL: 0.4 mg/dL (ref 0.0–1.2)
BUN / CREAT RATIO: 17 (ref 9–23)
BUN: 13 mg/dL (ref 6–24)
CHLORIDE: 103 mmol/L (ref 96–106)
CO2: 18 mmol/L — ABNORMAL LOW (ref 20–29)
Calcium: 9.3 mg/dL (ref 8.7–10.2)
Creatinine, Ser: 0.78 mg/dL (ref 0.57–1.00)
GFR calc non Af Amer: 87 mL/min/{1.73_m2} (ref >59)
GFR, EST AFRICAN AMERICAN: 100 mL/min/{1.73_m2} (ref >59)
Globulin, Total: 2.6 g/dL (ref 1.5–4.5)
Glucose: 168 mg/dL — ABNORMAL HIGH (ref 65–99)
POTASSIUM: 4.6 mmol/L (ref 3.5–5.2)
SODIUM: 140 mmol/L (ref 134–144)
TOTAL PROTEIN: 6.7 g/dL (ref 6.0–8.5)

## 2017-04-18 LAB — CBC WITH DIFFERENTIAL/PLATELET

## 2017-04-18 LAB — MICROALBUMIN, URINE: Microalbumin, Urine: 4.5 ug/mL

## 2017-04-28 ENCOUNTER — Encounter: Payer: Self-pay | Admitting: Physician Assistant

## 2017-04-28 ENCOUNTER — Ambulatory Visit (INDEPENDENT_AMBULATORY_CARE_PROVIDER_SITE_OTHER): Payer: BLUE CROSS/BLUE SHIELD | Admitting: Physician Assistant

## 2017-04-28 VITALS — BP 122/78 | HR 84 | Temp 98.0°F | Resp 18 | Ht 64.5 in | Wt 346.0 lb

## 2017-04-28 DIAGNOSIS — L02214 Cutaneous abscess of groin: Secondary | ICD-10-CM

## 2017-04-28 DIAGNOSIS — M7989 Other specified soft tissue disorders: Secondary | ICD-10-CM | POA: Diagnosis not present

## 2017-04-28 DIAGNOSIS — M79645 Pain in left finger(s): Secondary | ICD-10-CM | POA: Diagnosis not present

## 2017-04-28 MED ORDER — DOXYCYCLINE HYCLATE 100 MG PO TABS: 100.0000 mg | ORAL_TABLET | Freq: Two times a day (BID) | ORAL | 0 refills | Status: DC

## 2017-04-28 NOTE — Patient Instructions (Addendum)
  Please do warm water soaks 3-4 times a day for 5-15 mins at a time in just warm/hot water.  This will allow the foreign body to come out if it is in there still.  Wear the finger splint as needed for pain control.  It is also ok to use tylenol or motrin for pain control.   IF you received an x-ray today, you will receive an invoice from [facility]. Please contact [facility] at [phone] with questions or concerns regarding your invoice.   IF you received labwork today, you will receive an invoice from [facility]. Please contact [facility] at [phone] with questions or concerns regarding your invoice.   Our billing staff will not be able to assist you with questions regarding bills from these companies.  You will be contacted with the lab results as soon as they are available. The fastest way to get your results is to activate your My Chart account. Instructions are located on the last page of this paperwork. If you have not heard from [place] regarding the results in 2 weeks, please contact this office.

## 2017-04-28 NOTE — Progress Notes (Signed)
Alicia Lucero  MRN: [id number] DOB: 1960-11-07  PCP: Ethelda Chick, MD  Chief Complaint  Patient presents with  . Hand Pain    left hand's thumb is swollen and infected, she saw Dr. Katrinka Blazing for it 2 weeks ago and she said to soak it and has been   . Cyst    two cysts on left groin area and would like to know if could have antiboitics     Subjective:  Pt presents to clinic for continued pain and swelling in her left thumb - she has tried to open to area but she has gotten nothing out of the area - she tried ice last night and she thinks that the swelling is slightly better.  She also has 2 small tender areas in her inguinal crease - she wonders if she has an abscess.    History is obtained by patient.  Review of Systems  Constitutional: Negative for chills and fever.    Patient Active Problem List   Diagnosis Date Noted  . Primary osteoarthritis of left knee 09/08/2016  . Type 2 diabetes mellitus with diabetic neuropathy, with long-term current use of insulin (HCC) 07/19/2015  . Dyslipidemia associated with type 2 diabetes mellitus (HCC) 07/19/2015  . Benign essential HTN 07/19/2015  . Bipolar 1 disorder (HCC) 03/01/2015  . Morbid obesity with BMI of 50.0-59.9, adult (HCC) 12/26/2014  . Asthma, chronic 09/21/2013    Current Outpatient Prescriptions on File Prior to Visit  Medication Sig Dispense Refill  . ACCU-CHEK AVIVA PLUS test strip USE AS DIRECTED 100 each 0  . ACCU-CHEK SOFTCLIX LANCETS lancets USE AS DIRECTED 100 each 1  . B Complex-C (B-COMPLEX WITH VITAMIN C) tablet Take 1 tablet by mouth daily.    . B-D ULTRAFINE III SHORT PEN 31G X 8 MM MISC use as directed 100 each 3  . Calcium-Magnesium-Vitamin D (CALCIUM 500 PO) Take by mouth daily.    . clonazePAM (KLONOPIN) 1 MG tablet Take 1 tablet (1 mg total) by mouth 2 (two) times daily as needed. 60 tablet 2  . fluticasone (FLONASE) 50 MCG/ACT nasal spray Place 2 sprays into both nostrils 2 (two) times daily. 16 g 11  .  gabapentin (NEURONTIN) 300 MG capsule TAKE 2 CAPSULES BY MOUTH THREE TIMES A DAY (Patient taking differently: TAKE 4 CAPSULES BY MOUTH THREE TIMES A DAY) 540 capsule 1  . GLUCOSA-CHONDR-NA CHONDR-MSM PO Take by mouth.    . Insulin Glargine (TOUJEO SOLOSTAR) 300 UNIT/ML SOPN Inject 50 Units into the skin daily. 9 pen 3  . ipratropium-albuterol (DUONEB) 0.5-2.5 (3) MG/3ML SOLN Take 3 mLs by nebulization every 6 (six) hours as needed. 360 mL 3  . ketoconazole (NIZORAL) 2 % cream Apply 1 application topically 2 (two) times daily. (Patient taking differently: Apply 1 application topically 2 (two) times daily as needed for irritation. ) 60 g 0  . lamoTRIgine (LAMICTAL) 200 MG tablet Take 2 tablets (400 mg total) by mouth every evening. 180 tablet 1  . liraglutide (VICTOZA) 18 MG/3ML SOPN INJECT 0.3 MILLILITERS (1.8 MG) INTO THE SKIN DAILY 9 pen 3  . lisinopril (PRINIVIL,ZESTRIL) 5 MG tablet Take 1 tablet (5 mg total) by mouth daily. 90 tablet 3  . meloxicam (MOBIC) 15 MG tablet Take 1 tablet (15 mg total) by mouth daily. 30 tablet 0  . metFORMIN (GLUCOPHAGE-XR) 500 MG 24 hr tablet take 2 tablets by mouth twice a day 120 tablet 5  . Multiple Vitamins-Minerals (WOMENS MULTIVITAMIN PLUS PO) Take 1  tablet by mouth daily.     . mupirocin ointment (BACTROBAN) 2 % apply to affected area twice a day 22 g 0  . NYAMYC powder apply topically four times a day 60 g 0  . nystatin cream (MYCOSTATIN) Apply 1 application topically 2 (two) times daily. 30 g 0  . Omega-3 Fatty Acids (FISH OIL PO) Take 2 capsules by mouth 2 (two) times daily.    Marland Kitchen OVER THE COUNTER MEDICATION Tumeric supplement taking daily    . ranitidine (ZANTAC) 150 MG capsule Take 1 capsule (150 mg total) by mouth 2 (two) times daily. 180 capsule 3  . sertraline (ZOLOFT) 100 MG tablet Take 2 tablets (200 mg total) by mouth daily. 180 tablet 1  . simvastatin (ZOCOR) 20 MG tablet Take 1 tablet (20 mg total) by mouth at bedtime. 90 tablet 3  . VENTOLIN HFA  108 (90 Base) MCG/ACT inhaler inhale 2 puffs by mouth every 4 hours if needed for cough wheezing or shortness of breath 18 g 1   No current facility-administered medications on file prior to visit.     Allergies  Allergen Reactions  . Empagliflozin Other (See Comments)    Causes yeast infection  . Empagliflozin-Linagliptin Other (See Comments)    Causes yeast infection  . Other Nausea Only    States she can't tolerate seafood smell  . Oxycodone     Sleepy, nausea, diarrhea, vomiting    Past Medical History:  Diagnosis Date  . Allergy   . Asthma   . Bipolar 1 disorder (HCC)   . Diabetes mellitus without complication (HCC)   . GERD (gastroesophageal reflux disease)   . Hyperlipidemia    Social History   Social History Narrative   Patient lives at home with her spouse.   Caffeine Use: 5-7 caffeine drinks daily   Social History  Substance Use Topics  . Smoking status: Former Smoker    Types: Cigarettes    Quit date: 06/30/2005  . Smokeless tobacco: Never Used     Comment: electronic cigarettes PRN  . Alcohol use No     Comment: quit: 2007   family history includes Cancer in her father; Cancer (age of onset: 5) in her mother; Diabetes in her sister; Heart disease (age of onset: 44) in her father; Hypertension in her father, mother, and sister; Mental illness in her mother; Multiple sclerosis in her sister.     Objective:  BP 122/78   Pulse 84   Temp 98 F (36.7 C) (Oral)   Resp 18   Ht 5' 4.5" (1.638 m)   Wt (!) 346 lb (156.9 kg)   LMP 04/27/2017   SpO2 96%   BMI 58.47 kg/m  Body mass index is 58.47 kg/m.  Physical Exam  Constitutional: She is oriented to person, place, and time and well-developed, well-nourished, and in no distress.  HENT:  Head: Normocephalic and atraumatic.  Right Ear: Hearing and external ear normal.  Left Ear: Hearing and external ear normal.  Eyes: Conjunctivae are normal.  Neck: Normal range of motion.  Pulmonary/Chest: Effort  normal.  Neurological: She is alert and oriented to person, place, and time. Gait normal.  Skin: Skin is warm and dry.  Left inguinal crease 1/2 cm tender papule with local irritation - mild erythema associated with the area  Left thumb swollen 1/2 cm area with open center with visible granulated tissue  Psychiatric: Mood, memory, affect and judgment normal.  Vitals reviewed.  Procedure:  Verbal consent obtained - digital  block with 2% lidocaine for local anesthesia.  #11 blade used to make a 1/2 cm X-shaped incision over the open wound - wound explored for FB removal - no FB felt or removed.  Drsg placed. Assessment and Plan :  Abscess of left groin - Plan: doxycycline (VIBRA-TABS) 100 MG tablet  Pain of left thumb - suspect FB - wound opened to allow for warm soaks to remove the FB if it is present - fold over splint placed to help with pain control - she can take tylenol/motrin prn for the pain.  Wound care d/w pt.  Swollen thumb -   Benny Lennert PA-C  Primary Care at [facility] 04/28/2017 10:39 AM

## 2017-05-07 ENCOUNTER — Encounter (INDEPENDENT_AMBULATORY_CARE_PROVIDER_SITE_OTHER): Payer: BLUE CROSS/BLUE SHIELD

## 2017-05-18 ENCOUNTER — Ambulatory Visit (INDEPENDENT_AMBULATORY_CARE_PROVIDER_SITE_OTHER): Payer: BLUE CROSS/BLUE SHIELD | Admitting: Family Medicine

## 2017-05-18 ENCOUNTER — Encounter (INDEPENDENT_AMBULATORY_CARE_PROVIDER_SITE_OTHER): Payer: Self-pay | Admitting: Family Medicine

## 2017-05-18 VITALS — BP 107/67 | HR 68 | Temp 97.9°F | Ht 64.0 in | Wt 340.0 lb

## 2017-05-18 DIAGNOSIS — F3181 Bipolar II disorder: Secondary | ICD-10-CM

## 2017-05-18 DIAGNOSIS — Z6841 Body Mass Index (BMI) 40.0 and over, adult: Secondary | ICD-10-CM

## 2017-05-18 DIAGNOSIS — IMO0001 Reserved for inherently not codable concepts without codable children: Secondary | ICD-10-CM

## 2017-05-18 DIAGNOSIS — Z0289 Encounter for other administrative examinations: Secondary | ICD-10-CM

## 2017-05-18 DIAGNOSIS — R0602 Shortness of breath: Secondary | ICD-10-CM

## 2017-05-18 DIAGNOSIS — E784 Other hyperlipidemia: Secondary | ICD-10-CM

## 2017-05-18 DIAGNOSIS — Z794 Long term (current) use of insulin: Secondary | ICD-10-CM

## 2017-05-18 DIAGNOSIS — E669 Obesity, unspecified: Secondary | ICD-10-CM | POA: Diagnosis not present

## 2017-05-18 DIAGNOSIS — E119 Type 2 diabetes mellitus without complications: Secondary | ICD-10-CM

## 2017-05-18 DIAGNOSIS — Z1389 Encounter for screening for other disorder: Secondary | ICD-10-CM | POA: Diagnosis not present

## 2017-05-18 DIAGNOSIS — R5383 Other fatigue: Secondary | ICD-10-CM | POA: Diagnosis not present

## 2017-05-18 DIAGNOSIS — E7849 Other hyperlipidemia: Secondary | ICD-10-CM

## 2017-05-18 DIAGNOSIS — Z1331 Encounter for screening for depression: Secondary | ICD-10-CM

## 2017-05-18 NOTE — Progress Notes (Signed)
Office: [phone]  /  Fax: [phone]   Dear Dr. Katrinka Lucero,   Thank you for referring Alicia Lucero to our clinic. The following note includes my evaluation and treatment recommendations.  HPI:   Chief Complaint: OBESITY    Alicia Lucero has been referred by Alicia Lucero. Alicia Blazing, MD for consultation regarding her obesity and obesity related comorbidities.    Alicia Lucero (MR# [id number]) is a 56 y.o. female who presents on 05/18/2017 for obesity evaluation and treatment. Current BMI is Body mass index is 58.36 kg/m.Marland Kitchen Alicia Lucero has been struggling with her weight for many years and has been unsuccessful in either losing weight, maintaining weight loss, or reaching her healthy weight goal.     Alicia Lucero attended our information session and states she is currently in the action stage of change and ready to dedicate time achieving and maintaining a healthier weight. Alicia Lucero is interested in becoming our patient and working on intensive lifestyle modifications including (but not limited to) diet, exercise and weight loss.    Alicia Lucero states her family eats meals together she thinks her family will eat healthier with  her she struggles with family and or coworkers weight loss sabotage her desired weight loss is 145 lbs she has been heavy most of  her life she started gaining weight at age 89 her heaviest weight ever was 427 lbs. she is a picky eater and doesn't like to eat healthier foods  she has significant food cravings issues  she snacks frequently in the evenings she skips meals frequently she frequently makes poor food choices she has problems with excessive hunger  she frequently eats larger portions than normal  she has binge eating behaviors she struggles with emotional eating    Alicia Lucero feels her energy is lower than it should be. This has worsened with weight gain and has not worsened recently. Alicia Lucero admits to daytime somnolence and  admits to waking up still tired. Patient is at risk for  obstructive sleep apnea. Patent has a history of symptoms of daytime Alicia and morning Alicia. Patient generally gets 8 hours of sleep per night, and states they generally have restful sleep. Snoring is present. Apneic episodes are not present. Epworth Sleepiness Score is 4  Dyspnea on exertion Alicia Lucero notes increasing shortness of breath with exercising and seems to be worsening over time with weight gain. She notes getting out of breath sooner with activity than she used to. This has not gotten worse recently. Alicia Lucero denies orthopnea.  Diabetes II Alicia Lucero has a diagnosis of diabetes type II. Alicia Lucero is on Toujeo 50 units qhs and Metformin but is not on Victoza. She denies any hypoglycemic episodes. She is attempting to work on intensive lifestyle modifications including diet, exercise, and weight loss to help control her blood glucose levels.  Hyperlipidemia Alicia Lucero has hyperlipidemia and has been trying to improve her cholesterol levels with intensive lifestyle modification including a low saturated fat diet, exercise and weight loss. She is taking Zocor currently and she denies any chest pain, claudication or myalgias.  Bipolar II Disorder Alicia Lucero has a diagnosis of bipolar II disorder and is followed by Psych and Psychologist. She has a history of hypomania, mostly depression. Alicia Lucero has significant emotional eating. She shows no sign of suicidal or homicidal ideations.  Depression Screen Alicia Lucero's Food and Mood (modified PHQ-9) score was  Depression screen PHQ 2/9 05/18/2017  Decreased Interest 3  Down, Depressed, Hopeless 3  PHQ - 2 Score 6  Altered sleeping 1  Tired, decreased energy  3  Change in appetite 2  Feeling bad or failure about yourself  3  Trouble concentrating 3  Moving slowly or fidgety/restless 3  Suicidal thoughts -  PHQ-9 Score 21  Difficult doing work/chores Very difficult    ALLERGIES: Allergies  Allergen Reactions  . Empagliflozin Other (See Comments)    Causes yeast  infection  . Empagliflozin-Linagliptin Other (See Comments)    Causes yeast infection  . Other Nausea Only    States she can't tolerate seafood smell  . Oxycodone     Sleepy, nausea, diarrhea, vomiting    MEDICATIONS: Current Outpatient Prescriptions on File Prior to Visit  Medication Sig Dispense Refill  . ACCU-CHEK AVIVA PLUS test strip USE AS DIRECTED 100 each 0  . ACCU-CHEK SOFTCLIX LANCETS lancets USE AS DIRECTED 100 each 1  . B Complex-C (B-COMPLEX WITH VITAMIN C) tablet Take 1 tablet by mouth daily.    . B-D ULTRAFINE III SHORT PEN 31G X 8 MM MISC use as directed 100 each 3  . clonazePAM (KLONOPIN) 1 MG tablet Take 1 tablet (1 mg total) by mouth 2 (two) times daily as needed. 60 tablet 2  . gabapentin (NEURONTIN) 300 MG capsule TAKE 2 CAPSULES BY MOUTH THREE TIMES A DAY (Patient taking differently: TAKE 4 CAPSULES BY MOUTH THREE TIMES A DAY) 540 capsule 1  . GLUCOSA-CHONDR-NA CHONDR-MSM PO Take by mouth.    . Insulin Glargine (TOUJEO SOLOSTAR) 300 UNIT/ML SOPN Inject 50 Units into the skin daily. 9 pen 3  . lamoTRIgine (LAMICTAL) 200 MG tablet Take 2 tablets (400 mg total) by mouth every evening. 180 tablet 1  . lisinopril (PRINIVIL,ZESTRIL) 5 MG tablet Take 1 tablet (5 mg total) by mouth daily. 90 tablet 3  . meloxicam (MOBIC) 15 MG tablet Take 1 tablet (15 mg total) by mouth daily. 30 tablet 0  . metFORMIN (GLUCOPHAGE-XR) 500 MG 24 hr tablet take 2 tablets by mouth twice a day 120 tablet 5  . Multiple Vitamins-Minerals (WOMENS MULTIVITAMIN PLUS PO) Take 1 tablet by mouth daily.     . Omega-3 Fatty Acids (FISH OIL PO) Take 2 capsules by mouth 2 (two) times daily.    . ranitidine (ZANTAC) 150 MG capsule Take 1 capsule (150 mg total) by mouth 2 (two) times daily. 180 capsule 3  . sertraline (ZOLOFT) 100 MG tablet Take 2 tablets (200 mg total) by mouth daily. 180 tablet 1  . simvastatin (ZOCOR) 20 MG tablet Take 1 tablet (20 mg total) by mouth at bedtime. 90 tablet 3  .  Calcium-Magnesium-Vitamin D (CALCIUM 500 PO) Take by mouth daily.    Marland Kitchen doxycycline (VIBRA-TABS) 100 MG tablet Take 1 tablet (100 mg total) by mouth 2 (two) times daily. (Patient not taking: Reported on 05/18/2017) 20 tablet 0  . fluticasone (FLONASE) 50 MCG/ACT nasal spray Place 2 sprays into both nostrils 2 (two) times daily. (Patient not taking: Reported on 05/18/2017) 16 g 11  . ipratropium-albuterol (DUONEB) 0.5-2.5 (3) MG/3ML SOLN Take 3 mLs by nebulization every 6 (six) hours as needed. (Patient not taking: Reported on 05/18/2017) 360 mL 3  . ketoconazole (NIZORAL) 2 % cream Apply 1 application topically 2 (two) times daily. (Patient not taking: Reported on 05/18/2017) 60 g 0  . mupirocin ointment (BACTROBAN) 2 % apply to affected area twice a day (Patient not taking: Reported on 05/18/2017) 22 g 0  . NYAMYC powder apply topically four times a day (Patient not taking: Reported on 05/18/2017) 60 g 0  . nystatin cream (  MYCOSTATIN) Apply 1 application topically 2 (two) times daily. (Patient not taking: Reported on 05/18/2017) 30 g 0  . OVER THE COUNTER MEDICATION Tumeric supplement taking daily    . VENTOLIN HFA 108 (90 Base) MCG/ACT inhaler inhale 2 puffs by mouth every 4 hours if needed for cough wheezing or shortness of breath (Patient not taking: Reported on 05/18/2017) 18 g 1   No current facility-administered medications on file prior to visit.     PAST MEDICAL HISTORY: Past Medical History:  Diagnosis Date  . Allergy   . Asthma   . Back pain   . Bipolar 1 disorder (HCC)   . Chest pain   . Depression   . Diabetes mellitus without complication (HCC)   . Drug use   . Dyspnea   . Fatty liver   . Gallbladder problem   . GERD (gastroesophageal reflux disease)   . Hyperlipidemia   . Infertility, female   . Joint pain   . Neuropathy   . PCOS (polycystic ovarian syndrome)   . Perimenopausal     PAST SURGICAL HISTORY: Past Surgical History:  Procedure Laterality Date  .  CHOLECYSTECTOMY  1996  . DILATION AND CURETTAGE OF UTERUS     secondary to menorrhagia  . KNEE ARTHROSCOPY Left 2008  . TONSILLECTOMY     as a child  . WISDOM TOOTH EXTRACTION      SOCIAL HISTORY: Social History  Substance Use Topics  . Smoking status: Former Smoker    Types: Cigarettes    Quit date: 06/30/2005  . Smokeless tobacco: Never Used     Comment: electronic cigarettes PRN  . Alcohol use No     Comment: quit: 2007    FAMILY HISTORY: Family History  Problem Relation Age of Onset  . Cancer Mother 36       lung  . Mental illness Mother        bipolar mania  . Hypertension Mother   . Depression Mother   . Cancer Father        liver  . Hypertension Father   . Heart disease Father 38       CAD/CABG  . Multiple sclerosis Sister   . Hypertension Sister   . Diabetes Sister     ROS: Review of Systems  Constitutional: Positive for malaise/Alicia.  HENT:       Dry Mouth  Eyes:       Vision Changes Wear Glasses or Contacts Blurry or Double Vision Floaters  Respiratory: Positive for shortness of breath (with activity).   Cardiovascular: Negative for chest pain, orthopnea and claudication.  Gastrointestinal: Positive for heartburn.  Musculoskeletal: Positive for back pain. Negative for myalgias.  Endo/Heme/Allergies:       Negative hypoglycemia  Psychiatric/Behavioral: Positive for depression. Negative for suicidal ideas. The patient is nervous/anxious (nervousness).        Stress    PHYSICAL EXAM: Blood pressure 107/67, pulse 68, temperature 97.9 F (36.6 C), temperature source Oral, height  (1.626 m), weight (!) 340 lb (154.2 kg), last menstrual period 04/21/2017, SpO2 97 %. Body mass index is 58.36 kg/m. Physical Exam  Constitutional: She is oriented to person, place, and time. She appears well-developed and well-nourished.  Cardiovascular: Normal rate.   Pulmonary/Chest: Effort normal.  Musculoskeletal: Normal range of motion.  Neurological:  She is oriented to person, place, and time.  Skin: Skin is warm and dry.  Psychiatric: She has a normal mood and affect. Her behavior is normal.  Vitals reviewed.  RECENT LABS AND TESTS: BMET    Component Value Date/Time   NA 140 04/17/2017 0925   K 4.6 04/17/2017 0925   CL 103 04/17/2017 0925   CO2 18 (L) 04/17/2017 0925   GLUCOSE 168 (H) 04/17/2017 0925   GLUCOSE 178 (H) 06/25/2016 1207   BUN 13 04/17/2017 0925   CREATININE 0.78 04/17/2017 0925   CREATININE 0.74 06/25/2016 1207   CALCIUM 9.3 04/17/2017 0925   GFRNONAA 87 04/17/2017 0925   GFRNONAA >89 04/06/2014 1310   GFRAA 100 04/17/2017 0925   GFRAA >89 04/06/2014 1310   Lab Results  Component Value Date   HGBA1C 7.4 04/17/2017   No results found for: INSULIN CBC    Component Value Date/Time   WBC CANCELED 04/17/2017 0925   WBC 11.3 (H) 06/25/2016 1207   RBC CANCELED 04/17/2017 0925   RBC 4.45 06/25/2016 1207   HGB CANCELED 04/17/2017 0925   HCT CANCELED 04/17/2017 0925   PLT CANCELED 04/17/2017 0925   MCV 84 08/27/2016 1148   MCH 27.5 08/27/2016 1148   MCH 28.3 06/25/2016 1207   MCHC 32.7 08/27/2016 1148   MCHC 32.6 06/25/2016 1207   RDW 14.6 08/27/2016 1148   LYMPHSABS CANCELED 04/17/2017 0925   MONOABS 678 06/25/2016 1207   EOSABS CANCELED 04/17/2017 0925   BASOSABS CANCELED 04/17/2017 0925   Iron/TIBC/Ferritin/ %Sat No results found for: IRON, TIBC, FERRITIN, IRONPCTSAT Lipid Panel     Component Value Date/Time   CHOL 169 04/17/2017 0925   TRIG 92 04/17/2017 0925   HDL 65 04/17/2017 0925   CHOLHDL 2.6 04/17/2017 0925   CHOLHDL 3.4 06/25/2016 1207   VLDL 27 06/25/2016 1207   LDLCALC 86 04/17/2017 0925   Hepatic Function Panel     Component Value Date/Time   PROT 6.7 04/17/2017 0925   ALBUMIN 4.1 04/17/2017 0925   AST 18 04/17/2017 0925   ALT 14 04/17/2017 0925   ALKPHOS 100 04/17/2017 0925   BILITOT 0.4 04/17/2017 0925      Component Value Date/Time   TSH 1.92 02/11/2016 1004   TSH  1.924 12/21/2014 1539   TSH 3.851 12/10/2012 1840    ECG  shows NSR with a rate of 67 BPM INDIRECT CALORIMETER done today shows a VO2 of 342 and a REE of 2387.  Her calculated basal metabolic rate is 1610 thus her basal metabolic rate is better than expected.    ASSESSMENT AND PLAN: Other Alicia - Plan: EKG 12-Lead, Vitamin B12, CBC With Differential, Folate, T3, T4, free, TSH, VITAMIN D 25 Hydroxy (Vit-D Deficiency, Fractures)  Shortness of breath on exertion  Type 2 diabetes mellitus without complication, with long-term current use of insulin (HCC) - Plan: Comprehensive metabolic panel, Hemoglobin A1c, Insulin, random, Microalbumin / creatinine urine ratio  Other hyperlipidemia - Plan: Lipid Panel With LDL/HDL Ratio  Bipolar 2 disorder (HCC)  Depression screening  Class 3 obesity with serious comorbidity and body mass index (BMI) of 50.0 to 59.9 in adult, unspecified obesity type (HCC)  PLAN: Alicia Alicia Lucero was informed that her Alicia may be related to obesity, depression or many other causes. Labs will be ordered, and in the meanwhile Alicia Lucero has agreed to work on diet, exercise and weight loss to help with Alicia. Proper sleep hygiene was discussed including the need for 7-8 hours of quality sleep each night. A sleep study was not ordered based on symptoms and Epworth score.  Dyspnea on exertion Alicia Lucero's shortness of breath appears to be obesity related and exercise induced. She  has agreed to work on weight loss and gradually increase exercise to treat her exercise induced shortness of breath. If Alicia Lucero follows our instructions and loses weight without improvement of her shortness of breath, we will plan to refer to pulmonology. We will monitor this condition regularly. Alicia Lucero agrees to this plan.  Diabetes II Alicia Lucero has been given extensive diabetes education by myself today including ideal fasting and post-prandial blood glucose readings, individual ideal Hgb A1c goals and hypoglycemia  prevention. We discussed the importance of good blood sugar control to decrease the likelihood of diabetic complications such as nephropathy, neuropathy, limb loss, blindness, coronary artery disease, and death. We discussed the importance of intensive lifestyle modification including diet, exercise and weight loss as the first line treatment for diabetes. We will check labs and Alicia Lucero was advised that it is okay to hold Victoza for now and she agreed to continue her other medications as prescribed. Alicia Lucero agrees to check BGs 2 times daily and will follow up at the agreed upon time.  Hyperlipidemia Alicia Lucero was informed of the American Heart Association Guidelines emphasizing intensive lifestyle modifications as the first line treatment for hyperlipidemia. We discussed many lifestyle modifications today in depth, and Alicia Lucero will continue to work on decreasing saturated fats such as fatty red meat, butter and many fried foods. She will also increase vegetables and lean protein in her diet and continue to work on exercise and weight loss efforts. We will check labs today. Alicia Lucero agrees to continue Crestor as prescribed and she will follow up with our clinic in 2 to 3 weeks.  Bipolar II Disorder Alicia Lucero will follow up with Psych and will watch for mood changes while working on decreasing emotional eating. She agrees to follow up with our clinic in 2 to 3 weeks.  Depression Screen Alicia Lucero had a strongly positive depression screening. Depression is commonly associated with obesity and often results in emotional eating behaviors. We will monitor this closely and work on CBT to help improve the non-hunger eating patterns. Referral to Psychology may be required if no improvement is seen as she continues in our clinic.  Obesity Alicia Lucero is currently in the action stage of change and her goal is to continue with weight loss efforts. I recommend Alicia Lucero begin the structured treatment plan as follows:  She has agreed to follow the  Category 3 plan +200 calories Alicia Lucero has been instructed to eventually work up to a goal of 150 minutes of combined cardio and strengthening exercise per week for weight loss and overall health benefits. We discussed the following Behavioral Modification Strategies today: increasing lean protein intake, decreasing simple carbohydrates  and work on meal planning and easy cooking plans   She was informed of the importance of frequent follow up visits to maximize her success with intensive lifestyle modifications for her multiple health conditions. She was informed we would discuss her lab results at her next visit unless there is a critical issue that needs to be addressed sooner. Alicia Lucero agreed to keep her next visit at the agreed upon time to discuss these results.  I, [place] CraneJoanne Murray, am acting as transcriptionist for  Quillian Quincearen Kainon Varady, MD  I have reviewed the above documentation for accuracy and completeness, and I agree with the above. -Quillian Quincearen Bayli Quesinberry, MD    OBESITY BEHAVIORAL INTERVENTION VISIT  Today's visit was # 1 out of 22.  Starting weight: 340 lbs Starting date: 05/18/17 Today's weight : 340 lbs Today's date: 05/18/2017 Total lbs lost to date: 0 (Patients must  lose 7 lbs in the first 6 months to continue with counseling)   ASK: We discussed the diagnosis of obesity with Alicia Lucero today and Alicia Lucero agreed to give [place] permission to discuss obesity behavioral modification therapy today.  ASSESS: Alicia Lucero has the diagnosis of obesity and her BMI today is 58.33 Nilah is in the action stage of change   ADVISE: Ziyah was educated on the multiple health risks of obesity as well as the benefit of weight loss to improve her health. She was advised of the need for long term treatment and the importance of lifestyle modifications.  AGREE: Multiple dietary modification options and treatment options were discussed and  Arden agreed to follow the Category 3 plan +200 calories We discussed the following  Behavioral Modification Strategies today: increasing lean protein intake, decreasing simple carbohydrates  and work on meal planning and easy cooking plans

## 2017-05-19 LAB — CBC WITH DIFFERENTIAL
BASOS: 0 %
Basophils Absolute: 0 10*3/uL (ref 0.0–0.2)
EOS (ABSOLUTE): 0.4 10*3/uL (ref 0.0–0.4)
Eos: 3 %
Hematocrit: 40.4 % (ref 34.0–46.6)
Hemoglobin: 13.2 g/dL (ref 11.1–15.9)
IMMATURE GRANS (ABS): 0 10*3/uL (ref 0.0–0.1)
Immature Granulocytes: 0 %
LYMPHS: 22 %
Lymphocytes Absolute: 2.7 10*3/uL (ref 0.7–3.1)
MCH: 28.3 pg (ref 26.6–33.0)
MCHC: 32.7 g/dL (ref 31.5–35.7)
MCV: 87 fL (ref 79–97)
Monocytes Absolute: 0.8 10*3/uL (ref 0.1–0.9)
Monocytes: 6 %
NEUTROS PCT: 69 %
Neutrophils Absolute: 8.1 10*3/uL — ABNORMAL HIGH (ref 1.4–7.0)
RBC: 4.66 x10E6/uL (ref 3.77–5.28)
RDW: 14.6 % (ref 12.3–15.4)
WBC: 12.1 10*3/uL — AB (ref 3.4–10.8)

## 2017-05-19 LAB — COMPREHENSIVE METABOLIC PANEL
ALK PHOS: 92 IU/L (ref 39–117)
ALT: 15 IU/L (ref 0–32)
AST: 12 IU/L (ref 0–40)
Albumin/Globulin Ratio: 1.5 (ref 1.2–2.2)
Albumin: 4.2 g/dL (ref 3.5–5.5)
BILIRUBIN TOTAL: 0.4 mg/dL (ref 0.0–1.2)
BUN/Creatinine Ratio: 18 (ref 9–23)
BUN: 15 mg/dL (ref 6–24)
CHLORIDE: 103 mmol/L (ref 96–106)
CO2: 21 mmol/L (ref 20–29)
Calcium: 9.4 mg/dL (ref 8.7–10.2)
Creatinine, Ser: 0.83 mg/dL (ref 0.57–1.00)
GFR calc Af Amer: 93 mL/min/{1.73_m2} (ref >59)
GFR calc non Af Amer: 81 mL/min/{1.73_m2} (ref >59)
GLUCOSE: 168 mg/dL — AB (ref 65–99)
Globulin, Total: 2.8 g/dL (ref 1.5–4.5)
POTASSIUM: 4.5 mmol/L (ref 3.5–5.2)
Sodium: 138 mmol/L (ref 134–144)
Total Protein: 7 g/dL (ref 6.0–8.5)

## 2017-05-19 LAB — T4, FREE: FREE T4: 0.95 ng/dL (ref 0.82–1.77)

## 2017-05-19 LAB — HEMOGLOBIN A1C
ESTIMATED AVERAGE GLUCOSE: 166 mg/dL
Hgb A1c MFr Bld: 7.4 % — ABNORMAL HIGH (ref 4.8–5.6)

## 2017-05-19 LAB — LIPID PANEL WITH LDL/HDL RATIO
Cholesterol, Total: 164 mg/dL (ref 100–199)
HDL: 67 mg/dL (ref >39)
LDL Calculated: 76 mg/dL (ref 0–99)
LDL/HDL RATIO: 1.1 ratio (ref 0.0–3.2)
Triglycerides: 106 mg/dL (ref 0–149)
VLDL Cholesterol Cal: 21 mg/dL (ref 5–40)

## 2017-05-19 LAB — VITAMIN D 25 HYDROXY (VIT D DEFICIENCY, FRACTURES): VIT D 25 HYDROXY: 38.8 ng/mL (ref 30.0–100.0)

## 2017-05-19 LAB — TSH: TSH: 2.7 u[IU]/mL (ref 0.450–4.500)

## 2017-05-19 LAB — MICROALBUMIN / CREATININE URINE RATIO
CREATININE, UR: 170.5 mg/dL
Microalb/Creat Ratio: 3 mg/g creat (ref 0.0–30.0)
Microalbumin, Urine: 5.2 ug/mL

## 2017-05-19 LAB — T3: T3 TOTAL: 86 ng/dL (ref 71–180)

## 2017-05-19 LAB — INSULIN, RANDOM: INSULIN: 6.9 u[IU]/mL (ref 2.6–24.9)

## 2017-05-19 LAB — VITAMIN B12: Vitamin B-12: 668 pg/mL (ref 232–1245)

## 2017-05-19 LAB — FOLATE

## 2017-05-23 ENCOUNTER — Encounter: Payer: Self-pay | Admitting: Family Medicine

## 2017-05-25 ENCOUNTER — Encounter (INDEPENDENT_AMBULATORY_CARE_PROVIDER_SITE_OTHER): Payer: Self-pay | Admitting: Family Medicine

## 2017-06-01 ENCOUNTER — Ambulatory Visit (INDEPENDENT_AMBULATORY_CARE_PROVIDER_SITE_OTHER): Payer: BLUE CROSS/BLUE SHIELD | Admitting: Family Medicine

## 2017-06-01 VITALS — BP 114/75 | HR 79 | Ht 64.0 in | Wt 338.0 lb

## 2017-06-01 DIAGNOSIS — F3181 Bipolar II disorder: Secondary | ICD-10-CM | POA: Insufficient documentation

## 2017-06-01 DIAGNOSIS — IMO0001 Reserved for inherently not codable concepts without codable children: Secondary | ICD-10-CM | POA: Insufficient documentation

## 2017-06-01 DIAGNOSIS — E559 Vitamin D deficiency, unspecified: Secondary | ICD-10-CM

## 2017-06-01 DIAGNOSIS — Z794 Long term (current) use of insulin: Secondary | ICD-10-CM | POA: Diagnosis not present

## 2017-06-01 DIAGNOSIS — Z6841 Body Mass Index (BMI) 40.0 and over, adult: Secondary | ICD-10-CM | POA: Diagnosis not present

## 2017-06-01 DIAGNOSIS — E669 Obesity, unspecified: Secondary | ICD-10-CM | POA: Diagnosis not present

## 2017-06-01 DIAGNOSIS — E11319 Type 2 diabetes mellitus with unspecified diabetic retinopathy without macular edema: Secondary | ICD-10-CM | POA: Diagnosis not present

## 2017-06-01 DIAGNOSIS — F3175 Bipolar disorder, in partial remission, most recent episode depressed: Secondary | ICD-10-CM | POA: Insufficient documentation

## 2017-06-01 HISTORY — DX: Type 2 diabetes mellitus with unspecified diabetic retinopathy without macular edema: E11.319

## 2017-06-01 MED ORDER — LIRAGLUTIDE 18 MG/3ML ~~LOC~~ SOPN: 0.6000 mg | PEN_INJECTOR | SUBCUTANEOUS | 0 refills | Status: DC

## 2017-06-01 MED ORDER — VITAMIN D 50 MCG (2000 UT) PO TABS: 2000.0000 [IU] | ORAL_TABLET | Freq: Every day | ORAL | 0 refills | Status: DC

## 2017-06-01 NOTE — Progress Notes (Signed)
Office: [phone]  /  Fax: [phone]   HPI:   Chief Complaint: OBESITY Alicia Lucero is here to discuss her progress with her obesity treatment plan. She is on the  follow the Category 3 plan and is following her eating plan approximately 0 % of the time. She states she is exercising 0 minutes 0 times per week. Alicia Lucero is currently struggling with following her plan, she made lots of substitutes, and had an increase in emotional eating. She still was able to lose some weight because she tried to portion control and make smart choices. She ate too many carbs and only about half of her protein requirement. Her weight is (!) 338 lb (153.3 kg) today and has had a weight loss of 2 pounds over a period of 3 weeks since her last visit. She has lost 2 lbs since starting treatment with [place].  Diabetes II Alicia Lucero has a diagnosis of diabetes type II and on insulin. Alicia Lucero  BGs range between 140 and 250, did not report any BS at this time, 2 hour post prandial 123-285  and does not report any any hypoglycemic episodes. Patient is uncontrolled. She has peripheral neuropathy and retinopathy. Last A1c was Hemoglobin A1C Latest Ref Rng & Units 05/18/2017 04/17/2017 12/24/2016 08/27/2016 06/25/2016  HGBA1C 4.8 - 5.6 % 7.4(H) 7.4 7.2 6.8(H) 7.5  Some recent data might be hidden    She has been working on intensive lifestyle modifications including diet, exercise, and weight loss to help control her blood glucose levels.  Bipolar Patient recently started Vraylar and mood seems more stable but notes an increase in food cravings which is an expected side effect  Vitamin D deficiency Alicia Lucero has a diagnosis of vitamin D deficiency. She is not currently taking vit D and denies nausea, vomiting or muscle weakness.  ALLERGIES: Allergies  Allergen Reactions  . Empagliflozin Other (See Comments)    Causes yeast infection  . Empagliflozin-Linagliptin Other (See Comments)    Causes yeast infection  . Other Nausea Only    States  she can't tolerate seafood smell  . Oxycodone     Sleepy, nausea, diarrhea, vomiting    MEDICATIONS: Current Outpatient Prescriptions on File Prior to Visit  Medication Sig Dispense Refill  . ACCU-CHEK AVIVA PLUS test strip USE AS DIRECTED 100 each 0  . ACCU-CHEK SOFTCLIX LANCETS lancets USE AS DIRECTED 100 each 1  . B Complex-C (B-COMPLEX WITH VITAMIN C) tablet Take 1 tablet by mouth daily.    . B-D ULTRAFINE III SHORT PEN 31G X 8 MM MISC use as directed 100 each 3  . Calcium-Magnesium-Vitamin D (CALCIUM 500 PO) Take by mouth daily.    . clonazePAM (KLONOPIN) 1 MG tablet Take 1 tablet (1 mg total) by mouth 2 (two) times daily as needed. 60 tablet 2  . doxycycline (VIBRA-TABS) 100 MG tablet Take 1 tablet (100 mg total) by mouth 2 (two) times daily. 20 tablet 0  . fluticasone (FLONASE) 50 MCG/ACT nasal spray Place 2 sprays into both nostrils 2 (two) times daily. 16 g 11  . gabapentin (NEURONTIN) 300 MG capsule TAKE 2 CAPSULES BY MOUTH THREE TIMES A DAY (Patient taking differently: TAKE 4 CAPSULES BY MOUTH THREE TIMES A DAY) 540 capsule 1  . GLUCOSA-CHONDR-NA CHONDR-MSM PO Take by mouth.    . Insulin Glargine (TOUJEO SOLOSTAR) 300 UNIT/ML SOPN Inject 50 Units into the skin daily. 9 pen 3  . ipratropium-albuterol (DUONEB) 0.5-2.5 (3) MG/3ML SOLN Take 3 mLs by nebulization every 6 (six) hours  as needed. 360 mL 3  . ketoconazole (NIZORAL) 2 % cream Apply 1 application topically 2 (two) times daily. 60 g 0  . lamoTRIgine (LAMICTAL) 200 MG tablet Take 2 tablets (400 mg total) by mouth every evening. 180 tablet 1  . lisinopril (PRINIVIL,ZESTRIL) 5 MG tablet Take 1 tablet (5 mg total) by mouth daily. 90 tablet 3  . meloxicam (MOBIC) 15 MG tablet Take 1 tablet (15 mg total) by mouth daily. 30 tablet 0  . metFORMIN (GLUCOPHAGE-XR) 500 MG 24 hr tablet take 2 tablets by mouth twice a day 120 tablet 5  . Multiple Vitamins-Minerals (WOMENS MULTIVITAMIN PLUS PO) Take 1 tablet by mouth daily.     .  mupirocin ointment (BACTROBAN) 2 % apply to affected area twice a day 22 g 0  . NYAMYC powder apply topically four times a day 60 g 0  . nystatin cream (MYCOSTATIN) Apply 1 application topically 2 (two) times daily. 30 g 0  . Omega-3 Fatty Acids (FISH OIL PO) Take 2 capsules by mouth 2 (two) times daily.    Marland Kitchen OVER THE COUNTER MEDICATION Tumeric supplement taking daily    . ranitidine (ZANTAC) 150 MG capsule Take 1 capsule (150 mg total) by mouth 2 (two) times daily. 180 capsule 3  . sertraline (ZOLOFT) 100 MG tablet Take 2 tablets (200 mg total) by mouth daily. 180 tablet 1  . simvastatin (ZOCOR) 20 MG tablet Take 1 tablet (20 mg total) by mouth at bedtime. 90 tablet 3  . VENTOLIN HFA 108 (90 Base) MCG/ACT inhaler inhale 2 puffs by mouth every 4 hours if needed for cough wheezing or shortness of breath 18 g 1   No current facility-administered medications on file prior to visit.     PAST MEDICAL HISTORY: Past Medical History:  Diagnosis Date  . Allergy   . Asthma   . Back pain   . Bipolar 1 disorder (HCC)   . Chest pain   . Depression   . Diabetes mellitus without complication (HCC)   . Drug use   . Dyspnea   . Fatty liver   . Gallbladder problem   . GERD (gastroesophageal reflux disease)   . Hyperlipidemia   . Infertility, female   . Joint pain   . Neuropathy   . PCOS (polycystic ovarian syndrome)   . Perimenopausal     PAST SURGICAL HISTORY: Past Surgical History:  Procedure Laterality Date  . CHOLECYSTECTOMY  1996  . DILATION AND CURETTAGE OF UTERUS     secondary to menorrhagia  . KNEE ARTHROSCOPY Left 2008  . TONSILLECTOMY     as a child  . WISDOM TOOTH EXTRACTION      SOCIAL HISTORY: Social History  Substance Use Topics  . Smoking status: Former Smoker    Types: Cigarettes    Quit date: 06/30/2005  . Smokeless tobacco: Never Used     Comment: electronic cigarettes PRN  . Alcohol use No     Comment: quit: 2007    FAMILY HISTORY: Family History    Problem Relation Age of Onset  . Cancer Mother 61       lung  . Mental illness Mother        bipolar mania  . Hypertension Mother   . Depression Mother   . Cancer Father        liver  . Hypertension Father   . Heart disease Father 31       CAD/CABG  . Multiple sclerosis Sister   .  Hypertension Sister   . Diabetes Sister     ROS: Review of Systems  Constitutional: Positive for weight loss.  All other systems reviewed and are negative.   PHYSICAL EXAM: Blood pressure 114/75, pulse 79, height  (1.626 m), weight (!) 338 lb (153.3 kg), last menstrual period 04/21/2017, SpO2 95 %. Body mass index is 58.02 kg/m. Physical Exam  Constitutional: She is oriented to person, place, and time. She appears well-developed and well-nourished.  HENT:  Head: Normocephalic.  Eyes: EOM are normal.  Neck: Normal range of motion.  Cardiovascular: Normal rate.   Pulmonary/Chest: Effort normal.  Musculoskeletal: Normal range of motion.  Neurological: She is alert and oriented to person, place, and time.  Skin: Skin is warm and dry.  Psychiatric: She has a normal mood and affect.  Vitals reviewed.   RECENT LABS AND TESTS: BMET    Component Value Date/Time   NA 138 05/18/2017 1022   K 4.5 05/18/2017 1022   CL 103 05/18/2017 1022   CO2 21 05/18/2017 1022   GLUCOSE 168 (H) 05/18/2017 1022   GLUCOSE 178 (H) 06/25/2016 1207   BUN 15 05/18/2017 1022   CREATININE 0.83 05/18/2017 1022   CREATININE 0.74 06/25/2016 1207   CALCIUM 9.4 05/18/2017 1022   GFRNONAA 81 05/18/2017 1022   GFRNONAA >89 04/06/2014 1310   GFRAA 93 05/18/2017 1022   GFRAA >89 04/06/2014 1310   Lab Results  Component Value Date   HGBA1C 7.4 (H) 05/18/2017   HGBA1C 7.4 04/17/2017   HGBA1C 7.2 12/24/2016   HGBA1C 6.8 (H) 08/27/2016   HGBA1C 7.5 06/25/2016   Lab Results  Component Value Date   INSULIN 6.9 05/18/2017   CBC    Component Value Date/Time   WBC 12.1 (H) 05/18/2017 1022   WBC 11.3 (H)  06/25/2016 1207   RBC 4.66 05/18/2017 1022   RBC 4.45 06/25/2016 1207   HGB 13.2 05/18/2017 1022   HCT 40.4 05/18/2017 1022   PLT CANCELED 04/17/2017 0925   MCV 87 05/18/2017 1022   MCH 28.3 05/18/2017 1022   MCH 28.3 06/25/2016 1207   MCHC 32.7 05/18/2017 1022   MCHC 32.6 06/25/2016 1207   RDW 14.6 05/18/2017 1022   LYMPHSABS 2.7 05/18/2017 1022   MONOABS 678 06/25/2016 1207   EOSABS 0.4 05/18/2017 1022   BASOSABS 0.0 05/18/2017 1022   Iron/TIBC/Ferritin/ %Sat No results found for: IRON, TIBC, FERRITIN, IRONPCTSAT Lipid Panel     Component Value Date/Time   CHOL 164 05/18/2017 1022   TRIG 106 05/18/2017 1022   HDL 67 05/18/2017 1022   CHOLHDL 2.6 04/17/2017 0925   CHOLHDL 3.4 06/25/2016 1207   VLDL 27 06/25/2016 1207   LDLCALC 76 05/18/2017 1022   Hepatic Function Panel     Component Value Date/Time   PROT 7.0 05/18/2017 1022   ALBUMIN 4.2 05/18/2017 1022   AST 12 05/18/2017 1022   ALT 15 05/18/2017 1022   ALKPHOS 92 05/18/2017 1022   BILITOT 0.4 05/18/2017 1022      Component Value Date/Time   TSH 2.700 05/18/2017 1022   TSH 1.92 02/11/2016 1004   TSH 1.924 12/21/2014 1539    ASSESSMENT AND PLAN: Type 2 diabetes mellitus with retinopathy, with long-term current use of insulin, macular edema presence unspecified, unspecified laterality, unspecified retinopathy severity (HCC) - Plan: liraglutide (VICTOZA) 18 MG/3ML SOPN  Vitamin D deficiency - Plan: Cholecalciferol (VITAMIN D) 2000 units tablet  Bipolar 2 disorder (HCC)  Class 3 obesity with serious comorbidity and body  mass index (BMI) of 50.0 to 59.9 in adult, unspecified obesity type (HCC)  PLAN:  Diabetes II Alicia Lucero has been given extensive diabetes education by myself today including ideal fasting and post-prandial blood glucose readings, individual ideal HgA1c goals  and hypoglycemia prevention. We discussed the importance of good blood sugar control to decrease the likelihood of diabetic complications  such as nephropathy, neuropathy, limb loss, blindness, coronary artery disease, and death. We discussed the importance of intensive lifestyle modification including diet, exercise and weight loss as the first line treatment for diabetes. Alicia Lucero agrees to continue her diabetes medications and will follow up at the agreed upon time. She will restart Victoza at 0.6mg  every morning, prescription written today with 1 pen and 0 refills. She will continue the metformin and Toujeo.   Bipolar Will continue medications, discussed emotional eating and strategies to deal with this. Will follow up.   Vitamin D Deficiency Alicia Lucero was informed that low vitamin D levels contributes to fatigue and are associated with obesity, breast, and colon cancer. She agrees to continue to take prescription Vit D ,000 IU every week and will follow up for routine testing of vitamin D, at least 2-3 times per year. She was informed of the risk of over-replacement of vitamin D and agrees to not increase her dose unless he discusses this with [place] first.  Obesity Alicia Lucero is currently in the action stage of change. As such, her goal is to continue with weight loss efforts She has agreed to keep a food journal with 1500-1800 calories and 100 protein  Alicia Lucero has been instructed to work up to a goal of 150 minutes of combined cardio and strengthening exercise per week for weight loss and overall health benefits. We discussed the following Behavioral Modification Stratagies today: increasing lean protein intake, decreasing simple carbohydrates  and emotional eating strategies  Alicia Lucero has agreed to follow up with our clinic in 3 weeks. She was informed of the importance of frequent follow up visits to maximize her success with intensive lifestyle modifications for her multiple health conditions.  I, April Moore , am acting as Energy manager for Quillian Quince, MD  I have reviewed the above documentation for accuracy and completeness, and I agree  with the above. -Quillian Quince, MD

## 2017-06-15 ENCOUNTER — Ambulatory Visit (INDEPENDENT_AMBULATORY_CARE_PROVIDER_SITE_OTHER): Payer: BLUE CROSS/BLUE SHIELD | Admitting: Physician Assistant

## 2017-06-15 VITALS — BP 104/69 | HR 75 | Temp 98.0°F | Ht 64.0 in | Wt 352.0 lb

## 2017-06-15 DIAGNOSIS — E119 Type 2 diabetes mellitus without complications: Secondary | ICD-10-CM

## 2017-06-15 DIAGNOSIS — Z794 Long term (current) use of insulin: Secondary | ICD-10-CM

## 2017-06-15 DIAGNOSIS — Z6841 Body Mass Index (BMI) 40.0 and over, adult: Secondary | ICD-10-CM | POA: Diagnosis not present

## 2017-06-15 NOTE — Progress Notes (Signed)
Office: [phone]  /  Fax: [phone]   HPI:   Chief Complaint: OBESITY Alicia Lucero is here to discuss her progress with her obesity treatment plan. She is on the keep a food journal with 1500 to 1800 calories and 100 grams of protein daily and is following her eating plan approximately 0 % of the time. She states she is walking for 30 minutes 2 times per week. Alicia Lucero has not been following the plan and states her hunger is not controlled. She states she is determined about weight loss but has not been planning her meals. States she notice the increased snacking and cravings after recent addition of Vraylar for her Bipolar.  States she is scheduled to see her Psychiatrist for management of her Bipolar and potential change of pscyhiatric medications.   Her weight is (!) 352 lb (159.7 kg) today and has had a weight gain of 14 pounds over a period of 2 weeks since her last visit. She has lost 0 lbs since starting treatment with [place].  Diabetes II Alicia Lucero has a diagnosis of diabetes type II. Alicia Lucero states fasting BGs range between 140's and 300's and denies any hypoglycemic episodes. Last A1c was at 7.4 She has been working on intensive lifestyle modifications including diet, exercise, and weight loss to help control her blood glucose levels.  ALLERGIES: Allergies  Allergen Reactions  . Empagliflozin Other (See Comments)    Causes yeast infection  . Empagliflozin-Linagliptin Other (See Comments)    Causes yeast infection  . Other Nausea Only    States she can't tolerate seafood smell  . Oxycodone     Sleepy, nausea, diarrhea, vomiting    MEDICATIONS: Current Outpatient Prescriptions on File Prior to Visit  Medication Sig Dispense Refill  . ACCU-CHEK AVIVA PLUS test strip USE AS DIRECTED 100 each 0  . ACCU-CHEK SOFTCLIX LANCETS lancets USE AS DIRECTED 100 each 1  . B Complex-C (B-COMPLEX WITH VITAMIN C) tablet Take 1 tablet by mouth daily.    . B-D ULTRAFINE III SHORT PEN 31G X 8 MM MISC use as  directed 100 each 3  . Cariprazine HCl (VRAYLAR) 1.5 MG CAPS Take 1 capsule by mouth every other day.    . Cholecalciferol (VITAMIN D) 2000 units tablet Take 1 tablet (2,000 Units total) by mouth daily. 30 tablet 0  . clonazePAM (KLONOPIN) 1 MG tablet Take 1 tablet (1 mg total) by mouth 2 (two) times daily as needed. 60 tablet 2  . gabapentin (NEURONTIN) 300 MG capsule TAKE 2 CAPSULES BY MOUTH THREE TIMES A DAY (Patient taking differently: TAKE 4 CAPSULES BY MOUTH THREE TIMES A DAY) 540 capsule 1  . GLUCOSA-CHONDR-NA CHONDR-MSM PO Take by mouth.    . Insulin Glargine (TOUJEO SOLOSTAR) 300 UNIT/ML SOPN Inject 50 Units into the skin daily. 9 pen 3  . ipratropium-albuterol (DUONEB) 0.5-2.5 (3) MG/3ML SOLN Take 3 mLs by nebulization every 6 (six) hours as needed. 360 mL 3  . ketoconazole (NIZORAL) 2 % cream Apply 1 application topically 2 (two) times daily. 60 g 0  . lamoTRIgine (LAMICTAL) 200 MG tablet Take 2 tablets (400 mg total) by mouth every evening. 180 tablet 1  . liraglutide (VICTOZA) 18 MG/3ML SOPN Inject 0.1 mLs (0.6 mg total) into the skin every morning. 1 pen 0  . lisinopril (PRINIVIL,ZESTRIL) 5 MG tablet Take 1 tablet (5 mg total) by mouth daily. 90 tablet 3  . meloxicam (MOBIC) 15 MG tablet Take 1 tablet (15 mg total) by mouth daily. 30 tablet  0  . metFORMIN (GLUCOPHAGE-XR) 500 MG 24 hr tablet take 2 tablets by mouth twice a day 120 tablet 5  . Multiple Vitamins-Minerals (WOMENS MULTIVITAMIN PLUS PO) Take 1 tablet by mouth daily.     . mupirocin ointment (BACTROBAN) 2 % apply to affected area twice a day 22 g 0  . NYAMYC powder apply topically four times a day 60 g 0  . nystatin cream (MYCOSTATIN) Apply 1 application topically 2 (two) times daily. 30 g 0  . Omega-3 Fatty Acids (FISH OIL PO) Take 2 capsules by mouth 2 (two) times daily.    Marland Kitchen OVER THE COUNTER MEDICATION Tumeric supplement taking daily    . ranitidine (ZANTAC) 150 MG capsule Take 1 capsule (150 mg total) by mouth 2 (two)  times daily. 180 capsule 3  . sertraline (ZOLOFT) 100 MG tablet Take 2 tablets (200 mg total) by mouth daily. 180 tablet 1  . simvastatin (ZOCOR) 20 MG tablet Take 1 tablet (20 mg total) by mouth at bedtime. 90 tablet 3  . VENTOLIN HFA 108 (90 Base) MCG/ACT inhaler inhale 2 puffs by mouth every 4 hours if needed for cough wheezing or shortness of breath 18 g 1   No current facility-administered medications on file prior to visit.     PAST MEDICAL HISTORY: Past Medical History:  Diagnosis Date  . Allergy   . Asthma   . Back pain   . Bipolar 1 disorder (HCC)   . Chest pain   . Depression   . Diabetes mellitus without complication (HCC)   . Drug use   . Dyspnea   . Fatty liver   . Gallbladder problem   . GERD (gastroesophageal reflux disease)   . Hyperlipidemia   . Infertility, female   . Joint pain   . Neuropathy   . PCOS (polycystic ovarian syndrome)   . Perimenopausal     PAST SURGICAL HISTORY: Past Surgical History:  Procedure Laterality Date  . CHOLECYSTECTOMY  1996  . DILATION AND CURETTAGE OF UTERUS     secondary to menorrhagia  . KNEE ARTHROSCOPY Left 2008  . TONSILLECTOMY     as a child  . WISDOM TOOTH EXTRACTION      SOCIAL HISTORY: Social History  Substance Use Topics  . Smoking status: Former Smoker    Types: Cigarettes    Quit date: 06/30/2005  . Smokeless tobacco: Never Used     Comment: electronic cigarettes PRN  . Alcohol use No     Comment: quit: 2007    FAMILY HISTORY: Family History  Problem Relation Age of Onset  . Cancer Mother 76       lung  . Mental illness Mother        bipolar mania  . Hypertension Mother   . Depression Mother   . Cancer Father        liver  . Hypertension Father   . Heart disease Father 37       CAD/CABG  . Multiple sclerosis Sister   . Hypertension Sister   . Diabetes Sister     ROS: Review of Systems  Constitutional: Negative for weight loss.  Endo/Heme/Allergies:       Negative hypoglycemia     PHYSICAL EXAM: Blood pressure 104/69, pulse 75, temperature 98 F (36.7 C), temperature source Oral, height  (1.626 m), weight (!) 352 lb (159.7 kg), SpO2 97 %. Body mass index is 60.42 kg/m. Physical Exam  Constitutional: She is oriented to person, place, and time.  She appears well-developed and well-nourished.  Cardiovascular: Normal rate.   Pulmonary/Chest: Effort normal.  Musculoskeletal: Normal range of motion.  Neurological: She is oriented to person, place, and time.  Skin: Skin is warm and dry.  Psychiatric: She has a normal mood and affect. Her behavior is normal.  Vitals reviewed.   RECENT LABS AND TESTS: BMET    Component Value Date/Time   NA 138 05/18/2017 1022   K 4.5 05/18/2017 1022   CL 103 05/18/2017 1022   CO2 21 05/18/2017 1022   GLUCOSE 168 (H) 05/18/2017 1022   GLUCOSE 178 (H) 06/25/2016 1207   BUN 15 05/18/2017 1022   CREATININE 0.83 05/18/2017 1022   CREATININE 0.74 06/25/2016 1207   CALCIUM 9.4 05/18/2017 1022   GFRNONAA 81 05/18/2017 1022   GFRNONAA >89 04/06/2014 1310   GFRAA 93 05/18/2017 1022   GFRAA >89 04/06/2014 1310   Lab Results  Component Value Date   HGBA1C 7.4 (H) 05/18/2017   HGBA1C 7.4 04/17/2017   HGBA1C 7.2 12/24/2016   HGBA1C 6.8 (H) 08/27/2016   HGBA1C 7.5 06/25/2016   Lab Results  Component Value Date   INSULIN 6.9 05/18/2017   CBC    Component Value Date/Time   WBC 12.1 (H) 05/18/2017 1022   WBC 11.3 (H) 06/25/2016 1207   RBC 4.66 05/18/2017 1022   RBC 4.45 06/25/2016 1207   HGB 13.2 05/18/2017 1022   HCT 40.4 05/18/2017 1022   PLT CANCELED 04/17/2017 0925   MCV 87 05/18/2017 1022   MCH 28.3 05/18/2017 1022   MCH 28.3 06/25/2016 1207   MCHC 32.7 05/18/2017 1022   MCHC 32.6 06/25/2016 1207   RDW 14.6 05/18/2017 1022   LYMPHSABS 2.7 05/18/2017 1022   MONOABS 678 06/25/2016 1207   EOSABS 0.4 05/18/2017 1022   BASOSABS 0.0 05/18/2017 1022   Iron/TIBC/Ferritin/ %Sat No results found for: IRON, TIBC,  FERRITIN, IRONPCTSAT Lipid Panel     Component Value Date/Time   CHOL 164 05/18/2017 1022   TRIG 106 05/18/2017 1022   HDL 67 05/18/2017 1022   CHOLHDL 2.6 04/17/2017 0925   CHOLHDL 3.4 06/25/2016 1207   VLDL 27 06/25/2016 1207   LDLCALC 76 05/18/2017 1022   Hepatic Function Panel     Component Value Date/Time   PROT 7.0 05/18/2017 1022   ALBUMIN 4.2 05/18/2017 1022   AST 12 05/18/2017 1022   ALT 15 05/18/2017 1022   ALKPHOS 92 05/18/2017 1022   BILITOT 0.4 05/18/2017 1022      Component Value Date/Time   TSH 2.700 05/18/2017 1022   TSH 1.92 02/11/2016 1004   TSH 1.924 12/21/2014 1539    ASSESSMENT AND PLAN: Type 2 diabetes mellitus without complication, with long-term current use of insulin (HCC)  Class 3 severe obesity with serious comorbidity and body mass index (BMI) of 60.0 to 69.9 in adult, unspecified obesity type (HCC)  PLAN:  Diabetes II Alicia Lucero has been given extensive diabetes education by myself today including ideal fasting and post-prandial blood glucose readings, individual ideal Hgb A1c goals  and hypoglycemia prevention. We discussed the importance of good blood sugar control to decrease the likelihood of diabetic complications such as nephropathy, neuropathy, limb loss, blindness, coronary artery disease, and death. We discussed the importance of intensive lifestyle modification including diet, exercise and weight loss as the first line treatment for diabetes. Alicia Lucero agrees to continue her diabetes medications and will follow up at the agreed upon time.  We spent > than 50% of the 15 minute  visit on the counseling as documented in the note.  Obesity Alicia Lucero is currently in the action stage of change. As such, her goal is to continue with weight loss efforts She has agreed to keep a food journal with 1800 calories and 100 grams of protein daily Alicia Lucero has been instructed to work up to a goal of 150 minutes of combined cardio and strengthening exercise per week for  weight loss and overall health benefits. We discussed the following Behavioral Modification Strategies today: increasing lean protein intake and keeping healthy foods in the home  Alicia Lucero has agreed to follow up with our clinic in 2 weeks. She was informed of the importance of frequent follow up visits to maximize her success with intensive lifestyle modifications for her multiple health conditions.  I, Nevada Crane, am acting as transcriptionist for Illa Level, PA-C  I have reviewed the above documentation for accuracy and completeness, and I agree with the above. -Illa Level, PA-C  I have reviewed the above note and agree with the plan. -Quillian Quince, MD   OBESITY BEHAVIORAL INTERVENTION VISIT  Today's visit was # 2 out of 22.  Starting weight: 340 lbs Starting date: 05/18/17 Today's weight : 352 lbs Today's date: 06/15/2017 Total lbs lost to date: 0 (Patients must lose 7 lbs in the first 6 months to continue with counseling)   ASK: We discussed the diagnosis of obesity with Alicia Lucero today and Alicia Lucero agreed to give [place] permission to discuss obesity behavioral modification therapy today.  ASSESS: Alicia Lucero has the diagnosis of obesity and her BMI today is 60.39 Alicia Lucero is in the action stage of change   ADVISE: Alicia Lucero was educated on the multiple health risks of obesity as well as the benefit of weight loss to improve her health. She was advised of the need for long term treatment and the importance of lifestyle modifications.  AGREE: Multiple dietary modification options and treatment options were discussed and  Alicia Lucero agreed to keep a food journal with 1800 calories and 100 grams of protein daily We discussed the following Behavioral Modification Strategies today: increasing lean protein intake and keeping healthy foods in the home

## 2017-06-25 ENCOUNTER — Other Ambulatory Visit: Payer: Self-pay | Admitting: Family Medicine

## 2017-06-30 ENCOUNTER — Ambulatory Visit (INDEPENDENT_AMBULATORY_CARE_PROVIDER_SITE_OTHER): Payer: BLUE CROSS/BLUE SHIELD | Admitting: Physician Assistant

## 2017-07-01 ENCOUNTER — Ambulatory Visit (INDEPENDENT_AMBULATORY_CARE_PROVIDER_SITE_OTHER): Payer: BLUE CROSS/BLUE SHIELD | Admitting: Physician Assistant

## 2017-07-01 VITALS — BP 108/70 | HR 70 | Temp 98.1°F | Ht 64.0 in | Wt 339.0 lb

## 2017-07-01 DIAGNOSIS — I10 Essential (primary) hypertension: Secondary | ICD-10-CM | POA: Diagnosis not present

## 2017-07-01 DIAGNOSIS — R632 Polyphagia: Secondary | ICD-10-CM

## 2017-07-01 DIAGNOSIS — Z6841 Body Mass Index (BMI) 40.0 and over, adult: Secondary | ICD-10-CM

## 2017-07-01 DIAGNOSIS — F3181 Bipolar II disorder: Secondary | ICD-10-CM | POA: Diagnosis not present

## 2017-07-02 NOTE — Progress Notes (Signed)
Office: [phone]  /  Fax: [phone]   HPI:   Chief Complaint: OBESITY Alicia Lucero is here to discuss her progress with her obesity treatment plan. She is on the keep a food journal with 1800 calories and 100 grams of protein daily and is following her eating plan approximately 10 % of the time. She states she is exercising 0 minutes 0 times per week. Alicia Lucero continues to do well with weight loss. States since last visit, she has been placed on Vyvanse for Binge eating disorder, and as a result, her appetite has been suppressed.  Overall, she is more mindful of her eating. Her weight is (!) 339 lb (153.8 kg) today and has had a weight loss of 13 pounds over a period of 2 weeks since her last visit. She has lost 1 lb since starting treatment with [place].  Hypertension Alicia Lucero is a 56 y.o. female with hypertension. Alicia Lucero denies chest pain or shortness of breath on exertion. She is working weight loss to help control her blood pressure with the goal of decreasing her risk of heart attack and stroke. Alicia Lucero blood pressure is currently controlled.  Binge Eating Disorder Alicia Lucero sees a psychiatrist for Binge eating and her medicine has recently been adjusted, with discontinuation of Vraylar and addition of Vyvanse. States change medicine has controlled her eating behaviors. States her mood is normal. Denies any suicial/homicidal ideation.  Bipolar Depression Patient current mood stable. Denies any current SI/HI, or any hallucinations. Alicia Lucero relates she has seen her Psychiatrist since last visit, and her Pschyatric medicine has been adjusted, and Vraylar has been discontinued. According to medicine review, Alicia Lucero is on medicine list and has not been discontinued. She has been started on Vyvanse for Binge eating.     ALLERGIES: Allergies  Allergen Reactions  . Empagliflozin Other (See Comments)    Causes yeast infection  . Empagliflozin-Linagliptin Other (See Comments)    Causes yeast  infection  . Other Nausea Only    States she can't tolerate seafood smell  . Oxycodone     Sleepy, nausea, diarrhea, vomiting    MEDICATIONS: Current Outpatient Prescriptions on File Prior to Visit  Medication Sig Dispense Refill  . ACCU-CHEK AVIVA PLUS test strip USE AS DIRECTED 100 each 0  . ACCU-CHEK SOFTCLIX LANCETS lancets USE AS DIRECTED 100 each 1  . B Complex-C (B-COMPLEX WITH VITAMIN C) tablet Take 1 tablet by mouth daily.    . B-D ULTRAFINE III SHORT PEN 31G X 8 MM MISC use as directed 100 each 3  . Cariprazine HCl (VRAYLAR) 1.5 MG CAPS Take 1 capsule by mouth every other day.    . Cholecalciferol (VITAMIN D) 2000 units tablet Take 1 tablet (2,000 Units total) by mouth daily. 30 tablet 0  . clonazePAM (KLONOPIN) 1 MG tablet Take 1 tablet (1 mg total) by mouth 2 (two) times daily as needed. 60 tablet 2  . gabapentin (NEURONTIN) 300 MG capsule TAKE 2 CAPSULES BY MOUTH THREE TIMES A DAY (Patient taking differently: TAKE 4 CAPSULES BY MOUTH THREE TIMES A DAY) 540 capsule 1  . GLUCOSA-CHONDR-NA CHONDR-MSM PO Take by mouth.    . Insulin Glargine (TOUJEO SOLOSTAR) 300 UNIT/ML SOPN Inject 50 Units into the skin daily. 9 pen 3  . ipratropium-albuterol (DUONEB) 0.5-2.5 (3) MG/3ML SOLN Take 3 mLs by nebulization every 6 (six) hours as needed. 360 mL 3  . ketoconazole (NIZORAL) 2 % cream Apply 1 application topically 2 (two) times daily. 60 g 0  . lamoTRIgine (  LAMICTAL) 200 MG tablet Take 2 tablets (400 mg total) by mouth every evening. 180 tablet 1  . liraglutide (VICTOZA) 18 MG/3ML SOPN Inject 0.1 mLs (0.6 mg total) into the skin every morning. 1 pen 0  . lisinopril (PRINIVIL,ZESTRIL) 5 MG tablet Take 1 tablet (5 mg total) by mouth daily. 90 tablet 3  . meloxicam (MOBIC) 15 MG tablet Take 1 tablet (15 mg total) by mouth daily. 30 tablet 0  . metFORMIN (GLUCOPHAGE-XR) 500 MG 24 hr tablet take 2 tablets by mouth twice a day 120 tablet 5  . Multiple Vitamins-Minerals (WOMENS MULTIVITAMIN PLUS  PO) Take 1 tablet by mouth daily.     . mupirocin ointment (BACTROBAN) 2 % apply to affected area twice a day 22 g 0  . NYAMYC powder apply topically four times a day 60 g 0  . nystatin cream (MYCOSTATIN) Apply 1 application topically 2 (two) times daily. 30 g 0  . Omega-3 Fatty Acids (FISH OIL PO) Take 2 capsules by mouth 2 (two) times daily.    Marland Kitchen OVER THE COUNTER MEDICATION Tumeric supplement taking daily    . ranitidine (ZANTAC) 150 MG capsule Take 1 capsule (150 mg total) by mouth 2 (two) times daily. 180 capsule 3  . sertraline (ZOLOFT) 100 MG tablet Take 2 tablets (200 mg total) by mouth daily. 180 tablet 1  . simvastatin (ZOCOR) 20 MG tablet take 1 tablet by mouth at bedtime 90 tablet 3  . VENTOLIN HFA 108 (90 Base) MCG/ACT inhaler inhale 2 puffs by mouth every 4 hours if needed for cough wheezing or shortness of breath 18 g 1   No current facility-administered medications on file prior to visit.     PAST MEDICAL HISTORY: Past Medical History:  Diagnosis Date  . Allergy   . Asthma   . Back pain   . Bipolar 1 disorder (HCC)   . Chest pain   . Depression   . Diabetes mellitus without complication (HCC)   . Drug use   . Dyspnea   . Fatty liver   . Gallbladder problem   . GERD (gastroesophageal reflux disease)   . Hyperlipidemia   . Infertility, female   . Joint pain   . Neuropathy   . PCOS (polycystic ovarian syndrome)   . Perimenopausal     PAST SURGICAL HISTORY: Past Surgical History:  Procedure Laterality Date  . CHOLECYSTECTOMY  1996  . DILATION AND CURETTAGE OF UTERUS     secondary to menorrhagia  . KNEE ARTHROSCOPY Left 2008  . TONSILLECTOMY     as a child  . WISDOM TOOTH EXTRACTION      SOCIAL HISTORY: Social History  Substance Use Topics  . Smoking status: Former Smoker    Types: Cigarettes    Quit date: 06/30/2005  . Smokeless tobacco: Never Used     Comment: electronic cigarettes PRN  . Alcohol use No     Comment: quit: 2007    FAMILY  HISTORY: Family History  Problem Relation Age of Onset  . Cancer Mother 65       lung  . Mental illness Mother        bipolar mania  . Hypertension Mother   . Depression Mother   . Cancer Father        liver  . Hypertension Father   . Heart disease Father 52       CAD/CABG  . Multiple sclerosis Sister   . Hypertension Sister   . Diabetes Sister  ROS: Review of Systems  Constitutional: Positive for weight loss.  Respiratory: Negative for shortness of breath (on exertion).   Cardiovascular: Negative for chest pain.  Psychiatric/Behavioral: Negative for substance abuse and suicidal ideas.    PHYSICAL EXAM: Blood pressure 108/70, pulse 70, temperature 98.1 F (36.7 C), temperature source Oral, height 5\' 4"  (1.626 m), weight (!) 339 lb (153.8 kg), last menstrual period 06/20/2017, SpO2 95 %. Body mass index is 58.19 kg/m. Physical Exam  Constitutional: She is oriented to person, place, and time. She appears well-developed and well-nourished.  Cardiovascular: Normal rate.   Pulmonary/Chest: Effort normal.  Musculoskeletal: Normal range of motion.  Neurological: She is oriented to person, place, and time.  Skin: Skin is warm and dry.  Psychiatric: She has a normal mood and affect. Her behavior is normal.  Vitals reviewed.   RECENT LABS AND TESTS: BMET    Component Value Date/Time   NA 138 05/18/2017 1022   K 4.5 05/18/2017 1022   CL 103 05/18/2017 1022   CO2 21 05/18/2017 1022   GLUCOSE 168 (H) 05/18/2017 1022   GLUCOSE 178 (H) 06/25/2016 1207   BUN 15 05/18/2017 1022   CREATININE 0.83 05/18/2017 1022   CREATININE 0.74 06/25/2016 1207   CALCIUM 9.4 05/18/2017 1022   GFRNONAA 81 05/18/2017 1022   GFRNONAA >89 04/06/2014 1310   GFRAA 93 05/18/2017 1022   GFRAA >89 04/06/2014 1310   Lab Results  Component Value Date   HGBA1C 7.4 (H) 05/18/2017   HGBA1C 7.4 04/17/2017   HGBA1C 7.2 12/24/2016   HGBA1C 6.8 (H) 08/27/2016   HGBA1C 7.5 06/25/2016   Lab  Results  Component Value Date   INSULIN 6.9 05/18/2017   CBC    Component Value Date/Time   WBC 12.1 (H) 05/18/2017 1022   WBC 11.3 (H) 06/25/2016 1207   RBC 4.66 05/18/2017 1022   RBC 4.45 06/25/2016 1207   HGB 13.2 05/18/2017 1022   HCT 40.4 05/18/2017 1022   PLT CANCELED 04/17/2017 0925   MCV 87 05/18/2017 1022   MCH 28.3 05/18/2017 1022   MCH 28.3 06/25/2016 1207   MCHC 32.7 05/18/2017 1022   MCHC 32.6 06/25/2016 1207   RDW 14.6 05/18/2017 1022   LYMPHSABS 2.7 05/18/2017 1022   MONOABS 678 06/25/2016 1207   EOSABS 0.4 05/18/2017 1022   BASOSABS 0.0 05/18/2017 1022   Iron/TIBC/Ferritin/ %Sat No results found for: IRON, TIBC, FERRITIN, IRONPCTSAT Lipid Panel     Component Value Date/Time   CHOL 164 05/18/2017 1022   TRIG 106 05/18/2017 1022   HDL 67 05/18/2017 1022   CHOLHDL 2.6 04/17/2017 0925   CHOLHDL 3.4 06/25/2016 1207   VLDL 27 06/25/2016 1207   LDLCALC 76 05/18/2017 1022   Hepatic Function Panel     Component Value Date/Time   PROT 7.0 05/18/2017 1022   ALBUMIN 4.2 05/18/2017 1022   AST 12 05/18/2017 1022   ALT 15 05/18/2017 1022   ALKPHOS 92 05/18/2017 1022   BILITOT 0.4 05/18/2017 1022      Component Value Date/Time   TSH 2.700 05/18/2017 1022   TSH 1.92 02/11/2016 1004   TSH 1.924 12/21/2014 1539    ASSESSMENT AND PLAN: Essential hypertension  Binge eating  Bipolar 2 disorder (HCC)  Class 3 severe obesity with serious comorbidity and body mass index (BMI) of 50.0 to 59.9 in adult, unspecified obesity type (HCC)  PLAN:  Hypertension We discussed sodium restriction, working on healthy weight loss, and a regular exercise program as the means  to achieve improved blood pressure control. Tinia agreed with this plan and agreed to follow up as directed. We will continue to monitor her blood pressure as well as her progress with the above lifestyle modifications. She will continue her medications as prescribed and will watch for signs of  hypotension as she continues her lifestyle modifications.  Binge Eating Aalyah agrees to continue taking her medications as prescribed and she is to see her Psychiatrist as scheduled. She agrees to follow up with our clinic in 2 weeks.  Bipolar Depression  Ainslee's medication has been reviewed with her. She is made aware that Alicia Lucero is still on her current medication list and has not been stopped by her Psychiatrist. She is advised to bring in a record of her last visit with her Psychiatrist for review. Also she is advised to call her Psychiatrist to be clear on her medication. Darcia agrees to the plan and agrees to see [place] as scheduled in 2 weeks.   Obesity Kristina is currently in the action stage of change. As such, her goal is to continue with weight loss efforts She has agreed to keep a food journal with 1800 calories and 100 grams of protein daily Lilyrose has been instructed to work up to a goal of 150 minutes of combined cardio and strengthening exercise per week for weight loss and overall health benefits. We discussed the following Behavioral Modification Strategies today: increasing lean protein intake and work on meal planning and easy cooking plans  Rebbeca has agreed to follow up with our clinic in 2 weeks. She was informed of the importance of frequent follow up visits to maximize her success with intensive lifestyle modifications for her multiple health conditions.  I, [place] Crane, am acting as transcriptionist for Illa Level, PA-C  I have reviewed the above documentation for accuracy and completeness, and I agree with the above. -Illa Level, PA-C  I have reviewed the above note and agree with the plan. -Quillian Quince, MD   OBESITY BEHAVIORAL INTERVENTION VISIT  Today's visit was # 4 out of 22.  Starting weight: 340 lbs Starting date: 05/18/17 Today's weight : 339 lbs Today's date: 07/01/2017 Total lbs lost to date: 1 (Patients must lose 7 lbs in the first 6 months to continue  with counseling)   ASK: We discussed the diagnosis of obesity with Nicolasa Ducking today and Jolanta agreed to give [place] permission to discuss obesity behavioral modification therapy today.  ASSESS: Linette has the diagnosis of obesity and her BMI today is 58.16 Martia is in the action stage of change   ADVISE: Emile was educated on the multiple health risks of obesity as well as the benefit of weight loss to improve her health. She was advised of the need for long term treatment and the importance of lifestyle modifications.  AGREE: Multiple dietary modification options and treatment options were discussed and  Abimbola agreed to keep a food journal with 1800 calories and 100 grams of protein daily We discussed the following Behavioral Modification Strategies today: increasing lean protein intake and work on meal planning and easy cooking plans

## 2017-07-07 ENCOUNTER — Telehealth: Payer: Self-pay

## 2017-07-07 NOTE — Telephone Encounter (Signed)
I left a partial message on patient's machine advising her to cb to review overdue health maintenance, however, before I could finish the phone hung up on me.

## 2017-07-16 ENCOUNTER — Ambulatory Visit (INDEPENDENT_AMBULATORY_CARE_PROVIDER_SITE_OTHER): Payer: BLUE CROSS/BLUE SHIELD | Admitting: Physician Assistant

## 2017-07-16 VITALS — BP 106/67 | HR 58 | Temp 98.1°F | Ht 64.0 in | Wt 336.0 lb

## 2017-07-16 DIAGNOSIS — Z6841 Body Mass Index (BMI) 40.0 and over, adult: Secondary | ICD-10-CM

## 2017-07-16 DIAGNOSIS — R632 Polyphagia: Secondary | ICD-10-CM | POA: Diagnosis not present

## 2017-07-16 DIAGNOSIS — E119 Type 2 diabetes mellitus without complications: Secondary | ICD-10-CM | POA: Diagnosis not present

## 2017-07-16 NOTE — Telephone Encounter (Signed)
error 

## 2017-07-20 NOTE — Progress Notes (Signed)
Office: [phone]  /  Fax: [phone]   HPI:   Chief Complaint: OBESITY Hooria is here to discuss her progress with her obesity treatment plan. She is on the keep a food journal with 1800 calories and 100 grams of protein daily and is following her eating plan approximately 50 % of the time. She states she is exercising 0 minutes 0 times per week. Tasheika continues to do well with weight loss. She states she is concentrating on eating more protein. She states Vyvanse has decreased her appetite.  Her weight is (!) 336 lb (152.4 kg) today and has had a weight loss of 3 pounds over a period of 2 weeks since her last visit. She has lost 4 lbs since starting treatment with [place].  Binge Eating Skyylar sees a Therapist, sports and has been started on Vyvanse, which she states has helped and she has not had any binge eating episodes. She admits to feeling low energy during the afternoon and she attributes that to Vyvanse.  Diabetes Mellitus Shontay has a diagnosis of diabetes mellitus. Stepahnie is not checking her BGs and she denies any hypoglycemic episodes. Last A1c was 7.4 on 05/18/17. She has been working on intensive lifestyle modifications including diet, exercise, and weight loss to help control her blood glucose levels.  ALLERGIES: Allergies  Allergen Reactions  . Empagliflozin Other (See Comments)    Causes yeast infection  . Empagliflozin-Linagliptin Other (See Comments)    Causes yeast infection  . Other Nausea Only    States she can't tolerate seafood smell  . Oxycodone     Sleepy, nausea, diarrhea, vomiting    MEDICATIONS: Current Outpatient Medications on File Prior to Visit  Medication Sig Dispense Refill  . ACCU-CHEK AVIVA PLUS test strip USE AS DIRECTED 100 each 0  . ACCU-CHEK SOFTCLIX LANCETS lancets USE AS DIRECTED 100 each 1  . B Complex-C (B-COMPLEX WITH VITAMIN C) tablet Take 1 tablet by mouth daily.    . B-D ULTRAFINE III SHORT PEN 31G X 8 MM MISC use as directed 100 each 3  .  Cholecalciferol (VITAMIN D) 2000 units tablet Take 1 tablet (2,000 Units total) by mouth daily. 30 tablet 0  . clonazePAM (KLONOPIN) 1 MG tablet Take 1 tablet (1 mg total) by mouth 2 (two) times daily as needed. 60 tablet 2  . gabapentin (NEURONTIN) 300 MG capsule TAKE 2 CAPSULES BY MOUTH THREE TIMES A DAY (Patient taking differently: TAKE 4 CAPSULES BY MOUTH THREE TIMES A DAY) 540 capsule 1  . GLUCOSA-CHONDR-NA CHONDR-MSM PO Take by mouth.    . Insulin Glargine (TOUJEO SOLOSTAR) 300 UNIT/ML SOPN Inject 50 Units into the skin daily. 9 pen 3  . ipratropium-albuterol (DUONEB) 0.5-2.5 (3) MG/3ML SOLN Take 3 mLs by nebulization every 6 (six) hours as needed. 360 mL 3  . ketoconazole (NIZORAL) 2 % cream Apply 1 application topically 2 (two) times daily. 60 g 0  . lamoTRIgine (LAMICTAL) 200 MG tablet Take 2 tablets (400 mg total) by mouth every evening. 180 tablet 1  . liraglutide (VICTOZA) 18 MG/3ML SOPN Inject 0.1 mLs (0.6 mg total) into the skin every morning. 1 pen 0  . lisdexamfetamine (VYVANSE) 30 MG capsule Take 30 mg daily by mouth.    Marland Kitchen lisinopril (PRINIVIL,ZESTRIL) 5 MG tablet Take 1 tablet (5 mg total) by mouth daily. 90 tablet 3  . meloxicam (MOBIC) 15 MG tablet Take 1 tablet (15 mg total) by mouth daily. 30 tablet 0  . metFORMIN (GLUCOPHAGE-XR) 500 MG 24  hr tablet take 2 tablets by mouth twice a day 120 tablet 5  . Multiple Vitamins-Minerals (WOMENS MULTIVITAMIN PLUS PO) Take 1 tablet by mouth daily.     . mupirocin ointment (BACTROBAN) 2 % apply to affected area twice a day 22 g 0  . NYAMYC powder apply topically four times a day 60 g 0  . nystatin cream (MYCOSTATIN) Apply 1 application topically 2 (two) times daily. 30 g 0  . Omega-3 Fatty Acids (FISH OIL PO) Take 2 capsules by mouth 2 (two) times daily.    Marland Kitchen. OVER THE COUNTER MEDICATION Tumeric supplement taking daily    . ranitidine (ZANTAC) 150 MG capsule Take 1 capsule (150 mg total) by mouth 2 (two) times daily. 180 capsule 3  .  sertraline (ZOLOFT) 100 MG tablet Take 2 tablets (200 mg total) by mouth daily. 180 tablet 1  . simvastatin (ZOCOR) 20 MG tablet take 1 tablet by mouth at bedtime 90 tablet 3  . VENTOLIN HFA 108 (90 Base) MCG/ACT inhaler inhale 2 puffs by mouth every 4 hours if needed for cough wheezing or shortness of breath 18 g 1   No current facility-administered medications on file prior to visit.     PAST MEDICAL HISTORY: Past Medical History:  Diagnosis Date  . Allergy   . Asthma   . Back pain   . Bipolar 1 disorder (HCC)   . Chest pain   . Depression   . Diabetes mellitus without complication (HCC)   . Drug use   . Dyspnea   . Fatty liver   . Gallbladder problem   . GERD (gastroesophageal reflux disease)   . Hyperlipidemia   . Infertility, female   . Joint pain   . Neuropathy   . PCOS (polycystic ovarian syndrome)   . Perimenopausal     PAST SURGICAL HISTORY: Past Surgical History:  Procedure Laterality Date  . CHOLECYSTECTOMY  1996  . DILATION AND CURETTAGE OF UTERUS     secondary to menorrhagia  . KNEE ARTHROSCOPY Left 2008  . TONSILLECTOMY     as a child  . WISDOM TOOTH EXTRACTION      SOCIAL HISTORY: Social History   Tobacco Use  . Smoking status: Former Smoker    Types: Cigarettes    Last attempt to quit: 06/30/2005    Years since quitting: 12.0  . Smokeless tobacco: Never Used  . Tobacco comment: electronic cigarettes PRN  Substance Use Topics  . Alcohol use: No    Alcohol/week: 0.0 oz    Comment: quit: 2007  . Drug use: No    Comment: occasional    FAMILY HISTORY: Family History  Problem Relation Age of Onset  . Cancer Mother [id number]       lung  . Mental illness Mother        bipolar mania  . Hypertension Mother   . Depression Mother   . Cancer Father        liver  . Hypertension Father   . Heart disease Father [id number]       CAD/CABG  . Multiple sclerosis Sister   . Hypertension Sister   . Diabetes Sister     ROS: Review of Systems  Constitutional:  Positive for weight loss.  Endo/Heme/Allergies:       Negative hypoglycemia    PHYSICAL EXAM: Blood pressure 106/67, pulse (!) 58, temperature 98.1 F (36.7 C), temperature source Oral, height 5\' 4"  (1.626 m), weight (!) 336 lb (152.4 kg), last menstrual period 07/15/2017,  SpO2 96 %. Body mass index is 57.67 kg/m. Physical Exam  Constitutional: She is oriented to person, place, and time. She appears well-developed and well-nourished.  Cardiovascular:  Bradycardic  Pulmonary/Chest: Effort normal.  Musculoskeletal: Normal range of motion.  Neurological: She is oriented to person, place, and time.  Skin: Skin is warm and dry.  Psychiatric: She has a normal mood and affect. Her behavior is normal.  Vitals reviewed.   RECENT LABS AND TESTS: BMET    Component Value Date/Time   NA 138 05/18/2017 1022   K 4.5 05/18/2017 1022   CL 103 05/18/2017 1022   CO2 21 05/18/2017 1022   GLUCOSE 168 (H) 05/18/2017 1022   GLUCOSE 178 (H) 06/25/2016 1207   BUN 15 05/18/2017 1022   CREATININE 0.83 05/18/2017 1022   CREATININE 0.74 06/25/2016 1207   CALCIUM 9.4 05/18/2017 1022   GFRNONAA 81 05/18/2017 1022   GFRNONAA >89 04/06/2014 1310   GFRAA 93 05/18/2017 1022   GFRAA >89 04/06/2014 1310   Lab Results  Component Value Date   HGBA1C 7.4 (H) 05/18/2017   HGBA1C 7.4 04/17/2017   HGBA1C 7.2 12/24/2016   HGBA1C 6.8 (H) 08/27/2016   HGBA1C 7.5 06/25/2016   Lab Results  Component Value Date   INSULIN 6.9 05/18/2017   CBC    Component Value Date/Time   WBC 12.1 (H) 05/18/2017 1022   WBC 11.3 (H) 06/25/2016 1207   RBC 4.66 05/18/2017 1022   RBC 4.45 06/25/2016 1207   HGB 13.2 05/18/2017 1022   HCT 40.4 05/18/2017 1022   PLT CANCELED 04/17/2017 0925   MCV 87 05/18/2017 1022   MCH 28.3 05/18/2017 1022   MCH 28.3 06/25/2016 1207   MCHC 32.7 05/18/2017 1022   MCHC 32.6 06/25/2016 1207   RDW 14.6 05/18/2017 1022   LYMPHSABS 2.7 05/18/2017 1022   MONOABS 678 06/25/2016 1207    EOSABS 0.4 05/18/2017 1022   BASOSABS 0.0 05/18/2017 1022   Iron/TIBC/Ferritin/ %Sat No results found for: IRON, TIBC, FERRITIN, IRONPCTSAT Lipid Panel     Component Value Date/Time   CHOL 164 05/18/2017 1022   TRIG 106 05/18/2017 1022   HDL 67 05/18/2017 1022   CHOLHDL 2.6 04/17/2017 0925   CHOLHDL 3.4 06/25/2016 1207   VLDL 27 06/25/2016 1207   LDLCALC 76 05/18/2017 1022   Hepatic Function Panel     Component Value Date/Time   PROT 7.0 05/18/2017 1022   ALBUMIN 4.2 05/18/2017 1022   AST 12 05/18/2017 1022   ALT 15 05/18/2017 1022   ALKPHOS 92 05/18/2017 1022   BILITOT 0.4 05/18/2017 1022      Component Value Date/Time   TSH 2.700 05/18/2017 1022   TSH 1.92 02/11/2016 1004   TSH 1.924 12/21/2014 1539    ASSESSMENT AND PLAN: Type 2 diabetes mellitus without complication, without long-term current use of insulin (HCC)  Binge eating  Class 3 severe obesity with serious comorbidity and body mass index (BMI) of 50.0 to 59.9 in adult, unspecified obesity type (HCC)  PLAN:  Diabetes Mellitus Jamayia has been given extensive diabetes education by myself today including ideal fasting and post-prandial blood glucose readings, individual ideal Hgb A1c goals and hypoglycemia prevention. We discussed the importance of good blood sugar control to decrease the likelihood of diabetic complications such as nephropathy, neuropathy, limb loss, blindness, coronary artery disease, and death. We discussed the importance of intensive lifestyle modification including diet, exercise and weight loss as the first line treatment for diabetes. Letina agrees to continue  her diabetes medications as prescribed and she agrees to follow up with our clinic in 3 weeks.   Binge Eating Alicia Lucero agrees to continue her medications as prescribed and she agrees to follow up with our clinic in 3 weeks. We spent > than 50% of the 15 minute visit on the counseling as documented in the note.  Obesity Alicia Lucero is currently  in the action stage of change. As such, her goal is to continue with weight loss efforts She has agreed to keep a food journal with 1800 calories and 100 grams of protein daily Alicia Lucero has been instructed to work up to a goal of 150 minutes of combined cardio and strengthening exercise per week for weight loss and overall health benefits. We discussed the following Behavioral Modification Strategies today: increasing lean protein intake and no skipping meals   Alicia Lucero has agreed to follow up with our clinic in 3 weeks. She was informed of the importance of frequent follow up visits to maximize her success with intensive lifestyle modifications for her multiple health conditions.  I, Burt KnackSharon Martin, am acting as transcriptionist for Illa LevelSahar Peggie Hornak, PA-C  I have reviewed the above documentation for accuracy and completeness, and I agree with the above. -Illa LevelSahar Cardin Nitschke, PA-C  I have reviewed the above note and agree with the plan. -Quillian Quincearen Beasley, MD       Today's visit was # 5 out of 22.  Starting weight: 340 lbs Starting date: 05/18/17 Today's weight : 336 lbs Today's date: 07/16/2017 Total lbs lost to date: 4 (Patients must lose 7 lbs in the first 6 months to continue with counseling)   ASK: We discussed the diagnosis of obesity with Nicolasa Duckingina Papp today and Alicia Lucero agreed to give us permission to discuss obesity behavioral modification therapy today.  ASSESS: Alicia Lucero has the diagnosis of obesity and her BMI today is 57.65 Alicia Lucero is in the action stage of change   ADVISE: Alicia Lucero was educated on the multiple health risks of obesity as well as the benefit of weight loss to improve her health. She was advised of the need for long term treatment and the importance of lifestyle modifications.  AGREE: Multiple dietary modification options and treatment options were discussed and  Alicia Lucero agreed to keep a food journal with 1800 calories and 100 grams of protein daily We discussed the following Behavioral  Modification Strategies today: increasing lean protein intake and no skipping meals

## 2017-08-03 ENCOUNTER — Ambulatory Visit (INDEPENDENT_AMBULATORY_CARE_PROVIDER_SITE_OTHER): Payer: BLUE CROSS/BLUE SHIELD | Admitting: Physician Assistant

## 2017-08-17 ENCOUNTER — Other Ambulatory Visit: Payer: Self-pay | Admitting: Family Medicine

## 2017-08-17 DIAGNOSIS — Z794 Long term (current) use of insulin: Principal | ICD-10-CM

## 2017-08-17 DIAGNOSIS — E1140 Type 2 diabetes mellitus with diabetic neuropathy, unspecified: Secondary | ICD-10-CM

## 2017-08-18 ENCOUNTER — Ambulatory Visit (INDEPENDENT_AMBULATORY_CARE_PROVIDER_SITE_OTHER): Payer: BLUE CROSS/BLUE SHIELD | Admitting: Physician Assistant

## 2017-08-18 ENCOUNTER — Encounter (INDEPENDENT_AMBULATORY_CARE_PROVIDER_SITE_OTHER): Payer: Self-pay

## 2017-09-08 DIAGNOSIS — H269 Unspecified cataract: Secondary | ICD-10-CM

## 2017-09-08 HISTORY — DX: Unspecified cataract: H26.9

## 2017-09-14 ENCOUNTER — Other Ambulatory Visit: Payer: Self-pay | Admitting: Family Medicine

## 2017-09-14 ENCOUNTER — Ambulatory Visit (INDEPENDENT_AMBULATORY_CARE_PROVIDER_SITE_OTHER): Payer: BLUE CROSS/BLUE SHIELD | Admitting: Physician Assistant

## 2017-09-14 ENCOUNTER — Encounter (INDEPENDENT_AMBULATORY_CARE_PROVIDER_SITE_OTHER): Payer: Self-pay

## 2017-09-22 ENCOUNTER — Other Ambulatory Visit: Payer: Self-pay | Admitting: Family Medicine

## 2017-09-22 DIAGNOSIS — Z794 Long term (current) use of insulin: Secondary | ICD-10-CM

## 2017-09-22 DIAGNOSIS — R801 Persistent proteinuria, unspecified: Secondary | ICD-10-CM

## 2017-09-22 DIAGNOSIS — E1140 Type 2 diabetes mellitus with diabetic neuropathy, unspecified: Secondary | ICD-10-CM

## 2017-10-12 DIAGNOSIS — F5081 Binge eating disorder: Secondary | ICD-10-CM | POA: Insufficient documentation

## 2017-10-12 DIAGNOSIS — F50811 Binge eating disorder, moderate: Secondary | ICD-10-CM | POA: Insufficient documentation

## 2017-12-25 ENCOUNTER — Ambulatory Visit (INDEPENDENT_AMBULATORY_CARE_PROVIDER_SITE_OTHER): Payer: BLUE CROSS/BLUE SHIELD

## 2017-12-25 ENCOUNTER — Encounter: Payer: Self-pay | Admitting: Physician Assistant

## 2017-12-25 ENCOUNTER — Ambulatory Visit (INDEPENDENT_AMBULATORY_CARE_PROVIDER_SITE_OTHER): Payer: BLUE CROSS/BLUE SHIELD | Admitting: Physician Assistant

## 2017-12-25 ENCOUNTER — Other Ambulatory Visit: Payer: Self-pay

## 2017-12-25 VITALS — BP 110/80 | HR 88 | Temp 98.1°F | Resp 18 | Ht 64.0 in | Wt 347.2 lb

## 2017-12-25 DIAGNOSIS — R062 Wheezing: Secondary | ICD-10-CM | POA: Diagnosis not present

## 2017-12-25 MED ORDER — ALBUTEROL SULFATE HFA 108 (90 BASE) MCG/ACT IN AERS: INHALATION_SPRAY | RESPIRATORY_TRACT | 1 refills | Status: DC

## 2017-12-25 MED ORDER — IPRATROPIUM BROMIDE 0.02 % IN SOLN
0.5000 mg | Freq: Once | RESPIRATORY_TRACT | Status: AC
  Administered 2017-12-25: 0.5 mg via RESPIRATORY_TRACT

## 2017-12-25 MED ORDER — ALBUTEROL SULFATE (2.5 MG/3ML) 0.083% IN NEBU: 2.5000 mg | INHALATION_SOLUTION | Freq: Four times a day (QID) | RESPIRATORY_TRACT | 1 refills | Status: DC | PRN

## 2017-12-25 MED ORDER — ALBUTEROL SULFATE (2.5 MG/3ML) 0.083% IN NEBU
2.5000 mg | INHALATION_SOLUTION | Freq: Once | RESPIRATORY_TRACT | Status: AC
  Administered 2017-12-25: 2.5 mg via RESPIRATORY_TRACT

## 2017-12-25 NOTE — Patient Instructions (Signed)
     IF you received an x-ray today, you will receive an invoice from [facility]. Please contact [facility] at [phone] with questions or concerns regarding your invoice.   IF you received labwork today, you will receive an invoice from [facility]. Please contact [facility] at [phone] with questions or concerns regarding your invoice.   Our billing staff will not be able to assist you with questions regarding bills from these companies.  You will be contacted with the lab results as soon as they are available. The fastest way to get your results is to activate your My Chart account. Instructions are located on the last page of this paperwork. If you have not heard from us regarding the results in 2 weeks, please contact this office.     

## 2017-12-25 NOTE — Progress Notes (Signed)
12/25/2017 2:44 PM   DOB: [birth year] / MRN: [id number]  SUBJECTIVE:  Alicia Duckingina Lufkin is a 57 y.o. female presenting for cough and wheezing for the last 3-4 days.  Getting worse.  No chest pain or SOB.  No leg swelling.  Hx of DM2 with modest control.  Has tried rescue inhaler with mild relief.   She is allergic to empagliflozin; empagliflozin-linagliptin; other; and oxycodone.   She  has a past medical history of Allergy, Asthma, Back pain, Bipolar 1 disorder (HCC), Chest pain, Depression, Diabetes mellitus without complication (HCC), Drug use, Dyspnea, Fatty liver, Gallbladder problem, GERD (gastroesophageal reflux disease), Hyperlipidemia, Infertility, female, Joint pain, Neuropathy, PCOS (polycystic ovarian syndrome), and Perimenopausal.    She  reports that she quit smoking about 12 years ago. Her smoking use included cigarettes. She has never used smokeless tobacco. She reports that she does not drink alcohol or use drugs. She  reports that she currently engages in sexual activity. She reports using the following method of birth control/protection: None. The patient  has a past surgical history that includes Cholecystectomy (1996); Tonsillectomy; Knee arthroscopy (Left, 2008); Dilation and curettage of uterus; and Wisdom tooth extraction.  Her family history includes Cancer in her father; Cancer (age of onset: 3563) in her mother; Depression in her mother; Diabetes in her sister; Heart disease (age of onset: 10062) in her father; Hypertension in her father, mother, and sister; Mental illness in her mother; Multiple sclerosis in her sister.  Review of Systems  Constitutional: Negative for chills, diaphoresis and fever.  Respiratory: Positive for cough, shortness of breath and wheezing. Negative for hemoptysis and sputum production.   Cardiovascular: Negative for chest pain.  Gastrointestinal: Negative for nausea.  Skin: Negative for rash.  Neurological: Negative for dizziness.    The problem  list and medications were reviewed and updated by myself where necessary and exist elsewhere in the encounter.   OBJECTIVE:  BP 110/80 (BP Location: Right Arm, Patient Position: Sitting, Cuff Size: Large)   Pulse 88   Temp 98.1 F (36.7 C) (Oral)   Resp 18   Ht 5\' 4"  (1.626 m)   Wt (!) 347 lb 3.2 oz (157.5 kg)   SpO2 95%   BMI 59.60 kg/m   Physical Exam  Constitutional: She is oriented to person, place, and time. She appears well-nourished. No distress.  Eyes: Pupils are equal, round, and reactive to light. EOM are normal.  Cardiovascular: Normal rate, regular rhythm, S1 normal, S2 normal, normal heart sounds and intact distal pulses. Exam reveals no gallop, no friction rub and no decreased pulses.  No murmur heard. Pulmonary/Chest: Effort normal. No stridor. No respiratory distress. She has wheezes (Left lower fields). She has no rales. She exhibits no tenderness.  Abdominal: She exhibits no distension.  Musculoskeletal: She exhibits no edema.  Neurological: She is alert and oriented to person, place, and time. No cranial nerve deficit. Gait normal.  Skin: Skin is dry. She is not diaphoretic. No pallor.  Psychiatric: She has a normal mood and affect.  Vitals reviewed.   Lab Results  Component Value Date   HGBA1C 7.4 (H) 05/18/2017     No results found for this or any previous visit (from the past 72 hour(s)).  Dg Chest 2 View  Result Date: 12/25/2017 CLINICAL DATA:  Wheezing left lower lobe EXAM: CHEST - 2 VIEW COMPARISON:  07/19/2015 FINDINGS: The heart size and mediastinal contours are within normal limits. Both lungs are clear. The visualized skeletal structures are unremarkable.  IMPRESSION: No active cardiopulmonary disease. Electronically Signed   By: Marlan Palau M.D.   On: 12/25/2017 14:04    ASSESSMENT AND PLAN:  Alicia Lucero was seen today for cough.  Diagnoses and all orders for this visit:  Wheezing lung sounds improved dramatically status post 1 DuoNeb.  Chest  x-ray clear.  Will refill her nebulizer machine along with her albuterol nebs.  I have also refilled her inhaler.  Not seeing any indication for an antibiotic at this time. -     DG Chest 2 View; Future -     albuterol (PROVENTIL) (2.5 MG/3ML) 0.083% nebulizer solution 2.5 mg -     ipratropium (ATROVENT) nebulizer solution 0.5 mg    The patient is advised to call or return to clinic if she does not see an improvement in symptoms, or to seek the care of the closest emergency department if she worsens with the above plan.   Deliah Boston, MHS, PA-C Primary Care at [facility] 12/25/2017 2:44 PM

## 2018-01-18 ENCOUNTER — Other Ambulatory Visit: Payer: Self-pay | Admitting: Physician Assistant

## 2018-01-18 DIAGNOSIS — R062 Wheezing: Secondary | ICD-10-CM

## 2018-02-01 ENCOUNTER — Encounter: Payer: Self-pay | Admitting: Family Medicine

## 2018-02-18 ENCOUNTER — Other Ambulatory Visit: Payer: Self-pay | Admitting: Family Medicine

## 2018-02-18 DIAGNOSIS — K219 Gastro-esophageal reflux disease without esophagitis: Secondary | ICD-10-CM

## 2018-02-21 ENCOUNTER — Other Ambulatory Visit: Payer: Self-pay | Admitting: Family Medicine

## 2018-02-21 DIAGNOSIS — Z794 Long term (current) use of insulin: Principal | ICD-10-CM

## 2018-02-21 DIAGNOSIS — E11319 Type 2 diabetes mellitus with unspecified diabetic retinopathy without macular edema: Secondary | ICD-10-CM

## 2018-02-23 NOTE — Telephone Encounter (Signed)
Interface request for Flonase 50mcg Victoza   PCP:  Nilda SimmerKristi, [place]  LOV:   04/17/17 with Nilda SimmerKristi [place]  Pharmacy [facility]

## 2018-03-23 ENCOUNTER — Other Ambulatory Visit: Payer: Self-pay | Admitting: Family Medicine

## 2018-03-23 NOTE — Telephone Encounter (Signed)
Copied from CRM [id number]. Topic: Quick Communication - See Telephone Encounter >> Mar 23, 2018  1:40 PM Terisa Starraylor, Brittany L wrote: CRM for notification. See Telephone encounter for: 03/23/18.  Pharmacy called for refill on ranitidine (ZANTAC) 150 MG capsule.  [id number] [place] - [phone] [facility] [address]

## 2018-03-23 NOTE — Telephone Encounter (Signed)
Pt will need to call PCP listed in chart regarding medication. Pt no longer under prescriber's care, noted in refill request for 02/21/18.

## 2018-03-25 ENCOUNTER — Other Ambulatory Visit: Payer: Self-pay | Admitting: Family Medicine

## 2018-03-25 NOTE — Telephone Encounter (Signed)
Metformin refill Last Refill:09/14/17 # 120 5 RF Last OV: 04/17/17 PCP: Dr Katrinka BlazingSmith Pharmacy:[facility] [address] Last Hgb A1C 05/18/17 7.4  Called and left message for pt to make OV Refilled for 30 days

## 2018-03-30 ENCOUNTER — Ambulatory Visit: Payer: BLUE CROSS/BLUE SHIELD | Admitting: Obstetrics & Gynecology

## 2018-04-14 ENCOUNTER — Ambulatory Visit: Payer: BLUE CROSS/BLUE SHIELD | Admitting: Obstetrics & Gynecology

## 2018-04-14 ENCOUNTER — Encounter: Payer: Self-pay | Admitting: Obstetrics & Gynecology

## 2018-04-14 VITALS — BP 132/84 | Ht 63.0 in | Wt 336.0 lb

## 2018-04-14 DIAGNOSIS — L680 Hirsutism: Secondary | ICD-10-CM

## 2018-04-14 DIAGNOSIS — Z1151 Encounter for screening for human papillomavirus (HPV): Secondary | ICD-10-CM | POA: Diagnosis not present

## 2018-04-14 DIAGNOSIS — E66813 Obesity, class 3: Secondary | ICD-10-CM

## 2018-04-14 DIAGNOSIS — Z01419 Encounter for gynecological examination (general) (routine) without abnormal findings: Secondary | ICD-10-CM

## 2018-04-14 DIAGNOSIS — R300 Dysuria: Secondary | ICD-10-CM | POA: Diagnosis not present

## 2018-04-14 DIAGNOSIS — B372 Candidiasis of skin and nail: Secondary | ICD-10-CM

## 2018-04-14 DIAGNOSIS — N951 Menopausal and female climacteric states: Secondary | ICD-10-CM | POA: Diagnosis not present

## 2018-04-14 DIAGNOSIS — Z6841 Body Mass Index (BMI) 40.0 and over, adult: Secondary | ICD-10-CM

## 2018-04-14 MED ORDER — TERCONAZOLE 0.8 % VA CREA: 1.0000 | TOPICAL_CREAM | Freq: Every day | VAGINAL | 2 refills | Status: AC

## 2018-04-14 MED ORDER — NITROFURANTOIN MONOHYD MACRO 100 MG PO CAPS: 100.0000 mg | ORAL_CAPSULE | Freq: Two times a day (BID) | ORAL | 0 refills | Status: AC

## 2018-04-14 NOTE — Progress Notes (Signed)
Alicia Lucero Oct 13, 1960 [id number]   History:    57 y.o. G1P0A1 Single  RP:  New patient presenting for annual gyn exam   HPI: Menses every 2-4 months. Increased PMS/Irritability prior to periods.  Hot flushes and night sweats worsening when spacing periods.  Vaginal dryness per PCP on gyn exam.  Abstinent x >1 year.  No pelvic pain currently.  Urine/BMs wnl.  Breasts wnl.  BMI 59.52.  Counseling with Hazel Sams (AGNP-BC) for Bipolar disorder type II, GAD and Mood disorder.  On Zoloft, Trintellix, Lamictal, Vyvanse.  Patient had a traumatic experience with an abortion at age 41, anxious about the gynecologic exam.  Past medical history,surgical history, family history and social history were all reviewed and documented in the EPIC chart.  Gynecologic History Patient's last menstrual period was 03/28/2018. Contraception: abstinence Last Pap: 1.5 yr ago. Results were: normal per patient Last mammogram: 1.5 yr ago. Results were: normal per patient Bone Density: Never Colonoscopy: Not done yet, will organize through Fam MD.  Obstetric History OB History  Gravida Para Term Preterm AB Living  1       1 0  SAB TAB Ectopic Multiple Live Births    1          # Outcome Date GA Lbr Len/2nd Weight Sex Delivery Anes PTL Lv  1 TAB              ROS: A ROS was performed and pertinent positives and negatives are included in the history.  GENERAL: No fevers or chills. HEENT: No change in vision, no earache, sore throat or sinus congestion. NECK: No pain or stiffness. CARDIOVASCULAR: No chest pain or pressure. No palpitations. PULMONARY: No shortness of breath, cough or wheeze. GASTROINTESTINAL: No abdominal pain, nausea, vomiting or diarrhea, melena or bright red blood per rectum. GENITOURINARY: No urinary frequency, urgency, hesitancy or dysuria. MUSCULOSKELETAL: No joint or muscle pain, no back pain, no recent trauma. DERMATOLOGIC: No rash, no itching, no lesions. ENDOCRINE: No polyuria,  polydipsia, no heat or cold intolerance. No recent change in weight. HEMATOLOGICAL: No anemia or easy bruising or bleeding. NEUROLOGIC: No headache, seizures, numbness, tingling or weakness. PSYCHIATRIC: No depression, no loss of interest in normal activity or change in sleep pattern.     Exam:   Ht 5\' 3"  (1.6 m)   Wt (!) 336 lb (152.4 kg)   LMP 03/28/2018   BMI 59.52 kg/m   Body mass index is 59.52 kg/m.  General appearance : Well developed well nourished female. No acute distress HEENT: Eyes: no retinal hemorrhage or exudates,  Neck supple, trachea midline, no carotid bruits, no thyroidmegaly Lungs: Clear to auscultation, no rhonchi or wheezes, or rib retractions  Heart: Regular rate and rhythm, no murmurs or gallops Breast:Examined in sitting and supine position were symmetrical in appearance, no palpable masses or tenderness,  no skin retraction, no nipple inversion, no nipple discharge, no skin discoloration, no axillary or supraclavicular lymphadenopathy Abdomen: no palpable masses or tenderness, no rebound or guarding Extremities: no edema or skin discoloration or tenderness  Pelvic: Vulva: Normal             Vagina: No gross lesions or discharge  Cervix: No gross lesions or discharge.  Pap/HPV HR done.  Uterus  AV, normal size, shape and consistency, non-tender and mobile  Adnexa  Without masses or tenderness  Anus: Normal  U/A:  Yellow cloudy, Nitrites negative, WBC 20-40, RBC 0-2, many bacteria.  Urine Culture pending.   Assessment/Plan:  58 y.o. female for annual exam   1. Encounter for routine gynecological examination with Papanicolaou smear of cervix Normal gynecologic exam.  Pap with high-risk HPV done today.  Breast exam normal.  Schedule screening mammogram.  Will schedule colonoscopy through family MD.  Health labs with family MD.  2. Perimenopause Oligomenorrhea with vasomotor menopausal symptoms.  Probably perimenopausal.  Will rule out thyroid dysfunction.   FSH drawn today.  If menopause is confirmed, patient will follow-up to discuss hormone replacement therapy. - TSH - FSH  3. Class 3 severe obesity due to excess calories with serious comorbidity and body mass index (BMI) of 50.0 to 59.9 in adult [facility]) Recommend low calorie/low carb diet such as [employer] for weight loss.  Patient has diabetes mellitus type 2, recommend monitoring more closely.  Regular physical activity with aerobic activities 5 times a week and weightlifting every 2 days.  4. Hirsutism Mild hirsutism probably familial and PCOS components.  Will verify total testosterone and free testosterone. - Testos,Total,Free and SHBG (Female)  5. Dysuria Possible cystitis per urine analysis.  Will treat with Macrobid pending urine culture.  Recommend increasing water intake. - Urinalysis,Complete w/RFL Culture  6. Yeast infection of the skin Yeast infection of the skin at the inguinal areas.  Not improved with nystatin powder or cream.  Terconazole cream prescribed for local application.  7. Special screening examination for human papillomavirus (HPV) - PAP,TP IMGw/HPV RNA,rflx HPVTYPE16,18/45  Other orders - nitrofurantoin, macrocrystal-monohydrate, (MACROBID) 100 MG capsule; Take 1 capsule (100 mg total) by mouth 2 (two) times daily for 7 days. - terconazole (TERAZOL 3) 0.8 % vaginal cream; Place 1 applicator vaginally at bedtime for 3 days. Apply on skin at inguinal areas  Counseling on above issues and coordination of care more than 50% for 20 minutes.  Genia Del MD, 4:17 PM 04/14/2018

## 2018-04-15 LAB — PAP, TP IMAGING W/ HPV RNA, RFLX HPV TYPE 16,18/45: HPV DNA HIGH RISK: NOT DETECTED

## 2018-04-16 ENCOUNTER — Other Ambulatory Visit: Payer: Self-pay | Admitting: Family Medicine

## 2018-04-16 ENCOUNTER — Encounter: Payer: Self-pay | Admitting: Obstetrics & Gynecology

## 2018-04-16 ENCOUNTER — Other Ambulatory Visit: Payer: Self-pay | Admitting: Obstetrics & Gynecology

## 2018-04-16 DIAGNOSIS — R102 Pelvic and perineal pain: Secondary | ICD-10-CM

## 2018-04-16 DIAGNOSIS — Z794 Long term (current) use of insulin: Principal | ICD-10-CM

## 2018-04-16 DIAGNOSIS — E1140 Type 2 diabetes mellitus with diabetic neuropathy, unspecified: Secondary | ICD-10-CM

## 2018-04-16 LAB — URINALYSIS, COMPLETE W/RFL CULTURE
BILIRUBIN URINE: NEGATIVE
GLUCOSE, UA: NEGATIVE
HGB URINE DIPSTICK: NEGATIVE
Hyaline Cast: NONE SEEN /LPF
Ketones, ur: NEGATIVE
NITRITES URINE, INITIAL: NEGATIVE
PH: 5 (ref 5.0–8.0)
Protein, ur: NEGATIVE
Specific Gravity, Urine: 1.025 (ref 1.001–1.03)

## 2018-04-16 LAB — URINE CULTURE
MICRO NUMBER: 90939740
SPECIMEN QUALITY:: ADEQUATE

## 2018-04-16 LAB — CULTURE INDICATED

## 2018-04-16 MED ORDER — PROMETHAZINE HCL 12.5 MG RE SUPP: 12.5000 mg | Freq: Three times a day (TID) | RECTAL | 1 refills | Status: DC | PRN

## 2018-04-16 NOTE — Patient Instructions (Signed)
1. Encounter for routine gynecological examination with Papanicolaou smear of cervix Normal gynecologic exam.  Pap with high-risk HPV done today.  Breast exam normal.  Schedule screening mammogram.  Will schedule colonoscopy through family MD.  Health labs with family MD.  2. Perimenopause Oligomenorrhea with vasomotor menopausal symptoms.  Probably perimenopausal.  Will rule out thyroid dysfunction.  FSH drawn today.  If menopause is confirmed, patient will follow-up to discuss hormone replacement therapy. - TSH - FSH  3. Class 3 severe obesity due to excess calories with serious comorbidity and body mass index (BMI) of 50.0 to 59.9 in adult [facility](HCC) Recommend low calorie/low carb diet such as [employer] diet for weight loss.  Patient has diabetes mellitus type 2, recommend monitoring more closely.  Regular physical activity with aerobic activities 5 times a week and weightlifting every 2 days.  4. Hirsutism Mild hirsutism probably familial and PCOS components.  Will verify total testosterone and free testosterone. - Testos,Total,Free and SHBG (Female)  5. Dysuria Possible cystitis per urine analysis.  Will treat with Macrobid pending urine culture.  Recommend increasing water intake. - Urinalysis,Complete w/RFL Culture  6. Yeast infection of the skin Yeast infection of the skin at the inguinal areas.  Not improved with nystatin powder or cream.  Terconazole cream prescribed for local application.  7. Special screening examination for human papillomavirus (HPV) - PAP,TP IMGw/HPV RNA,rflx HPVTYPE16,18/45  Other orders - nitrofurantoin, macrocrystal-monohydrate, (MACROBID) 100 MG capsule; Take 1 capsule (100 mg total) by mouth 2 (two) times daily for 7 days. - terconazole (TERAZOL 3) 0.8 % vaginal cream; Place 1 applicator vaginally at bedtime for 3 days. Apply on skin at inguinal areas  Alicia Lucero, it was a pleasure meeting you today!  I will inform you of your results as soon as they are  available.

## 2018-04-18 IMAGING — CR DG SHOULDER 2+V*R*
3 series · 3 of 3 positions shown · non-contrast
Comparison: Chest x-ray of July 19, 2015

CLINICAL DATA: For 6 weeks of right shoulder pain without recent
injury. The patient had similar symptoms after a fall striking the
right arm in May 2015

EXAM:
RIGHT SHOULDER - 2+ VIEW

[AP (1 of 3)]
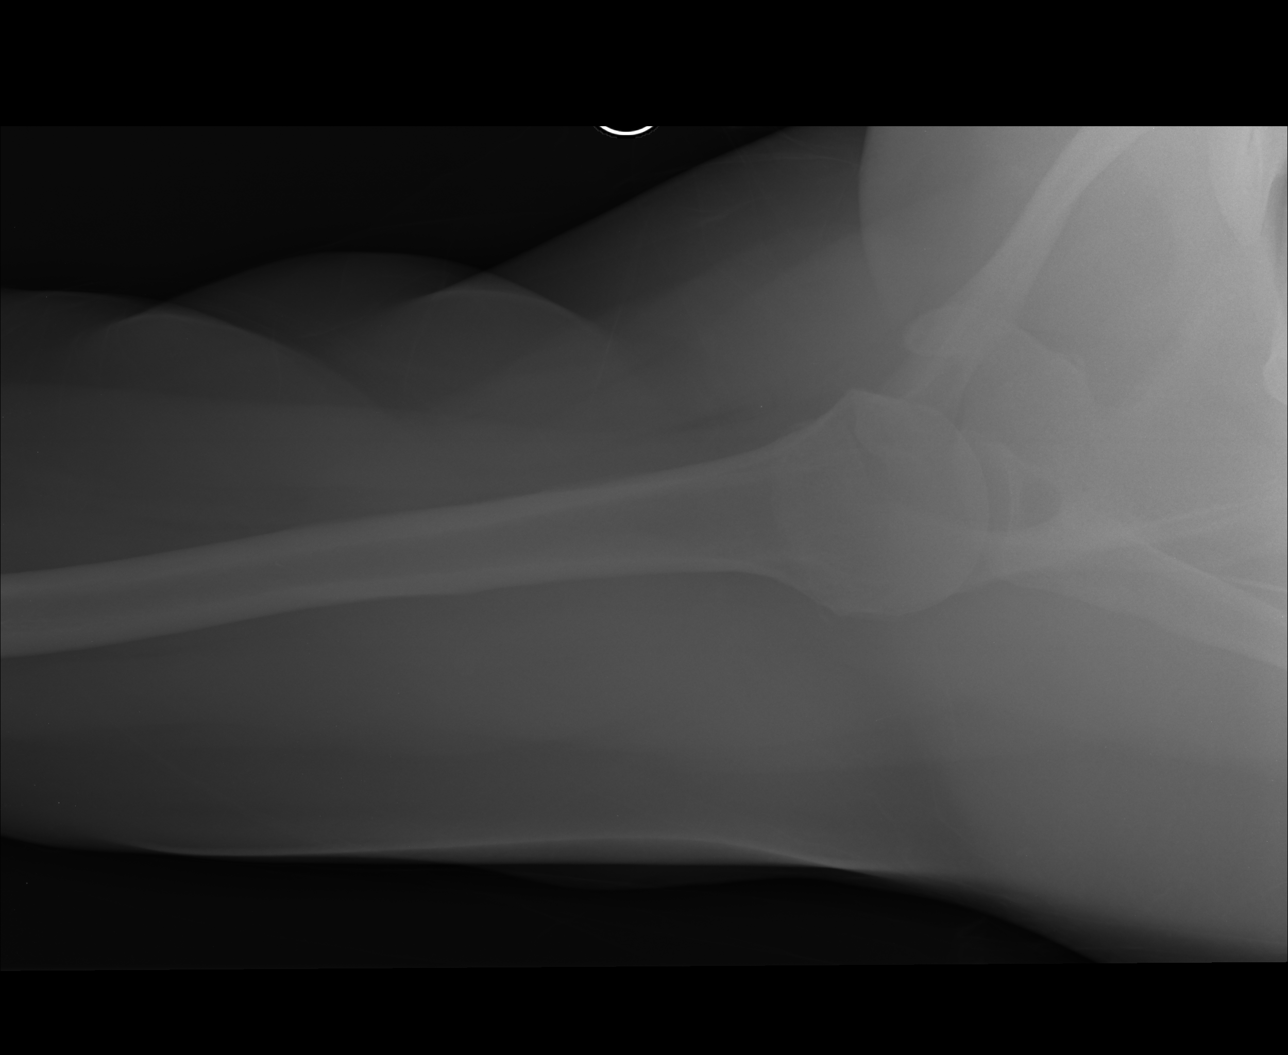

[AP (2 of 3)]
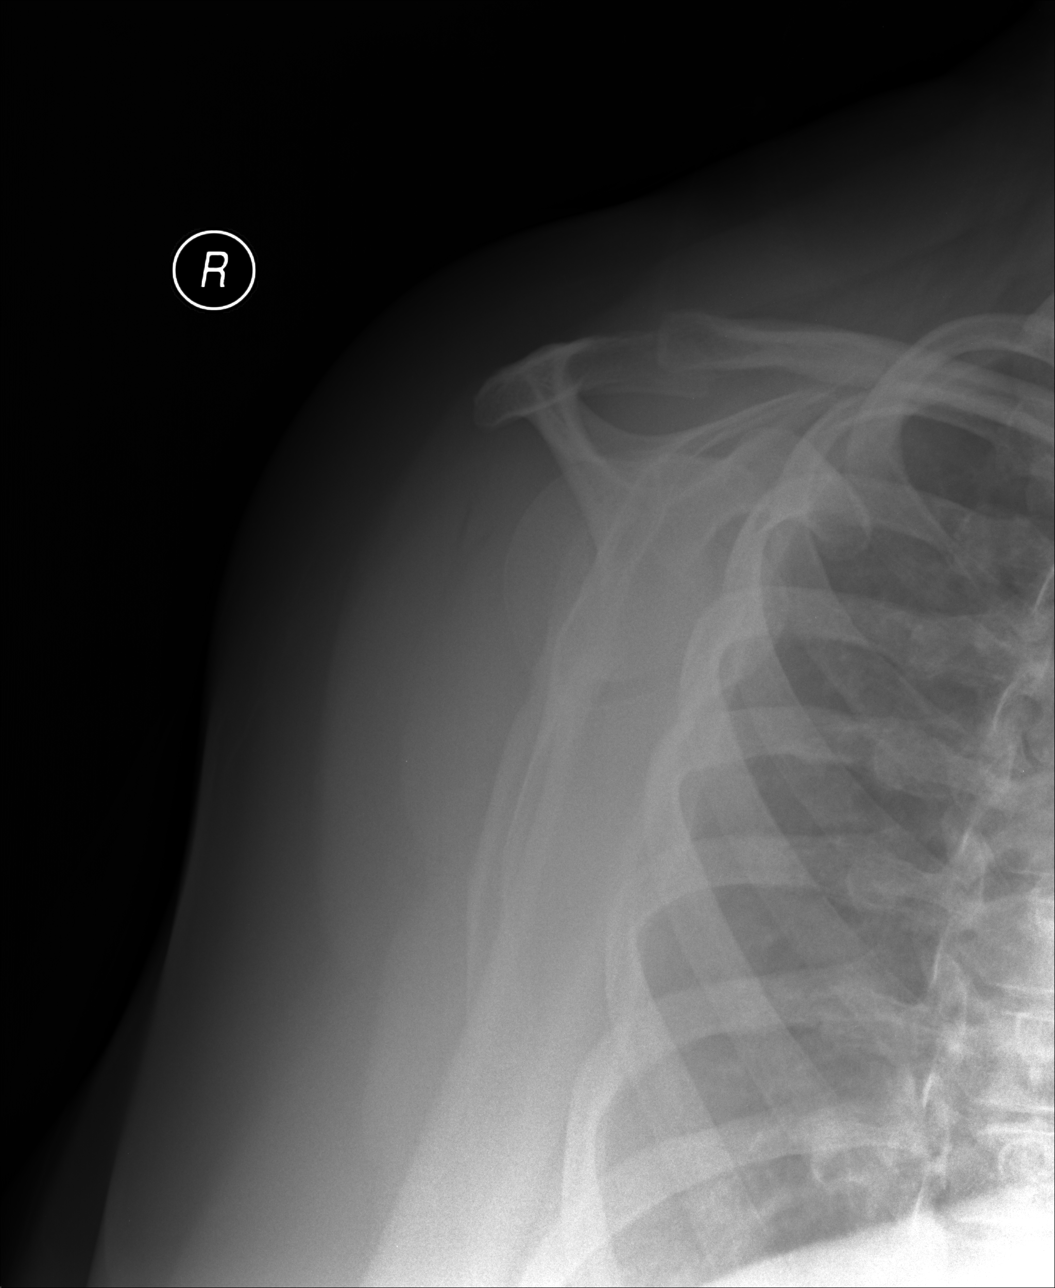

[AP (3 of 3)]
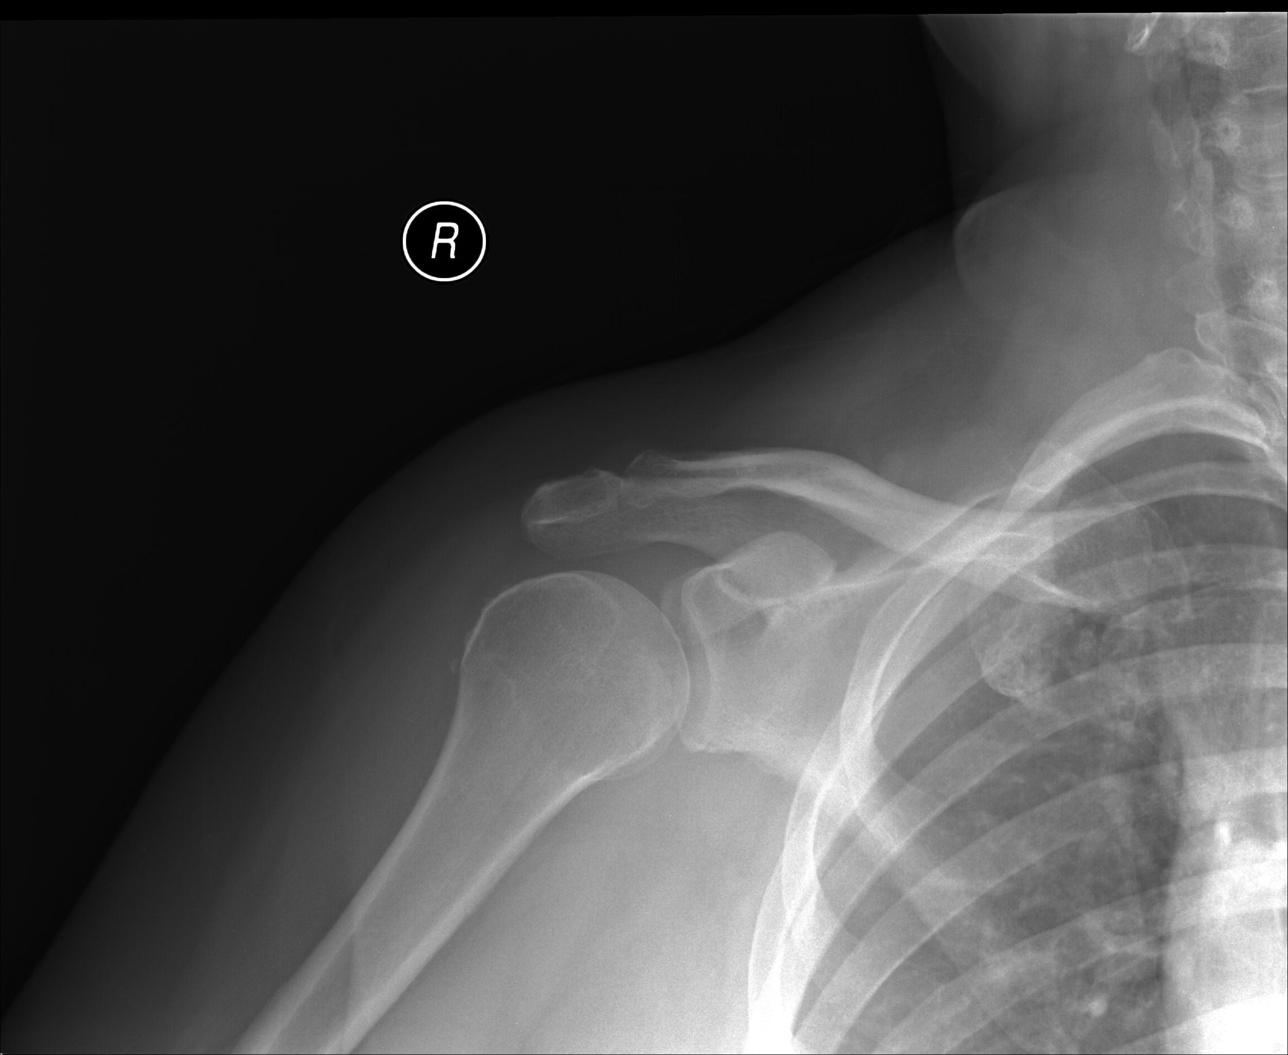

[3 of 3 positions shown; findings below may reference images not displayed]

FINDINGS: The bones of the right shoulder are adequately mineralized. The
glenohumeral and AC joints appear normal. The subacromial subdeltoid
space is normal. No acute fracture or dislocation is observed. The
observed portions of the right clavicle and upper right ribs are
normal.
IMPRESSION: There is no acute or significant chronic bony abnormality of the
right shoulder.

## 2018-04-19 NOTE — Telephone Encounter (Signed)
My Chart email has been sent informing her that urine culture is negative.

## 2018-04-20 LAB — TESTOS,TOTAL,FREE AND SHBG (FEMALE)
FREE TESTOSTERONE: 1.2 pg/mL (ref 0.1–6.4)
Sex Hormone Binding: 56 nmol/L (ref 17–124)
TESTOSTERONE, TOTAL, LC-MS-MS: 14 ng/dL (ref 2–45)

## 2018-04-20 LAB — TSH: TSH: 1.37 m[IU]/L

## 2018-04-20 LAB — FOLLICLE STIMULATING HORMONE: FSH: 33.1 m[IU]/mL

## 2018-04-22 ENCOUNTER — Other Ambulatory Visit: Payer: Self-pay | Admitting: Family Medicine

## 2018-04-22 NOTE — Telephone Encounter (Signed)
Patient called and she says she has [facility]. I advised her to call the pharmacy to ask them to send her refill request to her new provider, she verbalized understanding.

## 2018-05-03 ENCOUNTER — Other Ambulatory Visit: Payer: Self-pay

## 2018-05-03 ENCOUNTER — Emergency Department (HOSPITAL_COMMUNITY)
Admission: EM | Admit: 2018-05-03 | Discharge: 2018-05-03 | Disposition: A | Discharge status: Home / Self Care | Payer: BLUE CROSS/BLUE SHIELD | Attending: Emergency Medicine | Admitting: Emergency Medicine

## 2018-05-03 ENCOUNTER — Encounter (HOSPITAL_COMMUNITY): Payer: Self-pay

## 2018-05-03 DIAGNOSIS — Z87891 Personal history of nicotine dependence: Secondary | ICD-10-CM | POA: Insufficient documentation

## 2018-05-03 DIAGNOSIS — E1140 Type 2 diabetes mellitus with diabetic neuropathy, unspecified: Secondary | ICD-10-CM | POA: Diagnosis not present

## 2018-05-03 DIAGNOSIS — Z79899 Other long term (current) drug therapy: Secondary | ICD-10-CM | POA: Diagnosis not present

## 2018-05-03 DIAGNOSIS — E785 Hyperlipidemia, unspecified: Secondary | ICD-10-CM | POA: Insufficient documentation

## 2018-05-03 DIAGNOSIS — R1030 Lower abdominal pain, unspecified: Secondary | ICD-10-CM

## 2018-05-03 DIAGNOSIS — Z794 Long term (current) use of insulin: Secondary | ICD-10-CM | POA: Diagnosis not present

## 2018-05-03 DIAGNOSIS — I10 Essential (primary) hypertension: Secondary | ICD-10-CM | POA: Diagnosis not present

## 2018-05-03 LAB — COMPREHENSIVE METABOLIC PANEL
ALT: 17 U/L (ref 0–44)
ANION GAP: 8 (ref 5–15)
AST: 16 U/L (ref 15–41)
Albumin: 3.7 g/dL (ref 3.5–5.0)
Alkaline Phosphatase: 74 U/L (ref 38–126)
BUN: 12 mg/dL (ref 6–20)
CHLORIDE: 107 mmol/L (ref 98–111)
CO2: 28 mmol/L (ref 22–32)
Calcium: 9.5 mg/dL (ref 8.9–10.3)
Creatinine, Ser: 0.77 mg/dL (ref 0.44–1.00)
GFR calc Af Amer: >60 mL/min (ref >60)
Glucose, Bld: 116 mg/dL — ABNORMAL HIGH (ref 70–99)
Potassium: 4.1 mmol/L (ref 3.5–5.1)
SODIUM: 143 mmol/L (ref 135–145)
Total Bilirubin: 0.5 mg/dL (ref 0.3–1.2)
Total Protein: 7.2 g/dL (ref 6.5–8.1)

## 2018-05-03 LAB — CBC WITH DIFFERENTIAL/PLATELET
Basophils Absolute: 0 10*3/uL (ref 0.0–0.1)
Basophils Relative: 0 %
Eosinophils Absolute: 0.3 10*3/uL (ref 0.0–0.7)
Eosinophils Relative: 3 %
HEMATOCRIT: 41.5 % (ref 36.0–46.0)
HEMOGLOBIN: 13.3 g/dL (ref 12.0–15.0)
Lymphocytes Relative: 22 %
Lymphs Abs: 2.4 10*3/uL (ref 0.7–4.0)
MCH: 28.8 pg (ref 26.0–34.0)
MCHC: 32 g/dL (ref 30.0–36.0)
MCV: 89.8 fL (ref 78.0–100.0)
MONOS PCT: 6 %
Monocytes Absolute: 0.7 10*3/uL (ref 0.1–1.0)
NEUTROS ABS: 7.3 10*3/uL (ref 1.7–7.7)
NEUTROS PCT: 69 %
Platelets: 372 10*3/uL (ref 150–400)
RBC: 4.62 MIL/uL (ref 3.87–5.11)
RDW: 13.9 % (ref 11.5–15.5)
WBC: 10.7 10*3/uL — AB (ref 4.0–10.5)

## 2018-05-03 LAB — URINALYSIS, ROUTINE W REFLEX MICROSCOPIC
Bilirubin Urine: NEGATIVE
Glucose, UA: NEGATIVE mg/dL
Hgb urine dipstick: NEGATIVE
Ketones, ur: NEGATIVE mg/dL
LEUKOCYTES UA: NEGATIVE
NITRITE: NEGATIVE
PH: 7 (ref 5.0–8.0)
Protein, ur: NEGATIVE mg/dL
SPECIFIC GRAVITY, URINE: 1.014 (ref 1.005–1.030)

## 2018-05-03 LAB — PREGNANCY, URINE: PREG TEST UR: NEGATIVE

## 2018-05-03 LAB — LIPASE, BLOOD: LIPASE: 32 U/L (ref 11–51)

## 2018-05-03 MED ORDER — SODIUM CHLORIDE 0.9 % IV BOLUS
1000.0000 mL | Freq: Once | INTRAVENOUS | Status: AC
  Administered 2018-05-03: 1000 mL via INTRAVENOUS

## 2018-05-03 MED ORDER — HYDROCODONE-ACETAMINOPHEN 5-325 MG PO TABS: 1.0000 | ORAL_TABLET | Freq: Four times a day (QID) | ORAL | 0 refills | Status: DC | PRN

## 2018-05-03 MED ORDER — OXYBUTYNIN CHLORIDE 5 MG PO TABS: 5.0000 mg | ORAL_TABLET | Freq: Three times a day (TID) | ORAL | 0 refills | Status: DC | PRN

## 2018-05-03 NOTE — ED Notes (Signed)
Urine culture sent along with UA

## 2018-05-03 NOTE — ED Triage Notes (Addendum)
patient c/o mid abdominal pain that radiates into her urethra. Patient states she has had intense pain for the past 3 days. Patient states she was sent by her physician to r/o kidney stone and dehydration. Patient reports that she has had urinary frequency and vomiting.

## 2018-05-03 NOTE — ED Provider Notes (Addendum)
[facility]-EMERGENCY DEPT Provider Note   CSN: [id number] Arrival date & time: 05/03/18  1540     History   Chief Complaint Chief Complaint  Patient presents with  . Abdominal Pain  . Emesis    HPI Alicia Lucero is a 57 y.o. female.  HPI   Alicia Lucero is a 57 y.o. female, with a history of bipolar, DM, and HTN, presenting to the ED with abdominal pain and vomiting. Currently has suprapubic pain, throbbing, intermittent, 7/10, radiating "down the urethra."  Pain by urinary frequency.  States she has been seen and treated multiple times for UTI since May 2019.  Endorses single episodes of vomiting without preceding nausea beginning first week of Aug 2019. For the last few days she has had vomiting multiple times a day.  Episodes of vomiting are accompanied with onset of her pain.  August 7: Patient was seen by Dr. Seymour Bars, [facility].  She was treated with 7 days of Macrobid, but urine culture ended up being negative.  She also had a pelvic exam performed at that time and was told she is perimenopausal.  Patient has another appointment with OB/GYN August 28 for a pelvic ultrasound.  August 19: Patient was seen by her PCPs office.  Another pelvic exam was performed.  A CT of the abdomen/pelvis was then performed on August 21, showing no acute abnormalities.  Patient has an appointment scheduled for tomorrow, August 27, with [facility].  Denies fever/chills, current nausea, diarrhea, hematuria, abnormal vaginal discharge/bleeding, or any other complaints.   Past Medical History:  Diagnosis Date  . Allergy   . Asthma   . Back pain   . Bipolar 1 disorder (HCC)   . Chest pain   . Depression   . Diabetes mellitus without complication (HCC)   . Drug use   . Dyspnea   . Fatty liver   . Gallbladder problem   . GERD (gastroesophageal reflux disease)   . Hyperlipidemia   . Infertility, female   . Joint pain   . Neuropathy   . PCOS  (polycystic ovarian syndrome)   . PCOS (polycystic ovarian syndrome)   . Perimenopausal     Patient Active Problem List   Diagnosis Date Noted  . Type 2 diabetes mellitus with retinopathy, with long-term current use of insulin (HCC) 06/01/2017  . Vitamin D deficiency 06/01/2017  . Bipolar 2 disorder (HCC) 06/01/2017  . Class 3 obesity with serious comorbidity and body mass index (BMI) of 50.0 to 59.9 in adult 06/01/2017  . Primary osteoarthritis of left knee 09/08/2016  . Type 2 diabetes mellitus with diabetic neuropathy, with long-term current use of insulin (HCC) 07/19/2015  . Dyslipidemia associated with type 2 diabetes mellitus (HCC) 07/19/2015  . Benign essential HTN 07/19/2015  . Bipolar 1 disorder (HCC) 03/01/2015  . Morbid obesity with BMI of 50.0-59.9, adult (HCC) 12/26/2014  . Asthma, chronic 09/21/2013    Past Surgical History:  Procedure Laterality Date  . CHOLECYSTECTOMY  1996  . DILATION AND CURETTAGE OF UTERUS     secondary to menorrhagia  . KNEE ARTHROSCOPY Left 2008  . TONSILLECTOMY     as a child  . WISDOM TOOTH EXTRACTION       OB History    Gravida  1   Para      Term      Preterm      AB  1   Living  0     SAB  TAB  1   Ectopic      Multiple      Live Births               Home Medications    Prior to Admission medications   Medication Sig Start Date End Date Taking? Authorizing Provider  B Complex-C (B-COMPLEX WITH VITAMIN C) tablet Take 1 tablet by mouth daily.   Yes [provider]  clonazePAM (KLONOPIN) 1 MG tablet Take 1 tablet (1 mg total) by mouth 2 (two) times daily as needed. Patient taking differently: Take 1 mg by mouth 3 (three) times daily as needed for anxiety.  04/17/17  Yes Ethelda Chick, MD  gabapentin (NEURONTIN) 600 MG tablet Take 600 mg by mouth 2 (two) times daily. 04/21/18  Yes [provider]  GLUCOSA-CHONDR-NA CHONDR-MSM PO Take by mouth.   Yes [provider]  lamoTRIgine  (LAMICTAL) 200 MG tablet Take 2 tablets (400 mg total) by mouth every evening. 02/11/16  Yes Ethelda Chick, MD  liraglutide (VICTOZA) 18 MG/3ML SOPN Inject 0.1 mLs (0.6 mg total) into the skin every morning. Patient taking differently: Inject 1.8 mg into the skin at bedtime.  06/01/17  Yes Beasley, Caren D, MD  lisdexamfetamine (VYVANSE) 60 MG capsule Take 60 mg by mouth daily.    Yes [provider]  lisinopril (PRINIVIL,ZESTRIL) 5 MG tablet take 1 tablet by mouth once daily 09/22/17  Yes Ethelda Chick, MD  metFORMIN (GLUCOPHAGE-XR) 500 MG 24 hr tablet TAKE 2 TABLETS BY MOUTH TWICE DAILY 03/25/18  Yes Ethelda Chick, MD  Multiple Vitamins-Minerals (WOMENS MULTIVITAMIN PLUS PO) Take 1 tablet by mouth daily.    Yes [provider]  nystatin cream (MYCOSTATIN) Apply 1 application topically 2 (two) times daily. 08/27/16  Yes Ethelda Chick, MD  Omega-3 Fatty Acids (FISH OIL PO) Take 2 capsules by mouth 2 (two) times daily.   Yes [provider]  omeprazole (PRILOSEC) 20 MG capsule Take 20 mg by mouth daily. 04/26/18  Yes [provider]  ondansetron (ZOFRAN-ODT) 8 MG disintegrating tablet Take 8 mg by mouth 2 (two) times daily as needed for nausea or vomiting.  02/23/18  Yes [provider]  ranitidine (ZANTAC) 150 MG capsule TAKE ONE CAPSULE BY MOUTH TWICE DAILY 02/18/18  Yes Ethelda Chick, MD  sertraline (ZOLOFT) 100 MG tablet Take 2 tablets (200 mg total) by mouth daily. 02/11/16  Yes Ethelda Chick, MD  simvastatin (ZOCOR) 20 MG tablet take 1 tablet by mouth at bedtime 06/25/17  Yes Ethelda Chick, MD  terconazole (TERAZOL 3) 0.8 % vaginal cream Apply to affected area daily 05/03/18  Yes [provider]  TOUJEO SOLOSTAR 300 UNIT/ML SOPN INJECT 50 UNITS SUBCURANEOUSLY DAILY Patient taking differently: Inject 50 Units into the skin daily.  04/16/18  Yes Myles Lipps, MD  TRINTELLIX 5 MG TABS tablet Take 15 mg by mouth daily.  12/08/17  Yes  [provider]  ACCU-CHEK AVIVA PLUS test strip USE AS DIRECTED 05/07/14   [name], MD  ACCU-CHEK SOFTCLIX LANCETS lancets USE AS DIRECTED    Rhoderick Moody M, PA-C  albuterol (PROVENTIL) (2.5 MG/3ML) 0.083% nebulizer solution Take 3 mLs (2.5 mg total) by nebulization every 6 (six) hours as needed for wheezing or shortness of breath. Patient not taking: Reported on 05/03/2018 12/25/17   Ofilia Neas, PA-C  albuterol (VENTOLIN HFA) 108 (90 Base) MCG/ACT inhaler inhale 2 puffs by mouth every 4 hours if needed  for cough wheezing or shortness of breath Patient not taking: Reported on 05/03/2018 12/25/17   Ofilia Neaslark, Michael L, PA-C  B-D ULTRAFINE III SHORT PEN 31G X 8 MM MISC use as directed 09/05/16   Porfirio OarJeffery, Chelle, PA  Cholecalciferol (VITAMIN D) 2000 units tablet Take 1 tablet (2,000 Units total) by mouth daily. Patient not taking: Reported on 05/03/2018 06/01/17   Quillian QuinceBeasley, Caren D, MD  HYDROcodone-acetaminophen (NORCO/VICODIN) 5-325 MG tablet Take 1 tablet by mouth every 6 (six) hours as needed for severe pain. 05/03/18   Tauno Falotico C, PA-C  ipratropium-albuterol (DUONEB) 0.5-2.5 (3) MG/3ML SOLN Take 3 mLs by nebulization every 6 (six) hours as needed. Patient not taking: Reported on 05/03/2018 07/28/15   Elvina SidleLauenstein, Kurt, MD  ketoconazole (NIZORAL) 2 % cream Apply 1 application topically 2 (two) times daily. Patient not taking: Reported on 05/03/2018 07/19/15   Elvina SidleLauenstein, Kurt, MD  mupirocin ointment Idelle Jo(BACTROBAN) 2 % apply to affected area twice a day Patient not taking: Reported on 05/03/2018 12/10/16   Ethelda ChickSmith, Kristi M, MD  [facility] powder apply topically four times a day Patient not taking: Reported on 05/03/2018 12/10/16   Ethelda ChickSmith, Kristi M, MD  oxybutynin (DITROPAN) 5 MG tablet Take 1 tablet (5 mg total) by mouth 3 (three) times daily as needed for bladder spasms. 05/03/18   Esha Fincher C, PA-C  promethazine (PHENERGAN) 12.5 MG suppository Place 1 suppository (12.5 mg total) rectally every 8  (eight) hours as needed for nausea or vomiting. Patient not taking: Reported on 05/03/2018 04/16/18   Genia DelLavoie, Marie-Lyne, MD    Family History Family History  Problem Relation Age of Onset  . Cancer Mother 2963       lung  . Mental illness Mother        bipolar mania  . Hypertension Mother   . Depression Mother   . Cancer Father        liver  . Hypertension Father   . Heart disease Father [id number]       CAD/CABG  . Multiple sclerosis Sister   . Hypertension Sister   . Diabetes Sister     Social History Social History   Tobacco Use  . Smoking status: Former Smoker    Types: Cigarettes    Last attempt to quit: 06/30/2005    Years since quitting: 12.8  . Smokeless tobacco: Never Used  . Tobacco comment: electronic cigarettes PRN  Substance Use Topics  . Alcohol use: No    Alcohol/week: 0.0 standard drinks    Comment: quit: 2007  . Drug use: No    Comment: occasional     Allergies   Empagliflozin; Empagliflozin-linagliptin; Other; and Oxycodone   Review of Systems Review of Systems  Constitutional: Negative for chills, diaphoresis and fever.  Respiratory: Negative for shortness of breath.   Cardiovascular: Negative for chest pain.  Gastrointestinal: Positive for abdominal pain, nausea and vomiting. Negative for blood in stool and diarrhea.  Genitourinary: Positive for dysuria and frequency. Negative for flank pain, hematuria, vaginal bleeding, vaginal discharge and vaginal pain.  Musculoskeletal: Negative for back pain.  All other systems reviewed and are negative.    Physical Exam Updated Vital Signs BP 102/70   Pulse 70   Resp 18   SpO2 93%   Physical Exam  Constitutional: She appears well-developed and well-nourished. No distress.  HENT:  Head: Normocephalic and atraumatic.  Eyes: Conjunctivae are normal.  Neck: Neck supple.  Cardiovascular: Normal rate, regular rhythm, normal heart sounds and intact distal pulses.  Pulmonary/Chest:  Effort normal and breath  sounds normal. No respiratory distress.  Abdominal: Soft. There is no tenderness. There is no guarding.  Musculoskeletal: She exhibits no edema.  Lymphadenopathy:    She has no cervical adenopathy.  Neurological: She is alert.  Skin: Skin is warm and dry. She is not diaphoretic.  Psychiatric: She has a normal mood and affect. Her behavior is normal.  Nursing note and vitals reviewed.    ED Treatments / Results  Labs (all labs ordered are listed, but only abnormal results are displayed) Labs Reviewed  COMPREHENSIVE METABOLIC PANEL - Abnormal; Notable for the following components:      Result Value   Glucose, Bld 116 (*)    All other components within normal limits  CBC WITH DIFFERENTIAL/PLATELET - Abnormal; Notable for the following components:   WBC 10.7 (*)    All other components within normal limits  URINE CULTURE  LIPASE, BLOOD  URINALYSIS, ROUTINE W REFLEX MICROSCOPIC  PREGNANCY, URINE    EKG None  Radiology No results found.  Procedures Procedures (including critical care time)  Medications Ordered in ED Medications  sodium chloride 0.9 % bolus 1,000 mL (0 mLs Intravenous Stopped 05/03/18 1913)     Initial Impression / Assessment and Plan / ED Course  I have reviewed the triage vital signs and the nursing notes.  Pertinent labs & imaging results that were available during my care of the patient were reviewed by me and considered in my medical decision making (see chart for details).  Clinical Course as of May 04 1939  Mon May 03, 2018  1759 Spoke with Marcelino Duster, pharmacist, for advice on medication choices for the patient's possible bladder spasms.  Recommends oxybutynin 5 mg 3 times daily as needed.   [SJ]    Clinical Course User Index [SJ] Willy Vorce C, PA-C    Patient presents with recurrent suprapubic pain.  She has been treated multiple times for UTI without success.  I have some suspicion for bladder spasms in the patient.  Patient is nontoxic  appearing, afebrile, not tachycardic, not tachypneic, not hypotensive, and is in no apparent distress.  Abdominal exam benign. She already has appropriate follow-up scheduled with OB/GYN, PCP, and GI.  I suspect she will need to follow-up with urology. The patient was given instructions for home care as well as return precautions. Patient voices understanding of these instructions, accepts the plan, and is comfortable with discharge.   Findings and plan of care discussed with Tilden Fossa, MD. Dr. Madilyn Hook personally evaluated and examined this patient.   Search of the  narcotic database shows no opiate prescriptions.  Vitals:   05/03/18 1719 05/03/18 1730 05/03/18 1905 05/03/18 1906  BP: 102/70 116/67 115/66   Pulse: 70 73 74   Resp: 18 18 18    Temp:    98.7 F (37.1 C)  TempSrc:    Oral  SpO2: 93% 95% 97%      Final Clinical Impressions(s) / ED Diagnoses   Final diagnoses:  Lower abdominal pain    ED Discharge Orders         Ordered    oxybutynin (DITROPAN) 5 MG tablet  3 times daily PRN     05/03/18 1908    HYDROcodone-acetaminophen (NORCO/VICODIN) 5-325 MG tablet  Every 6 hours PRN     05/03/18 1908           Anselm Pancoast, PA-C 05/03/18 1940    Anselm Pancoast, PA-C 05/03/18 1941    Tilden Fossa,  MD 05/04/18 1458

## 2018-05-03 NOTE — Discharge Instructions (Signed)
Lab results were reassuring.  Please follow-up with urology on this matter as well as keeping your other previously scheduled appointments.  Zofran: May use the Zofran, as needed, for nausea/vomiting. Oxybutynin: Medication is used for bladder spasms.  It may or may not help your pain.  Use caution as this medication can cause lightheadedness or dizziness. Acetaminophen: May take acetaminophen (generic for Tylenol), as needed, for pain. Your daily total maximum amount of acetaminophen from all sources should be limited to 4000mg /day for persons without liver problems, or 2000mg /day for those with liver problems. Vicodin: May take Vicodin (hydrocodone-acetaminophen) as needed for severe pain.  Do not drive or perform other dangerous activities while taking the Vicodin.  Please note that each pill of Vicodin contains 325 mg of acetaminophen (Tylenol) and the above dosage limits apply.

## 2018-05-05 ENCOUNTER — Ambulatory Visit: Payer: BLUE CROSS/BLUE SHIELD | Admitting: Obstetrics & Gynecology

## 2018-05-05 ENCOUNTER — Other Ambulatory Visit: Payer: Self-pay | Admitting: Obstetrics & Gynecology

## 2018-05-05 ENCOUNTER — Ambulatory Visit (INDEPENDENT_AMBULATORY_CARE_PROVIDER_SITE_OTHER): Payer: BLUE CROSS/BLUE SHIELD

## 2018-05-05 DIAGNOSIS — Z780 Asymptomatic menopausal state: Secondary | ICD-10-CM | POA: Diagnosis not present

## 2018-05-05 DIAGNOSIS — R102 Pelvic and perineal pain: Secondary | ICD-10-CM | POA: Diagnosis not present

## 2018-05-05 DIAGNOSIS — B372 Candidiasis of skin and nail: Secondary | ICD-10-CM

## 2018-05-05 LAB — URINE CULTURE: Culture: <10000 — AB

## 2018-05-05 MED ORDER — TERCONAZOLE 0.8 % VA CREA: 1.0000 | TOPICAL_CREAM | Freq: Every day | VAGINAL | 3 refills | Status: AC

## 2018-05-05 NOTE — Progress Notes (Signed)
Alicia Lucero 1961-05-24 [id number]        57 y.o.  G1P0010 single  RP: Right pelvic pain  HPI: Right intermittent pelvic pain and tenderness.  The pain is worse when sitting per patient.  Menopause on no hormone replacement therapy.  No postmenopausal bleeding.  Abstinent.  Urine and bowel movements normal.  No fever.   OB History  Gravida Para Term Preterm AB Living  1       1 0  SAB TAB Ectopic Multiple Live Births    1          # Outcome Date GA Lbr Len/2nd Weight Sex Delivery Anes PTL Lv  1 TAB             Past medical history,surgical history, problem list, medications, allergies, family history and social history were all reviewed and documented in the EPIC chart.   Directed ROS with pertinent positives and negatives documented in the history of present illness/assessment and plan.  Exam:  There were no vitals filed for this visit. General appearance:  Normal  Abdomen: Obese.  Tender at symphysis pubis and along the pubic bone to the right.  Gynecologic exam: Pubic tenderness.  Pelvic US today: T/V images.  Uterus anteverted homogeneous measuring 6.86 x 4.97 x 3.49.  Endometrial lining very thin and normal at 0.9 mm.  Right and left ovaries normal.  No apparant mass in the right adnexa.  Left adnexal thin-walled echo-free cyst measuring 1.7 x 1.2 cm.  T/a images in the right lower quadrant, which is the area of pain, with no evidence of mass.  Limited images T/A due to the pendulous abdomen.   Assessment/Plan:  57 y.o. G1P0010   1.  Right pelvic pain in female Pelvic ultrasound findings reviewed with patient.  Uterus and endometrial line normal.  Right and left ovaries normal.  No apparent mass in the right adnexa.  Will see Orthopedist, Diastasis of the Symphysis Pubis? and Urologist..  2. Yeast infection of the skin Preferred using an antifungal cream.  Terconazole 0.8% cream re-prescribed.  3. Menopause present Well on no hormone replacement therapy.  No  postmenopausal bleeding.  Other orders - terconazole (TERAZOL 3) 0.8 % vaginal cream; Place 1 applicator vaginally at bedtime for 3 days. Apply to affected area daily  Counseling on above issues and coordination of care more than 50% for 15 minutes.  Genia Del MD, 2:39 PM 05/05/2018

## 2018-05-08 ENCOUNTER — Encounter: Payer: Self-pay | Admitting: Obstetrics & Gynecology

## 2018-05-08 NOTE — Patient Instructions (Signed)
1.  Right pelvic pain in female Pelvic ultrasound findings reviewed with patient.  Uterus and endometrial line normal.  Right and left ovaries normal.  No apparent mass in the right adnexa.  Will see Orthopedist, Diastasis of the Symphysis Pubis? and Urologist..  2. Yeast infection of the skin Preferred using an antifungal cream.  Terconazole 0.8% cream re-prescribed.  3. Menopause present Well on no hormone replacement therapy.  No postmenopausal bleeding.  Other orders - terconazole (TERAZOL 3) 0.8 % vaginal cream; Place 1 applicator vaginally at bedtime for 3 days. Apply to affected area daily  Inetta Fermoina, it was a pleasure seeing you today!

## 2018-05-24 ENCOUNTER — Encounter: Payer: Self-pay | Admitting: Physician Assistant

## 2018-06-03 ENCOUNTER — Encounter (HOSPITAL_COMMUNITY): Payer: Self-pay | Admitting: Emergency Medicine

## 2018-06-03 ENCOUNTER — Emergency Department (HOSPITAL_COMMUNITY)
Admission: EM | Admit: 2018-06-03 | Discharge: 2018-06-04 | Disposition: A | Discharge status: Home / Self Care | Payer: BLUE CROSS/BLUE SHIELD | Attending: Emergency Medicine | Admitting: Emergency Medicine

## 2018-06-03 ENCOUNTER — Other Ambulatory Visit: Payer: Self-pay

## 2018-06-03 DIAGNOSIS — Z79899 Other long term (current) drug therapy: Secondary | ICD-10-CM | POA: Diagnosis not present

## 2018-06-03 DIAGNOSIS — G8929 Other chronic pain: Secondary | ICD-10-CM | POA: Diagnosis not present

## 2018-06-03 DIAGNOSIS — R1084 Generalized abdominal pain: Secondary | ICD-10-CM | POA: Diagnosis not present

## 2018-06-03 DIAGNOSIS — Z794 Long term (current) use of insulin: Secondary | ICD-10-CM | POA: Insufficient documentation

## 2018-06-03 DIAGNOSIS — Z87891 Personal history of nicotine dependence: Secondary | ICD-10-CM | POA: Insufficient documentation

## 2018-06-03 DIAGNOSIS — R109 Unspecified abdominal pain: Secondary | ICD-10-CM

## 2018-06-03 DIAGNOSIS — J45909 Unspecified asthma, uncomplicated: Secondary | ICD-10-CM | POA: Insufficient documentation

## 2018-06-03 DIAGNOSIS — I10 Essential (primary) hypertension: Secondary | ICD-10-CM | POA: Insufficient documentation

## 2018-06-03 DIAGNOSIS — F12288 Cannabis dependence with other cannabis-induced disorder: Secondary | ICD-10-CM | POA: Diagnosis not present

## 2018-06-03 DIAGNOSIS — R112 Nausea with vomiting, unspecified: Secondary | ICD-10-CM | POA: Diagnosis present

## 2018-06-03 DIAGNOSIS — E119 Type 2 diabetes mellitus without complications: Secondary | ICD-10-CM | POA: Diagnosis not present

## 2018-06-03 DIAGNOSIS — N3000 Acute cystitis without hematuria: Secondary | ICD-10-CM

## 2018-06-03 NOTE — ED Notes (Signed)
Bed: ZO10 Expected date:  Expected time:  Means of arrival:  Comments: 57 yr old female, nausea, vomiting

## 2018-06-03 NOTE — ED Notes (Signed)
Pt states that she had eaten red grapes and had a strawberry milkshake >60 minutes prior to having emesis. Pt did receive 4mg  IV Zofran in route with EMS

## 2018-06-03 NOTE — ED Triage Notes (Signed)
Pt attempted from home after having a bright red episode of emesis. Pt with emesis a second time that wasn't bright red. Pt denies any black tarry stool. Pt has had intermittent n/v since June of this year and has been seen my medical professionals without diagnosis. Pt also promotes chronic pain of pelvis and dizziness intermittently. Pt complains of tenderness to RLQ and did experience dizziness after she vomited this evening. Vitals with EMS were BP-136/82, P-82, RR-14, 119 CBG and 95% on Rm Air. Pt to have colonoscopy and to see a urologist however appointments aren't until October

## 2018-06-04 ENCOUNTER — Encounter (HOSPITAL_COMMUNITY): Payer: Self-pay | Admitting: Emergency Medicine

## 2018-06-04 ENCOUNTER — Emergency Department (HOSPITAL_COMMUNITY): Payer: BLUE CROSS/BLUE SHIELD

## 2018-06-04 LAB — URINALYSIS, ROUTINE W REFLEX MICROSCOPIC
Bilirubin Urine: NEGATIVE
Glucose, UA: NEGATIVE mg/dL
Hgb urine dipstick: NEGATIVE
KETONES UR: NEGATIVE mg/dL
Leukocytes, UA: NEGATIVE
Nitrite: POSITIVE — AB
PROTEIN: NEGATIVE mg/dL
Specific Gravity, Urine: 1.021 (ref 1.005–1.030)
pH: 8 (ref 5.0–8.0)

## 2018-06-04 LAB — COMPREHENSIVE METABOLIC PANEL
ALT: 18 U/L (ref 0–44)
ANION GAP: 9 (ref 5–15)
AST: 20 U/L (ref 15–41)
Albumin: 3.6 g/dL (ref 3.5–5.0)
Alkaline Phosphatase: 72 U/L (ref 38–126)
BILIRUBIN TOTAL: 0.3 mg/dL (ref 0.3–1.2)
BUN: 15 mg/dL (ref 6–20)
CHLORIDE: 106 mmol/L (ref 98–111)
CO2: 26 mmol/L (ref 22–32)
Calcium: 9 mg/dL (ref 8.9–10.3)
Creatinine, Ser: 0.7 mg/dL (ref 0.44–1.00)
Glucose, Bld: 126 mg/dL — ABNORMAL HIGH (ref 70–99)
POTASSIUM: 4.3 mmol/L (ref 3.5–5.1)
Sodium: 141 mmol/L (ref 135–145)
TOTAL PROTEIN: 6.7 g/dL (ref 6.5–8.1)

## 2018-06-04 LAB — RAPID URINE DRUG SCREEN, HOSP PERFORMED
AMPHETAMINES: NOT DETECTED
Barbiturates: NOT DETECTED
Benzodiazepines: NOT DETECTED
COCAINE: NOT DETECTED
Opiates: NOT DETECTED
Tetrahydrocannabinol: POSITIVE — AB

## 2018-06-04 LAB — CBC WITH DIFFERENTIAL/PLATELET
Basophils Absolute: 0 10*3/uL (ref 0.0–0.1)
Basophils Relative: 0 %
EOS PCT: 4 %
Eosinophils Absolute: 0.4 10*3/uL (ref 0.0–0.7)
HEMATOCRIT: 38.7 % (ref 36.0–46.0)
Hemoglobin: 12.4 g/dL (ref 12.0–15.0)
LYMPHS ABS: 2.1 10*3/uL (ref 0.7–4.0)
LYMPHS PCT: 21 %
MCH: 28.9 pg (ref 26.0–34.0)
MCHC: 32 g/dL (ref 30.0–36.0)
MCV: 90.2 fL (ref 78.0–100.0)
MONO ABS: 0.6 10*3/uL (ref 0.1–1.0)
MONOS PCT: 6 %
NEUTROS ABS: 6.9 10*3/uL (ref 1.7–7.7)
Neutrophils Relative %: 69 %
PLATELETS: 317 10*3/uL (ref 150–400)
RBC: 4.29 MIL/uL (ref 3.87–5.11)
RDW: 13.7 % (ref 11.5–15.5)
WBC: 10.1 10*3/uL (ref 4.0–10.5)

## 2018-06-04 MED ORDER — ONDANSETRON 4 MG PO TBDP: ORAL_TABLET | ORAL | 0 refills | Status: DC

## 2018-06-04 MED ORDER — KETOROLAC TROMETHAMINE 30 MG/ML IJ SOLN
15.0000 mg | Freq: Once | INTRAMUSCULAR | Status: AC
  Administered 2018-06-04: 15 mg via INTRAVENOUS
  Filled 2018-06-04: qty 1

## 2018-06-04 MED ORDER — NITROFURANTOIN MONOHYD MACRO 100 MG PO CAPS: 100.0000 mg | ORAL_CAPSULE | Freq: Two times a day (BID) | ORAL | 0 refills | Status: DC

## 2018-06-04 MED ORDER — HALOPERIDOL LACTATE 5 MG/ML IJ SOLN
2.0000 mg | Freq: Once | INTRAMUSCULAR | Status: AC
  Administered 2018-06-04: 2 mg via INTRAVENOUS
  Filled 2018-06-04: qty 1

## 2018-06-04 MED ORDER — DICYCLOMINE HCL 20 MG PO TABS: 20.0000 mg | ORAL_TABLET | Freq: Two times a day (BID) | ORAL | 0 refills | Status: DC

## 2018-06-04 MED ORDER — GI COCKTAIL ~~LOC~~
30.0000 mL | Freq: Once | ORAL | Status: AC
  Administered 2018-06-04: 30 mL via ORAL
  Filled 2018-06-04: qty 30

## 2018-06-04 NOTE — ED Notes (Signed)
Pt says she's hot, nauseous, dizzy and confused.

## 2018-06-04 NOTE — ED Provider Notes (Signed)
[facility]-EMERGENCY DEPT Provider Note   CSN: [id number] Arrival date & time: 06/03/18  2204     History   Chief Complaint Chief Complaint  Patient presents with  . Hematemesis    HPI Alicia Lucero is a 57 y.o. female.  The history is provided by the patient.  Abdominal Pain   This is a chronic problem. The problem occurs constantly. The pain is associated with an unknown factor. The pain is located in the generalized abdominal region and RLQ. The quality of the pain is sharp. The pain is severe. Associated symptoms include nausea and vomiting. Pertinent negatives include anorexia and fever. Nothing aggravates the symptoms. Nothing relieves the symptoms. Past workup includes CT scan. Her past medical history does not include PUD, ulcerative colitis or Crohn's disease.  States no one can find the source of her ongoing abdominal pain.  Thought she had blood in her emesis this evening but she ate red grapes and a strawberry milkshake just prior to the emesis.  Denies f/c/r.  No appetite changes.     Past Medical History:  Diagnosis Date  . Allergy   . Asthma   . Back pain   . Bipolar 1 disorder (HCC)   . Chest pain   . Depression   . Diabetes mellitus without complication (HCC)   . Drug use   . Dyspnea   . Fatty liver   . Gallbladder problem   . GERD (gastroesophageal reflux disease)   . Hyperlipidemia   . Infertility, female   . Joint pain   . Neuropathy   . PCOS (polycystic ovarian syndrome)   . PCOS (polycystic ovarian syndrome)   . Perimenopausal     Patient Active Problem List   Diagnosis Date Noted  . Type 2 diabetes mellitus with retinopathy, with long-term current use of insulin (HCC) 06/01/2017  . Vitamin D deficiency 06/01/2017  . Bipolar 2 disorder (HCC) 06/01/2017  . Class 3 obesity with serious comorbidity and body mass index (BMI) of 50.0 to 59.9 in adult 06/01/2017  . Primary osteoarthritis of left knee 09/08/2016  . Type 2  diabetes mellitus with diabetic neuropathy, with long-term current use of insulin (HCC) 07/19/2015  . Dyslipidemia associated with type 2 diabetes mellitus (HCC) 07/19/2015  . Benign essential HTN 07/19/2015  . Bipolar 1 disorder (HCC) 03/01/2015  . Morbid obesity with BMI of 50.0-59.9, adult (HCC) 12/26/2014  . Asthma, chronic 09/21/2013    Past Surgical History:  Procedure Laterality Date  . CHOLECYSTECTOMY  1996  . DILATION AND CURETTAGE OF UTERUS     secondary to menorrhagia  . KNEE ARTHROSCOPY Left 2008  . TONSILLECTOMY     as a child  . WISDOM TOOTH EXTRACTION       OB History    Gravida  1   Para      Term      Preterm      AB  1   Living  0     SAB      TAB  1   Ectopic      Multiple      Live Births               Home Medications    Prior to Admission medications   Medication Sig Start Date End Date Taking? Authorizing Provider  ACCU-CHEK AVIVA PLUS test strip USE AS DIRECTED 05/07/14   [name], MD  ACCU-CHEK SOFTCLIX LANCETS lancets USE AS DIRECTED    Rhoderick Moody  M, PA-C  albuterol (PROVENTIL) (2.5 MG/3ML) 0.083% nebulizer solution Take 3 mLs (2.5 mg total) by nebulization every 6 (six) hours as needed for wheezing or shortness of breath. Patient not taking: Reported on 05/03/2018 12/25/17   Ofilia Neas, PA-C  albuterol (VENTOLIN HFA) 108 (90 Base) MCG/ACT inhaler inhale 2 puffs by mouth every 4 hours if needed for cough wheezing or shortness of breath Patient not taking: Reported on 05/03/2018 12/25/17   Ofilia Neas, PA-C  B Complex-C (B-COMPLEX WITH VITAMIN C) tablet Take 1 tablet by mouth daily.    [provider]  B-D ULTRAFINE III SHORT PEN 31G X 8 MM MISC use as directed 09/05/16   Porfirio Oar, PA  Cholecalciferol (VITAMIN D) 2000 units tablet Take 1 tablet (2,000 Units total) by mouth daily. Patient not taking: Reported on 05/03/2018 06/01/17   Quillian Quince D, MD  clonazePAM (KLONOPIN) 1 MG tablet Take 1  tablet (1 mg total) by mouth 2 (two) times daily as needed. Patient taking differently: Take 1 mg by mouth 3 (three) times daily as needed for anxiety.  04/17/17   Ethelda Chick, MD  gabapentin (NEURONTIN) 600 MG tablet Take 600 mg by mouth 2 (two) times daily. 04/21/18   [provider]  GLUCOSA-CHONDR-NA CHONDR-MSM PO Take by mouth.    [provider]  HYDROcodone-acetaminophen (NORCO/VICODIN) 5-325 MG tablet Take 1 tablet by mouth every 6 (six) hours as needed for severe pain. 05/03/18   Joy, Shawn C, PA-C  ipratropium-albuterol (DUONEB) 0.5-2.5 (3) MG/3ML SOLN Take 3 mLs by nebulization every 6 (six) hours as needed. Patient not taking: Reported on 05/03/2018 07/28/15   [name], MD  ketoconazole (NIZORAL) 2 % cream Apply 1 application topically 2 (two) times daily. Patient not taking: Reported on 05/03/2018 07/19/15   [name], MD  lamoTRIgine (LAMICTAL) 200 MG tablet Take 2 tablets (400 mg total) by mouth every evening. 02/11/16   Ethelda Chick, MD  liraglutide (VICTOZA) 18 MG/3ML SOPN Inject 0.1 mLs (0.6 mg total) into the skin every morning. Patient taking differently: Inject 1.8 mg into the skin at bedtime.  06/01/17   Quillian Quince D, MD  lisdexamfetamine (VYVANSE) 60 MG capsule Take 60 mg by mouth daily.     [provider]  lisinopril (PRINIVIL,ZESTRIL) 5 MG tablet take 1 tablet by mouth once daily 09/22/17   Ethelda Chick, MD  metFORMIN (GLUCOPHAGE-XR) 500 MG 24 hr tablet TAKE 2 TABLETS BY MOUTH TWICE DAILY 03/25/18   Ethelda Chick, MD  Multiple Vitamins-Minerals (WOMENS MULTIVITAMIN PLUS PO) Take 1 tablet by mouth daily.     [provider]  mupirocin ointment (BACTROBAN) 2 % apply to affected area twice a day Patient not taking: Reported on 05/03/2018 12/10/16   Ethelda Chick, MD  [facility] powder apply topically four times a day Patient not taking: Reported on 05/03/2018 12/10/16   Ethelda Chick, MD  nystatin cream (MYCOSTATIN) Apply 1  application topically 2 (two) times daily. 08/27/16   Ethelda Chick, MD  Omega-3 Fatty Acids (FISH OIL PO) Take 2 capsules by mouth 2 (two) times daily.    [provider]  omeprazole (PRILOSEC) 20 MG capsule Take 20 mg by mouth daily. 04/26/18   [provider]  ondansetron (ZOFRAN-ODT) 8 MG disintegrating tablet Take 8 mg by mouth 2 (two) times daily as needed for nausea or vomiting.  02/23/18   [provider]  oxybutynin (DITROPAN) 5 MG tablet Take 1 tablet (5  mg total) by mouth 3 (three) times daily as needed for bladder spasms. 05/03/18   Joy, Shawn C, PA-C  promethazine (PHENERGAN) 12.5 MG suppository Place 1 suppository (12.5 mg total) rectally every 8 (eight) hours as needed for nausea or vomiting. Patient not taking: Reported on 05/03/2018 04/16/18   Genia Del, MD  ranitidine (ZANTAC) 150 MG capsule TAKE ONE CAPSULE BY MOUTH TWICE DAILY 02/18/18   Ethelda Chick, MD  sertraline (ZOLOFT) 100 MG tablet Take 2 tablets (200 mg total) by mouth daily. 02/11/16   Ethelda Chick, MD  simvastatin (ZOCOR) 20 MG tablet take 1 tablet by mouth at bedtime 06/25/17   Ethelda Chick, MD  TOUJEO SOLOSTAR 300 UNIT/ML SOPN INJECT 50 UNITS SUBCURANEOUSLY DAILY Patient taking differently: Inject 50 Units into the skin daily.  04/16/18   Myles Lipps, MD  TRINTELLIX 5 MG TABS tablet Take 15 mg by mouth daily.  12/08/17   [provider]    Family History Family History  Problem Relation Age of Onset  . Cancer Mother 67       lung  . Mental illness Mother        bipolar mania  . Hypertension Mother   . Depression Mother   . Cancer Father        liver  . Hypertension Father   . Heart disease Father 81       CAD/CABG  . Multiple sclerosis Sister   . Hypertension Sister   . Diabetes Sister     Social History Social History   Tobacco Use  . Smoking status: Former Smoker    Types: Cigarettes    Last attempt to quit: 06/30/2005    Years since quitting:  12.9  . Smokeless tobacco: Never Used  . Tobacco comment: electronic cigarettes PRN  Substance Use Topics  . Alcohol use: No    Alcohol/week: 0.0 standard drinks    Comment: quit: 2007  . Drug use: No    Comment: occasional     Allergies   Empagliflozin; Empagliflozin-linagliptin; Other; and Oxycodone   Review of Systems Review of Systems  Constitutional: Negative for appetite change and fever.  Respiratory: Negative for shortness of breath.   Cardiovascular: Negative for chest pain.  Gastrointestinal: Positive for abdominal pain, nausea and vomiting. Negative for anorexia.  Genitourinary: Negative for flank pain, vaginal bleeding, vaginal discharge and vaginal pain.  All other systems reviewed and are negative.    Physical Exam Updated Vital Signs BP (!) 108/51   Pulse 61   Temp 98.2 F (36.8 C) (Oral)   Resp 18   Ht 5\' 2"  (1.575 m)   Wt (!) 146.7 kg   LMP 03/28/2018   SpO2 92%   BMI 59.17 kg/m   Physical Exam  Constitutional: She is oriented to person, place, and time. She appears well-developed and well-nourished. No distress.  HENT:  Head: Normocephalic and atraumatic.  Mouth/Throat: No oropharyngeal exudate.  No blood in the mouth or posterior oropharynx  Eyes: Pupils are equal, round, and reactive to light. Conjunctivae are normal.  Neck: Normal range of motion. Neck supple.  Cardiovascular: Normal rate, regular rhythm, normal heart sounds and intact distal pulses.  Pulmonary/Chest: Effort normal and breath sounds normal. No stridor. She has no wheezes. She has no rales.  Abdominal: Soft. Bowel sounds are normal. She exhibits no mass. There is no tenderness. There is no rebound and no guarding. No hernia.  Musculoskeletal: Normal range of motion.  Neurological: She is alert  and oriented to person, place, and time. She displays normal reflexes.  Skin: Skin is warm and dry. Capillary refill takes less than 2 seconds.  Psychiatric: She has a normal mood and  affect.     ED Treatments / Results  Labs (all labs ordered are listed, but only abnormal results are displayed) Labs Reviewed  COMPREHENSIVE METABOLIC PANEL - Abnormal; Notable for the following components:      Result Value   Glucose, Bld 126 (*)    All other components within normal limits  URINALYSIS, ROUTINE W REFLEX MICROSCOPIC - Abnormal; Notable for the following components:   Color, Urine AMBER (*)    APPearance HAZY (*)    Nitrite POSITIVE (*)    Bacteria, UA RARE (*)    All other components within normal limits  RAPID URINE DRUG SCREEN, HOSP PERFORMED - Abnormal; Notable for the following components:   Tetrahydrocannabinol POSITIVE (*)    All other components within normal limits  CBC WITH DIFFERENTIAL/PLATELET  POC OCCULT BLOOD, ED    EKG EKG Interpretation  Date/Time:  Thursday June 03 2018 23:44:33 EDT Ventricular Rate:  68 PR Interval:    QRS Duration: 100 QT Interval:  420 QTC Calculation: 447 R Axis:   -42 Text Interpretation:  Sinus rhythm Left axis deviation Low voltage, precordial leads Confirmed by Nicanor Alcon, Victorina Kable ([id number]) on 06/03/2018 11:49:35 PM   Radiology Ct Renal Stone Study  Result Date: 06/04/2018 CLINICAL DATA:  Nausea and vomiting. Abdominal pain. History of cholecystectomy. EXAM: CT ABDOMEN AND PELVIS WITHOUT CONTRAST TECHNIQUE: Multidetector CT imaging of the abdomen and pelvis was performed following the standard protocol without IV contrast. COMPARISON:  03/03/2012 CT abdomen/pelvis. FINDINGS: Lower chest: Perifissural 5 mm anterior right lower lobe nodule (series 4/image 6), stable since 2013, considered benign. Sub solid basilar right lower lobe 5 mm nodule (series 4/image 59), not definitely seen on prior scan. Hepatobiliary: Normal liver size. No liver mass. Cholecystectomy. No biliary ductal dilatation. Pancreas: Normal, with no mass or duct dilation. Spleen: Normal size. No mass. Adrenals/Urinary Tract: Normal adrenals. No contour  deforming renal mass. No hydronephrosis. Nonobstructing 4 mm upper left renal stone. No additional renal stones. Normal caliber ureters, with no ureteral stones. Normal bladder. Stomach/Bowel: Normal non-distended stomach. Normal caliber small bowel with no small bowel wall thickening. Normal appendix. Moderate sigmoid diverticulosis, with no large bowel wall thickening or acute pericolonic fat stranding. Vascular/Lymphatic: Normal caliber abdominal aorta. No pathologically enlarged lymph nodes in the abdomen or pelvis. Reproductive: Grossly normal uterus.  No adnexal mass. Other: No pneumoperitoneum, ascites or focal fluid collection. Musculoskeletal: No aggressive appearing focal osseous lesions. Mild thoracolumbar spondylosis. IMPRESSION: 1. No evidence of bowel obstruction or acute bowel inflammation. Moderate sigmoid diverticulosis, with no evidence of acute diverticulitis. Normal appendix. 2. Nonobstructing left nephrolithiasis. 3. Subsolid 5 mm right lower lobe pulmonary nodule. No follow-up recommended. This recommendation follows the consensus statement: Guidelines for Management of Incidental Pulmonary Nodules Detected on CT Images: From the Fleischner Society 2017; Radiology 2017; 284:228-243. Electronically Signed   By: Delbert Phenix M.D.   On: 06/04/2018 00:43    Procedures Procedures (including critical care time)  Medications Ordered in ED Medications  haloperidol lactate (HALDOL) injection 2 mg (2 mg Intravenous Given 06/04/18 0103)  ketorolac (TORADOL) 30 MG/ML injection 15 mg (15 mg Intravenous Given 06/04/18 0104)  gi cocktail (Maalox,Lidocaine,Donnatal) (30 mLs Oral Given 06/04/18 0104)       Final Clinical Impressions(s) / ED Diagnoses   Suspect that the patient saw  red from strawberries in emesis.  There was no emesis in the ED even prior to medication.  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  I will not be prescribing narcotics.  I  will treat the mild uti with macrobid and prescribe zofran and bentyl.  Follow up with your PMD for ongoing care.    Return for weakness, numbness, changes in vision or speech, fevers >100.4 unrelieved by medication, shortness of breath, intractable vomiting, or diarrhea, abdominal pain, Inability to tolerate liquids or food, cough, altered mental status or any concerns. No signs of systemic illness or infection. The patient is nontoxic-appearing on exam and vital signs are within normal limits.    I have reviewed the triage vital signs and the nursing notes. Pertinent labs &imaging results that were available during my care of the patient were reviewed by me and considered in my medical decision making (see chart for details).  After history, exam, and medical workup I feel the patient has been appropriately medically screened and is safe for discharge home. Pertinent diagnoses were discussed with the patient. Patient was given return precautions.     Taela Charbonneau, MD 06/04/18 0230

## 2018-06-09 ENCOUNTER — Other Ambulatory Visit: Payer: Self-pay | Admitting: Family Medicine

## 2018-06-09 DIAGNOSIS — E1140 Type 2 diabetes mellitus with diabetic neuropathy, unspecified: Secondary | ICD-10-CM

## 2018-06-09 DIAGNOSIS — Z794 Long term (current) use of insulin: Principal | ICD-10-CM

## 2018-06-09 NOTE — Telephone Encounter (Signed)
Attempted to call patient and schedule an appointment for refills. No answer, left message that she can call the office back and schedule.  Pt overdue for labs. Last HA1C   Was 05/18/17.  LOV   12/25/17 with Deliah Boston NOV  None scheduled. Med  Toujeo Solostar 300 Unit/ml Last refilled 04/16/18

## 2018-06-10 ENCOUNTER — Other Ambulatory Visit: Payer: Self-pay | Admitting: Family Medicine

## 2018-06-10 DIAGNOSIS — E1140 Type 2 diabetes mellitus with diabetic neuropathy, unspecified: Secondary | ICD-10-CM

## 2018-06-10 DIAGNOSIS — Z794 Long term (current) use of insulin: Principal | ICD-10-CM

## 2018-06-10 NOTE — Telephone Encounter (Signed)
Attempted to contact pt regarding scheduling her new pt appointment; left message on voicemail [phone]; pharmacy note dated 04/16/18 states "pt needs to schedule an appointment for further refills; will route to office for final disposition; last A1C 04/17/17.   Requested Prescriptions  Pending Prescriptions Disp Refills  . TOUJEO SOLOSTAR 300 UNIT/ML SOPN [Pharmacy Med Name: TOUJEO SOLOSTAR 300U/ML PEN 1.5ML]  0    Sig: INJECT 50 UNITS SUBCUTANEOUSLY DAILY     Endocrinology:  Diabetes - Insulins Failed - 06/10/2018  1:04 PM      Failed - HBA1C is between 0 and 7.9 and within 180 days    Hgb A1c MFr Bld  Date Value Ref Range Status  05/18/2017 7.4 (H) 4.8 - 5.6 % Final    Comment:             Prediabetes: 5.7 - 6.4          Diabetes: >6.4          Glycemic control for adults with diabetes: <7.0          Passed - Valid encounter within last 6 months    Recent Outpatient Visits          5 months ago Wheezing   [facility], Marolyn Hammock, PA-C   1 year ago Abscess of left groin   [facility], Dema Severin, PA-C   1 year ago Type 2 diabetes mellitus with diabetic neuropathy, with long-term current use of insulin [facility])   [facility] at [facility], [name], MD   1 year ago Type 2 diabetes mellitus with diabetic neuropathy, with long-term current use of insulin [facility])   [facility] at [facility], [name], MD   1 year ago Acute right-sided low back pain with right-sided sciatica   [facility] at [facility], Marolyn Hammock, PA-C

## 2018-06-11 NOTE — Telephone Encounter (Signed)
I called pt and left vm to call office and schedule appointment for refill [facility]

## 2018-07-03 ENCOUNTER — Other Ambulatory Visit: Payer: Self-pay | Admitting: Family Medicine

## 2018-07-30 ENCOUNTER — Other Ambulatory Visit: Payer: Self-pay | Admitting: Family Medicine

## 2018-07-30 NOTE — Telephone Encounter (Signed)
Requested Prescriptions  Pending Prescriptions Disp Refills  . simvastatin (ZOCOR) 20 MG tablet [Pharmacy Med Name: SIMVASTATIN 20MG  TABLETS] 30 tablet 0    Sig: TAKE 1 TABLET BY MOUTH AT BEDTIME     Cardiovascular:  Antilipid - Statins Failed - 07/30/2018  3:34 AM      Failed - Total Cholesterol in normal range and within 360 days    Cholesterol, Total  Date Value Ref Range Status  05/18/2017 164 100 - 199 mg/dL Final         Failed - LDL in normal range and within 360 days    LDL Calculated  Date Value Ref Range Status  05/18/2017 76 0 - 99 mg/dL Final         Failed - HDL in normal range and within 360 days    HDL  Date Value Ref Range Status  05/18/2017 67 >39 mg/dL Final         Failed - Triglycerides in normal range and within 360 days    Triglycerides  Date Value Ref Range Status  05/18/2017 106 0 - 149 mg/dL Final         Passed - Patient is not pregnant      Passed - Valid encounter within last 12 months    Recent Outpatient Visits          7 months ago Wheezing   [facility], Marolyn HammockMichael L, PA-C   1 year ago Abscess of left groin   [facility], Dema SeverinSarah L, PA-C   1 year ago Type 2 diabetes mellitus with diabetic neuropathy, with long-term current use of insulin [facility](HCC)   [facility], Myrle ShengKristi M, MD   1 year ago Type 2 diabetes mellitus with diabetic neuropathy, with long-term current use of insulin [facility])   [facility], Myrle ShengKristi M, MD   1 year ago Acute right-sided low back pain with right-sided sciatica   [facility], Marolyn HammockMichael L, PA-C      Future Appointments            In 3 weeks Sherren MochaShaw, Eva N, MD [facility]         30 day refill until appointment on 08/20/18

## 2018-08-09 ENCOUNTER — Other Ambulatory Visit: Payer: Self-pay | Admitting: Physician Assistant

## 2018-08-09 DIAGNOSIS — Z1231 Encounter for screening mammogram for malignant neoplasm of breast: Secondary | ICD-10-CM

## 2018-08-20 ENCOUNTER — Ambulatory Visit: Payer: BLUE CROSS/BLUE SHIELD | Admitting: Family Medicine

## 2018-08-28 ENCOUNTER — Other Ambulatory Visit: Payer: Self-pay | Admitting: Family Medicine

## 2018-10-27 DIAGNOSIS — E1142 Type 2 diabetes mellitus with diabetic polyneuropathy: Secondary | ICD-10-CM | POA: Insufficient documentation

## 2018-10-27 DIAGNOSIS — E282 Polycystic ovarian syndrome: Secondary | ICD-10-CM | POA: Insufficient documentation

## 2018-10-27 DIAGNOSIS — K219 Gastro-esophageal reflux disease without esophagitis: Secondary | ICD-10-CM | POA: Insufficient documentation

## 2019-03-13 IMAGING — DX DG FINGER THUMB 2+V*L*
3 series · 3 of 3 positions shown · non-contrast
Comparison: None.

CLINICAL DATA: Left thumb pain.  Possible foreign body.

EXAM:
LEFT THUMB 2+V

[finger ap]
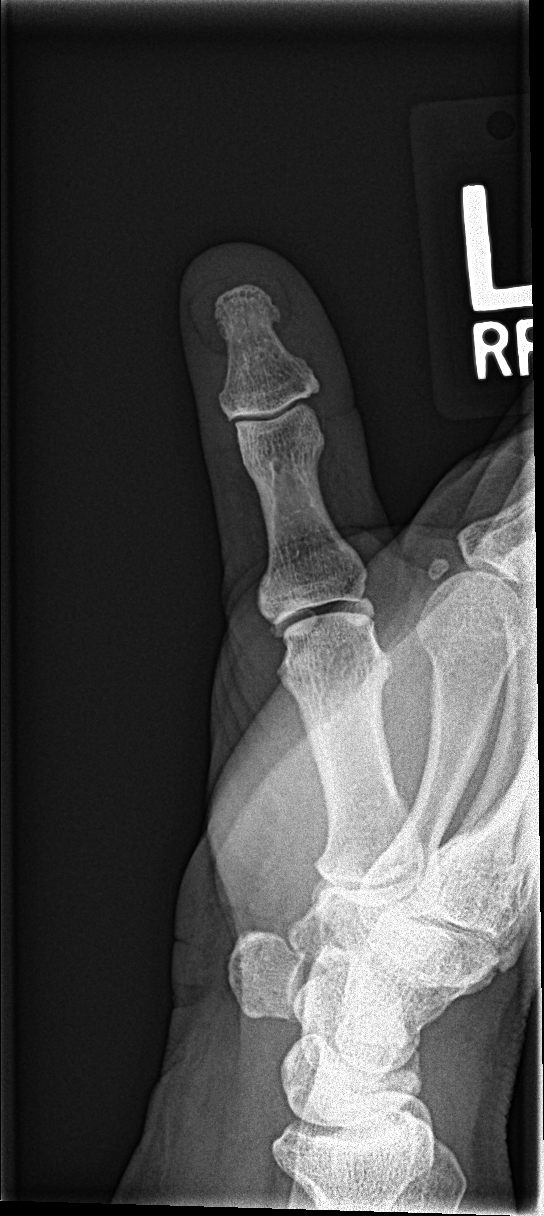

[finger obl]
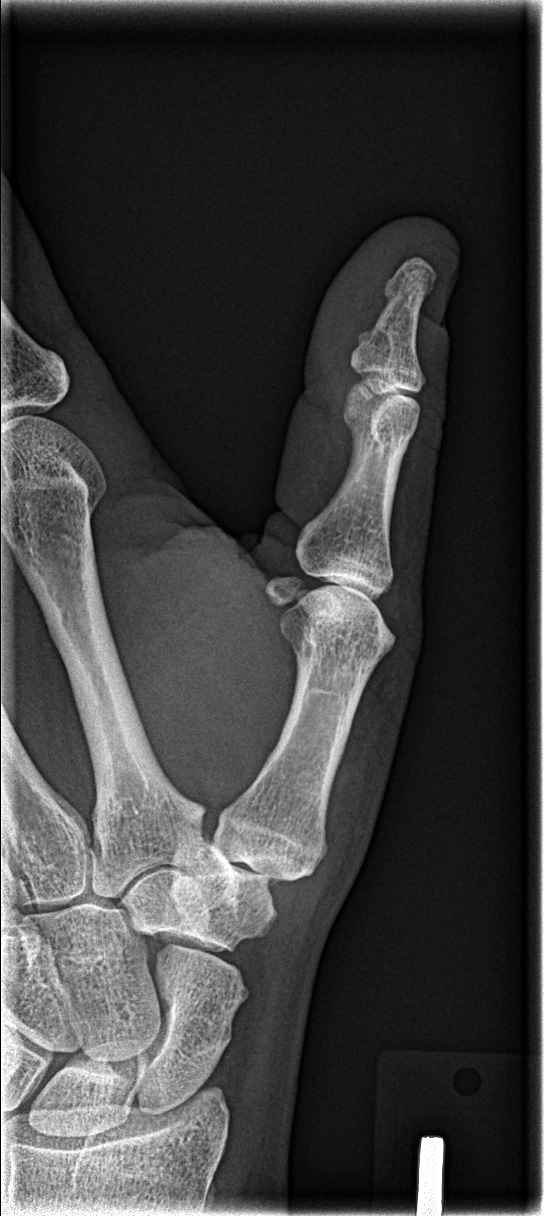

[finger lat]
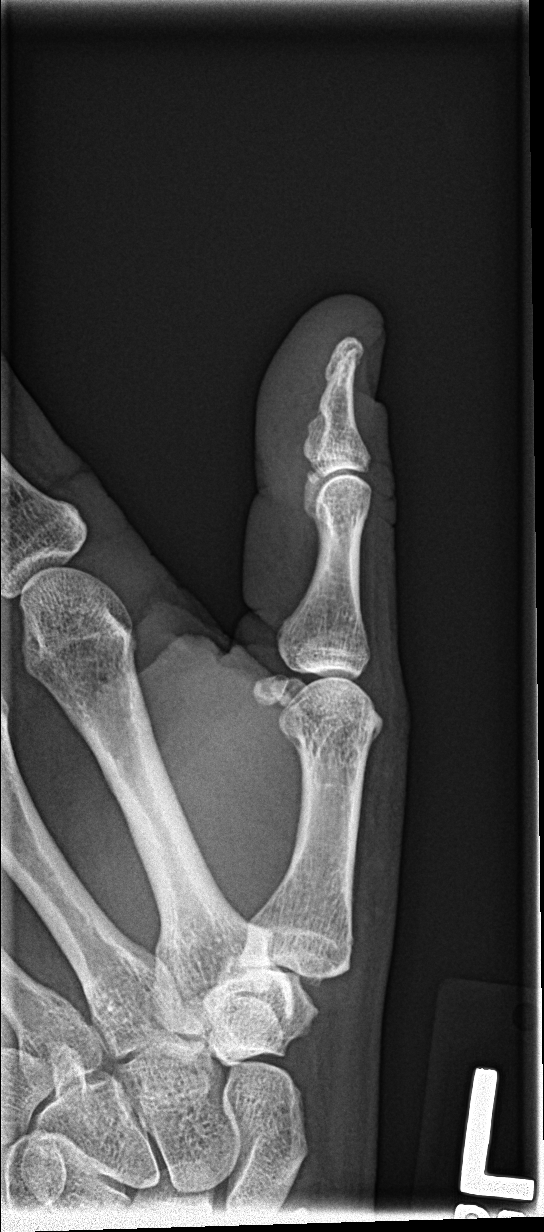

[3 of 3 positions shown; findings below may reference images not displayed]

FINDINGS: Negative for a fracture or dislocation. No evidence for a radiopaque
foreign body. Alignment of the thumb is normal. Soft tissues are
unremarkable. Mild spurring and degenerative changes at the thumb IP
joint.
IMPRESSION: No acute abnormality.  No evidence for a radiopaque foreign body.

## 2019-07-08 DIAGNOSIS — M654 Radial styloid tenosynovitis [de Quervain]: Secondary | ICD-10-CM

## 2019-07-08 DIAGNOSIS — M65312 Trigger thumb, left thumb: Secondary | ICD-10-CM | POA: Insufficient documentation

## 2019-07-08 HISTORY — DX: Radial styloid tenosynovitis (de quervain): M65.4

## 2019-07-08 HISTORY — DX: Trigger thumb, left thumb: M65.312

## 2020-10-31 LAB — COLOGUARD: COLOGUARD: NEGATIVE

## 2021-06-07 DIAGNOSIS — R809 Proteinuria, unspecified: Secondary | ICD-10-CM

## 2021-06-07 HISTORY — DX: Proteinuria, unspecified: R80.9

## 2021-09-24 ENCOUNTER — Other Ambulatory Visit: Payer: Self-pay

## 2021-09-24 ENCOUNTER — Ambulatory Visit (INDEPENDENT_AMBULATORY_CARE_PROVIDER_SITE_OTHER): Payer: 59 | Admitting: Orthopaedic Surgery

## 2021-09-24 ENCOUNTER — Telehealth: Payer: Self-pay

## 2021-09-24 ENCOUNTER — Ambulatory Visit: Payer: Self-pay

## 2021-09-24 ENCOUNTER — Encounter: Payer: Self-pay | Admitting: Orthopaedic Surgery

## 2021-09-24 ENCOUNTER — Ambulatory Visit (INDEPENDENT_AMBULATORY_CARE_PROVIDER_SITE_OTHER): Payer: 59

## 2021-09-24 VITALS — Ht 64.5 in | Wt 318.0 lb

## 2021-09-24 DIAGNOSIS — Z6841 Body Mass Index (BMI) 40.0 and over, adult: Secondary | ICD-10-CM | POA: Diagnosis not present

## 2021-09-24 DIAGNOSIS — M1711 Unilateral primary osteoarthritis, right knee: Secondary | ICD-10-CM

## 2021-09-24 DIAGNOSIS — R634 Abnormal weight loss: Secondary | ICD-10-CM

## 2021-09-24 DIAGNOSIS — M1712 Unilateral primary osteoarthritis, left knee: Secondary | ICD-10-CM | POA: Diagnosis not present

## 2021-09-24 DIAGNOSIS — M170 Bilateral primary osteoarthritis of knee: Secondary | ICD-10-CM

## 2021-09-24 MED ORDER — BUPIVACAINE HCL 0.25 % IJ SOLN
2.0000 mL | INTRAMUSCULAR | Status: AC | PRN
  Administered 2021-09-24: 2 mL via INTRA_ARTICULAR

## 2021-09-24 MED ORDER — LIDOCAINE HCL 1 % IJ SOLN
2.0000 mL | INTRAMUSCULAR | Status: AC | PRN
  Administered 2021-09-24: 2 mL

## 2021-09-24 MED ORDER — METHYLPREDNISOLONE ACETATE 40 MG/ML IJ SUSP
40.0000 mg | INTRAMUSCULAR | Status: AC | PRN
  Administered 2021-09-24: 40 mg via INTRA_ARTICULAR

## 2021-09-24 MED ORDER — ACETAMINOPHEN-CODEINE #3 300-30 MG PO TABS: 1.0000 | ORAL_TABLET | Freq: Three times a day (TID) | ORAL | 2 refills | Status: DC | PRN

## 2021-09-24 NOTE — Progress Notes (Signed)
Office Visit Note   Patient: Alicia Lucero           Date of Birth: 1960-12-03           MRN: [id number] Visit Date: 09/24/2021              Requested by: Ladora Daniel, PA-C [address] [place],  [place] [address] PCP: Pcp, No   Assessment & Plan: Visit Diagnoses:  1. Primary osteoarthritis of both knees   2. Weight loss   3. Body mass index 50.0-59.9, adult (HCC)   4. Morbid obesity (HCC)     Plan: Impression is bilateral knee osteoarthritis left greater than right.  Today, we proceeded with left knee cortisone injection today.  She would also like to get approval for viscosupplementation injection.  We will submit for this.  We have discussed eventual need for total knee arthroplasty and she understands she needs to get to a BMI of under 40.0 in order to proceed with surgery.  This will require her to get to a weight of 230 pounds.  We will make a referral to weight loss clinic for which she is agreeable to.  She will call with concerns or questions in the meantime.  The patient meets the AMA guidelines for Morbid (severe) obesity with a BMI > 40.0 and I have recommended weight loss.  Follow-Up Instructions: Return if symptoms worsen or fail to improve.   Orders:  Orders Placed This Encounter  Procedures   Large Joint Inj: L knee   XR KNEE 3 VIEW LEFT   XR KNEE 3 VIEW RIGHT   Amb Ref to Medical Weight Management   Meds ordered this encounter  Medications   acetaminophen-codeine (TYLENOL #3) 300-30 MG tablet    Sig: Take 1 tablet by mouth every 8 (eight) hours as needed for moderate pain.    Dispense:  30 tablet    Refill:  2      Procedures: Large Joint Inj: L knee on 09/24/2021 10:50 AM Indications: pain Details: 22 G needle, anterolateral approach Medications: 2 mL lidocaine 1 %; 2 mL bupivacaine 0.25 %; 40 mg methylPREDNISolone acetate 40 MG/ML     Clinical Data: No additional findings.   Subjective: Chief Complaint  Patient presents with   Right Knee  - Pain   Left Knee - Pain    HPI patient is a pleasant 61 year old female who comes in today with bilateral knee pain left greater than right.  History of advanced degenerative joint disease.  She has been seen by a local orthopedist where she has been getting intermittent cortisone and viscosupplementation injections with good but temporary relief.  Last injection was cortisone was back in September.  The pain has restarted and is worse going from a seated to standing position as well as when she is sitting for a long period of time.  She has been taking Tylenol without relief.  She is unable to take NSAIDs due to GI upset.  Of note, she does understand that she is in need of total knee arthroplasty, but does not meet the BMI qualifications.  Review of Systems as detailed in HPI.  All others reviewed and are negative.   Objective: Vital Signs: Ht 5' 4.5" (1.638 m)    Wt (!) 318 lb (144.2 kg)    LMP 03/28/2018    BMI 53.74 kg/m   Physical Exam well-developed well-nourished female no acute distress.  Alert and oriented x3.  Ortho Exam bilateral knee exam shows trace  effusion.  Range of motion 0 to 95 degrees.  Medial joint line tenderness.  Moderate patellofemoral crepitus.  She is neurovascular tact distally.  Specialty Comments:  No specialty comments available.  Imaging: XR KNEE 3 VIEW LEFT  Result Date: 09/24/2021 Advanced generative changes medial and patellofemoral compartments  XR KNEE 3 VIEW RIGHT  Result Date: 09/24/2021 Advanced generative changes medial and patellofemoral compartments    PMFS History: Patient Active Problem List   Diagnosis Date Noted   Type 2 diabetes mellitus with retinopathy, with long-term current use of insulin (HCC) 06/01/2017   Vitamin D deficiency 06/01/2017   Bipolar 2 disorder (HCC) 06/01/2017   Class 3 obesity with serious comorbidity and body mass index (BMI) of 50.0 to 59.9 in adult 06/01/2017   Primary osteoarthritis of left knee  09/08/2016   Type 2 diabetes mellitus with diabetic neuropathy, with long-term current use of insulin (HCC) 07/19/2015   Dyslipidemia associated with type 2 diabetes mellitus (HCC) 07/19/2015   Benign essential HTN 07/19/2015   Bipolar 1 disorder (HCC) 03/01/2015   Morbid obesity with BMI of 50.0-59.9, adult (HCC) 12/26/2014   Asthma, chronic 09/21/2013   Past Medical History:  Diagnosis Date   Allergy    Asthma    Back pain    Bipolar 1 disorder (HCC)    Chest pain    Depression    Diabetes mellitus without complication (HCC)    Drug use    Dyspnea    Fatty liver    Gallbladder problem    GERD (gastroesophageal reflux disease)    Hyperlipidemia    Infertility, female    Joint pain    Neuropathy    PCOS (polycystic ovarian syndrome)    PCOS (polycystic ovarian syndrome)    Perimenopausal     Family History  Problem Relation Age of Onset   Cancer Mother 9       lung   Mental illness Mother        bipolar mania   Hypertension Mother    Depression Mother    Cancer Father        liver   Hypertension Father    Heart disease Father 9       CAD/CABG   Multiple sclerosis Sister    Hypertension Sister    Diabetes Sister     Past Surgical History:  Procedure Laterality Date   CHOLECYSTECTOMY  1996   DILATION AND CURETTAGE OF UTERUS     secondary to menorrhagia   KNEE ARTHROSCOPY Left 2008   TONSILLECTOMY     as a child   WISDOM TOOTH EXTRACTION     Social History   Occupational History   Occupation: professional caregiver  Tobacco Use   Smoking status: Former    Types: Cigarettes    Quit date: 06/30/2005    Years since quitting: 16.2   Smokeless tobacco: Never   Tobacco comments:    electronic cigarettes PRN  Vaping Use   Vaping Use: Never used  Substance and Sexual Activity   Alcohol use: No    Alcohol/week: 0.0 standard drinks    Comment: quit: 2007   Drug use: No    Comment: occasional   Sexual activity: Not Currently    Partners: Male     Birth control/protection: None    Comment: 1st intercourse- 16, partners- 10, married- 15 yr s

## 2021-09-24 NOTE — Telephone Encounter (Signed)
Noted.  Will fax to [employer] once I receive the application.

## 2021-09-24 NOTE — Telephone Encounter (Signed)
Please submit for bil gel inj's   FYI patient will fill out [employer] application and will fax back.

## 2021-10-08 ENCOUNTER — Ambulatory Visit: Payer: 59 | Admitting: Orthopaedic Surgery

## 2021-10-09 ENCOUNTER — Other Ambulatory Visit: Payer: Self-pay

## 2021-10-09 ENCOUNTER — Encounter: Payer: Self-pay | Admitting: Orthopaedic Surgery

## 2021-10-09 ENCOUNTER — Ambulatory Visit (INDEPENDENT_AMBULATORY_CARE_PROVIDER_SITE_OTHER): Payer: 59 | Admitting: Orthopaedic Surgery

## 2021-10-09 ENCOUNTER — Ambulatory Visit (INDEPENDENT_AMBULATORY_CARE_PROVIDER_SITE_OTHER): Payer: 59

## 2021-10-09 DIAGNOSIS — M542 Cervicalgia: Secondary | ICD-10-CM

## 2021-10-09 MED ORDER — METHOCARBAMOL 750 MG PO TABS: 750.0000 mg | ORAL_TABLET | Freq: Two times a day (BID) | ORAL | 3 refills | Status: DC | PRN

## 2021-10-09 MED ORDER — PREDNISONE 10 MG (21) PO TBPK: ORAL_TABLET | ORAL | 3 refills | Status: DC

## 2021-10-09 MED ORDER — DICLOFENAC SODIUM 75 MG PO TBEC: 75.0000 mg | DELAYED_RELEASE_TABLET | Freq: Two times a day (BID) | ORAL | 2 refills | Status: DC

## 2021-10-09 MED ORDER — HYDROCODONE-ACETAMINOPHEN 5-325 MG PO TABS: 1.0000 | ORAL_TABLET | Freq: Every day | ORAL | 0 refills | Status: DC | PRN

## 2021-10-09 NOTE — Progress Notes (Signed)
Office Visit Note   Patient: Alicia Lucero           Date of Birth: 26-Jul-1961           MRN: [id number] Visit Date: 10/09/2021              Requested by: No referring provider defined for this encounter. PCP: Pcp, No   Assessment & Plan: Visit Diagnoses:  1. Neck pain     Plan: Impression is cervical radiculopathy.  We reviewed the nature of the condition and treatment options were discussed and reviewed.  Prescriptions were sent in.  Small supply of hydrocodone was prescribed as well.  Follow-up as needed.  Follow-Up Instructions: No follow-ups on file.   Orders:  Orders Placed This Encounter  Procedures   XR Cervical Spine 2 or 3 views   Meds ordered this encounter  Medications   methocarbamol (ROBAXIN) 750 MG tablet    Sig: Take 1 tablet (750 mg total) by mouth 2 (two) times daily as needed for muscle spasms.    Dispense:  20 tablet    Refill:  3   predniSONE (STERAPRED UNI-PAK 21 TAB) 10 MG (21) TBPK tablet    Sig: Take as directed    Dispense:  21 tablet    Refill:  3   diclofenac (VOLTAREN) 75 MG EC tablet    Sig: Take 1 tablet (75 mg total) by mouth 2 (two) times daily.    Dispense:  30 tablet    Refill:  2   HYDROcodone-acetaminophen (NORCO) 5-325 MG tablet    Sig: Take 1-2 tablets by mouth daily as needed.    Dispense:  10 tablet    Refill:  0      Procedures: No procedures performed   Clinical Data: No additional findings.   Subjective: Chief Complaint  Patient presents with   Neck - Pain    HPI  Alicia Lucero is a 61 year old female here for evaluation of of neck and left arm pain.  She started having pain a week ago.  Denies any injuries.  She feels burning numbness and tingling down the entire arm that is worse with rotation of her head.  Tylenol and tramadol do not help.  She has been trying to use heat.  Review of Systems  Constitutional: Negative.   HENT: Negative.    Eyes: Negative.   Respiratory: Negative.    Cardiovascular: Negative.    Endocrine: Negative.   Musculoskeletal: Negative.   Neurological: Negative.   Hematological: Negative.   Psychiatric/Behavioral: Negative.    All other systems reviewed and are negative.   Objective: Vital Signs: LMP 03/28/2018   Physical Exam Vitals and nursing note reviewed.  Constitutional:      Appearance: She is well-developed.  Pulmonary:     Effort: Pulmonary effort is normal.  Skin:    General: Skin is warm.     Capillary Refill: Capillary refill takes less than 2 seconds.  Neurological:     Mental Status: She is alert and oriented to person, place, and time.  Psychiatric:        Behavior: Behavior normal.        Thought Content: Thought content normal.        Judgment: Judgment normal.    Ortho Exam  Examination of cervical spine shows tenderness to palpation positive Spurling sign.  Manual muscle testing of the shoulder is grossly normal.  Specialty Comments:  No specialty comments available.  Imaging: XR Cervical Spine 2 or 3 views  Result Date: 10/09/2021 Mild diffuse degenerative changes.  Straightening of the cervical spine.    PMFS History: Patient Active Problem List   Diagnosis Date Noted   Type 2 diabetes mellitus with retinopathy, with long-term current use of insulin (HCC) 06/01/2017   Vitamin D deficiency 06/01/2017   Bipolar 2 disorder (HCC) 06/01/2017   Class 3 obesity with serious comorbidity and body mass index (BMI) of 50.0 to 59.9 in adult 06/01/2017   Primary osteoarthritis of left knee 09/08/2016   Type 2 diabetes mellitus with diabetic neuropathy, with long-term current use of insulin (HCC) 07/19/2015   Dyslipidemia associated with type 2 diabetes mellitus (HCC) 07/19/2015   Benign essential HTN 07/19/2015   Bipolar 1 disorder (HCC) 03/01/2015   Morbid obesity with BMI of 50.0-59.9, adult (HCC) 12/26/2014   Asthma, chronic 09/21/2013   Past Medical History:  Diagnosis Date   Allergy    Asthma    Back pain    Bipolar 1  disorder (HCC)    Chest pain    Depression    Diabetes mellitus without complication (HCC)    Drug use    Dyspnea    Fatty liver    Gallbladder problem    GERD (gastroesophageal reflux disease)    Hyperlipidemia    Infertility, female    Joint pain    Neuropathy    PCOS (polycystic ovarian syndrome)    PCOS (polycystic ovarian syndrome)    Perimenopausal     Family History  Problem Relation Age of Onset   Cancer Mother 16       lung   Mental illness Mother        bipolar mania   Hypertension Mother    Depression Mother    Cancer Father        liver   Hypertension Father    Heart disease Father 29       CAD/CABG   Multiple sclerosis Sister    Hypertension Sister    Diabetes Sister     Past Surgical History:  Procedure Laterality Date   CHOLECYSTECTOMY  1996   DILATION AND CURETTAGE OF UTERUS     secondary to menorrhagia   KNEE ARTHROSCOPY Left 2008   TONSILLECTOMY     as a child   WISDOM TOOTH EXTRACTION     Social History   Occupational History   Occupation: professional caregiver  Tobacco Use   Smoking status: Former    Types: Cigarettes    Quit date: 06/30/2005    Years since quitting: 16.2   Smokeless tobacco: Never   Tobacco comments:    electronic cigarettes PRN  Vaping Use   Vaping Use: Never used  Substance and Sexual Activity   Alcohol use: No    Alcohol/week: 0.0 standard drinks    Comment: quit: 2007   Drug use: No    Comment: occasional   Sexual activity: Not Currently    Partners: Male    Birth control/protection: None    Comment: 1st intercourse- 16, partners- 10, married- 15 yr s

## 2021-10-31 ENCOUNTER — Ambulatory Visit (HOSPITAL_BASED_OUTPATIENT_CLINIC_OR_DEPARTMENT_OTHER): Payer: Self-pay | Admitting: Nurse Practitioner

## 2021-10-31 DIAGNOSIS — Z800 Family history of malignant neoplasm of digestive organs: Secondary | ICD-10-CM | POA: Insufficient documentation

## 2021-10-31 DIAGNOSIS — M5430 Sciatica, unspecified side: Secondary | ICD-10-CM | POA: Insufficient documentation

## 2021-10-31 DIAGNOSIS — K5730 Diverticulosis of large intestine without perforation or abscess without bleeding: Secondary | ICD-10-CM | POA: Insufficient documentation

## 2021-11-02 ENCOUNTER — Encounter (HOSPITAL_COMMUNITY): Payer: Self-pay | Admitting: *Deleted

## 2021-11-02 ENCOUNTER — Emergency Department (HOSPITAL_COMMUNITY): Payer: 59

## 2021-11-02 ENCOUNTER — Emergency Department (HOSPITAL_COMMUNITY)
Admission: EM | Admit: 2021-11-02 | Discharge: 2021-11-02 | Disposition: A | Discharge status: Home / Self Care | Payer: 59 | Attending: Emergency Medicine | Admitting: Emergency Medicine

## 2021-11-02 ENCOUNTER — Other Ambulatory Visit: Payer: Self-pay

## 2021-11-02 DIAGNOSIS — Z7984 Long term (current) use of oral hypoglycemic drugs: Secondary | ICD-10-CM | POA: Insufficient documentation

## 2021-11-02 DIAGNOSIS — E871 Hypo-osmolality and hyponatremia: Secondary | ICD-10-CM | POA: Diagnosis not present

## 2021-11-02 DIAGNOSIS — D72829 Elevated white blood cell count, unspecified: Secondary | ICD-10-CM | POA: Diagnosis not present

## 2021-11-02 DIAGNOSIS — E876 Hypokalemia: Secondary | ICD-10-CM | POA: Diagnosis not present

## 2021-11-02 DIAGNOSIS — I10 Essential (primary) hypertension: Secondary | ICD-10-CM | POA: Insufficient documentation

## 2021-11-02 DIAGNOSIS — Z79899 Other long term (current) drug therapy: Secondary | ICD-10-CM | POA: Insufficient documentation

## 2021-11-02 DIAGNOSIS — E1165 Type 2 diabetes mellitus with hyperglycemia: Secondary | ICD-10-CM | POA: Insufficient documentation

## 2021-11-02 DIAGNOSIS — K5792 Diverticulitis of intestine, part unspecified, without perforation or abscess without bleeding: Secondary | ICD-10-CM

## 2021-11-02 DIAGNOSIS — J45909 Unspecified asthma, uncomplicated: Secondary | ICD-10-CM | POA: Insufficient documentation

## 2021-11-02 DIAGNOSIS — K5732 Diverticulitis of large intestine without perforation or abscess without bleeding: Secondary | ICD-10-CM | POA: Diagnosis not present

## 2021-11-02 DIAGNOSIS — Z20822 Contact with and (suspected) exposure to covid-19: Secondary | ICD-10-CM | POA: Insufficient documentation

## 2021-11-02 DIAGNOSIS — K529 Noninfective gastroenteritis and colitis, unspecified: Secondary | ICD-10-CM | POA: Diagnosis not present

## 2021-11-02 DIAGNOSIS — E1140 Type 2 diabetes mellitus with diabetic neuropathy, unspecified: Secondary | ICD-10-CM | POA: Diagnosis not present

## 2021-11-02 DIAGNOSIS — R112 Nausea with vomiting, unspecified: Secondary | ICD-10-CM | POA: Diagnosis present

## 2021-11-02 DIAGNOSIS — Z794 Long term (current) use of insulin: Secondary | ICD-10-CM | POA: Diagnosis not present

## 2021-11-02 LAB — COMPREHENSIVE METABOLIC PANEL
ALT: 28 U/L (ref 0–44)
AST: 23 U/L (ref 15–41)
Albumin: 3.4 g/dL — ABNORMAL LOW (ref 3.5–5.0)
Alkaline Phosphatase: 92 U/L (ref 38–126)
Anion gap: 12 (ref 5–15)
BUN: 9 mg/dL (ref 6–20)
CO2: 21 mmol/L — ABNORMAL LOW (ref 22–32)
Calcium: 8.9 mg/dL (ref 8.9–10.3)
Chloride: 97 mmol/L — ABNORMAL LOW (ref 98–111)
Creatinine, Ser: 0.92 mg/dL (ref 0.44–1.00)
GFR, Estimated: >60 mL/min (ref >60)
Glucose, Bld: 377 mg/dL — ABNORMAL HIGH (ref 70–99)
Potassium: 3.2 mmol/L — ABNORMAL LOW (ref 3.5–5.1)
Sodium: 130 mmol/L — ABNORMAL LOW (ref 135–145)
Total Bilirubin: 0.5 mg/dL (ref 0.3–1.2)
Total Protein: 7.5 g/dL (ref 6.5–8.1)

## 2021-11-02 LAB — CBC
HCT: 45.9 % (ref 36.0–46.0)
Hemoglobin: 15.9 g/dL — ABNORMAL HIGH (ref 12.0–15.0)
MCH: 29.2 pg (ref 26.0–34.0)
MCHC: 34.6 g/dL (ref 30.0–36.0)
MCV: 84.4 fL (ref 80.0–100.0)
Platelets: 278 10*3/uL (ref 150–400)
RBC: 5.44 MIL/uL — ABNORMAL HIGH (ref 3.87–5.11)
RDW: 13.5 % (ref 11.5–15.5)
WBC: 10.6 10*3/uL — ABNORMAL HIGH (ref 4.0–10.5)
nRBC: 0 % (ref 0.0–0.2)

## 2021-11-02 LAB — RESP PANEL BY RT-PCR (FLU A&B, COVID) ARPGX2
Influenza A by PCR: NEGATIVE
Influenza B by PCR: NEGATIVE
SARS Coronavirus 2 by RT PCR: NEGATIVE

## 2021-11-02 LAB — HCG, QUANTITATIVE, PREGNANCY: hCG, Beta Chain, Quant, S: 3 m[IU]/mL (ref <5)

## 2021-11-02 LAB — I-STAT BETA HCG BLOOD, ED (MC, WL, AP ONLY): I-stat hCG, quantitative: 5 m[IU]/mL — ABNORMAL HIGH (ref <5)

## 2021-11-02 LAB — LIPASE, BLOOD: Lipase: 28 U/L (ref 11–51)

## 2021-11-02 MED ORDER — POTASSIUM CHLORIDE CRYS ER 20 MEQ PO TBCR
30.0000 meq | EXTENDED_RELEASE_TABLET | Freq: Once | ORAL | Status: AC
  Administered 2021-11-02: 30 meq via ORAL
  Filled 2021-11-02: qty 1

## 2021-11-02 MED ORDER — ONDANSETRON HCL 4 MG/2ML IJ SOLN
4.0000 mg | Freq: Once | INTRAMUSCULAR | Status: AC | PRN
Start: 2021-11-02 — End: 2021-11-02
  Administered 2021-11-02: 4 mg via INTRAVENOUS
  Filled 2021-11-02: qty 2

## 2021-11-02 MED ORDER — MORPHINE SULFATE (PF) 4 MG/ML IV SOLN
4.0000 mg | Freq: Once | INTRAVENOUS | Status: AC
  Administered 2021-11-02: 4 mg via INTRAVENOUS
  Filled 2021-11-02: qty 1

## 2021-11-02 MED ORDER — AMOXICILLIN-POT CLAVULANATE 875-125 MG PO TABS: 1.0000 | ORAL_TABLET | Freq: Two times a day (BID) | ORAL | 0 refills | Status: DC

## 2021-11-02 MED ORDER — GABAPENTIN 300 MG PO CAPS
1000.0000 mg | ORAL_CAPSULE | Freq: Once | ORAL | Status: AC
  Administered 2021-11-02: 1000 mg via ORAL
  Filled 2021-11-02: qty 1

## 2021-11-02 MED ORDER — METFORMIN HCL 500 MG PO TABS: 1000.0000 mg | ORAL_TABLET | Freq: Two times a day (BID) | ORAL | 0 refills | Status: DC

## 2021-11-02 MED ORDER — SODIUM CHLORIDE 0.9 % IV BOLUS
1000.0000 mL | Freq: Once | INTRAVENOUS | Status: AC
  Administered 2021-11-02: 1000 mL via INTRAVENOUS

## 2021-11-02 MED ORDER — ONDANSETRON HCL 4 MG PO TABS: 4.0000 mg | ORAL_TABLET | Freq: Four times a day (QID) | ORAL | 0 refills | Status: DC

## 2021-11-02 MED ORDER — IOHEXOL 300 MG/ML  SOLN
100.0000 mL | Freq: Once | INTRAMUSCULAR | Status: AC | PRN
  Administered 2021-11-02: 100 mL via INTRAVENOUS

## 2021-11-02 NOTE — ED Provider Notes (Signed)
[facility] DEPT Provider Note   CSN: [id number] Arrival date & time: 11/02/21  [id number]     History  Chief Complaint  Patient presents with   Abdominal Pain   Emesis   Nausea   Diarrhea    Alicia Lucero is a 61 y.o. female history significant for morbid obesity, asthma, bipolar, diabetes with diabetic neuropathy, long-term insulin use, hypertension, acid reflux who presents with complaints of nausea, vomiting, diarrhea since Wednesday.  She reports that she feels thirsty, lightheaded, her sugar has been abnormal.  Patient reports that she is missed a few doses of her Toujeo, but otherwise has been taking her medication as normal.  Patient denies any dysuria, does not currently have a menstrual cycle, denies any vaginal discharge, dyspareunia.  She has any chest pain, shortness of breath.  She denies any blood in her diarrhea, emesis.  She reports worsening of her neuropathy pain.  She reports that her stomach pain is worst in the left lower quadrant.  She does have a history of diverticulitis in the past.  She does endorse feeling flushed, chills, denies any recent travel.  Abdominal Pain Associated symptoms: chills, diarrhea, nausea and vomiting   Emesis Associated symptoms: abdominal pain, chills and diarrhea   Diarrhea Associated symptoms: abdominal pain, chills and vomiting       Home Medications Prior to Admission medications   Medication Sig Start Date End Date Taking? Authorizing Provider  amoxicillin-clavulanate (AUGMENTIN) 875-125 MG tablet Take 1 tablet by mouth every 12 (twelve) hours. 11/02/21  Yes Shireen Rayburn H, PA-C  metFORMIN (GLUCOPHAGE) 500 MG tablet Take 2 tablets (1,000 mg total) by mouth 2 (two) times daily with a meal. 11/02/21  Yes Daeja Helderman H, PA-C  ondansetron (ZOFRAN) 4 MG tablet Take 1 tablet (4 mg total) by mouth every 6 (six) hours. 11/02/21  Yes Lee Kuang H, PA-C  ACCU-CHEK AVIVA PLUS test strip USE AS  DIRECTED 05/07/14   Robyn Haber, MD  ACCU-CHEK SOFTCLIX LANCETS lancets USE AS DIRECTED    Georgiann Mccoy M, PA-C  acetaminophen-codeine (TYLENOL #3) 300-30 MG tablet Take 1 tablet by mouth every 8 (eight) hours as needed for moderate pain. 09/24/21   Aundra Dubin, PA-C  albuterol (PROVENTIL) (2.5 MG/3ML) 0.083% nebulizer solution Take 3 mLs (2.5 mg total) by nebulization every 6 (six) hours as needed for wheezing or shortness of breath. 12/25/17   Tereasa Coop, PA-C  albuterol (VENTOLIN HFA) 108 (90 Base) MCG/ACT inhaler inhale 2 puffs by mouth every 4 hours if needed for cough wheezing or shortness of breath 12/25/17   Philis Fendt L, PA-C  B Complex-C (B-COMPLEX WITH VITAMIN C) tablet Take 1 tablet by mouth daily.    [provider]  B-D ULTRAFINE III SHORT PEN 31G X 8 MM MISC use as directed 09/05/16   Harrison Mons, PA  Cholecalciferol (VITAMIN D) 2000 units tablet Take 1 tablet (2,000 Units total) by mouth daily. 06/01/17   Dennard Nip D, MD  clonazePAM (KLONOPIN) 1 MG tablet Take 1 tablet (1 mg total) by mouth 2 (two) times daily as needed. Patient taking differently: Take 1 mg by mouth 3 (three) times daily as needed for anxiety. 04/17/17   Wardell Honour, MD  diclofenac (VOLTAREN) 75 MG EC tablet Take 1 tablet (75 mg total) by mouth 2 (two) times daily. 10/09/21   Leandrew Koyanagi, MD  dicyclomine (BENTYL) 20 MG tablet Take 1 tablet (20 mg total) by mouth 2 (two) times daily. 06/04/18  Palumbo, April, MD  gabapentin (NEURONTIN) 600 MG tablet Take 600 mg by mouth 2 (two) times daily. 04/21/18   [provider]  GLUCOSA-CHONDR-NA CHONDR-MSM PO Take by mouth.    [provider]  HYDROcodone-acetaminophen (NORCO) 5-325 MG tablet Take 1-2 tablets by mouth daily as needed. 10/09/21   Leandrew Koyanagi, MD  HYDROcodone-acetaminophen (NORCO/VICODIN) 5-325 MG tablet Take 1 tablet by mouth every 6 (six) hours as needed for severe pain. 05/03/18   Joy, Shawn C, PA-C   ipratropium-albuterol (DUONEB) 0.5-2.5 (3) MG/3ML SOLN Take 3 mLs by nebulization every 6 (six) hours as needed. 07/28/15   Robyn Haber, MD  ketoconazole (NIZORAL) 2 % cream Apply 1 application topically 2 (two) times daily. 07/19/15   Robyn Haber, MD  lamoTRIgine (LAMICTAL) 200 MG tablet Take 2 tablets (400 mg total) by mouth every evening. 02/11/16   Wardell Honour, MD  liraglutide (VICTOZA) 18 MG/3ML SOPN Inject 0.1 mLs (0.6 mg total) into the skin every morning. Patient taking differently: Inject 1.8 mg into the skin at bedtime. 06/01/17   Dennard Nip D, MD  lisdexamfetamine (VYVANSE) 60 MG capsule Take 60 mg by mouth daily.     [provider]  lisinopril (PRINIVIL,ZESTRIL) 5 MG tablet take 1 tablet by mouth once daily 09/22/17   Wardell Honour, MD  methocarbamol (ROBAXIN) 750 MG tablet Take 1 tablet (750 mg total) by mouth 2 (two) times daily as needed for muscle spasms. 10/09/21   Leandrew Koyanagi, MD  Multiple Vitamins-Minerals (WOMENS MULTIVITAMIN PLUS PO) Take 1 tablet by mouth daily.     [provider]  mupirocin ointment (BACTROBAN) 2 % apply to affected area twice a day 12/10/16   Wardell Honour, MD  nitrofurantoin, macrocrystal-monohydrate, (MACROBID) 100 MG capsule Take 1 capsule (100 mg total) by mouth 2 (two) times daily. X 7 days 06/04/18   Randal Buba, April, MD  [facility] powder apply topically four times a day 12/10/16   Wardell Honour, MD  nystatin cream (MYCOSTATIN) Apply 1 application topically 2 (two) times daily. 08/27/16   Wardell Honour, MD  Omega-3 Fatty Acids (FISH OIL PO) Take 2 capsules by mouth 2 (two) times daily.    [provider]  omeprazole (PRILOSEC) 20 MG capsule Take 20 mg by mouth daily. 04/26/18   [provider]  ondansetron (ZOFRAN ODT) 4 MG disintegrating tablet 4mg  ODT q8 hours prn nausea/vomit 06/04/18   Palumbo, April, MD  ondansetron (ZOFRAN-ODT) 8 MG disintegrating tablet Take 8 mg by mouth 2 (two) times daily as  needed for nausea or vomiting.  02/23/18   [provider]  oxybutynin (DITROPAN) 5 MG tablet Take 1 tablet (5 mg total) by mouth 3 (three) times daily as needed for bladder spasms. 05/03/18   Joy, Shawn C, PA-C  predniSONE (STERAPRED UNI-PAK 21 TAB) 10 MG (21) TBPK tablet Take as directed 10/09/21   Leandrew Koyanagi, MD  promethazine (PHENERGAN) 12.5 MG suppository Place 1 suppository (12.5 mg total) rectally every 8 (eight) hours as needed for nausea or vomiting. 04/16/18   Princess Bruins, MD  ranitidine (ZANTAC) 150 MG capsule TAKE ONE CAPSULE BY MOUTH TWICE DAILY 02/18/18   Wardell Honour, MD  sertraline (ZOLOFT) 100 MG tablet Take 2 tablets (200 mg total) by mouth daily. 02/11/16   Wardell Honour, MD  simvastatin (ZOCOR) 20 MG tablet TAKE 1 TABLET BY MOUTH AT BEDTIME 07/30/18   Shawnee Knapp, MD  TOUJEO SOLOSTAR 300 UNIT/ML SOPN INJECT 50  UNITS SUBCURANEOUSLY DAILY Patient taking differently: Inject 50 Units into the skin daily. 04/16/18   Jacelyn Pi, Lilia Argue, MD  TRINTELLIX 5 MG TABS tablet Take 15 mg by mouth daily.  12/08/17   [provider]      Allergies    Empagliflozin, Empagliflozin-linagliptin, Other, and Oxycodone    Review of Systems   Review of Systems  Constitutional:  Positive for chills.  Gastrointestinal:  Positive for abdominal pain, diarrhea, nausea and vomiting.  All other systems reviewed and are negative.  Physical Exam Updated Vital Signs BP 135/80    Pulse 85    Temp 98.6 F (37 C) (Oral)    Resp 14    Ht 5' 4.5" (1.638 m)    Wt (!) 145.2 kg    LMP 03/28/2018    SpO2 91%    BMI 54.08 kg/m  Physical Exam Vitals and nursing note reviewed.  Constitutional:      General: She is not in acute distress.    Appearance: Normal appearance. She is obese.     Comments: patient is flushed, somewhat ill-appearing  HENT:     Head: Normocephalic and atraumatic.  Eyes:     General:        Right eye: No discharge.        Left eye: No discharge.   Cardiovascular:     Rate and Rhythm: Normal rate and regular rhythm.     Heart sounds: No murmur heard.   No friction rub. No gallop.  Pulmonary:     Effort: Pulmonary effort is normal.     Breath sounds: Normal breath sounds.  Abdominal:     General: Bowel sounds are normal.     Palpations: Abdomen is soft.     Comments: Tenderness to palpation predominantly in the left lower quadrant, normal bowel sounds throughout.  No rebound, rigidity, guarding.  Skin:    General: Skin is warm and dry.     Capillary Refill: Capillary refill takes less than 2 seconds.  Neurological:     Mental Status: She is alert and oriented to person, place, and time.  Psychiatric:        Mood and Affect: Mood normal.        Behavior: Behavior normal.    ED Results / Procedures / Treatments   Labs (all labs ordered are listed, but only abnormal results are displayed) Labs Reviewed  COMPREHENSIVE METABOLIC PANEL - Abnormal; Notable for the following components:      Result Value   Sodium 130 (*)    Potassium 3.2 (*)    Chloride 97 (*)    CO2 21 (*)    Glucose, Bld 377 (*)    Albumin 3.4 (*)    All other components within normal limits  CBC - Abnormal; Notable for the following components:   WBC 10.6 (*)    RBC 5.44 (*)    Hemoglobin 15.9 (*)    All other components within normal limits  I-STAT BETA HCG BLOOD, ED ([facility], [facility], [facility] ONLY) - Abnormal; Notable for the following components:   I-stat hCG, quantitative 5.0 (*)    All other components within normal limits  RESP PANEL BY RT-PCR (FLU A&B, COVID) ARPGX2  LIPASE, BLOOD  HCG, QUANTITATIVE, PREGNANCY    EKG EKG Interpretation  Date/Time:  Saturday November 02 2021 07:57:44 EST Ventricular Rate:  89 PR Interval:  166 QRS Duration: 90 QT Interval:  365 QTC Calculation: 445 R Axis:   257 Text Interpretation: Sinus  rhythm Inferior infarct, old Baseline wander in lead(s) III aVF No significant change since last tracing Confirmed by Blanchie Dessert [phone]) on 11/02/2021 9:16:26 AM  Radiology CT ABDOMEN PELVIS W CONTRAST  Result Date: 11/02/2021 CLINICAL DATA:  Left lower quadrant pain for 4 days.  Vomiting. EXAM: CT ABDOMEN AND PELVIS WITH CONTRAST TECHNIQUE: Multidetector CT imaging of the abdomen and pelvis was performed using the standard protocol following bolus administration of intravenous contrast. RADIATION DOSE REDUCTION: This exam was performed according to the departmental dose-optimization program which includes automated exposure control, adjustment of the mA and/or kV according to patient size and/or use of iterative reconstruction technique. CONTRAST:  123mL OMNIPAQUE IOHEXOL 300 MG/ML  SOLN COMPARISON:  Noncontrast CT on 06/04/2018 FINDINGS: Lower Chest: No acute findings. Hepatobiliary: No hepatic masses identified. Mild diffuse hepatic steatosis noted. Prior cholecystectomy. No evidence of biliary obstruction. Pancreas:  No mass or inflammatory changes. Spleen: Within normal limits in size and appearance. Adrenals/Urinary Tract: No masses identified. 5 mm calculus is seen in the left renal sinus. No evidence of ureteral calculi or hydronephrosis. Stomach/Bowel: Small hiatal hernia is seen. Diverticulosis is seen mainly involving the descending and sigmoid colon, however there is no evidence of diverticulitis. Image degradation by motion artifact noted, and bowel evaluation is also limited by lack of oral contrast. There appears to be mild thickening of the right colon with mild pericolonic soft tissue stranding, consistent with right-sided colitis or diverticulitis. No evidence of abscess or bowel obstruction. Vascular/Lymphatic: No pathologically enlarged lymph nodes. No acute vascular findings. Reproductive:  No mass or other significant abnormality. Other:  None. Musculoskeletal:  No suspicious bone lesions identified. IMPRESSION: Suboptimal evaluation due to motion artifact and lack of oral contrast. Suspect mild right-sided  colitis or diverticulitis. No evidence of abscess or other complication. Left-sided diverticulosis, without definite acute diverticulitis in this region. Small hiatal hernia. Mild hepatic steatosis. 5 mm nonobstructing left renal calculus. Electronically Signed   By: Marlaine Hind M.D.   On: 11/02/2021 10:55    Procedures Procedures    Medications Ordered in ED Medications  ondansetron (ZOFRAN) injection 4 mg (4 mg Intravenous Given 11/02/21 0812)  sodium chloride 0.9 % bolus 1,000 mL (0 mLs Intravenous Stopped 11/02/21 0946)  gabapentin (NEURONTIN) capsule 1,000 mg (1,000 mg Oral Given 11/02/21 0832)  morphine (PF) 4 MG/ML injection 4 mg (4 mg Intravenous Given 11/02/21 0833)  potassium chloride (KLOR-CON M) CR tablet 30 mEq (30 mEq Oral Given 11/02/21 0932)  iohexol (OMNIPAQUE) 300 MG/ML solution 100 mL (100 mLs Intravenous Contrast Given 11/02/21 1028)    ED Course/ Medical Decision Making/ A&P                           Medical Decision Making Amount and/or Complexity of Data Reviewed Labs: ordered. Radiology: ordered.  Risk Prescription drug management.   I discussed this case with my attending physician who cosigned this note including patient's presenting symptoms, physical exam, and planned diagnostics and interventions. Attending physician stated agreement with plan or made changes to plan which were implemented.   This patient presents to the ED for concern of nausea, vomiting, diarrhea, overall malaise, left lower quadrant abdominal pain, this involves an extensive number of treatment options, and is a complaint that carries with it a high risk of complications and morbidity. The emergent differential diagnosis prior to evaluation includes, but is not limited to, acute diverticulitis, mesenteric ischemia, gastroenteritis, COVID, flu, C. difficile, or other GI  viral pathogen, additional concern for UTI, pyelonephritis, nephrolithiasis, versus other, this is not an exhaustive  differential.   Past Medical History / Co-morbidities: Morbid obesity, diabetes with peripheral neuropathy, insulin use, hypertension, PCOS  Additional history: External records from outside source obtained and reviewed including recent family medicine, orthopedic, endocrinology visits.  Physical Exam: Physical exam performed. The pertinent findings include: Diffuse left lower quadrant tenderness, overall well-appearing, somewhat dehydrated with dry mucous membranes.  Afebrile, but somewhat flushed.  Lab Tests: I ordered, and personally interpreted labs.  The pertinent results include: Pseudohyponatremia, 130 with glucose of 377, corrected 137 by [facility].  Potassium 3.2, repleted orally.  Very minimally decreased bicarb 21.  No anion gap noted.  CBC with mild leukocytosis of 10.6.  Negative lipase, beta-hCG, RVP for COVID, flu.   Imaging Studies: I ordered imaging studies including CT abdomen pelvis with contrast. I independently visualized and interpreted imaging which showed equivocal findings consistent with early acute diverticulitis versus colitis. I agree with the radiologist interpretation.   Cardiac Monitoring:  The patient was maintained on a cardiac monitor.  My attending physician Dr. Maryan Rued viewed and interpreted the cardiac monitored which showed an underlying rhythm of: Normal sinus rhythm, possible old inferior infarct   Medications: I ordered medication including morphine, gabapentin for pain, neuropathy, potassium chloride for hypokalemia, Zofran for nausea. Reevaluation of the patient after these medicines showed that the patient improved. I have reviewed the patients home medicines and have made adjustments as needed.  Disposition: Patient's symptoms and work-up are consistent with early acute diverticulitis versus colitis.  Given that she has had symptoms ongoing for 4 to 5 days, as well as frequent diarrhea believe it would be reasonable to treat her with Augmentin,  full liquid diet, plenty of fluids, rest.  Encourage follow-up with PCP.  We will discharge with Augmentin, refill of patient's metformin, and Zofran for nausea.  Encouraged ibuprofen, Tylenol for pain.  Patient discharged in stable condition at this time, extensive return precautions given.  Final Clinical Impression(s) / ED Diagnoses Final diagnoses:  Diverticulitis  Colitis    Rx / DC Orders ED Discharge Orders          Ordered    amoxicillin-clavulanate (AUGMENTIN) 875-125 MG tablet  Every 12 hours        11/02/21 1228    ondansetron (ZOFRAN) 4 MG tablet  Every 6 hours        11/02/21 1228    metFORMIN (GLUCOPHAGE) 500 MG tablet  2 times daily with meals        11/02/21 1228              Athalene Kolle, [place] H, PA-C 11/02/21 1238    Blanchie Dessert, MD 11/02/21 1442

## 2021-11-02 NOTE — Discharge Instructions (Addendum)
As we discussed your CT showed evidence of early acute diverticulitis versus colitis.  As your symptoms have been going on for several days I think it is wise to go ahead and treat with antibiotics, as well begin the full liquid diet that I prescribed in the handout I attached your paperwork.  I would stay on the full liquid diet for at least 5 to 7 days.  Follow-up with your PCP when you are able to see her.  Please return to the emergency department if your pain, symptoms worsens.  If you are having diarrhea more than twice an hour you may try some over-the-counter Imodium.  Otherwise you can use some ibuprofen, Tylenol for pain.  I recommend that you take a probiotic with the antibiotics that I am prescribing you.

## 2021-11-02 NOTE — ED Notes (Signed)
To CT

## 2021-11-02 NOTE — ED Triage Notes (Signed)
BIB EMS due to abd pain N/V/D since Wednesday. C/o dizziness with standing, CBG 334 140/95- 97 96% RA

## 2021-11-15 ENCOUNTER — Encounter (HOSPITAL_BASED_OUTPATIENT_CLINIC_OR_DEPARTMENT_OTHER): Payer: Self-pay | Admitting: Nurse Practitioner

## 2021-11-18 ENCOUNTER — Encounter (HOSPITAL_BASED_OUTPATIENT_CLINIC_OR_DEPARTMENT_OTHER): Payer: Self-pay | Admitting: Emergency Medicine

## 2021-11-18 ENCOUNTER — Other Ambulatory Visit: Payer: Self-pay

## 2021-11-18 ENCOUNTER — Emergency Department (HOSPITAL_BASED_OUTPATIENT_CLINIC_OR_DEPARTMENT_OTHER): Payer: 59

## 2021-11-18 ENCOUNTER — Other Ambulatory Visit (HOSPITAL_BASED_OUTPATIENT_CLINIC_OR_DEPARTMENT_OTHER): Payer: Self-pay

## 2021-11-18 ENCOUNTER — Emergency Department (HOSPITAL_BASED_OUTPATIENT_CLINIC_OR_DEPARTMENT_OTHER)
Admission: EM | Admit: 2021-11-18 | Discharge: 2021-11-18 | Disposition: A | Discharge status: Home / Self Care | Payer: 59 | Attending: Emergency Medicine | Admitting: Emergency Medicine

## 2021-11-18 DIAGNOSIS — R109 Unspecified abdominal pain: Secondary | ICD-10-CM | POA: Insufficient documentation

## 2021-11-18 LAB — URINALYSIS, ROUTINE W REFLEX MICROSCOPIC
Bilirubin Urine: NEGATIVE
Glucose, UA: NEGATIVE mg/dL
Hgb urine dipstick: NEGATIVE
Ketones, ur: NEGATIVE mg/dL
Leukocytes,Ua: NEGATIVE
Nitrite: NEGATIVE
Specific Gravity, Urine: >1.046 — ABNORMAL HIGH (ref 1.005–1.030)
pH: 5.5 (ref 5.0–8.0)

## 2021-11-18 LAB — COMPREHENSIVE METABOLIC PANEL
ALT: 19 U/L (ref 0–44)
AST: 18 U/L (ref 15–41)
Albumin: 4.2 g/dL (ref 3.5–5.0)
Alkaline Phosphatase: 78 U/L (ref 38–126)
Anion gap: 12 (ref 5–15)
BUN: 12 mg/dL (ref 6–20)
CO2: 23 mmol/L (ref 22–32)
Calcium: 9.6 mg/dL (ref 8.9–10.3)
Chloride: 99 mmol/L (ref 98–111)
Creatinine, Ser: 0.92 mg/dL (ref 0.44–1.00)
GFR, Estimated: >60 mL/min (ref >60)
Glucose, Bld: 262 mg/dL — ABNORMAL HIGH (ref 70–99)
Potassium: 4.2 mmol/L (ref 3.5–5.1)
Sodium: 134 mmol/L — ABNORMAL LOW (ref 135–145)
Total Bilirubin: 0.5 mg/dL (ref 0.3–1.2)
Total Protein: 7.7 g/dL (ref 6.5–8.1)

## 2021-11-18 LAB — CBC
HCT: 42.4 % (ref 36.0–46.0)
Hemoglobin: 13.9 g/dL (ref 12.0–15.0)
MCH: 28.1 pg (ref 26.0–34.0)
MCHC: 32.8 g/dL (ref 30.0–36.0)
MCV: 85.8 fL (ref 80.0–100.0)
Platelets: 369 10*3/uL (ref 150–400)
RBC: 4.94 MIL/uL (ref 3.87–5.11)
RDW: 14.1 % (ref 11.5–15.5)
WBC: 12.1 10*3/uL — ABNORMAL HIGH (ref 4.0–10.5)
nRBC: 0 % (ref 0.0–0.2)

## 2021-11-18 LAB — PREGNANCY, URINE: Preg Test, Ur: NEGATIVE

## 2021-11-18 LAB — LIPASE, BLOOD: Lipase: 24 U/L (ref 11–51)

## 2021-11-18 MED ORDER — SODIUM CHLORIDE 0.9 % IV BOLUS
1000.0000 mL | Freq: Once | INTRAVENOUS | Status: AC
  Administered 2021-11-18: 1000 mL via INTRAVENOUS

## 2021-11-18 MED ORDER — ONDANSETRON 4 MG PO TBDP
4.0000 mg | ORAL_TABLET | Freq: Three times a day (TID) | ORAL | 0 refills | Status: DC | PRN
  Filled 2021-11-18: qty 10, 4d supply, fill #0

## 2021-11-18 MED ORDER — ONDANSETRON HCL 4 MG/2ML IJ SOLN
4.0000 mg | Freq: Once | INTRAMUSCULAR | Status: AC
  Administered 2021-11-18: 4 mg via INTRAVENOUS
  Filled 2021-11-18: qty 2

## 2021-11-18 MED ORDER — IOHEXOL 300 MG/ML  SOLN
100.0000 mL | Freq: Once | INTRAMUSCULAR | Status: AC | PRN
  Administered 2021-11-18: 100 mL via INTRAVENOUS

## 2021-11-18 MED ORDER — MORPHINE SULFATE (PF) 4 MG/ML IV SOLN
4.0000 mg | Freq: Once | INTRAVENOUS | Status: AC
  Administered 2021-11-18: 4 mg via INTRAVENOUS
  Filled 2021-11-18: qty 1

## 2021-11-18 MED ORDER — METFORMIN HCL 500 MG PO TABS
1000.0000 mg | ORAL_TABLET | Freq: Two times a day (BID) | ORAL | 0 refills | Status: DC
  Filled 2021-11-18: qty 120, 30d supply, fill #0

## 2021-11-18 NOTE — Discharge Instructions (Addendum)
If you develop worsening, continued, or recurrent abdominal pain, uncontrolled vomiting, fever, chest or back pain, or any other new/concerning symptoms then return to the ER for evaluation.  

## 2021-11-18 NOTE — ED Provider Notes (Signed)
[facility] EMERGENCY DEPT Provider Note   CSN: [id number] Arrival date & time: 11/18/21  1147     History  Chief Complaint  Patient presents with   Abdominal Pain    Alicia Lucero is a 61 y.o. female.  Patient presents with left-sided abdominal pain.  Described as sharp and aching and persistent.  Had some improvement last week but returned again within 3 to 4 days.  No associated fevers positive vomiting or diarrhea.  Patient says she was recently diagnosed with diverticulitis 2 weeks ago.  She finished a course of antibiotics and had some improvement but the pain has not returned.  No recorded fevers at home.      Home Medications Prior to Admission medications   Medication Sig Start Date End Date Taking? Authorizing Provider  ACCU-CHEK AVIVA PLUS test strip USE AS DIRECTED 05/07/14   [name], MD  ACCU-CHEK SOFTCLIX LANCETS lancets USE AS DIRECTED    Rhoderick Moody M, PA-C  acetaminophen-codeine (TYLENOL #3) 300-30 MG tablet Take 1 tablet by mouth every 8 (eight) hours as needed for moderate pain. 09/24/21   Cristie Hem, PA-C  albuterol (PROVENTIL) (2.5 MG/3ML) 0.083% nebulizer solution Take 3 mLs (2.5 mg total) by nebulization every 6 (six) hours as needed for wheezing or shortness of breath. 12/25/17   Ofilia Neas, PA-C  albuterol (VENTOLIN HFA) 108 (90 Base) MCG/ACT inhaler inhale 2 puffs by mouth every 4 hours if needed for cough wheezing or shortness of breath 12/25/17   Ofilia Neas, PA-C  amoxicillin-clavulanate (AUGMENTIN) 875-125 MG tablet Take 1 tablet by mouth every 12 (twelve) hours. 11/02/21   Prosperi, Christian H, PA-C  B Complex-C (B-COMPLEX WITH VITAMIN C) tablet Take 1 tablet by mouth daily.    [provider]  B-D ULTRAFINE III SHORT PEN 31G X 8 MM MISC use as directed 09/05/16   Porfirio Oar, PA  Cholecalciferol (VITAMIN D) 2000 units tablet Take 1 tablet (2,000 Units total) by mouth daily. 06/01/17   Quillian Quince D,  MD  clonazePAM (KLONOPIN) 1 MG tablet Take 1 tablet (1 mg total) by mouth 2 (two) times daily as needed. Patient taking differently: Take 1 mg by mouth 3 (three) times daily as needed for anxiety. 04/17/17   Ethelda Chick, MD  diclofenac (VOLTAREN) 75 MG EC tablet Take 1 tablet (75 mg total) by mouth 2 (two) times daily. 10/09/21   Tarry Kos, MD  dicyclomine (BENTYL) 20 MG tablet Take 1 tablet (20 mg total) by mouth 2 (two) times daily. 06/04/18   Palumbo, April, MD  gabapentin (NEURONTIN) 600 MG tablet Take 600 mg by mouth 2 (two) times daily. 04/21/18   [provider]  GLUCOSA-CHONDR-NA CHONDR-MSM PO Take by mouth.    [provider]  HYDROcodone-acetaminophen (NORCO) 5-325 MG tablet Take 1-2 tablets by mouth daily as needed. 10/09/21   Tarry Kos, MD  HYDROcodone-acetaminophen (NORCO/VICODIN) 5-325 MG tablet Take 1 tablet by mouth every 6 (six) hours as needed for severe pain. 05/03/18   Joy, Shawn C, PA-C  ipratropium-albuterol (DUONEB) 0.5-2.5 (3) MG/3ML SOLN Take 3 mLs by nebulization every 6 (six) hours as needed. 07/28/15   [name], MD  ketoconazole (NIZORAL) 2 % cream Apply 1 application topically 2 (two) times daily. 07/19/15   [name], MD  lamoTRIgine (LAMICTAL) 200 MG tablet Take 2 tablets (400 mg total) by mouth every evening. 02/11/16   Ethelda Chick, MD  liraglutide (VICTOZA) 18 MG/3ML SOPN Inject 0.1 mLs (  0.6 mg total) into the skin every morning. Patient taking differently: Inject 1.8 mg into the skin at bedtime. 06/01/17   Quillian QuinceBeasley, Caren D, MD  lisdexamfetamine (VYVANSE) 60 MG capsule Take 60 mg by mouth daily.     [provider]  lisinopril (PRINIVIL,ZESTRIL) 5 MG tablet take 1 tablet by mouth once daily 09/22/17   Ethelda ChickSmith, Kristi M, MD  metFORMIN (GLUCOPHAGE) 500 MG tablet Take 2 tablets (1,000 mg total) by mouth 2 (two) times daily with a meal. 11/02/21   Prosperi, Christian H, PA-C  methocarbamol (ROBAXIN) 750 MG tablet Take 1 tablet  (750 mg total) by mouth 2 (two) times daily as needed for muscle spasms. 10/09/21   Tarry KosXu, Naiping M, MD  Multiple Vitamins-Minerals (WOMENS MULTIVITAMIN PLUS PO) Take 1 tablet by mouth daily.     [provider]  mupirocin ointment (BACTROBAN) 2 % apply to affected area twice a day 12/10/16   Ethelda ChickSmith, Kristi M, MD  nitrofurantoin, macrocrystal-monohydrate, (MACROBID) 100 MG capsule Take 1 capsule (100 mg total) by mouth 2 (two) times daily. X 7 days 06/04/18   Nicanor AlconPalumbo, April, MD  [facility] powder apply topically four times a day 12/10/16   Ethelda ChickSmith, Kristi M, MD  nystatin cream (MYCOSTATIN) Apply 1 application topically 2 (two) times daily. 08/27/16   Ethelda ChickSmith, Kristi M, MD  Omega-3 Fatty Acids (FISH OIL PO) Take 2 capsules by mouth 2 (two) times daily.    [provider]  omeprazole (PRILOSEC) 20 MG capsule Take 20 mg by mouth daily. 04/26/18   [provider]  ondansetron (ZOFRAN ODT) 4 MG disintegrating tablet 4mg  ODT q8 hours prn nausea/vomit 06/04/18   Palumbo, April, MD  ondansetron (ZOFRAN) 4 MG tablet Take 1 tablet (4 mg total) by mouth every 6 (six) hours. 11/02/21   Prosperi, Christian H, PA-C  ondansetron (ZOFRAN-ODT) 8 MG disintegrating tablet Take 8 mg by mouth 2 (two) times daily as needed for nausea or vomiting.  02/23/18   [provider]  oxybutynin (DITROPAN) 5 MG tablet Take 1 tablet (5 mg total) by mouth 3 (three) times daily as needed for bladder spasms. 05/03/18   Joy, Shawn C, PA-C  predniSONE (STERAPRED UNI-PAK 21 TAB) 10 MG (21) TBPK tablet Take as directed 10/09/21   Tarry KosXu, Naiping M, MD  promethazine (PHENERGAN) 12.5 MG suppository Place 1 suppository (12.5 mg total) rectally every 8 (eight) hours as needed for nausea or vomiting. 04/16/18   Genia DelLavoie, Marie-Lyne, MD  ranitidine (ZANTAC) 150 MG capsule TAKE ONE CAPSULE BY MOUTH TWICE DAILY 02/18/18   Ethelda ChickSmith, Kristi M, MD  sertraline (ZOLOFT) 100 MG tablet Take 2 tablets (200 mg total) by mouth daily. 02/11/16   Ethelda ChickSmith, Kristi M,  MD  simvastatin (ZOCOR) 20 MG tablet TAKE 1 TABLET BY MOUTH AT BEDTIME 07/30/18   Sherren MochaShaw, Eva N, MD  TOUJEO SOLOSTAR 300 UNIT/ML SOPN INJECT 50 UNITS SUBCURANEOUSLY DAILY Patient taking differently: Inject 50 Units into the skin daily. 04/16/18   Lezlie LyeSantiago Lago, Meda CoffeeIrma M, MD  TRINTELLIX 5 MG TABS tablet Take 15 mg by mouth daily.  12/08/17   [provider]      Allergies    Empagliflozin, Empagliflozin-linagliptin, Other, and Oxycodone    Review of Systems   Review of Systems  Constitutional:  Negative for fever.  HENT:  Negative for ear pain.   Eyes:  Negative for pain.  Respiratory:  Negative for cough.   Cardiovascular:  Negative for chest pain.  Gastrointestinal:  Positive for abdominal pain.  Genitourinary:  Negative for flank pain.  Musculoskeletal:  Negative for back pain.  Skin:  Negative for rash.  Neurological:  Negative for headaches.   Physical Exam Updated Vital Signs BP 106/69    Pulse 93    Temp 98.2 F (36.8 C) (Temporal)    Resp 18    Ht 5\' 4"  (1.626 m)    Wt 136 kg    LMP 03/28/2018    SpO2 92%    BMI 51.48 kg/m  Physical Exam Constitutional:      General: She is not in acute distress.    Appearance: Normal appearance.  HENT:     Head: Normocephalic.     Nose: Nose normal.  Eyes:     Extraocular Movements: Extraocular movements intact.  Cardiovascular:     Rate and Rhythm: Normal rate.  Pulmonary:     Effort: Pulmonary effort is normal.  Abdominal:     Tenderness: There is abdominal tenderness.     Comments: Left-sided abdominal tenderness on exam no guarding or rebound.  Musculoskeletal:        General: Normal range of motion.     Cervical back: Normal range of motion.  Neurological:     General: No focal deficit present.     Mental Status: She is alert. Mental status is at baseline.    ED Results / Procedures / Treatments   Labs (all labs ordered are listed, but only abnormal results are displayed) Labs Reviewed  CBC - Abnormal; Notable for  the following components:      Result Value   WBC 12.1 (*)    All other components within normal limits  COMPREHENSIVE METABOLIC PANEL - Abnormal; Notable for the following components:   Sodium 134 (*)    Glucose, Bld 262 (*)    All other components within normal limits  LIPASE, BLOOD  URINALYSIS, ROUTINE W REFLEX MICROSCOPIC  PREGNANCY, URINE    EKG None  Radiology CT Abdomen Pelvis W Contrast  Result Date: 11/18/2021 CLINICAL DATA:  Abdominal pain. Vomiting and diarrhea. Mainly in the upper and lower left quadrants. EXAM: CT ABDOMEN AND PELVIS WITH CONTRAST TECHNIQUE: Multidetector CT imaging of the abdomen and pelvis was performed using the standard protocol following bolus administration of intravenous contrast. RADIATION DOSE REDUCTION: This exam was performed according to the departmental dose-optimization program which includes automated exposure control, adjustment of the mA and/or kV according to patient size and/or use of iterative reconstruction technique. CONTRAST:  11/20/2021 OMNIPAQUE IOHEXOL 300 MG/ML  SOLN COMPARISON:  11/02/2021 FINDINGS: Lower chest: Unremarkable. Hepatobiliary: No suspicious focal abnormality within the liver parenchyma. Gallbladder is surgically absent. No intrahepatic or extrahepatic biliary dilation. Pancreas: No focal mass lesion. No dilatation of the main duct. No intraparenchymal cyst. No peripancreatic edema. Spleen: No splenomegaly. No focal mass lesion. Adrenals/Urinary Tract: No adrenal nodule or mass. Right kidney unremarkable. 9 mm nonobstructing stone identified lower pole left kidney. Left ureter unremarkable. The urinary bladder appears normal for the degree of distention. Stomach/Bowel: Tiny hiatal hernia. Stomach otherwise unremarkable. Duodenum is normally positioned as is the ligament of Treitz. No small bowel wall thickening. No small bowel dilatation. The terminal ileum is normal. The appendix is normal. Stranding in the region of the cecum is  similar to prior. Diverticular changes are noted in the left colon without evidence of diverticulitis. Vascular/Lymphatic: No abdominal aortic aneurysm. No abdominal aortic atherosclerotic calcification. There is no gastrohepatic or hepatoduodenal ligament lymphadenopathy. No retroperitoneal or mesenteric lymphadenopathy. No pelvic sidewall lymphadenopathy. Reproductive: Unremarkable. Other: No  intraperitoneal free fluid. Musculoskeletal: No worrisome lytic or sclerotic osseous abnormality. IMPRESSION: 1. No acute findings in the abdomen or pelvis. 2. Subtle stranding in the pericecal region is similar to prior study, nonspecific. 3. 9 mm nonobstructing stone lower pole left kidney. 4. Left colonic diverticulosis without diverticulitis. 5. Tiny hiatal hernia. Electronically Signed   By: Kennith Center M.D.   On: 11/18/2021 14:30    Procedures Procedures    Medications Ordered in ED Medications  sodium chloride 0.9 % bolus 1,000 mL (has no administration in time range)  sodium chloride 0.9 % bolus 1,000 mL (1,000 mLs Intravenous New Bag/Given 11/18/21 1303)  morphine (PF) 4 MG/ML injection 4 mg (4 mg Intravenous Given 11/18/21 1304)  ondansetron (ZOFRAN) injection 4 mg (4 mg Intravenous Given 11/18/21 1304)  iohexol (OMNIPAQUE) 300 MG/ML solution 100 mL (100 mLs Intravenous Contrast Given 11/18/21 1414)    ED Course/ Medical Decision Making/ A&P                           Medical Decision Making Amount and/or Complexity of Data Reviewed Labs: ordered. Radiology: ordered.  Risk Prescription drug management.   On cardiac monitoring, sinus rhythm, tachycardic.  Chart review shows prior visit for acute diverticulitis 11/28/2021.  Work-up today shows white count of 12, chemistry remarkable.  CT abdomen pelvis pursued.  Patient given IV fluids and pain medication with improvement of heart rate.         Final Clinical Impression(s) / ED Diagnoses Final diagnoses:  Abdominal pain,  unspecified abdominal location    Rx / DC Orders ED Discharge Orders     None         Cheryll Cockayne, MD 11/18/21 1451

## 2021-11-18 NOTE — ED Triage Notes (Signed)
Pt had diverticulitis end of Feb and was put on antibiotics but pain has not resolved. Abd pain, vomiting and diarrhea started 3 days ago. Pt states she feels dehydrated. Pain is on left upper and lower quad.  ?

## 2021-11-18 NOTE — ED Notes (Signed)
Patient transported to CT 

## 2021-11-18 NOTE — ED Provider Notes (Signed)
Care transferred to me.  Patient is feeling better.  Urinalysis has been reviewed and is unremarkable.  CT seems to show no significant change since a couple weeks ago and currently she is describing significant nausea/vomiting/diarrhea.  We discussed supportive care for the diarrhea and will refill the Zofran that seem to work.  She is also asking for a metformin refill.  Follow-up with PCP but at this point I think she should get a referral to gastroenterology.  She does remember who she saw in the past but has not seen one in quite some time. ?  ?Pricilla Loveless, MD ?11/18/21 1550 ? ?

## 2021-11-29 ENCOUNTER — Ambulatory Visit: Payer: 59 | Admitting: Family

## 2021-12-12 ENCOUNTER — Encounter: Payer: Self-pay | Admitting: Family

## 2021-12-12 ENCOUNTER — Ambulatory Visit (INDEPENDENT_AMBULATORY_CARE_PROVIDER_SITE_OTHER): Payer: 59 | Admitting: Family

## 2021-12-12 VITALS — BP 127/84 | HR 83 | Temp 98.0°F | Ht 64.0 in | Wt 315.5 lb

## 2021-12-12 DIAGNOSIS — E785 Hyperlipidemia, unspecified: Secondary | ICD-10-CM

## 2021-12-12 DIAGNOSIS — E1140 Type 2 diabetes mellitus with diabetic neuropathy, unspecified: Secondary | ICD-10-CM

## 2021-12-12 DIAGNOSIS — Z124 Encounter for screening for malignant neoplasm of cervix: Secondary | ICD-10-CM

## 2021-12-12 DIAGNOSIS — Z794 Long term (current) use of insulin: Secondary | ICD-10-CM

## 2021-12-12 DIAGNOSIS — E13319 Other specified diabetes mellitus with unspecified diabetic retinopathy without macular edema: Secondary | ICD-10-CM

## 2021-12-12 DIAGNOSIS — Z1231 Encounter for screening mammogram for malignant neoplasm of breast: Secondary | ICD-10-CM

## 2021-12-12 DIAGNOSIS — Z6841 Body Mass Index (BMI) 40.0 and over, adult: Secondary | ICD-10-CM

## 2021-12-12 DIAGNOSIS — E1169 Type 2 diabetes mellitus with other specified complication: Secondary | ICD-10-CM | POA: Diagnosis not present

## 2021-12-12 HISTORY — DX: Other specified diabetes mellitus with unspecified diabetic retinopathy without macular edema: E13.319

## 2021-12-12 LAB — POCT GLYCOSYLATED HEMOGLOBIN (HGB A1C): Hemoglobin A1C: 9 % — AB (ref 4.0–5.6)

## 2021-12-12 MED ORDER — OZEMPIC (1 MG/DOSE) 4 MG/3ML ~~LOC~~ SOPN: 1.0000 mg | PEN_INJECTOR | SUBCUTANEOUS | 2 refills | Status: DC

## 2021-12-12 MED ORDER — FREESTYLE LIBRE 14 DAY READER DEVI: 1.0000 | Freq: Every day | 0 refills | Status: DC

## 2021-12-12 MED ORDER — FREESTYLE LIBRE 3 SENSOR MISC: 1.0000 | 2 refills | Status: DC

## 2021-12-12 MED ORDER — LISINOPRIL 5 MG PO TABS: 5.0000 mg | ORAL_TABLET | Freq: Every day | ORAL | 0 refills | Status: DC

## 2021-12-12 MED ORDER — SIMVASTATIN 20 MG PO TABS: 20.0000 mg | ORAL_TABLET | Freq: Every day | ORAL | 0 refills | Status: DC

## 2021-12-12 MED ORDER — TOUJEO SOLOSTAR 300 UNIT/ML ~~LOC~~ SOPN: 50.0000 [IU] | PEN_INJECTOR | Freq: Every day | SUBCUTANEOUS | 1 refills | Status: DC

## 2021-12-12 MED ORDER — METFORMIN HCL ER 500 MG PO TB24: 1000.0000 mg | ORAL_TABLET | Freq: Every day | ORAL | 2 refills | Status: DC

## 2021-12-12 NOTE — Assessment & Plan Note (Addendum)
Chronic - Uncontrolled - A1C today 9.0 - pt does not check CBG at home, states she forgets d/t her many mental health issues. Ordering freestyle monitoring system today and refilling all diabetic meds & Lisinopril. pt previously with [facility] endo referring to our ENDO. ?

## 2021-12-12 NOTE — Patient Instructions (Addendum)
Welcome to [facility]! It was a pleasure meeting you today. ? ?As discussed, I have sent your diabetic meds and the freestyle libre monitoring system to your pharmacy.  ?I have also ordered a mammogram and the Breast center will call you directly as well as sent referrals to Endocrinology and Gynecology. ? ?Please schedule a 3 follow up visit today for future refills. ? ? ? ? ? ?PLEASE NOTE: ? ?If you had any LAB tests please let us know if you have not heard back within a few days. You may see your results on MyChart before we have a chance to review them but we will give you a call once they are reviewed by [place]. If we ordered any REFERRALS today, please let us know if you have not heard from their office within the next week.  ?Let us know through MyChart if you are needing REFILLS, or have your pharmacy send [place] the request. You can also use MyChart to communicate with me or any office staff. ? ?

## 2021-12-12 NOTE — Progress Notes (Signed)
? ?New Patient Office Visit ? ?Subjective:  ?Patient ID: Alicia Lucero, female    DOB: 1960/10/22  Age: 60 y.o. MRN: [id number] ? ?CC:  ?Chief Complaint  ?Patient presents with  ? Establish Care  ? Diabetes  ?  Type 2 in the past.  ? GI Problem  ?  Follow up on diverticulitis.   ? ?HPI ?Alicia Lucero presents for establishing care. ? ?T2DM: Pt is currently maintained on the following medications for diabetes:Metformin, Ozempic, Toujeo ?Failed meds include: none ?Denies polyuria/polydipsia/polyphagia ?Denies hypoglycemia ?Home glucose readings range: pt forgets to check. ?Last A1C was  ?Lab Results  ?Component Value Date  ? HGBA1C 9.0 (A) 12/12/2021  ? HGBA1C 7.4 (H) 05/18/2017  ? HGBA1C 7.4 04/17/2017  ?Hyperlipidemia: Patient is currently maintained on the following medication for hyperlipidemia: Simvastatin. Patient denies myalgia or other side effects. ?Patient reports good compliance with low fat/low cholesterol diet.  ?Last lipid panel as follows: ?Lab Results  ?Component Value Date  ? CHOL 164 05/18/2017  ? HDL 67 05/18/2017  ? LDLCALC 76 05/18/2017  ? TRIG 106 05/18/2017  ? CHOLHDL 2.6 04/17/2017  ? ? ?Past Medical History:  ?Diagnosis Date  ? Allergy   ? Anxiety 1992  ? Arthritis 2007  ? Back pain   ? Benign essential HTN   ? Bipolar 1 disorder (HCC)   ? Cataract 2019  ? Chest pain   ? Depression   ? Diabetes mellitus without complication (HCC)   ? Diabetic peripheral neuropathy (HCC)   ? Drug use   ? Dyspnea   ? Fatty liver   ? Gallbladder problem   ? GERD (gastroesophageal reflux disease)   ? Hyperlipidemia   ? Infertility, female   ? Joint pain   ? Microalbuminuria 06/07/2021  ? Neuropathy   ? PCOS (polycystic ovarian syndrome)   ? PCOS (polycystic ovarian syndrome)   ? Perimenopausal   ? 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 Sciatica   ? Trigger finger of left thumb 07/08/2019  ? Type 2 diabetes mellitus with retinopathy, with long-term current use of insulin (HCC) 06/01/2017  ? ? ?Past  Surgical History:  ?Procedure Laterality Date  ? CHOLECYSTECTOMY  09/08/1994  ? DILATION AND CURETTAGE OF UTERUS    ? secondary to menorrhagia  ? EYE SURGERY  2021  ? Cataract temoval  ? KNEE ARTHROSCOPY Left 09/08/2006  ? TONSILLECTOMY    ? as a child  ? WISDOM TOOTH EXTRACTION    ? ? ?Family History  ?Problem Relation Age of Onset  ? Cancer Mother   ?     lung  ? Mental illness Mother   ?     bipolar mania  ? Hypertension Mother   ? Depression Mother   ? Early death Mother   ? Cancer Father   ?     liver  ? Hypertension Father   ? Heart disease Father   ?     CAD/CABG  ? Arthritis Father   ? Multiple sclerosis Sister   ? Hypertension Sister   ? Diabetes Sister   ? ? ?Social History  ? ?Socioeconomic History  ? Marital status: Married  ?  Spouse name: [name]  ? Number of children: 0  ? Years of education: Assoc.  ? Highest education level: Not on file  ?Occupational History  ? Occupation: professional caregiver  ?Tobacco Use  ? Smoking status: Former  ?  Packs/day: 1.00  ?  Years: 25.00  ?  Pack years:  25.00  ?  Types: Cigarettes  ?  Quit date: 06/30/2005  ?  Years since quitting: 16.4  ? Smokeless tobacco: Never  ? Tobacco comments:  ?  electronic cigarettes PRN  ?Vaping Use  ? Vaping Use: Never used  ?Substance and Sexual Activity  ? Alcohol use: No  ?  Comment: quit: 2007  ? Drug use: No  ?  Comment: occasional  ? Sexual activity: Not Currently  ?  Partners: Male  ?  Birth control/protection: Post-menopausal, None  ?  Comment: 1st intercourse- [age], partners- 10, married- 13 yr s  ?Other Topics Concern  ? Not on file  ?Social History Narrative  ? Patient lives at home with her spouse.  ? Caffeine Use: 5-7 caffeine drinks daily  ? ?Social Determinants of Health  ? ?Financial Resource Strain: Not on file  ?Food Insecurity: Not on file  ?Transportation Needs: Not on file  ?Physical Activity: Not on file  ?Stress: Not on file  ?Social Connections: Not on file  ?Intimate Partner Violence: Not on file  ? ? ?Objective:   ? ?Today's Vitals: BP 127/84 (BP Location: Left Arm, Patient Position: Sitting, Cuff Size: Large)   Pulse 83   Temp 98 ?F (36.7 ?C) (Temporal)   Ht 5\' 4"  (1.626 m)   Wt (!) 315 lb 8 oz (143.1 kg)   LMP 03/28/2018   SpO2 95%   BMI 54.16 kg/m?  ? ?Physical Exam ?Vitals and nursing note reviewed.  ?Constitutional:   ?   Appearance: Normal appearance. She is morbidly obese.  ?Cardiovascular:  ?   Rate and Rhythm: Normal rate and regular rhythm.  ?Pulmonary:  ?   Effort: Pulmonary effort is normal.  ?   Breath sounds: Normal breath sounds.  ?Musculoskeletal:     ?   General: Normal range of motion.  ?Skin: ?   General: Skin is warm and dry.  ?Neurological:  ?   Mental Status: She is alert.  ?Psychiatric:     ?   Mood and Affect: Mood normal.     ?   Behavior: Behavior normal.  ? ? ?Assessment & Plan:  ? ?Problem List Items Addressed This Visit   ? ?  ? Endocrine  ? Type 2 diabetes mellitus with diabetic neuropathy, with long-term current use of insulin (HCC) - Primary  ?  Chronic - Uncontrolled - A1C today 9.0 - pt does not check CBG at home, states she forgets d/t her many mental health issues. Ordering freestyle monitoring system today and refilling all diabetic meds & Lisinopril. pt previously with [facility] endo referring to our ENDO. ?  ?  ? Relevant Medications  ? simvastatin (ZOCOR) 20 MG tablet  ? metFORMIN (GLUCOPHAGE-XR) 500 MG 24 hr tablet  ? lisinopril (ZESTRIL) 5 MG tablet  ? OZEMPIC, 1 MG/DOSE, 4 MG/3ML SOPN  ? insulin glargine, 1 Unit Dial, (TOUJEO SOLOSTAR) 300 UNIT/ML Solostar Pen  ? Continuous Blood Gluc Sensor (FREESTYLE LIBRE 3 SENSOR) MISC  ? Continuous Blood Gluc Receiver (FREESTYLE LIBRE 14 DAY READER) DEVI  ? Other Relevant Orders  ? POCT HgB A1C (Completed)  ? Ambulatory referral to Endocrinology  ? Dyslipidemia associated with type 2 diabetes mellitus (HCC)  ?  Chronic - Zocor- pt unsure when lipids last checked, advised need for fasting lipids next visit if not established with ENDO. ?  ?   ? Relevant Medications  ? simvastatin (ZOCOR) 20 MG tablet  ? metFORMIN (GLUCOPHAGE-XR) 500 MG 24 hr tablet  ? lisinopril (ZESTRIL)  5 MG tablet  ? OZEMPIC, 1 MG/DOSE, 4 MG/3ML SOPN  ? insulin glargine, 1 Unit Dial, (TOUJEO SOLOSTAR) 300 UNIT/ML Solostar Pen  ? 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 Relevant Medications  ? simvastatin (ZOCOR) 20 MG tablet  ? metFORMIN (GLUCOPHAGE-XR) 500 MG 24 hr tablet  ? lisinopril (ZESTRIL) 5 MG tablet  ? OZEMPIC, 1 MG/DOSE, 4 MG/3ML SOPN  ? insulin glargine, 1 Unit Dial, (TOUJEO SOLOSTAR) 300 UNIT/ML Solostar Pen  ?  ? Other  ? Morbid obesity with BMI of 50.0-59.9, adult (HCC)  ? Relevant Medications  ? metFORMIN (GLUCOPHAGE-XR) 500 MG 24 hr tablet  ? OZEMPIC, 1 MG/DOSE, 4 MG/3ML SOPN  ? insulin glargine, 1 Unit Dial, (TOUJEO SOLOSTAR) 300 UNIT/ML Solostar Pen  ? ?Other Visit Diagnoses   ? ? Encounter for Pap smear of cervix with HPV DNA cotesting      ? Relevant Orders  ? Ambulatory referral to Obstetrics / Gynecology  ? Breast cancer screening by mammogram      ? Relevant Orders  ? MM Digital Screening  ? ?  ? ? ?Outpatient Encounter Medications as of 12/12/2021  ?Medication Sig  ? ACCU-CHEK AVIVA PLUS test strip USE AS DIRECTED  ? ACCU-CHEK SOFTCLIX LANCETS lancets USE AS DIRECTED  ? acetaminophen-codeine (TYLENOL #3) 300-30 MG tablet Take 1 tablet by mouth every 8 (eight) hours as needed for moderate pain.  ? albuterol (PROVENTIL) (2.5 MG/3ML) 0.083% nebulizer solution Take 3 mLs (2.5 mg total) by nebulization every 6 (six) hours as needed for wheezing or shortness of breath.  ? albuterol (VENTOLIN HFA) 108 (90 Base) MCG/ACT inhaler inhale 2 puffs by mouth every 4 hours if needed for cough wheezing or shortness of breath  ? B-D ULTRAFINE III SHORT PEN 31G X 8 MM MISC use as directed  ? Cholecalciferol (VITAMIN D) 2000 units tablet Take 1 tablet (2,000 Units total) by mouth daily.  ? clonazePAM (KLONOPIN) 1 MG tablet Take 1 tablet (1 mg total) by mouth 2 (two) times  daily as needed. (Patient taking differently: Take 1 mg by mouth 3 (three) times daily as needed for anxiety.)  ? Continuous Blood Gluc Receiver (FREESTYLE LIBRE 14 DAY READER) DEVI 1 each by Does not apply route dail

## 2021-12-12 NOTE — Assessment & Plan Note (Addendum)
Chronic - Zocor- pt unsure when lipids last checked, advised need for fasting lipids next visit if not established with ENDO. ?

## 2022-01-22 ENCOUNTER — Encounter: Payer: Self-pay | Admitting: Family

## 2022-01-22 ENCOUNTER — Other Ambulatory Visit: Payer: Self-pay

## 2022-01-22 DIAGNOSIS — E1140 Type 2 diabetes mellitus with diabetic neuropathy, unspecified: Secondary | ICD-10-CM

## 2022-01-22 MED ORDER — TOUJEO SOLOSTAR 300 UNIT/ML ~~LOC~~ SOPN: 50.0000 [IU] | PEN_INJECTOR | Freq: Every day | SUBCUTANEOUS | 1 refills | Status: DC

## 2022-01-30 NOTE — Telephone Encounter (Signed)
Lvm for pt to return call. Pt Toujeo medication is at the [facility] at [address] [facility].

## 2022-02-17 ENCOUNTER — Other Ambulatory Visit: Payer: Self-pay

## 2022-02-17 ENCOUNTER — Other Ambulatory Visit: Payer: Self-pay | Admitting: Family

## 2022-02-17 ENCOUNTER — Encounter: Payer: Self-pay | Admitting: Family

## 2022-02-17 DIAGNOSIS — E1140 Type 2 diabetes mellitus with diabetic neuropathy, unspecified: Secondary | ICD-10-CM

## 2022-02-17 MED ORDER — METFORMIN HCL ER (MOD) 1000 MG PO TB24: 1000.0000 mg | ORAL_TABLET | Freq: Every day | ORAL | 1 refills | Status: DC

## 2022-02-18 ENCOUNTER — Other Ambulatory Visit: Payer: Self-pay

## 2022-02-18 ENCOUNTER — Telehealth: Payer: Self-pay | Admitting: Family

## 2022-02-18 DIAGNOSIS — E1140 Type 2 diabetes mellitus with diabetic neuropathy, unspecified: Secondary | ICD-10-CM

## 2022-02-18 MED ORDER — TOUJEO SOLOSTAR 300 UNIT/ML ~~LOC~~ SOPN: 50.0000 [IU] | PEN_INJECTOR | Freq: Every day | SUBCUTANEOUS | 0 refills | Status: DC

## 2022-02-18 NOTE — Telephone Encounter (Signed)
Patient out of insulin - does not have enough for the night - message sent to Deja- Deja will call the patient -

## 2022-02-18 NOTE — Telephone Encounter (Signed)
Sent. I called and let pt know she will need to schedule an appointment for further refills.

## 2022-02-19 ENCOUNTER — Other Ambulatory Visit: Payer: Self-pay

## 2022-02-19 DIAGNOSIS — E1140 Type 2 diabetes mellitus with diabetic neuropathy, unspecified: Secondary | ICD-10-CM

## 2022-02-19 MED ORDER — TOUJEO SOLOSTAR 300 UNIT/ML ~~LOC~~ SOPN: 64.0000 [IU] | PEN_INJECTOR | Freq: Every day | SUBCUTANEOUS | 3 refills | Status: DC

## 2022-02-25 ENCOUNTER — Encounter: Payer: Self-pay | Admitting: Orthopaedic Surgery

## 2022-02-25 ENCOUNTER — Ambulatory Visit (INDEPENDENT_AMBULATORY_CARE_PROVIDER_SITE_OTHER): Payer: 59 | Admitting: Orthopaedic Surgery

## 2022-02-25 VITALS — Ht 64.0 in | Wt 314.0 lb

## 2022-02-25 DIAGNOSIS — M1712 Unilateral primary osteoarthritis, left knee: Secondary | ICD-10-CM | POA: Diagnosis not present

## 2022-02-25 NOTE — Progress Notes (Unsigned)
Office Visit Note   Patient: Alicia Lucero           Date of Birth: Jul 28, 1961           MRN: [id number] Visit Date: 02/25/2022              Requested by: Dulce Sellar, NP [address] [place],  [place] [address] PCP: Dulce Sellar, NP   Assessment & Plan: Visit Diagnoses:  1. Unilateral primary osteoarthritis, left knee     Plan: Impression is left knee arthritis.  Today, we proceeded with repeat cortisone injection as she has had good relief in the past.  She will follow-up with [place] as needed.  Continue to make all efforts at weight loss.  Follow-Up Instructions: Return if symptoms worsen or fail to improve.   Orders:  Orders Placed This Encounter  Procedures   Large Joint Inj: L knee   No orders of the defined types were placed in this encounter.     Procedures: Large Joint Inj: L knee on 02/25/2022 4:05 PM Indications: pain Details: 22 G needle, anterolateral approach Medications: 2 mL lidocaine 1 %; 2 mL bupivacaine 0.25 %; 40 mg methylPREDNISolone acetate 40 MG/ML      Clinical Data: No additional findings.   Subjective: Chief Complaint  Patient presents with   Left Knee - Pain    HPI patient is a pleasant 61 year old female who comes in today with recurrent left knee pain.  History of osteoarthritis to the left knee.  She was seen by [place] in January where cortisone injection was performed.  This significantly helped until yesterday.  Pain is returned.  Pain is to the anteromedial aspect and is worse with walking.  She does have giving way sensation.  She has been taking Tylenol without significant relief.  Review of Systems as detailed in HPI.  All others reviewed and are negative.   Objective: Vital Signs: Ht 5\' 4"  (1.626 m)   Wt (!) 314 lb (142.4 kg)   LMP 03/28/2018   BMI 53.90 kg/m   Physical Exam well-developed well-nourished female no acute distress.  Alert and oriented x3.  Ortho Exam left knee exam shows no effusion.  Range of  motion 0 to 100 degrees.  Mild medial joint line tenderness.  She is neurovascular tact distally.  Specialty Comments:  No specialty comments available.  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) 12/12/2021   Diverticular disease of colon 10/31/2021   Family history of colon cancer 10/31/2021   Sciatica 10/31/2021   11/02/2021 disease (radial styloid tenosynovitis) 07/08/2019   Gastroesophageal reflux disease without esophagitis 10/27/2018   PCOS (polycystic ovarian syndrome) 10/27/2018   Diabetic peripheral neuropathy (HCC) 10/27/2018   Binge eating disorder 10/12/2017   Vitamin D deficiency 06/01/2017   Bipolar 2 disorder (HCC) 06/01/2017   Primary osteoarthritis of left knee 09/08/2016   Type 2 diabetes mellitus with diabetic neuropathy, with long-term current use of insulin (HCC) 07/19/2015   Dyslipidemia associated with type 2 diabetes mellitus (HCC) 07/19/2015   Benign essential HTN 07/19/2015   Morbid obesity with BMI of 50.0-59.9, adult (HCC) 12/26/2014   Asthma, chronic 09/21/2013   Past Medical History:  Diagnosis Date   Allergy    Anxiety 1992   Arthritis 2007   Back pain    Benign essential HTN    Bipolar 1 disorder (HCC)    Cataract 2019  Chest pain    Depression    Diabetes mellitus without complication (HCC)    Diabetic peripheral neuropathy (HCC)    Drug use    Dyspnea    Fatty liver    Gallbladder problem    GERD (gastroesophageal reflux disease)    Hyperlipidemia    Infertility, female    Joint pain    Microalbuminuria 06/07/2021   Neuropathy    PCOS (polycystic ovarian syndrome)    PCOS (polycystic ovarian syndrome)    Perimenopausal    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) 12/12/2021   Sciatica    Trigger finger of left thumb 07/08/2019   Type 2 diabetes mellitus with retinopathy, with long-term current use of insulin (HCC) 06/01/2017     Family History  Problem Relation Age of Onset   Cancer Mother        lung   Mental illness Mother        bipolar mania   Hypertension Mother    Depression Mother    Early death Mother    Cancer Father        liver   Hypertension Father    Heart disease Father        CAD/CABG   Arthritis Father    Multiple sclerosis Sister    Hypertension Sister    Diabetes Sister     Past Surgical History:  Procedure Laterality Date   CHOLECYSTECTOMY  09/08/1994   DILATION AND CURETTAGE OF UTERUS     secondary to menorrhagia   EYE SURGERY  2021   Cataract temoval   KNEE ARTHROSCOPY Left 09/08/2006   TONSILLECTOMY     as a child   WISDOM TOOTH EXTRACTION     Social History   Occupational History   Occupation: professional caregiver  Tobacco Use   Smoking status: Former    Packs/day: 1.00    Years: 25.00    Total pack years: 25.00    Types: Cigarettes    Quit date: 06/30/2005    Years since quitting: 16.6   Smokeless tobacco: Never   Tobacco comments:    electronic cigarettes PRN  Vaping Use   Vaping Use: Never used  Substance and Sexual Activity   Alcohol use: No    Comment: quit: 2007   Drug use: No    Comment: occasional   Sexual activity: Not Currently    Partners: Male    Birth control/protection: Post-menopausal, None    Comment: 1st intercourse- 16, partners- 10, married- 15 yr s

## 2022-02-26 MED ORDER — LIDOCAINE HCL 1 % IJ SOLN
2.0000 mL | INTRAMUSCULAR | Status: AC | PRN
  Administered 2022-02-25: 2 mL

## 2022-02-26 MED ORDER — BUPIVACAINE HCL 0.25 % IJ SOLN
2.0000 mL | INTRAMUSCULAR | Status: AC | PRN
  Administered 2022-02-25: 2 mL via INTRA_ARTICULAR

## 2022-02-26 MED ORDER — METHYLPREDNISOLONE ACETATE 40 MG/ML IJ SUSP
40.0000 mg | INTRAMUSCULAR | Status: AC | PRN
  Administered 2022-02-25: 40 mg via INTRA_ARTICULAR

## 2022-03-12 ENCOUNTER — Encounter: Payer: Self-pay | Admitting: Family

## 2022-03-12 ENCOUNTER — Ambulatory Visit (INDEPENDENT_AMBULATORY_CARE_PROVIDER_SITE_OTHER): Payer: 59 | Admitting: Family

## 2022-03-12 VITALS — BP 100/72 | HR 83 | Temp 98.1°F | Ht 64.0 in | Wt 313.4 lb

## 2022-03-12 DIAGNOSIS — E1169 Type 2 diabetes mellitus with other specified complication: Secondary | ICD-10-CM

## 2022-03-12 DIAGNOSIS — E785 Hyperlipidemia, unspecified: Secondary | ICD-10-CM

## 2022-03-12 DIAGNOSIS — Z794 Long term (current) use of insulin: Secondary | ICD-10-CM

## 2022-03-12 DIAGNOSIS — I152 Hypertension secondary to endocrine disorders: Secondary | ICD-10-CM

## 2022-03-12 DIAGNOSIS — E1159 Type 2 diabetes mellitus with other circulatory complications: Secondary | ICD-10-CM

## 2022-03-12 DIAGNOSIS — E1140 Type 2 diabetes mellitus with diabetic neuropathy, unspecified: Secondary | ICD-10-CM | POA: Diagnosis not present

## 2022-03-12 MED ORDER — SIMVASTATIN 20 MG PO TABS
20.0000 mg | ORAL_TABLET | Freq: Every day | ORAL | 0 refills | Status: DC
Start: 2022-03-12 — End: 2022-12-22

## 2022-03-12 MED ORDER — METFORMIN HCL ER (MOD) 1000 MG PO TB24: 1000.0000 mg | ORAL_TABLET | Freq: Two times a day (BID) | ORAL | 1 refills | Status: DC

## 2022-03-12 MED ORDER — LISINOPRIL 5 MG PO TABS: 5.0000 mg | ORAL_TABLET | Freq: Every day | ORAL | 0 refills | Status: DC

## 2022-03-12 MED ORDER — BD PEN NEEDLE SHORT U/F 31G X 8 MM MISC: 3 refills | Status: AC

## 2022-03-12 MED ORDER — ONDANSETRON HCL 4 MG PO TABS: 4.0000 mg | ORAL_TABLET | Freq: Four times a day (QID) | ORAL | 0 refills | Status: DC

## 2022-03-12 MED ORDER — SEMAGLUTIDE (2 MG/DOSE) 8 MG/3ML ~~LOC~~ SOPN: 2.0000 mg | PEN_INJECTOR | SUBCUTANEOUS | 0 refills | Status: DC

## 2022-03-12 MED ORDER — TOUJEO SOLOSTAR 300 UNIT/ML ~~LOC~~ SOPN
64.0000 [IU] | PEN_INJECTOR | Freq: Every day | SUBCUTANEOUS | 3 refills | Status: DC
Start: 2022-03-12 — End: 2022-04-17

## 2022-03-12 NOTE — Assessment & Plan Note (Signed)
Chronic - stable on Lisinopril, refill sent today, f/u in 11mos.

## 2022-03-12 NOTE — Assessment & Plan Note (Addendum)
Chronic- uncontrolled, can't get in to ENDO until Dec. -has increased her Toujeo dose up to 64 units, today fasting CBG 140, advised ok to continue to titrate up 1 unit qd to get fasting down to 120, but she needs to be consistent in checking daily. med refills sent in, increasing Ozempic dose to 2mg , pt has been tolerating. Requesting Zofran in case she has some nausea again with higher dose. f/u in 2-3 mos.

## 2022-03-12 NOTE — Progress Notes (Signed)
Subjective:     Patient ID: Alicia Lucero, female    DOB: 07/01/1961, 61 y.o.   MRN: [id number]  Chief Complaint  Patient presents with   Diabetes    Follow up, Blood sugars at home are ok.    Dizziness    Pt c/o dizziness usually when she gets up from sitting or bending over. But has happened before while standing in the shower. Present for about month. Lasts for about a minute but yesterday when she got out of bed it lasted for about 5 minutes.Room spinning feeling.    Numbness    Burning in tingling in feet more often. Has been taking Gabapentin. Endo appointment is not until December.     HPI: Hypertension: Patient is currently maintained on the following medications for blood pressure: Lisinopril Patient reports good compliance with blood pressure medications. Patient denies chest pain, headaches, shortness of breath or swelling. Last 3 blood pressure readings in our office are as follows: BP Readings from Last 3 Encounters:  03/12/22 100/72  12/12/21 127/84  11/18/21 109/64   T2DM: Pt is currently maintained on the following medications for diabetes:Metformin, Ozempic, Toujeo Failed meds include: none Denies polyuria/polydipsia/polyphagia Denies hypoglycemia Home glucose readings range: pt forgets to check, but this am was 140 fasting. Not able to get the [place] device covered. Reports taking all meds as directed, Toujeo dose is up to 64units.  Assessment & Plan:   Problem List Items Addressed This Visit       Cardiovascular and Mediastinum   Hypertension associated with diabetes (HCC)    Chronic - stable on Lisinopril, refill sent today, f/u in 58mos.      Relevant Medications   metFORMIN (GLUMETZA) 1000 MG (MOD) 24 hr tablet   simvastatin (ZOCOR) 20 MG tablet   lisinopril (ZESTRIL) 5 MG tablet   Semaglutide, 2 MG/DOSE, 8 MG/3ML SOPN   insulin glargine, 1 Unit Dial, (TOUJEO SOLOSTAR) 300 UNIT/ML Solostar Pen     Endocrine   Type 2 diabetes mellitus with diabetic  neuropathy, with long-term current use of insulin (HCC) - Primary    Chronic- uncontrolled, can't get in to ENDO until Dec. -has increased her Toujeo dose up to 64 units, today fasting CBG 140, advised ok to continue to titrate up 1 unit qd to get fasting down to 120, but she needs to be consistent in checking daily. med refills sent in, increasing Ozempic dose to 2mg , pt has been tolerating. Requesting Zofran in case she has some nausea again with higher dose.      Relevant Medications   metFORMIN (GLUMETZA) 1000 MG (MOD) 24 hr tablet   simvastatin (ZOCOR) 20 MG tablet   lisinopril (ZESTRIL) 5 MG tablet   Semaglutide, 2 MG/DOSE, 8 MG/3ML SOPN   insulin glargine, 1 Unit Dial, (TOUJEO SOLOSTAR) 300 UNIT/ML Solostar Pen   Dyslipidemia associated with type 2 diabetes mellitus (HCC)   Relevant Medications   metFORMIN (GLUMETZA) 1000 MG (MOD) 24 hr tablet   simvastatin (ZOCOR) 20 MG tablet   lisinopril (ZESTRIL) 5 MG tablet   Semaglutide, 2 MG/DOSE, 8 MG/3ML SOPN   insulin glargine, 1 Unit Dial, (TOUJEO SOLOSTAR) 300 UNIT/ML Solostar Pen    Outpatient Medications Prior to Visit  Medication Sig Dispense Refill   ACCU-CHEK AVIVA PLUS test strip USE AS DIRECTED 100 each 0   ACCU-CHEK SOFTCLIX LANCETS lancets USE AS DIRECTED 100 each 1   albuterol (VENTOLIN HFA) 108 (90 Base) MCG/ACT inhaler inhale 2 puffs by mouth every 4 hours  if needed for cough wheezing or shortness of breath 18 g 1   B-D ULTRAFINE III SHORT PEN 31G X 8 MM MISC use as directed 100 each 3   Cholecalciferol (VITAMIN D) 2000 units tablet Take 1 tablet (2,000 Units total) by mouth daily. 30 tablet 0   clonazePAM (KLONOPIN) 1 MG tablet Take 1 tablet (1 mg total) by mouth 2 (two) times daily as needed. (Patient taking differently: Take 1 mg by mouth 3 (three) times daily as needed for anxiety.) 60 tablet 2   esomeprazole (NEXIUM) 40 MG capsule Take by mouth.     gabapentin (NEURONTIN) 600 MG tablet Take 600 mg by mouth 2 (two) times  daily.  0   GLUCOSA-CHONDR-NA CHONDR-MSM PO Take by mouth.     ketoconazole (NIZORAL) 2 % cream Apply 1 application topically 2 (two) times daily. 60 g 0   lamoTRIgine (LAMICTAL) 200 MG tablet Take 2 tablets (400 mg total) by mouth every evening. 180 tablet 1   lisdexamfetamine (VYVANSE) 60 MG capsule Take 60 mg by mouth daily.      Multiple Vitamins-Minerals (WOMENS MULTIVITAMIN PLUS PO) Take 1 tablet by mouth daily.      REXULTI 1 MG TABS tablet Take 1 mg by mouth daily.     sertraline (ZOLOFT) 100 MG tablet Take 2 tablets (200 mg total) by mouth daily. 180 tablet 1   TRINTELLIX 5 MG TABS tablet Take 15 mg by mouth daily.   0   acetaminophen-codeine (TYLENOL #3) 300-30 MG tablet Take 1 tablet by mouth every 8 (eight) hours as needed for moderate pain. 30 tablet 2   albuterol (PROVENTIL) (2.5 MG/3ML) 0.083% nebulizer solution Take 3 mLs (2.5 mg total) by nebulization every 6 (six) hours as needed for wheezing or shortness of breath. 150 mL 1   Continuous Blood Gluc Receiver (FREESTYLE LIBRE 14 DAY READER) DEVI 1 each by Does not apply route daily. 1 each 0   Continuous Blood Gluc Sensor (FREESTYLE LIBRE 3 SENSOR) MISC 1 each by Does not apply route every 14 (fourteen) days. Place 1 sensor on the skin every 14 days. Use to check glucose continuously 2 each 2   diclofenac (VOLTAREN) 75 MG EC tablet Take 1 tablet (75 mg total) by mouth 2 (two) times daily. 30 tablet 2   HYDROcodone-acetaminophen (NORCO) 5-325 MG tablet Take 1-2 tablets by mouth daily as needed. 10 tablet 0   insulin glargine, 1 Unit Dial, (TOUJEO SOLOSTAR) 300 UNIT/ML Solostar Pen Inject 64 Units into the skin daily. 1.5 mL 3   lisinopril (ZESTRIL) 5 MG tablet Take 1 tablet (5 mg total) by mouth daily. 90 tablet 0   metFORMIN (GLUMETZA) 1000 MG (MOD) 24 hr tablet Take 1 tablet (1,000 mg total) by mouth daily with breakfast. 120 tablet 1   ondansetron (ZOFRAN) 4 MG tablet Take 1 tablet (4 mg total) by mouth every 6 (six) hours.  (Patient taking differently: Take 4 mg by mouth every 8 (eight) hours as needed.) 12 tablet 0   OZEMPIC, 1 MG/DOSE, 4 MG/3ML SOPN Inject 1 mg into the skin once a week. 3 mL 2   simvastatin (ZOCOR) 20 MG tablet Take 1 tablet (20 mg total) by mouth at bedtime. 90 tablet 0   insulin glargine, 1 Unit Dial, (TOUJEO SOLOSTAR) 300 UNIT/ML Solostar Pen Inject 50 Units into the skin daily. 1.5 mL 0   ipratropium-albuterol (DUONEB) 0.5-2.5 (3) MG/3ML SOLN Take 3 mLs by nebulization every 6 (six) hours as needed. 360 mL 3  No facility-administered medications prior to visit.    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) 12/12/2021   Sciatica    Trigger finger of left thumb 07/08/2019   Type 2 diabetes mellitus with retinopathy, with long-term current use of insulin (HCC) 06/01/2017    Past Surgical History:  Procedure Laterality Date   CHOLECYSTECTOMY  09/08/1994   DILATION AND CURETTAGE OF UTERUS     secondary to menorrhagia   EYE SURGERY  2021   Cataract temoval   KNEE ARTHROSCOPY Left 09/08/2006   TONSILLECTOMY     as a child   WISDOM TOOTH EXTRACTION      Allergies  Allergen Reactions   Empagliflozin Other (See Comments)    Causes yeast infection   Empagliflozin-Linagliptin Other (See Comments)    Causes yeast infection   Other Nausea Only    States she can't tolerate seafood smell        Objective:    Physical Exam Vitals and nursing note reviewed.  Constitutional:      Appearance: Normal appearance. She is morbidly obese.  Cardiovascular:     Rate and Rhythm: Normal rate and regular rhythm.  Pulmonary:     Effort: Pulmonary effort is normal.     Breath sounds: Normal breath sounds.  Musculoskeletal:        General: Normal range of motion.  Skin:    General: Skin is warm and dry.  Neurological:     Mental Status: She is alert.  Psychiatric:        Mood and Affect: Mood normal.        Behavior: Behavior normal.     BP 100/72 (BP Location: Left Arm, Patient Position: Sitting, Cuff Size: Large)   Pulse 83   Temp 98.1 F (36.7 C) (Temporal)   Ht 5\' 4"  (1.626 m)   Wt (!) 313 lb 6 oz (142.1 kg)   LMP 03/28/2018   SpO2 94%   BMI 53.79 kg/m  Wt Readings from Last 3 Encounters:  03/12/22 (!) 313 lb 6 oz (142.1 kg)  02/25/22 (!) 314 lb (142.4 kg)  12/12/21 (!) 315 lb 8 oz (143.1 kg)    Meds ordered this encounter  Medications   metFORMIN (GLUMETZA) 1000 MG (MOD) 24 hr tablet    Sig: Take 1 tablet (1,000 mg total) by mouth 2 (two) times daily with a meal.    Dispense:  180 tablet    Refill:  1   ondansetron (ZOFRAN) 4 MG tablet    Sig: Take 1 tablet (4 mg total) by mouth every 6 (six) hours.    Dispense:  12 tablet    Refill:  0   simvastatin (ZOCOR) 20 MG tablet    Sig: Take 1 tablet (  20 mg total) by mouth at bedtime.    Dispense:  90 tablet    Refill:  0   lisinopril (ZESTRIL) 5 MG tablet    Sig: Take 1 tablet (5 mg total) by mouth daily.    Dispense:  90 tablet    Refill:  0   Semaglutide, 2 MG/DOSE, 8 MG/3ML SOPN    Sig: Inject 2 mg as directed once a week.    Dispense:  9 mL    Refill:  0    Order Specific Question:   Supervising Provider    Answer:   ANDY, CAMILLE L [[id number]]   insulin glargine, 1 Unit Dial, (TOUJEO SOLOSTAR) 300 UNIT/ML Solostar Pen    Sig: Inject 64 Units into the skin daily.    Dispense:  6 mL    Refill:  3    Order  Specific Question:   Supervising Provider    Answer:   ANDY, CAMILLE L [[id number]]    Dulce Sellar, NP

## 2022-03-13 ENCOUNTER — Ambulatory Visit: Payer: 59 | Admitting: Family

## 2022-03-20 MED ORDER — METFORMIN HCL ER 500 MG PO TB24: 1000.0000 mg | ORAL_TABLET | Freq: Two times a day (BID) | ORAL | 1 refills | Status: DC

## 2022-04-17 ENCOUNTER — Other Ambulatory Visit: Payer: Self-pay | Admitting: Family

## 2022-04-17 DIAGNOSIS — E1140 Type 2 diabetes mellitus with diabetic neuropathy, unspecified: Secondary | ICD-10-CM

## 2022-04-17 MED ORDER — TOUJEO SOLOSTAR 300 UNIT/ML ~~LOC~~ SOPN: 64.0000 [IU] | PEN_INJECTOR | Freq: Every day | SUBCUTANEOUS | 3 refills | Status: DC

## 2022-04-17 NOTE — Telephone Encounter (Signed)
Lvm letting pt know her insulin was sent in a larger quantity.

## 2022-04-17 NOTE — Telephone Encounter (Signed)
Let her know I sent a larger quantity again, thanks

## 2022-05-02 ENCOUNTER — Telehealth (HOSPITAL_BASED_OUTPATIENT_CLINIC_OR_DEPARTMENT_OTHER): Payer: Self-pay | Admitting: Emergency Medicine

## 2022-05-02 ENCOUNTER — Other Ambulatory Visit: Payer: Self-pay

## 2022-05-02 ENCOUNTER — Other Ambulatory Visit (HOSPITAL_BASED_OUTPATIENT_CLINIC_OR_DEPARTMENT_OTHER): Payer: Self-pay

## 2022-05-02 ENCOUNTER — Emergency Department (HOSPITAL_BASED_OUTPATIENT_CLINIC_OR_DEPARTMENT_OTHER)
Admission: EM | Admit: 2022-05-02 | Discharge: 2022-05-02 | Disposition: A | Discharge status: Home / Self Care | Payer: 59 | Attending: Emergency Medicine | Admitting: Emergency Medicine

## 2022-05-02 ENCOUNTER — Encounter (HOSPITAL_BASED_OUTPATIENT_CLINIC_OR_DEPARTMENT_OTHER): Payer: Self-pay | Admitting: *Deleted

## 2022-05-02 DIAGNOSIS — R059 Cough, unspecified: Secondary | ICD-10-CM | POA: Diagnosis present

## 2022-05-02 DIAGNOSIS — U071 COVID-19: Secondary | ICD-10-CM | POA: Diagnosis not present

## 2022-05-02 DIAGNOSIS — E1140 Type 2 diabetes mellitus with diabetic neuropathy, unspecified: Secondary | ICD-10-CM | POA: Diagnosis not present

## 2022-05-02 DIAGNOSIS — R112 Nausea with vomiting, unspecified: Secondary | ICD-10-CM | POA: Insufficient documentation

## 2022-05-02 DIAGNOSIS — Z794 Long term (current) use of insulin: Secondary | ICD-10-CM | POA: Insufficient documentation

## 2022-05-02 DIAGNOSIS — I10 Essential (primary) hypertension: Secondary | ICD-10-CM | POA: Diagnosis not present

## 2022-05-02 LAB — BASIC METABOLIC PANEL
Anion gap: 10 (ref 5–15)
BUN: 7 mg/dL (ref 6–20)
CO2: 25 mmol/L (ref 22–32)
Calcium: 8.9 mg/dL (ref 8.9–10.3)
Chloride: 102 mmol/L (ref 98–111)
Creatinine, Ser: 0.78 mg/dL (ref 0.44–1.00)
GFR, Estimated: >60 mL/min (ref >60)
Glucose, Bld: 136 mg/dL — ABNORMAL HIGH (ref 70–99)
Potassium: 3.5 mmol/L (ref 3.5–5.1)
Sodium: 137 mmol/L (ref 135–145)

## 2022-05-02 LAB — SARS CORONAVIRUS 2 BY RT PCR: SARS Coronavirus 2 by RT PCR: POSITIVE — AB

## 2022-05-02 MED ORDER — MOLNUPIRAVIR EUA 200MG CAPSULE: 4.0000 | ORAL_CAPSULE | Freq: Two times a day (BID) | ORAL | 0 refills | Status: AC

## 2022-05-02 MED ORDER — NIRMATRELVIR/RITONAVIR (PAXLOVID)TABLET
3.0000 | ORAL_TABLET | Freq: Two times a day (BID) | ORAL | 0 refills | Status: DC
  Filled 2022-05-02: qty 30, 5d supply, fill #0

## 2022-05-02 MED ORDER — NIRMATRELVIR/RITONAVIR (PAXLOVID)TABLET: 3.0000 | ORAL_TABLET | Freq: Two times a day (BID) | ORAL | 0 refills | Status: DC

## 2022-05-02 MED ORDER — NIRMATRELVIR/RITONAVIR (PAXLOVID)TABLET
3.0000 | ORAL_TABLET | Freq: Two times a day (BID) | ORAL | Status: DC
Start: 2022-05-02 — End: 2022-05-02

## 2022-05-02 MED ORDER — ONDANSETRON 4 MG PO TBDP: 4.0000 mg | ORAL_TABLET | Freq: Three times a day (TID) | ORAL | 0 refills | Status: DC | PRN

## 2022-05-02 MED ORDER — ONDANSETRON 4 MG PO TBDP
4.0000 mg | ORAL_TABLET | Freq: Once | ORAL | Status: AC
Start: 2022-05-02 — End: 2022-05-02
  Administered 2022-05-02: 4 mg via ORAL
  Filled 2022-05-02: qty 1

## 2022-05-02 MED ORDER — ONDANSETRON 4 MG PO TBDP
4.0000 mg | ORAL_TABLET | Freq: Three times a day (TID) | ORAL | 0 refills | Status: DC | PRN
  Filled 2022-05-02: qty 20, 7d supply, fill #0

## 2022-05-02 NOTE — ED Triage Notes (Signed)
Pt states that she has been feeling unwell for 2-3 days and took a home covid test an hour ago which was positive.  Pt is in no distress at this time, no sob.  Pt would like to be evaluated as she is worried that she is high risk citing her diabetes and obesity.

## 2022-05-02 NOTE — Telephone Encounter (Signed)
Pharmacy requests antiviral changed to [email or web address]

## 2022-05-02 NOTE — Discharge Instructions (Signed)
You are seen in the emergency room today with COVID-19.  I am starting you on antiviral medication along with Zofran for nausea and vomiting.  There is an interaction between Paxlovid and your Simvastatin (Zocor).  While taking Paxlovid, please discontinue your Zocor and you can restart in 6 days time.  Please follow-up with your primary care physician by video intermittent quarantine for the next 5 days.  If you develop any new or suddenly worsening symptoms please return for reevaluation.

## 2022-05-02 NOTE — ED Provider Notes (Signed)
Emergency Department Provider Note   I have reviewed the triage vital signs and the nursing notes.   HISTORY  Chief Complaint Covid Positive   HPI Alicia Lucero is a 61 y.o. female with past history of diabetes, hyperlipidemia, elevated BMI presents to the emergency department for evaluation of cough/congestion symptoms and positive home COVID test.  Patient has had 2 to 3 days of symptoms which have been moderate in severity.  No shortness of breath or chest discomfort.  She has had nausea and vomiting over the past several days which has been slightly more severe.  She has been titrating up her Ozempic and so is unsure if vomiting is related to this or COVID.  No abdominal pain.  Patient notes some body aches along with subjective fever. No prior history of COVID infection.     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 ENT: Positive sore throat and nasal congestion.  Cardiovascular: Denies chest pain. Respiratory: Denies shortness of breath. Positive cough.  Gastrointestinal: No abdominal pain.  Positive nausea and vomiting.  No diarrhea.  No  constipation. Genitourinary: Negative for dysuria. Musculoskeletal: Negative for back pain. Skin: Negative for rash. Neurological: Negative for headaches, focal weakness or numbness.  ____________________________________________   PHYSICAL EXAM:  VITAL SIGNS: ED Triage Vitals  Enc Vitals Group     BP 05/02/22 0433 (!) 145/66     Pulse Rate 05/02/22 0433 87     Resp 05/02/22 0433 16     Temp 05/02/22 0433 98.6 F (37 C)     Temp src --      SpO2 05/02/22 0433 95 %     Weight 05/02/22 0434 (!) 317 lb (143.8 kg)     Height 05/02/22 0434 5\' 5"  (1.651 m)   Constitutional: Alert and oriented. Well appearing and in no acute distress. Eyes: Conjunctivae are normal.  Head: Atraumatic. Nose: No congestion/rhinnorhea. Mouth/Throat: Mucous membranes are moist.   Neck: No stridor.  Cardiovascular: Normal rate, regular rhythm. Good peripheral circulation. Grossly normal heart sounds.   Respiratory: Normal respiratory effort.  No retractions. Lungs CTABL with only trace end-expiratory wheeze occasionally.  Gastrointestinal: Soft and nontender. No distention.  Musculoskeletal: No lower extremity tenderness nor edema. No gross deformities of extremities. Neurologic:  Normal speech and language. No gross focal neurologic deficits are appreciated.  Skin:  Skin is warm, dry and intact. No rash noted.  ____________________________________________   LABS (all labs ordered are listed, but only abnormal results are displayed)  Labs Reviewed  SARS CORONAVIRUS 2 BY RT PCR - Abnormal; Notable for the following components:      Result Value   SARS Coronavirus 2 by RT PCR POSITIVE (*)    All other components within normal limits  BASIC METABOLIC PANEL - Abnormal; Notable for the following components:   Glucose, Bld 136 (*)    All other components within normal limits    ____________________________________________   PROCEDURES  Procedure(s) performed:   Procedures  None   ____________________________________________   INITIAL IMPRESSION / ASSESSMENT AND PLAN / ED COURSE  Pertinent labs & imaging results that were available during my care of the patient were reviewed by me and considered in my medical decision making (see chart for details).   This patient is Presenting for Evaluation of cough/congestion/vomiting, which does require a range of treatment options, and is a complaint that involves a high risk of morbidity and mortality.  The Differential Diagnoses include COVID, Flu, CAP, Bronchitis, GI viral infection, medication side effect, etc.    I decided to review pertinent External Data, and in summary patient with normal kidney function in March 2023.    Clinical Laboratory Tests Ordered, included COVID-positive.  Creatinine normal.  GFR greater than 60.   Radiologic Tests: Considered CXR but exam and vitals including O2 sat are reassuring. Patient is very well appearing. Plan for admit.   Cardiac Monitor Tracing which shows NSR.   Social Determinants of Health Risk patient is a non-smoker.   Medical Decision Making: Summary:  Patient presents emergency department tested positive for COVID at home.  He is very well-appearing and not hypoxemic.  She does have risk factors for developing severe disease and would be in the timeframe to benefit from Paxlovid.  Abdomen is diffusely soft and nontender.  Suspect that her associated vomiting is related to either COVID infection or home medication side effect.  Considered abdominal imaging but exam is reassuring.  Plan to check kidney function with BMP and confirm COVID with PCR.  Reevaluation with update and discussion with patient.  Her COVID test is positive.  Normal GFR.  Plan for Paxlovid and Zofran which was sent to her pharmacy of choice.  Discussed quarantine, PCP follow-up, ED return precaution.  Considered admission but patient is not hypoxemic.  She is looking well and appears stable for  management at home with antivirals.  Disposition: discharge  ____________________________________________  FINAL CLINICAL IMPRESSION(S) / ED DIAGNOSES  Final diagnoses:  COVID-19  Nausea and vomiting, unspecified vomiting type     NEW OUTPATIENT MEDICATIONS STARTED DURING THIS VISIT:  New Prescriptions   NIRMATRELVIR/RITONAVIR EUA (PAXLOVID) 20 X 150 MG & 10 X 100MG  TABS    Take 3 tablets by mouth 2 (two) times daily for 5 days. Patient GFR is 60. Take nirmatrelvir (150 mg) two tablets twice daily for 5 days and ritonavir (100 mg) one tablet twice daily for 5 days.   ONDANSETRON (ZOFRAN-ODT) 4 MG DISINTEGRATING TABLET    Take 1 tablet (4 mg total) by mouth every 8 (eight) hours as needed.    Note:  This document was prepared using Dragon voice recognition software and may include unintentional dictation errors.  , MD, [facility] Medicine    Rasheida Broden, [facility], MD 05/02/22 [phone]

## 2022-05-14 ENCOUNTER — Ambulatory Visit: Payer: 59 | Admitting: Family

## 2022-05-14 NOTE — Progress Notes (Deleted)
Patient ID: Alicia Lucero, female    DOB: 01-31-1961, 61 y.o.   MRN: [id number]  No chief complaint on file.   HPI:   Assessment & Plan:   Problem List Items Addressed This Visit   None   Subjective:    Outpatient Medications Prior to Visit  Medication Sig Dispense Refill   ACCU-CHEK AVIVA PLUS test strip USE AS DIRECTED 100 each 0   ACCU-CHEK SOFTCLIX LANCETS lancets USE AS DIRECTED 100 each 1   albuterol (VENTOLIN HFA) 108 (90 Base) MCG/ACT inhaler inhale 2 puffs by mouth every 4 hours if needed for cough wheezing or shortness of breath 18 g 1   Cholecalciferol (VITAMIN D) 2000 units tablet Take 1 tablet (2,000 Units total) by mouth daily. 30 tablet 0   clonazePAM (KLONOPIN) 1 MG tablet Take 1 tablet (1 mg total) by mouth 2 (two) times daily as needed. (Patient taking differently: Take 1 mg by mouth 3 (three) times daily as needed for anxiety.) 60 tablet 2   esomeprazole (NEXIUM) 40 MG capsule Take by mouth.     gabapentin (NEURONTIN) 600 MG tablet Take 600 mg by mouth 2 (two) times daily.  0   GLUCOSA-CHONDR-NA CHONDR-MSM PO Take by mouth.     insulin glargine, 1 Unit Dial, (TOUJEO SOLOSTAR) 300 UNIT/ML Solostar Pen Inject 64 Units into the skin daily. 24 mL 3   Insulin Pen Needle (B-D ULTRAFINE III SHORT PEN) 31G X 8 MM MISC daily 100 each 3   ketoconazole (NIZORAL) 2 % cream Apply 1 application topically 2 (two) times daily. 60 g 0   lamoTRIgine (LAMICTAL) 200 MG tablet Take 2 tablets (400 mg total) by mouth every evening. 180 tablet 1   lisdexamfetamine (VYVANSE) 60 MG capsule Take 60 mg by mouth daily.      lisinopril (ZESTRIL) 5 MG tablet Take 1 tablet (5 mg total) by mouth daily. 90 tablet 0   metFORMIN (GLUCOPHAGE-XR) 500 MG 24 hr tablet Take 2 tablets (1,000 mg total) by mouth 2 (two) times daily after a meal. 180 tablet 1   Multiple Vitamins-Minerals (WOMENS MULTIVITAMIN PLUS PO) Take 1 tablet by mouth daily.      ondansetron (ZOFRAN) 4 MG tablet TAKE 1 TABLET(4 MG) BY  MOUTH EVERY 6 HOURS 12 tablet 0   ondansetron (ZOFRAN-ODT) 4 MG disintegrating tablet Take 1 tablet (4 mg total) by mouth every 8 (eight) hours as needed. 20 tablet 0   REXULTI 1 MG TABS tablet Take 1 mg by mouth daily.     Semaglutide, 2 MG/DOSE, 8 MG/3ML SOPN Inject 2 mg as directed once a week. 9 mL 0   sertraline (ZOLOFT) 100 MG tablet Take 2 tablets (200 mg total) by mouth daily. 180 tablet 1   simvastatin (ZOCOR) 20 MG tablet Take 1 tablet (20 mg total) by mouth at bedtime. 90 tablet 0   TRINTELLIX 5 MG TABS tablet Take 15 mg by mouth daily.   0   No facility-administered medications prior to visit.   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) 12/12/2021   Sciatica    Trigger finger of left thumb 07/08/2019   Type 2 diabetes mellitus with retinopathy, with long-term current use of insulin (HCC) 06/01/2017   Past Surgical History:  Procedure Laterality Date   CHOLECYSTECTOMY  09/08/1994   DILATION AND CURETTAGE OF UTERUS     secondary to menorrhagia   EYE SURGERY  2021   Cataract temoval   KNEE ARTHROSCOPY Left 09/08/2006   TONSILLECTOMY     as a child   WISDOM TOOTH EXTRACTION     Allergies  Allergen Reactions   Empagliflozin Other (See Comments)    Causes yeast infection   Empagliflozin-Linagliptin Other (See Comments)    Causes yeast infection   Other Nausea Only    States she can't  tolerate seafood smell      Objective:    Physical Exam Vitals and nursing note reviewed.  Constitutional:      Appearance: Normal appearance.  Cardiovascular:     Rate and Rhythm: Normal rate and regular rhythm.  Pulmonary:     Effort: Pulmonary effort is normal.     Breath sounds: Normal breath sounds.  Musculoskeletal:        General: Normal range of motion.  Skin:    General: Skin is warm and dry.  Neurological:     Mental Status: She is alert.  Psychiatric:        Mood and Affect: Mood normal.        Behavior: Behavior normal.    LMP 03/28/2018  Wt Readings from Last 3 Encounters:  05/02/22 (!) 317 lb (143.8 kg)  03/12/22 (!) 313 lb 6 oz (142.1 kg)  02/25/22 (!) 314 lb (142.4 kg)       Dulce Sellar, NP

## 2022-05-23 ENCOUNTER — Ambulatory Visit: Payer: 59

## 2022-05-23 LAB — POCT GLYCOSYLATED HEMOGLOBIN (HGB A1C): Hemoglobin-A1c: 6.7

## 2022-05-29 ENCOUNTER — Encounter: Payer: Self-pay | Admitting: Family

## 2022-06-02 ENCOUNTER — Encounter: Payer: Self-pay | Admitting: *Deleted

## 2022-06-02 NOTE — Telephone Encounter (Signed)
I called and LVM for pt. There is no insurance card on file and [employer] does not cover patients any longer.

## 2022-06-16 ENCOUNTER — Other Ambulatory Visit: Payer: Self-pay | Admitting: Family

## 2022-06-16 DIAGNOSIS — Z794 Long term (current) use of insulin: Secondary | ICD-10-CM

## 2022-07-03 ENCOUNTER — Other Ambulatory Visit: Payer: Self-pay | Admitting: Family

## 2022-07-03 DIAGNOSIS — E1140 Type 2 diabetes mellitus with diabetic neuropathy, unspecified: Secondary | ICD-10-CM

## 2022-07-03 MED ORDER — INSULIN GLARGINE 100 UNIT/ML SOLOSTAR PEN: 64.0000 [IU] | PEN_INJECTOR | Freq: Every day | SUBCUTANEOUS | 3 refills | Status: AC

## 2022-07-03 NOTE — Telephone Encounter (Signed)
Let Nautika know I sent in the insulin glargine which is generic Lantus to the [facility] on [address]. - remind her to show her new insurance card

## 2022-07-04 NOTE — Telephone Encounter (Signed)
I called pt and LVM

## 2022-07-27 ENCOUNTER — Other Ambulatory Visit: Payer: Self-pay | Admitting: Family

## 2022-07-27 DIAGNOSIS — E1140 Type 2 diabetes mellitus with diabetic neuropathy, unspecified: Secondary | ICD-10-CM

## 2022-09-09 DIAGNOSIS — F3181 Bipolar II disorder: Secondary | ICD-10-CM | POA: Diagnosis not present

## 2022-09-09 DIAGNOSIS — F419 Anxiety disorder, unspecified: Secondary | ICD-10-CM | POA: Diagnosis not present

## 2022-09-17 ENCOUNTER — Other Ambulatory Visit: Payer: Self-pay | Admitting: Family

## 2022-09-17 DIAGNOSIS — E1140 Type 2 diabetes mellitus with diabetic neuropathy, unspecified: Secondary | ICD-10-CM

## 2022-09-26 DIAGNOSIS — F3181 Bipolar II disorder: Secondary | ICD-10-CM | POA: Diagnosis not present

## 2022-09-30 DIAGNOSIS — F3181 Bipolar II disorder: Secondary | ICD-10-CM | POA: Diagnosis not present

## 2022-09-30 DIAGNOSIS — F419 Anxiety disorder, unspecified: Secondary | ICD-10-CM | POA: Diagnosis not present

## 2022-10-06 DIAGNOSIS — F3181 Bipolar II disorder: Secondary | ICD-10-CM | POA: Diagnosis not present

## 2022-10-06 DIAGNOSIS — F419 Anxiety disorder, unspecified: Secondary | ICD-10-CM | POA: Diagnosis not present

## 2022-10-13 DIAGNOSIS — F419 Anxiety disorder, unspecified: Secondary | ICD-10-CM | POA: Diagnosis not present

## 2022-10-13 DIAGNOSIS — F3181 Bipolar II disorder: Secondary | ICD-10-CM | POA: Diagnosis not present

## 2022-10-29 DIAGNOSIS — M170 Bilateral primary osteoarthritis of knee: Secondary | ICD-10-CM | POA: Diagnosis not present

## 2022-11-10 DIAGNOSIS — F3181 Bipolar II disorder: Secondary | ICD-10-CM | POA: Diagnosis not present

## 2022-12-03 ENCOUNTER — Ambulatory Visit: Payer: Self-pay | Admitting: Family Medicine

## 2022-12-17 DIAGNOSIS — F3181 Bipolar II disorder: Secondary | ICD-10-CM | POA: Diagnosis not present

## 2022-12-17 DIAGNOSIS — F419 Anxiety disorder, unspecified: Secondary | ICD-10-CM | POA: Diagnosis not present

## 2022-12-19 ENCOUNTER — Other Ambulatory Visit: Payer: Self-pay | Admitting: Family

## 2022-12-19 DIAGNOSIS — E1140 Type 2 diabetes mellitus with diabetic neuropathy, unspecified: Secondary | ICD-10-CM

## 2022-12-22 ENCOUNTER — Other Ambulatory Visit: Payer: Self-pay | Admitting: Family

## 2022-12-22 DIAGNOSIS — Z794 Long term (current) use of insulin: Secondary | ICD-10-CM

## 2022-12-22 DIAGNOSIS — F3181 Bipolar II disorder: Secondary | ICD-10-CM | POA: Diagnosis not present

## 2023-01-05 ENCOUNTER — Ambulatory Visit (INDEPENDENT_AMBULATORY_CARE_PROVIDER_SITE_OTHER): Payer: Medicaid Other | Admitting: Family

## 2023-01-05 ENCOUNTER — Encounter: Payer: Self-pay | Admitting: Family

## 2023-01-05 VITALS — BP 101/71 | HR 83 | Temp 97.8°F | Ht 65.0 in | Wt 321.4 lb

## 2023-01-05 DIAGNOSIS — Z1231 Encounter for screening mammogram for malignant neoplasm of breast: Secondary | ICD-10-CM | POA: Diagnosis not present

## 2023-01-05 DIAGNOSIS — S8991XA Unspecified injury of right lower leg, initial encounter: Secondary | ICD-10-CM | POA: Diagnosis not present

## 2023-01-05 DIAGNOSIS — S8992XA Unspecified injury of left lower leg, initial encounter: Secondary | ICD-10-CM

## 2023-01-05 DIAGNOSIS — Z0000 Encounter for general adult medical examination without abnormal findings: Secondary | ICD-10-CM | POA: Diagnosis not present

## 2023-01-05 DIAGNOSIS — S8990XA Unspecified injury of unspecified lower leg, initial encounter: Secondary | ICD-10-CM

## 2023-01-05 NOTE — Progress Notes (Unsigned)
Phone [phone]  Subjective:   Patient is a 62 y.o. female presenting for annual physical.    Chief Complaint  Patient presents with   Annual Exam    Not fasting w/ labs    Fall    Pt states she fell on Thursday on both knees.    HPI: Fall:  pt reports she fell against her knees while climbing out of a pool against the ladder, she missed her step, and her knees slipped and hit against the ladder as she fell back into the water. She reports pain on the front of both knees around the knee cap with mild swelling, no bruising, and has not prevented her from walking.   See problem oriented charting- ROS- full  review of systems was completed and negative except for: noted in HPI above.  The following were reviewed and entered/updated in epic: 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) 12/12/2021   Sciatica    Trigger finger of left thumb 07/08/2019   Type 2 diabetes mellitus with retinopathy, with long-term current use of insulin (HCC) 06/01/2017   Patient Active Problem List   Diagnosis Date Noted   Hypertension associated with diabetes (HCC) 03/12/2022   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) 12/12/2021   Diverticular disease of colon 10/31/2021   Family history of colon cancer 10/31/2021   Sciatica 10/31/2021   Tommi Rumps Quervain's disease (radial styloid tenosynovitis) 07/08/2019    Gastroesophageal reflux disease without esophagitis 10/27/2018   PCOS (polycystic ovarian syndrome) 10/27/2018   Diabetic peripheral neuropathy (HCC) 10/27/2018   Binge eating disorder 10/12/2017   Vitamin D deficiency 06/01/2017   Bipolar 2 disorder (HCC) 06/01/2017   Primary osteoarthritis of left knee 09/08/2016   Type 2 diabetes mellitus with diabetic neuropathy, with long-term current use of insulin (HCC) 07/19/2015   Dyslipidemia associated with type 2 diabetes mellitus (HCC) 07/19/2015   Morbid obesity with BMI of 50.0-59.9, adult (HCC) 12/26/2014   Asthma, chronic 09/21/2013   Past Surgical History:  Procedure Laterality Date   CHOLECYSTECTOMY  09/08/1994   DILATION AND CURETTAGE OF UTERUS     secondary to menorrhagia   EYE SURGERY  2021   Cataract temoval   KNEE ARTHROSCOPY Left 09/08/2006   TONSILLECTOMY     as a child   WISDOM TOOTH EXTRACTION      Family History  Problem Relation Age of Onset   Cancer Mother        lung   Mental illness Mother        bipolar mania   Hypertension Mother    Depression Mother    Early death Mother    Cancer Father        liver   Hypertension Father  Heart disease Father        CAD/CABG   Arthritis Father    Multiple sclerosis Sister    Hypertension Sister    Diabetes Sister     Medications- reviewed and updated Current Outpatient Medications  Medication Sig Dispense Refill   ACCU-CHEK AVIVA PLUS test strip USE AS DIRECTED 100 each 0   ACCU-CHEK SOFTCLIX LANCETS lancets USE AS DIRECTED 100 each 1   albuterol (VENTOLIN HFA) 108 (90 Base) MCG/ACT inhaler inhale 2 puffs by mouth every 4 hours if needed for cough wheezing or shortness of breath 18 g 1   atomoxetine (STRATTERA) 80 MG capsule Take 80 mg by mouth daily.     Cholecalciferol (VITAMIN D) 2000 units tablet Take 1 tablet (2,000 Units total) by mouth daily. 30 tablet 0   esomeprazole (NEXIUM) 40 MG capsule Take by mouth.     gabapentin (NEURONTIN) 600 MG tablet Take  600 mg by mouth 2 (two) times daily.  0   GLUCOSA-CHONDR-NA CHONDR-MSM PO Take by mouth.     insulin glargine (LANTUS) 100 UNIT/ML Solostar Pen Inject 64 Units into the skin daily. 15 mL 3   Insulin Pen Needle (B-D ULTRAFINE III SHORT PEN) 31G X 8 MM MISC daily 100 each 3   ketoconazole (NIZORAL) 2 % cream Apply 1 application topically 2 (two) times daily. 60 g 0   lamoTRIgine (LAMICTAL) 200 MG tablet Take 2 tablets (400 mg total) by mouth every evening. 180 tablet 1   lisinopril (ZESTRIL) 5 MG tablet TAKE 1 TABLET(5 MG) BY MOUTH DAILY 90 tablet 0   metFORMIN (GLUCOPHAGE-XR) 500 MG 24 hr tablet TAKE 2 TABLETS(1000 MG) BY MOUTH TWICE DAILY AFTER A MEAL 180 tablet 1   Multiple Vitamins-Minerals (WOMENS MULTIVITAMIN PLUS PO) Take 1 tablet by mouth daily.      ondansetron (ZOFRAN) 4 MG tablet TAKE 1 TABLET(4 MG) BY MOUTH EVERY 6 HOURS 12 tablet 0   ondansetron (ZOFRAN-ODT) 4 MG disintegrating tablet Take 1 tablet (4 mg total) by mouth every 8 (eight) hours as needed. 20 tablet 0   REXULTI 1 MG TABS tablet Take 1 mg by mouth daily.     Semaglutide, 2 MG/DOSE, 8 MG/3ML SOPN Inject 2 mg as directed once a week. (Patient taking differently: Inject 2 mg as directed once a week. OZEMPIC) 9 mL 0   sertraline (ZOLOFT) 100 MG tablet Take 2 tablets (200 mg total) by mouth daily. 180 tablet 1   simvastatin (ZOCOR) 20 MG tablet TAKE 1 TABLET(20 MG) BY MOUTH AT BEDTIME 90 tablet 0   traZODone (DESYREL) 50 MG tablet Take 25-50 mg by mouth at bedtime as needed for sleep.     No current facility-administered medications for this visit.    Allergies-reviewed and updated Allergies  Allergen Reactions   Empagliflozin Other (See Comments)    Causes yeast infection   Empagliflozin-Linagliptin Other (See Comments)    Causes yeast infection   Other Nausea Only    States she can't tolerate seafood smell    Social History   Social History Narrative   Patient lives at home with her spouse.   Caffeine Use: 5-7  caffeine drinks daily    Objective:  BP 101/71 (BP Location: Left Arm, Patient Position: Sitting, Cuff Size: Large)   Pulse 83   Temp 97.8 F (36.6 C) (Temporal)   Ht 5\' 5"  (1.651 m)   Wt (!) 321 lb 6 oz (145.8 kg)   LMP 03/28/2018   SpO2 97%   BMI  53.48 kg/m  Physical Exam Vitals and nursing note reviewed.  Constitutional:      Appearance: Normal appearance.  HENT:     Head: Normocephalic.     Right Ear: Tympanic membrane normal.     Left Ear: Tympanic membrane normal.     Nose: Nose normal.     Mouth/Throat:     Mouth: Mucous membranes are moist.  Eyes:     Pupils: Pupils are equal, round, and reactive to light.  Cardiovascular:     Rate and Rhythm: Normal rate and regular rhythm.  Pulmonary:     Effort: Pulmonary effort is normal.     Breath sounds: Normal breath sounds.  Musculoskeletal:        General: Normal range of motion.     Cervical back: Normal range of motion.  Lymphadenopathy:     Cervical: No cervical adenopathy.  Skin:    General: Skin is warm and dry.  Neurological:     Mental Status: She is alert.  Psychiatric:        Mood and Affect: Mood normal.        Behavior: Behavior normal.     Assessment and Plan   Health Maintenance counseling: 1. Anticipatory guidance: Patient counseled regarding regular dental exams q6 months, eye exams,  avoiding smoking and second hand smoke, limiting alcohol to 1 beverage per day, no illicit drugs.   2. Risk factor reduction:  Advised patient of need for regular exercise and diet rich with fruits and vegetables to reduce risk of heart attack and stroke. Exercise- working at a senior center .  Wt Readings from Last 3 Encounters:  01/05/23 (!) 321 lb 6 oz (145.8 kg)  05/02/22 (!) 317 lb (143.8 kg)  03/12/22 (!) 313 lb 6 oz (142.1 kg)   3. Immunizations/screenings/ancillary studies Immunization History  Administered Date(s) Administered   Influenza Inj Mdck Quad Pf 05/30/2022   Influenza Split 05/09/2013,  07/13/2018   Influenza,inj,Quad PF,6+ Mos 06/09/2013, 07/20/2014, 07/21/2015, 06/23/2019   Influenza-Unspecified 05/09/2013, 06/09/2013, 07/20/2014, 07/21/2015, 06/08/2016   Moderna Sars-Covid-2 Vaccination 10/13/2019, 11/10/2019   Pfizer Covid-19 Vaccine Bivalent Booster 14yrs & up 07/16/2021   Pneumococcal Polysaccharide-23 07/21/2015   Pneumococcal-Unspecified 01/06/2009   Tdap 01/13/2009   Health Maintenance Due  Topic Date Due   COLONOSCOPY (Pts 45-20yrs Insurance coverage will need to be confirmed)  Never done   Zoster Vaccines- Shingrix (1 of 2) Never done   FOOT EXAM  12/24/2017   Diabetic kidney evaluation - Urine ACR  05/18/2018   MAMMOGRAM  05/28/2018   DTaP/Tdap/Td (2 - Td or Tdap) 01/14/2019   PAP SMEAR-Modifier  04/14/2021   OPHTHALMOLOGY EXAM  08/15/2022   HEMOGLOBIN A1C  11/21/2022    4. Cervical cancer screening- 4-5 years ago, normal 5. Breast cancer screening-  mammogram ordering today 6. Colon cancer screening - Cologuard due in 2 years  7. Skin cancer screening- advised regular sunscreen use. Denies worrisome, changing, or new skin lesions.  8. Birth control/STD check- postmenopausal 9. Osteoporosis screening- N/A 10. Alcohol screening: none 11. Smoking associated screening (lung cancer screening, AAA screen 65-75, UA)- non- smoker  Annual physical exam -     CBC with Differential/Platelet -     Comprehensive metabolic panel -     Lipid panel -     TSH  Screening mammogram for breast cancer -     Digital Screening Mammogram, Left and Right; Future  Knee injury, unspecified laterality, initial encounter - no bruising or difficulty with bending knees  or walking. pt is seeing her orthopedic this week and will have her knees evaluated.   Recommended follow up:  No follow-ups on file. No future appointments.   Lab/Order associations: NOT-fasting   Dulce Sellar, NP

## 2023-01-05 NOTE — Patient Instructions (Signed)
It was very nice to see you today!   I will review your lab results via MyChart in a few days.  Ask your Endocrinologist about switching to [facility].     PLEASE NOTE:  If you had any lab tests please let us know if you have not heard back within a few days. You may see your results on MyChart before we have a chance to review them but we will give you a call once they are reviewed by [place]. If we ordered any referrals today, please let us know if you have not heard from their office within the next week.

## 2023-01-06 LAB — CBC WITH DIFFERENTIAL/PLATELET
Basophils Absolute: 0.1 10*3/uL (ref 0.0–0.1)
Basophils Relative: 1.2 % (ref 0.0–3.0)
Eosinophils Absolute: 0.7 10*3/uL (ref 0.0–0.7)
Eosinophils Relative: 6.1 % — ABNORMAL HIGH (ref 0.0–5.0)
HCT: 42.3 % (ref 36.0–46.0)
Hemoglobin: 13.8 g/dL (ref 12.0–15.0)
Lymphocytes Relative: 20.5 % (ref 12.0–46.0)
Lymphs Abs: 2.4 10*3/uL (ref 0.7–4.0)
MCHC: 32.6 g/dL (ref 30.0–36.0)
MCV: 86.7 fl (ref 78.0–100.0)
Monocytes Absolute: 0.7 10*3/uL (ref 0.1–1.0)
Monocytes Relative: 6 % (ref 3.0–12.0)
Neutro Abs: 7.7 10*3/uL (ref 1.4–7.7)
Neutrophils Relative %: 66.2 % (ref 43.0–77.0)
Platelets: 345 10*3/uL (ref 150.0–400.0)
RBC: 4.87 Mil/uL (ref 3.87–5.11)
RDW: 15.2 % (ref 11.5–15.5)
WBC: 11.7 10*3/uL — ABNORMAL HIGH (ref 4.0–10.5)

## 2023-01-06 LAB — LIPID PANEL
Cholesterol: 201 mg/dL — ABNORMAL HIGH (ref 0–200)
HDL: 67.4 mg/dL (ref >39.00)
LDL Cholesterol: 104 mg/dL — ABNORMAL HIGH (ref 0–99)
NonHDL: 133.34
Total CHOL/HDL Ratio: 3
Triglycerides: 149 mg/dL (ref 0.0–149.0)
VLDL: 29.8 mg/dL (ref 0.0–40.0)

## 2023-01-06 LAB — COMPREHENSIVE METABOLIC PANEL
ALT: 20 U/L (ref 0–35)
AST: 18 U/L (ref 0–37)
Albumin: 4 g/dL (ref 3.5–5.2)
Alkaline Phosphatase: 93 U/L (ref 39–117)
BUN: 16 mg/dL (ref 6–23)
CO2: 24 mEq/L (ref 19–32)
Calcium: 9.2 mg/dL (ref 8.4–10.5)
Chloride: 104 mEq/L (ref 96–112)
Creatinine, Ser: 0.87 mg/dL (ref 0.40–1.20)
GFR: 72.82 mL/min (ref >60.00)
Glucose, Bld: 110 mg/dL — ABNORMAL HIGH (ref 70–99)
Potassium: 4.1 mEq/L (ref 3.5–5.1)
Sodium: 138 mEq/L (ref 135–145)
Total Bilirubin: 0.3 mg/dL (ref 0.2–1.2)
Total Protein: 6.8 g/dL (ref 6.0–8.3)

## 2023-01-06 LAB — TSH: TSH: 2.66 u[IU]/mL (ref 0.35–5.50)

## 2023-01-07 DIAGNOSIS — W108XXA Fall (on) (from) other stairs and steps, initial encounter: Secondary | ICD-10-CM | POA: Diagnosis not present

## 2023-01-07 DIAGNOSIS — M170 Bilateral primary osteoarthritis of knee: Secondary | ICD-10-CM | POA: Diagnosis not present

## 2023-01-15 ENCOUNTER — Ambulatory Visit
Admission: RE | Admit: 2023-01-15 | Discharge: 2023-01-15 | Disposition: A | Discharge status: Home / Self Care | Payer: Medicaid Other | Source: Ambulatory Visit | Attending: Family | Admitting: Family

## 2023-01-15 DIAGNOSIS — Z1231 Encounter for screening mammogram for malignant neoplasm of breast: Secondary | ICD-10-CM | POA: Diagnosis not present

## 2023-01-19 ENCOUNTER — Other Ambulatory Visit: Payer: Self-pay | Admitting: Family

## 2023-01-19 DIAGNOSIS — F3181 Bipolar II disorder: Secondary | ICD-10-CM | POA: Diagnosis not present

## 2023-01-19 DIAGNOSIS — R928 Other abnormal and inconclusive findings on diagnostic imaging of breast: Secondary | ICD-10-CM

## 2023-02-04 ENCOUNTER — Inpatient Hospital Stay: Admission: RE | Admit: 2023-02-04 | Payer: Medicaid Other | Source: Ambulatory Visit

## 2023-02-12 ENCOUNTER — Other Ambulatory Visit: Payer: Medicaid Other

## 2023-02-23 ENCOUNTER — Ambulatory Visit
Admission: RE | Admit: 2023-02-23 | Discharge: 2023-02-23 | Disposition: A | Discharge status: Home / Self Care | Payer: Medicaid Other | Source: Ambulatory Visit | Attending: Family | Admitting: Family

## 2023-02-23 ENCOUNTER — Other Ambulatory Visit: Payer: Self-pay | Admitting: Family

## 2023-02-23 ENCOUNTER — Ambulatory Visit: Payer: Medicaid Other

## 2023-02-23 DIAGNOSIS — R928 Other abnormal and inconclusive findings on diagnostic imaging of breast: Secondary | ICD-10-CM

## 2023-02-23 DIAGNOSIS — R921 Mammographic calcification found on diagnostic imaging of breast: Secondary | ICD-10-CM

## 2023-02-24 ENCOUNTER — Ambulatory Visit: Payer: Medicaid Other | Admitting: Physician Assistant

## 2023-02-24 LAB — HEMOGLOBIN A1C: Hemoglobin A1C: 6.8

## 2023-02-24 LAB — GLUCOSE, POCT (MANUAL RESULT ENTRY): Glucose Fasting, POC: 130 mg/dL — AB (ref 70–99)

## 2023-02-25 ENCOUNTER — Other Ambulatory Visit (INDEPENDENT_AMBULATORY_CARE_PROVIDER_SITE_OTHER): Payer: Medicaid Other

## 2023-02-25 ENCOUNTER — Ambulatory Visit (INDEPENDENT_AMBULATORY_CARE_PROVIDER_SITE_OTHER): Payer: Medicaid Other | Admitting: Orthopaedic Surgery

## 2023-02-25 DIAGNOSIS — M25511 Pain in right shoulder: Secondary | ICD-10-CM

## 2023-02-25 DIAGNOSIS — M1712 Unilateral primary osteoarthritis, left knee: Secondary | ICD-10-CM

## 2023-02-25 MED ORDER — LIDOCAINE HCL 1 % IJ SOLN
2.0000 mL | INTRAMUSCULAR | Status: AC | PRN
Start: 2023-02-25 — End: 2023-02-25
  Administered 2023-02-25: 2 mL

## 2023-02-25 MED ORDER — BUPIVACAINE HCL 0.5 % IJ SOLN
2.0000 mL | INTRAMUSCULAR | Status: AC | PRN
Start: 2023-02-25 — End: 2023-02-25
  Administered 2023-02-25: 2 mL via INTRA_ARTICULAR

## 2023-02-25 MED ORDER — METHYLPREDNISOLONE ACETATE 40 MG/ML IJ SUSP
40.0000 mg | INTRAMUSCULAR | Status: AC | PRN
Start: 2023-02-25 — End: 2023-02-25
  Administered 2023-02-25: 40 mg via INTRA_ARTICULAR

## 2023-02-25 NOTE — Progress Notes (Signed)
Office Visit Note   Patient: Alicia Lucero           Date of Birth: February 28, 1961           MRN: [id number] Visit Date: 02/25/2023              Requested by: Dulce Sellar, NP [address] [place],  [place] [address] PCP: Dulce Sellar, NP   Assessment & Plan: Visit Diagnoses:  1. Acute pain of right shoulder   2. Primary osteoarthritis of left knee     Plan: Impression is advanced left knee osteoarthritis and right shoulder subacromial bursitis.  We have discussed cortisone injection to both areas, however with her underlying diabetes I would only like to proceed with 1 joint today.  She has elected to proceed with the left knee.  She will follow-up with [place] in 1 to 2 weeks for right shoulder subacromial cortisone injection.  Call with concerns or questions in the meantime.  Follow-Up Instructions: Return if symptoms worsen or fail to improve.   Orders:  Orders Placed This Encounter  Procedures   XR Shoulder Right   No orders of the defined types were placed in this encounter.     Procedures: Large Joint Inj: L knee on 02/25/2023 12:24 PM Details: 22 G needle Medications: 2 mL bupivacaine 0.5 %; 2 mL lidocaine 1 %; 40 mg methylPREDNISolone acetate 40 MG/ML Outcome: tolerated well, no immediate complications Patient was prepped and draped in the usual sterile fashion.       Clinical Data: No additional findings.   Subjective: Chief Complaint  Patient presents with   Left Knee - Pain    Cortisone injection    HPI patient is a pleasant 62 year old female who comes in today with left knee and right shoulder pain.  In regards to her left knee, she has a history of advanced arthritis.  She was seen by [place] in June of last year where she underwent cortisone injection.  Her pain recurred a few months ago.  The pain is constant but worse with walking and twisting her knee.  She is requesting another cortisone injection today.  The other issue she brings up is right  shoulder pain.  Symptoms been ongoing for the past 2 to 3 months.  No known injury.  The pain is to the proximal deltoid.  No weakness.  Symptoms are worse with shrugging her shoulders as well as with internal rotation.  No weakness.  Review of Systems as detailed in HPI.  All others reviewed and are negative.   Objective: Vital Signs: LMP 03/28/2018   Physical Exam well-developed well-nourished female no acute distress.  Alert and oriented x 3.  Ortho Exam left knee exam is unchanged.  Right shoulder exam shows full active range of motion in all planes with the exception of internal rotation for which she can go to her back pocket.  She does have a positive empty can test.  Negative speeds, O'Brien's and bearhug.  Full strength throughout.  She is neurovascularly intact distally.  Specialty Comments:  No specialty comments available.  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) 12/12/2021   Diverticular disease of colon 10/31/2021   Family history of colon cancer 10/31/2021   Sciatica 10/31/2021   Suzette Battiest disease (  radial styloid tenosynovitis) 07/08/2019   Gastroesophageal reflux disease without esophagitis 10/27/2018   PCOS (polycystic ovarian syndrome) 10/27/2018   Diabetic peripheral neuropathy (HCC) 10/27/2018   Binge eating disorder 10/12/2017   Vitamin D deficiency 06/01/2017   Bipolar 2 disorder (HCC) 06/01/2017   Primary osteoarthritis of left knee 09/08/2016   Type 2 diabetes mellitus with diabetic neuropathy, with long-term current use of insulin (HCC) 07/19/2015   Dyslipidemia associated with type 2 diabetes mellitus (HCC) 07/19/2015   Morbid obesity with BMI of 50.0-59.9, adult (HCC) 12/26/2014   Asthma, chronic 09/21/2013   Past  Medical History:  Diagnosis Date   Allergy    Anxiety 1992   Arthritis 2007   Back pain    Benign essential HTN    Bipolar 1 disorder (HCC)    Cataract 2019   Chest pain    Depression    Diabetes mellitus without complication (HCC)    Diabetic peripheral neuropathy (HCC)    Drug use    Dyspnea    Fatty liver    Gallbladder problem    GERD (gastroesophageal reflux disease)    Hyperlipidemia    Infertility, female    Joint pain    Microalbuminuria 06/07/2021   Neuropathy    PCOS (polycystic ovarian syndrome)    PCOS (polycystic ovarian syndrome)    Perimenopausal    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) 12/12/2021   Sciatica    Trigger finger of left thumb 07/08/2019   Type 2 diabetes mellitus with retinopathy, with long-term current use of insulin (HCC) 06/01/2017    Family History  Problem Relation Age of Onset   Mental illness Mother        bipolar mania   Hypertension Mother    Depression Mother    Early death Mother    Lung cancer Mother    Hypertension Father    Heart disease Father        CAD/CABG   Arthritis Father    Liver cancer Father    Multiple sclerosis Sister    Hypertension Sister    Diabetes Sister     Past Surgical History:  Procedure Laterality Date   CHOLECYSTECTOMY  09/08/1994   DILATION AND CURETTAGE OF UTERUS     secondary to menorrhagia   EYE SURGERY  2021   Cataract temoval   KNEE ARTHROSCOPY Left 09/08/2006   TONSILLECTOMY     as a child   WISDOM TOOTH EXTRACTION     Social History   Occupational History   Occupation: professional caregiver  Tobacco Use   Smoking status: Former    Packs/day: 1.00    Years: 25.00    Additional pack years: 0.00    Total pack years: 25.00    Types: Cigarettes    Quit date: 06/30/2005    Years since quitting: 17.6   Smokeless tobacco: Never   Tobacco comments:    electronic cigarettes PRN  Vaping Use   Vaping Use: Never used  Substance and Sexual Activity   Alcohol use: No     Comment: quit: 2007   Drug use: No    Comment: occasional   Sexual activity: Not Currently    Partners: Male    Birth control/protection: Post-menopausal, None    Comment: 1st intercourse- 16, partners- 10, married- 15 yr s

## 2023-02-26 ENCOUNTER — Telehealth: Payer: Self-pay | Admitting: Family

## 2023-02-26 NOTE — Telephone Encounter (Signed)
Caller is representative from [facility]. States pt is scheduled for tomorrow @ 2:45 pm but they need pcp to sign off on order before it can be completed.

## 2023-02-27 ENCOUNTER — Ambulatory Visit
Admission: RE | Admit: 2023-02-27 | Discharge: 2023-02-27 | Disposition: A | Discharge status: Home / Self Care | Payer: Medicaid Other | Source: Ambulatory Visit | Attending: Family | Admitting: Family

## 2023-02-27 DIAGNOSIS — R921 Mammographic calcification found on diagnostic imaging of breast: Secondary | ICD-10-CM

## 2023-02-27 NOTE — Telephone Encounter (Signed)
orders signed

## 2023-03-03 DIAGNOSIS — F3181 Bipolar II disorder: Secondary | ICD-10-CM | POA: Diagnosis not present

## 2023-03-13 ENCOUNTER — Ambulatory Visit
Admission: RE | Admit: 2023-03-13 | Discharge: 2023-03-13 | Disposition: A | Discharge status: Home / Self Care | Payer: Medicaid Other | Source: Ambulatory Visit | Attending: Family | Admitting: Family

## 2023-03-13 DIAGNOSIS — R921 Mammographic calcification found on diagnostic imaging of breast: Secondary | ICD-10-CM | POA: Diagnosis not present

## 2023-03-13 DIAGNOSIS — N6489 Other specified disorders of breast: Secondary | ICD-10-CM | POA: Diagnosis not present

## 2023-03-13 HISTORY — PX: BREAST BIOPSY: SHX20

## 2023-03-25 ENCOUNTER — Encounter: Payer: Self-pay | Admitting: Family

## 2023-03-25 DIAGNOSIS — E1169 Type 2 diabetes mellitus with other specified complication: Secondary | ICD-10-CM

## 2023-03-26 NOTE — Telephone Encounter (Signed)
send Crestor 20mg  daily - 30 pills with 5 refills & d/c her simvastatin please

## 2023-03-27 ENCOUNTER — Other Ambulatory Visit: Payer: Self-pay

## 2023-03-27 DIAGNOSIS — E1140 Type 2 diabetes mellitus with diabetic neuropathy, unspecified: Secondary | ICD-10-CM

## 2023-03-27 MED ORDER — METFORMIN HCL ER 500 MG PO TB24
500.0000 mg | ORAL_TABLET | Freq: Two times a day (BID) | ORAL | 1 refills | Status: DC
Start: 2023-03-27 — End: 2023-09-10

## 2023-03-27 MED ORDER — ROSUVASTATIN CALCIUM 20 MG PO TABS
20.0000 mg | ORAL_TABLET | Freq: Every day | ORAL | 5 refills | Status: DC
Start: 2023-03-27 — End: 2023-10-08

## 2023-04-06 DIAGNOSIS — F419 Anxiety disorder, unspecified: Secondary | ICD-10-CM | POA: Diagnosis not present

## 2023-04-06 DIAGNOSIS — F3181 Bipolar II disorder: Secondary | ICD-10-CM | POA: Diagnosis not present

## 2023-04-14 ENCOUNTER — Other Ambulatory Visit: Payer: Self-pay | Admitting: Family

## 2023-04-14 DIAGNOSIS — E1140 Type 2 diabetes mellitus with diabetic neuropathy, unspecified: Secondary | ICD-10-CM

## 2023-04-24 DIAGNOSIS — E1142 Type 2 diabetes mellitus with diabetic polyneuropathy: Secondary | ICD-10-CM | POA: Diagnosis not present

## 2023-04-24 DIAGNOSIS — Z794 Long term (current) use of insulin: Secondary | ICD-10-CM | POA: Diagnosis not present

## 2023-04-24 DIAGNOSIS — Z7984 Long term (current) use of oral hypoglycemic drugs: Secondary | ICD-10-CM | POA: Diagnosis not present

## 2023-04-24 DIAGNOSIS — Z7985 Long-term (current) use of injectable non-insulin antidiabetic drugs: Secondary | ICD-10-CM | POA: Diagnosis not present

## 2023-05-05 DIAGNOSIS — F419 Anxiety disorder, unspecified: Secondary | ICD-10-CM | POA: Diagnosis not present

## 2023-06-12 DIAGNOSIS — F411 Generalized anxiety disorder: Secondary | ICD-10-CM | POA: Diagnosis not present

## 2023-06-12 DIAGNOSIS — F50811 Binge eating disorder, moderate: Secondary | ICD-10-CM | POA: Diagnosis not present

## 2023-06-12 DIAGNOSIS — Z79899 Other long term (current) drug therapy: Secondary | ICD-10-CM | POA: Diagnosis not present

## 2023-06-12 DIAGNOSIS — F3175 Bipolar disorder, in partial remission, most recent episode depressed: Secondary | ICD-10-CM | POA: Diagnosis not present

## 2023-07-16 DIAGNOSIS — F411 Generalized anxiety disorder: Secondary | ICD-10-CM | POA: Diagnosis not present

## 2023-07-16 DIAGNOSIS — F50811 Binge eating disorder, moderate: Secondary | ICD-10-CM | POA: Diagnosis not present

## 2023-07-16 DIAGNOSIS — F3175 Bipolar disorder, in partial remission, most recent episode depressed: Secondary | ICD-10-CM | POA: Diagnosis not present

## 2023-08-20 ENCOUNTER — Other Ambulatory Visit: Payer: Self-pay | Admitting: Family

## 2023-08-20 DIAGNOSIS — E1140 Type 2 diabetes mellitus with diabetic neuropathy, unspecified: Secondary | ICD-10-CM

## 2023-09-10 ENCOUNTER — Other Ambulatory Visit: Payer: Self-pay | Admitting: *Deleted

## 2023-09-10 ENCOUNTER — Encounter: Payer: Self-pay | Admitting: Family

## 2023-09-10 DIAGNOSIS — E1140 Type 2 diabetes mellitus with diabetic neuropathy, unspecified: Secondary | ICD-10-CM

## 2023-09-10 MED ORDER — METFORMIN HCL ER 500 MG PO TB24: 500.0000 mg | ORAL_TABLET | Freq: Two times a day (BID) | ORAL | 0 refills | Status: AC

## 2023-09-10 MED ORDER — LISINOPRIL 5 MG PO TABS: 5.0000 mg | ORAL_TABLET | Freq: Every day | ORAL | 0 refills | Status: DC

## 2023-09-15 ENCOUNTER — Ambulatory Visit: Payer: Medicaid Other | Admitting: Orthopaedic Surgery

## 2023-09-15 DIAGNOSIS — Z6841 Body Mass Index (BMI) 40.0 and over, adult: Secondary | ICD-10-CM

## 2023-09-15 DIAGNOSIS — M1711 Unilateral primary osteoarthritis, right knee: Secondary | ICD-10-CM

## 2023-09-15 MED ORDER — METHYLPREDNISOLONE ACETATE 40 MG/ML IJ SUSP
40.0000 mg | INTRAMUSCULAR | Status: AC | PRN
  Administered 2023-09-15: 40 mg via INTRA_ARTICULAR

## 2023-09-15 MED ORDER — LIDOCAINE HCL 1 % IJ SOLN
2.0000 mL | INTRAMUSCULAR | Status: AC | PRN
  Administered 2023-09-15: 2 mL

## 2023-09-15 MED ORDER — BUPIVACAINE HCL 0.5 % IJ SOLN
2.0000 mL | INTRAMUSCULAR | Status: AC | PRN
  Administered 2023-09-15: 2 mL via INTRA_ARTICULAR

## 2023-09-15 NOTE — Progress Notes (Signed)
 Office Visit Note   Patient: Alicia Lucero           Date of Birth: 1961-08-25           MRN: [id number] Visit Date: 09/15/2023              Requested by: Jerri Kay HERO, MD [address] [place],  [place] [id number] PCP: Lucius Krabbe, NP   Assessment & Plan: Visit Diagnoses:  1. Primary osteoarthritis of right knee   2. Body mass index 50.0-59.9, adult (HCC)     Plan: Alicia Lucero is a 63 year old female with advanced right knee DJD.  Cortisone injection performed today.  She tolerated this well.  Follow-up next week for left knee injection  Follow-Up Instructions: No follow-ups on file.   Orders:  No orders of the defined types were placed in this encounter.  No orders of the defined types were placed in this encounter.     Procedures: Large Joint Inj: R knee on 09/15/2023 7:20 AM Indications: pain Details: 22 G needle  Arthrogram: No  Medications: 40 mg methylPREDNISolone  acetate 40 MG/ML; 2 mL lidocaine  1 %; 2 mL bupivacaine  0.5 % Consent was given by the patient. Patient was prepped and draped in the usual sterile fashion.       Clinical Data: No additional findings.   Subjective: Chief Complaint  Patient presents with   Right Knee - Pain    HPI Alicia Lucero returns today for follow-up consultation for right knee osteoarthritis. Review of Systems  Constitutional: Negative.   HENT: Negative.    Eyes: Negative.   Respiratory: Negative.    Cardiovascular: Negative.   Endocrine: Negative.   Musculoskeletal: Negative.   Neurological: Negative.   Hematological: Negative.   Psychiatric/Behavioral: Negative.    All other systems reviewed and are negative.    Objective: Vital Signs: LMP 03/28/2018   Physical Exam Vitals and nursing note reviewed.  Constitutional:      Appearance: She is well-developed.  HENT:     Head: Normocephalic and atraumatic.  Pulmonary:     Effort: Pulmonary effort is normal.  Abdominal:     Palpations: Abdomen is soft.   Musculoskeletal:     Cervical back: Neck supple.  Skin:    General: Skin is warm.     Capillary Refill: Capillary refill takes less than 2 seconds.  Neurological:     Mental Status: She is alert and oriented to person, place, and time.  Psychiatric:        Behavior: Behavior normal.        Thought Content: Thought content normal.        Judgment: Judgment normal.     Ortho Exam Exam of the right knee is unchanged from prior visit. Specialty Comments:  No specialty comments available.  Imaging: No results found.   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) 12/12/2021   Diverticular disease of colon 10/31/2021   Family history of colon cancer 10/31/2021   Sciatica 10/31/2021   Everitt Curt disease (radial styloid tenosynovitis) 07/08/2019   Gastroesophageal reflux disease without esophagitis 10/27/2018   PCOS (polycystic ovarian syndrome) 10/27/2018   Diabetic peripheral neuropathy (HCC) 10/27/2018   Binge eating disorder 10/12/2017   Vitamin D  deficiency 06/01/2017   Bipolar 2 disorder (HCC) 06/01/2017   Primary osteoarthritis of left knee 09/08/2016   Type 2 diabetes mellitus with diabetic neuropathy, with long-term current  use of insulin  (HCC) 07/19/2015   Dyslipidemia associated with type 2 diabetes mellitus (HCC) 07/19/2015   Morbid obesity with BMI of 50.0-59.9, adult (HCC) 12/26/2014   Asthma, chronic 09/21/2013   Past Medical History:  Diagnosis Date   Allergy    Anxiety 1992   Arthritis 2007   Back pain    Benign essential HTN    Bipolar 1 disorder (HCC)    Cataract 2019   Chest pain    Depression    Diabetes mellitus without complication (HCC)    Diabetic peripheral neuropathy (HCC)    Drug use    Dyspnea    Fatty liver    Gallbladder problem    GERD (gastroesophageal reflux disease)    Hyperlipidemia    Infertility,  female    Joint pain    Microalbuminuria 06/07/2021   Neuropathy    PCOS (polycystic ovarian syndrome)    PCOS (polycystic ovarian syndrome)    Perimenopausal    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) 12/12/2021   Sciatica    Trigger finger of left thumb 07/08/2019   Type 2 diabetes mellitus with retinopathy, with long-term current use of insulin  (HCC) 06/01/2017    Family History  Problem Relation Age of Onset   Mental illness Mother        bipolar mania   Hypertension Mother    Depression Mother    Early death Mother    Lung cancer Mother    Hypertension Father    Heart disease Father 26       CAD/CABG   Arthritis Father    Liver cancer Father    Multiple sclerosis Sister    Hypertension Sister    Diabetes Sister     Past Surgical History:  Procedure Laterality Date   BREAST BIOPSY Right 03/13/2023   MM RT BREAST BX W LOC DEV 1ST LESION IMAGE BX SPEC STEREO GUIDE 03/13/2023 [facility]   CHOLECYSTECTOMY  09/08/1994   DILATION AND CURETTAGE OF UTERUS     secondary to menorrhagia   EYE SURGERY  2021   Cataract temoval   KNEE ARTHROSCOPY Left 09/08/2006   TONSILLECTOMY     as a child   WISDOM TOOTH EXTRACTION     Social History   Occupational History   Occupation: professional caregiver  Tobacco Use   Smoking status: Former    Current packs/day: 0.00    Average packs/day: 1 pack/day for 25.0 years (25.0 ttl pk-yrs)    Types: Cigarettes    Start date: 06/30/1980    Quit date: 06/30/2005    Years since quitting: 18.2   Smokeless tobacco: Never   Tobacco comments:    electronic cigarettes PRN  Vaping Use   Vaping status: Never Used  Substance and Sexual Activity   Alcohol use: No    Comment: quit: 2007   Drug use: No    Comment: occasional   Sexual activity: Not Currently    Partners: Male    Birth control/protection: Post-menopausal, None    Comment: 1st intercourse- 16, partners- 10, married- 15 yr s

## 2023-09-22 ENCOUNTER — Ambulatory Visit: Payer: Medicaid Other | Admitting: Orthopaedic Surgery

## 2023-09-28 ENCOUNTER — Ambulatory Visit: Payer: Medicaid Other | Admitting: Family

## 2023-09-28 NOTE — Progress Notes (Unsigned)
Office Visit Note   Patient: Alicia Lucero           Date of Birth: August 11, 1961           MRN: [id number] Visit Date: 09/29/2023              Requested by: Dulce Sellar, NP [address] [place],  [place] [address] PCP: Dulce Sellar, NP   Assessment & Plan: Visit Diagnoses:  1. Primary osteoarthritis of left knee     Plan: Impression is left knee osteoarthritis.  Cortisone injection performed.  She tolerates well.  Follow-up as needed.  Follow-Up Instructions: No follow-ups on file.   Orders:  No orders of the defined types were placed in this encounter.  No orders of the defined types were placed in this encounter.     Procedures: Large Joint Inj: L knee on 09/29/2023 7:40 AM Details: 22 G needle Medications: 2 mL bupivacaine 0.5 %; 2 mL lidocaine 1 %; 40 mg methylPREDNISolone acetate 40 MG/ML Outcome: tolerated well, no immediate complications Patient was prepped and draped in the usual sterile fashion.       Clinical Data: No additional findings.   Subjective: Chief Complaint  Patient presents with   Right Knee - Follow-up   Left Knee - Follow-up    HPI Alicia Lucero comes in today for follow-up evaluation of left knee osteoarthritis. Review of Systems  Constitutional: Negative.   HENT: Negative.    Eyes: Negative.   Respiratory: Negative.    Cardiovascular: Negative.   Endocrine: Negative.   Musculoskeletal: Negative.   Neurological: Negative.   Hematological: Negative.   Psychiatric/Behavioral: Negative.    All other systems reviewed and are negative.    Objective: Vital Signs: LMP 03/28/2018   Physical Exam Vitals and nursing note reviewed.  Constitutional:      Appearance: She is well-developed.  HENT:     Head: Normocephalic and atraumatic.  Pulmonary:     Effort: Pulmonary effort is normal.  Abdominal:     Palpations: Abdomen is soft.  Musculoskeletal:     Cervical back: Neck supple.  Skin:    General: Skin is warm.      Capillary Refill: Capillary refill takes less than 2 seconds.  Neurological:     Mental Status: She is alert and oriented to person, place, and time.  Psychiatric:        Behavior: Behavior normal.        Thought Content: Thought content normal.        Judgment: Judgment normal.     Ortho Exam Examination of the left knee is unchanged from prior visit. Specialty Comments:  No specialty comments available.  Imaging: No results found.   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) 12/12/2021   Diverticular disease of colon 10/31/2021   Family history of colon cancer 10/31/2021   Sciatica 10/31/2021   Suzette Battiest disease (radial styloid tenosynovitis) 07/08/2019   Gastroesophageal reflux disease without esophagitis 10/27/2018   PCOS (polycystic ovarian syndrome) 10/27/2018   Diabetic peripheral neuropathy (HCC) 10/27/2018   Binge eating disorder 10/12/2017   Vitamin D deficiency 06/01/2017   Bipolar 2 disorder (HCC) 06/01/2017   Primary osteoarthritis of left knee 09/08/2016   Type 2 diabetes mellitus with diabetic neuropathy, with long-term current use of insulin (HCC) 07/19/2015   Dyslipidemia associated with type 2 diabetes mellitus (HCC) 07/19/2015   Morbid  obesity with BMI of 50.0-59.9, adult (HCC) 12/26/2014   Asthma, chronic 09/21/2013   Past Medical History:  Diagnosis Date   Allergy    Anxiety 1992   Arthritis 2007   Back pain    Benign essential HTN    Bipolar 1 disorder (HCC)    Cataract 2019   Chest pain    Depression    Diabetes mellitus without complication (HCC)    Diabetic peripheral neuropathy (HCC)    Drug use    Dyspnea    Fatty liver    Gallbladder problem    GERD (gastroesophageal reflux disease)    Hyperlipidemia    Infertility, female    Joint pain    Microalbuminuria 06/07/2021   Neuropathy    PCOS (polycystic  ovarian syndrome)    PCOS (polycystic ovarian syndrome)    Perimenopausal    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) 12/12/2021   Sciatica    Trigger finger of left thumb 07/08/2019   Type 2 diabetes mellitus with retinopathy, with long-term current use of insulin (HCC) 06/01/2017    Family History  Problem Relation Age of Onset   Mental illness Mother        bipolar mania   Hypertension Mother    Depression Mother    Early death Mother    Lung cancer Mother    Hypertension Father    Heart disease Father 5       CAD/CABG   Arthritis Father    Liver cancer Father    Multiple sclerosis Sister    Hypertension Sister    Diabetes Sister     Past Surgical History:  Procedure Laterality Date   BREAST BIOPSY Right 03/13/2023   MM RT BREAST BX W LOC DEV 1ST LESION IMAGE BX SPEC STEREO GUIDE 03/13/2023 [facility]   CHOLECYSTECTOMY  09/08/1994   DILATION AND CURETTAGE OF UTERUS     secondary to menorrhagia   EYE SURGERY  2021   Cataract temoval   KNEE ARTHROSCOPY Left 09/08/2006   TONSILLECTOMY     as a child   WISDOM TOOTH EXTRACTION     Social History   Occupational History   Occupation: professional caregiver  Tobacco Use   Smoking status: Former    Current packs/day: 0.00    Average packs/day: 1 pack/day for 25.0 years (25.0 ttl pk-yrs)    Types: Cigarettes    Start date: 06/30/1980    Quit date: 06/30/2005    Years since quitting: 18.2   Smokeless tobacco: Never   Tobacco comments:    electronic cigarettes PRN  Vaping Use   Vaping status: Never Used  Substance and Sexual Activity   Alcohol use: No    Comment: quit: 2007   Drug use: No    Comment: occasional   Sexual activity: Not Currently    Partners: Male    Birth control/protection: Post-menopausal, None    Comment: 1st intercourse- 16, partners- 10, married- 15 yr s

## 2023-09-29 ENCOUNTER — Encounter: Payer: Self-pay | Admitting: Orthopaedic Surgery

## 2023-09-29 ENCOUNTER — Ambulatory Visit (INDEPENDENT_AMBULATORY_CARE_PROVIDER_SITE_OTHER): Payer: Medicaid Other | Admitting: Orthopaedic Surgery

## 2023-09-29 DIAGNOSIS — M1712 Unilateral primary osteoarthritis, left knee: Secondary | ICD-10-CM

## 2023-09-29 MED ORDER — METHYLPREDNISOLONE ACETATE 40 MG/ML IJ SUSP
40.0000 mg | INTRAMUSCULAR | Status: AC | PRN
  Administered 2023-09-29: 40 mg via INTRA_ARTICULAR

## 2023-09-29 MED ORDER — BUPIVACAINE HCL 0.5 % IJ SOLN
2.0000 mL | INTRAMUSCULAR | Status: AC | PRN
  Administered 2023-09-29: 2 mL via INTRA_ARTICULAR

## 2023-09-29 MED ORDER — LIDOCAINE HCL 1 % IJ SOLN
2.0000 mL | INTRAMUSCULAR | Status: AC | PRN
  Administered 2023-09-29: 2 mL

## 2023-09-30 ENCOUNTER — Ambulatory Visit: Payer: Medicaid Other | Admitting: Family

## 2023-09-30 NOTE — Progress Notes (Deleted)
Patient ID: Alicia Lucero, female    DOB: 01-14-1961, 63 y.o.   MRN: [id number]  No chief complaint on file.           Assessment & Plan:   Subjective:    Outpatient Medications Prior to Visit  Medication Sig Dispense Refill   ACCU-CHEK AVIVA PLUS test strip USE AS DIRECTED 100 each 0   ACCU-CHEK SOFTCLIX LANCETS lancets USE AS DIRECTED 100 each 1   albuterol (VENTOLIN HFA) 108 (90 Base) MCG/ACT inhaler inhale 2 puffs by mouth every 4 hours if needed for cough wheezing or shortness of breath 18 g 1   atomoxetine (STRATTERA) 80 MG capsule Take 80 mg by mouth daily.     Cholecalciferol (VITAMIN D) 2000 units tablet Take 1 tablet (2,000 Units total) by mouth daily. 30 tablet 0   esomeprazole (NEXIUM) 40 MG capsule Take by mouth.     gabapentin (NEURONTIN) 600 MG tablet Take 600 mg by mouth 2 (two) times daily.  0   GLUCOSA-CHONDR-NA CHONDR-MSM PO Take by mouth.     insulin glargine (LANTUS) 100 UNIT/ML Solostar Pen Inject 64 Units into the skin daily. 15 mL 3   Insulin Pen Needle (B-D ULTRAFINE III SHORT PEN) 31G X 8 MM MISC daily 100 each 3   ketoconazole (NIZORAL) 2 % cream Apply 1 application topically 2 (two) times daily. 60 g 0   lamoTRIgine (LAMICTAL) 200 MG tablet Take 2 tablets (400 mg total) by mouth every evening. 180 tablet 1   lisinopril (ZESTRIL) 5 MG tablet Take 1 tablet (5 mg total) by mouth daily. 90 tablet 0   metFORMIN (GLUCOPHAGE-XR) 500 MG 24 hr tablet Take 1 tablet (500 mg total) by mouth 2 (two) times daily with a meal. 180 tablet 0   Multiple Vitamins-Minerals (WOMENS MULTIVITAMIN PLUS PO) Take 1 tablet by mouth daily.      ondansetron (ZOFRAN-ODT) 4 MG disintegrating tablet Take 1 tablet (4 mg total) by mouth every 8 (eight) hours as needed. 20 tablet 0   REXULTI 1 MG TABS tablet Take 1 mg by mouth daily.     rosuvastatin (CRESTOR) 20 MG tablet Take 1 tablet (20 mg total) by mouth daily. 30 tablet 5   Semaglutide, 2 MG/DOSE, 8 MG/3ML SOPN Inject 2 mg as directed  once a week. (Patient taking differently: Inject 2 mg as directed once a week. OZEMPIC) 9 mL 0   sertraline (ZOLOFT) 100 MG tablet Take 2 tablets (200 mg total) by mouth daily. 180 tablet 1   traZODone (DESYREL) 50 MG tablet Take 25-50 mg by mouth at bedtime as needed for sleep.     No facility-administered medications prior to visit.   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) 12/12/2021  Sciatica    Trigger finger of left thumb 07/08/2019   Type 2 diabetes mellitus with retinopathy, with long-term current use of insulin (HCC) 06/01/2017   Past Surgical History:  Procedure Laterality Date   BREAST BIOPSY Right 03/13/2023   MM RT BREAST BX W LOC DEV 1ST LESION IMAGE BX SPEC STEREO GUIDE 03/13/2023 [facility]   CHOLECYSTECTOMY  09/08/1994   DILATION AND CURETTAGE OF UTERUS     secondary to menorrhagia   EYE SURGERY  2021   Cataract temoval   KNEE ARTHROSCOPY Left 09/08/2006   TONSILLECTOMY     as a child   WISDOM TOOTH EXTRACTION     Allergies  Allergen Reactions   Empagliflozin Other (See Comments)    Causes yeast infection   Empagliflozin-Linagliptin Other (See Comments)    Causes yeast infection   Other Nausea Only    States she can't tolerate seafood smell      Objective:    Physical Exam Vitals and nursing note reviewed.  Constitutional:      Appearance: Normal appearance.   Cardiovascular:     Rate and Rhythm: Normal rate and regular rhythm.  Pulmonary:     Effort: Pulmonary effort is normal.     Breath sounds: Normal breath sounds.  Musculoskeletal:        General: Normal range of motion.  Skin:    General: Skin is warm and dry.  Neurological:     Mental Status: She is alert.  Psychiatric:        Mood and Affect: Mood normal.        Behavior: Behavior normal.    LMP 03/28/2018  Wt Readings from Last 3 Encounters:  01/05/23 (!) 321 lb 6 oz (145.8 kg)  05/02/22 (!) 317 lb (143.8 kg)  03/12/22 (!) 313 lb 6 oz (142.1 kg)       Dulce Sellar, NP

## 2023-10-05 ENCOUNTER — Ambulatory Visit: Payer: Medicaid Other | Admitting: Family

## 2023-10-06 DIAGNOSIS — F411 Generalized anxiety disorder: Secondary | ICD-10-CM | POA: Diagnosis not present

## 2023-10-06 DIAGNOSIS — F3175 Bipolar disorder, in partial remission, most recent episode depressed: Secondary | ICD-10-CM | POA: Diagnosis not present

## 2023-10-08 ENCOUNTER — Other Ambulatory Visit: Payer: Self-pay | Admitting: Family

## 2023-10-08 DIAGNOSIS — E1169 Type 2 diabetes mellitus with other specified complication: Secondary | ICD-10-CM

## 2023-10-12 ENCOUNTER — Encounter: Payer: Self-pay | Admitting: Family

## 2023-10-12 ENCOUNTER — Ambulatory Visit: Payer: Medicaid Other | Admitting: Family

## 2023-10-12 VITALS — BP 117/79 | HR 96 | Temp 97.0°F | Ht 65.0 in | Wt 340.0 lb

## 2023-10-12 DIAGNOSIS — E1159 Type 2 diabetes mellitus with other circulatory complications: Secondary | ICD-10-CM | POA: Diagnosis not present

## 2023-10-12 DIAGNOSIS — E785 Hyperlipidemia, unspecified: Secondary | ICD-10-CM | POA: Diagnosis not present

## 2023-10-12 DIAGNOSIS — Z794 Long term (current) use of insulin: Secondary | ICD-10-CM | POA: Diagnosis not present

## 2023-10-12 DIAGNOSIS — E1169 Type 2 diabetes mellitus with other specified complication: Secondary | ICD-10-CM

## 2023-10-12 DIAGNOSIS — Z6841 Body Mass Index (BMI) 40.0 and over, adult: Secondary | ICD-10-CM

## 2023-10-12 DIAGNOSIS — E1140 Type 2 diabetes mellitus with diabetic neuropathy, unspecified: Secondary | ICD-10-CM

## 2023-10-12 DIAGNOSIS — K219 Gastro-esophageal reflux disease without esophagitis: Secondary | ICD-10-CM

## 2023-10-12 DIAGNOSIS — I152 Hypertension secondary to endocrine disorders: Secondary | ICD-10-CM | POA: Diagnosis not present

## 2023-10-12 MED ORDER — LISINOPRIL 5 MG PO TABS: 5.0000 mg | ORAL_TABLET | Freq: Every day | ORAL | 1 refills | Status: DC

## 2023-10-12 MED ORDER — ROSUVASTATIN CALCIUM 20 MG PO TABS: 20.0000 mg | ORAL_TABLET | Freq: Every day | ORAL | Status: AC

## 2023-10-12 NOTE — Assessment & Plan Note (Signed)
Well controlled on Lisinopril 5mg  qd. -Continue Lisinopril, refill for 6 mos. -F/U in 6 mos.

## 2023-10-12 NOTE — Assessment & Plan Note (Signed)
Severe nausea, vomiting, bloating, and discomfort potentially related to Ozempic. Nexium once daily not fully controlling symptoms. -Increase Nexium to twice daily for a week to control potential acid buildup contributing to nausea.

## 2023-10-12 NOTE — Progress Notes (Signed)
Patient ID: Alicia Lucero, female    DOB: 1960/12/24, 63 y.o.   MRN: [id number]  Chief Complaint  Patient presents with   Abdominal Pain    Pt c/o abdominal/pelvic pain, bloating, vomiting and gas. Present for 1 week. Has tried Nexium which did not help.    Diabetes   Hyperlipidemia       Discussed the use of AI scribe software for clinical note transcription with the patient, who gave verbal consent to proceed.  History of Present Illness   The patient, with type 2 diabetes, presents for follow up and with an acute complaint of stomach pain, discomfort, vomiting and bloating. She has been on Ozempic for about two years for type 2 diabetes. Recently, she experienced a two-week gap in medication due to a prescription issue with [facility], which her insurance did not cover. Upon resuming Ozempic at a 2 mg dose, she experienced significant discomfort, including nausea and vomiting. The nausea is sudden and intense, leading to immediate vomiting without prolonged queasiness. She has tried reducing the Ozempic dose to 1 mg to manage symptoms but continues to experience discomfort. Despite taking Nexium once daily, she has persistent nausea. Ondansetron has been ineffective for her nausea due to the rapid onset of symptoms. Her latest A1c was reported to be in the sixes, with a specific mention of 6.8 in the summer. She has gained weight instead of losing it while on Ozempic. She is on Medicaid, which covers Ozempic but not Mounjaro.     Assessment & Plan:     Type 2 Diabetes w/morbid Obesity -  On Ozempic 2mg  for 2 years, recently had a 2-week gap in therapy due to confusion with endocrinologist's prescription for Mounjaro. Patient reports weight gain and severe GI symptoms (nausea, vomiting, bloating, discomfort) potentially related to Ozempic. Last A1c was 6.8. -Advised pt to decrease Ozempic dose to 0.25mg  for 1 week, then increase to 0.5mg  for 3 weeks, then go to 1mg  again for 4  weeks. -Continue monitoring A1c levels thru Endocrinology.  Gastrointestinal Discomfort - Severe nausea, vomiting, bloating, and discomfort potentially related to Ozempic. Nexium once daily not fully controlling symptoms. -Increase Nexium to twice daily for a week to control potential acid buildup contributing to nausea.  Hypertension - Well controlled on Lisinopril 5mg  qd. -Continue Lisinopril, refill for 6 mos. -F/U in 6 mos.  Hyperlipidemia - On Rosuvastatin 20mg , cholesterol last checked in April. -Continue Rosuvastatin 20mg , no need for repeat cholesterol check at this time, refill recently sent in. -F/U in 6 mos with fasting labs     Subjective:    Outpatient Medications Prior to Visit  Medication Sig Dispense Refill   ACCU-CHEK AVIVA PLUS test strip USE AS DIRECTED 100 each 0   ACCU-CHEK SOFTCLIX LANCETS lancets USE AS DIRECTED 100 each 1   albuterol (VENTOLIN HFA) 108 (90 Base) MCG/ACT inhaler inhale 2 puffs by mouth every 4 hours if needed for cough wheezing or shortness of breath 18 g 1   atomoxetine (STRATTERA) 80 MG capsule Take 80 mg by mouth daily.     Cholecalciferol (VITAMIN D) 2000 units tablet Take 1 tablet (2,000 Units total) by mouth daily. 30 tablet 0   esomeprazole (NEXIUM) 40 MG capsule Take by mouth.     gabapentin (NEURONTIN) 600 MG tablet Take 600 mg by mouth 2 (two) times daily.  0   GLUCOSA-CHONDR-NA CHONDR-MSM PO Take by mouth.     insulin glargine (LANTUS) 100 UNIT/ML Solostar Pen Inject 64 Units into the  skin daily. 15 mL 3   Insulin Pen Needle (B-D ULTRAFINE III SHORT PEN) 31G X 8 MM MISC daily 100 each 3   ketoconazole (NIZORAL) 2 % cream Apply 1 application topically 2 (two) times daily. 60 g 0   lamoTRIgine (LAMICTAL) 200 MG tablet Take 2 tablets (400 mg total) by mouth every evening. 180 tablet 1   lisinopril (ZESTRIL) 5 MG tablet Take 1 tablet (5 mg total) by mouth daily. 90 tablet 0   metFORMIN (GLUCOPHAGE-XR) 500 MG 24 hr tablet Take 1 tablet  (500 mg total) by mouth 2 (two) times daily with a meal. 180 tablet 0   Multiple Vitamins-Minerals (WOMENS MULTIVITAMIN PLUS PO) Take 1 tablet by mouth daily.      ondansetron (ZOFRAN-ODT) 4 MG disintegrating tablet Take 1 tablet (4 mg total) by mouth every 8 (eight) hours as needed. 20 tablet 0   REXULTI 1 MG TABS tablet Take 1 mg by mouth daily.     rosuvastatin (CRESTOR) 20 MG tablet TAKE 1 TABLET(20 MG) BY MOUTH DAILY 30 tablet 5   Semaglutide, 2 MG/DOSE, 8 MG/3ML SOPN Inject 2 mg as directed once a week. (Patient taking differently: Inject 2 mg as directed once a week. OZEMPIC) 9 mL 0   sertraline (ZOLOFT) 100 MG tablet Take 2 tablets (200 mg total) by mouth daily. 180 tablet 1   traZODone (DESYREL) 50 MG tablet Take 25-50 mg by mouth at bedtime as needed for sleep.     No facility-administered medications prior to visit.   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) 12/12/2021   Sciatica    Trigger finger of left thumb 07/08/2019   Type 2 diabetes mellitus with retinopathy, with long-term current use of insulin (HCC) 06/01/2017   Past Surgical History:  Procedure Laterality Date   BREAST BIOPSY Right 03/13/2023   MM RT BREAST BX W LOC DEV 1ST LESION IMAGE BX SPEC STEREO GUIDE 03/13/2023 [facility]   CHOLECYSTECTOMY  09/08/1994   DILATION AND CURETTAGE OF UTERUS     secondary to menorrhagia   EYE  SURGERY  2021   Cataract temoval   KNEE ARTHROSCOPY Left 09/08/2006   TONSILLECTOMY     as a child   WISDOM TOOTH EXTRACTION     Allergies  Allergen Reactions   Empagliflozin Other (See Comments)    Causes yeast infection   Empagliflozin-Linagliptin Other (See Comments)    Causes yeast infection   Other Nausea Only    States she can't tolerate seafood smell      Objective:    Physical Exam Vitals and nursing note reviewed.  Constitutional:      Appearance: Normal appearance. She is morbidly obese.  Cardiovascular:     Rate and Rhythm:  Normal rate and regular rhythm.  Pulmonary:     Effort: Pulmonary effort is normal.     Breath sounds: Normal breath sounds.  Musculoskeletal:        General: Normal range of motion.  Skin:    General: Skin is warm and dry.  Neurological:     Mental Status: She is alert.  Psychiatric:        Mood and Affect: Mood normal.        Behavior: Behavior normal.    BP 117/79 (BP Location: Left Arm, Patient Position: Sitting, Cuff Size: Large)   Pulse 96   Temp (!) 97 F (36.1 C) (Temporal)   Ht 5\' 5"  (1.651 m)   Wt (!) 340 lb (154.2 kg)   LMP 03/28/2018   SpO2 96%   BMI 56.58 kg/m  Wt Readings from Last 3 Encounters:  10/12/23 (!) 340 lb (154.2 kg)  01/05/23 (!) 321 lb 6 oz (145.8 kg)  05/02/22 (!) 317 lb (143.8 kg)      Dulce Sellar, NP

## 2023-10-12 NOTE — Assessment & Plan Note (Signed)
On Ozempic 2mg  for 2 years, recently had a 2-week gap in therapy due to confusion with endocrinologist's prescription for [facility]. Patient reports weight gain and severe GI symptoms (nausea, vomiting, bloating, discomfort) potentially related to Ozempic. Last A1c was 6.8. -Advised pt to decrease Ozempic dose to 0.25mg  for 1 week, then increase to 0.5mg  for 3 weeks, then go to 1mg  again for 4 weeks. -Continue monitoring A1c levels thru Endocrinology.

## 2023-10-12 NOTE — Assessment & Plan Note (Signed)
On Rosuvastatin 20mg , cholesterol last checked in April. -Continue Rosuvastatin 20mg , no need for repeat cholesterol check at this time, refill recently sent in. -F/U in 6 mos with fasting labs

## 2023-10-21 ENCOUNTER — Other Ambulatory Visit: Payer: Self-pay | Admitting: Family

## 2023-10-21 DIAGNOSIS — E1140 Type 2 diabetes mellitus with diabetic neuropathy, unspecified: Secondary | ICD-10-CM

## 2023-11-05 ENCOUNTER — Ambulatory Visit: Payer: Medicaid Other | Admitting: Obstetrics and Gynecology

## 2023-11-12 DIAGNOSIS — B379 Candidiasis, unspecified: Secondary | ICD-10-CM | POA: Diagnosis not present

## 2023-11-12 DIAGNOSIS — L0231 Cutaneous abscess of buttock: Secondary | ICD-10-CM | POA: Diagnosis not present

## 2023-11-16 ENCOUNTER — Other Ambulatory Visit: Payer: Self-pay | Admitting: Family

## 2023-11-16 DIAGNOSIS — E1140 Type 2 diabetes mellitus with diabetic neuropathy, unspecified: Secondary | ICD-10-CM

## 2023-11-17 DIAGNOSIS — F411 Generalized anxiety disorder: Secondary | ICD-10-CM | POA: Diagnosis not present

## 2023-11-17 DIAGNOSIS — F3175 Bipolar disorder, in partial remission, most recent episode depressed: Secondary | ICD-10-CM | POA: Diagnosis not present

## 2023-11-24 DIAGNOSIS — F411 Generalized anxiety disorder: Secondary | ICD-10-CM | POA: Diagnosis not present

## 2023-11-24 DIAGNOSIS — F4323 Adjustment disorder with mixed anxiety and depressed mood: Secondary | ICD-10-CM | POA: Diagnosis not present

## 2023-11-24 DIAGNOSIS — F3175 Bipolar disorder, in partial remission, most recent episode depressed: Secondary | ICD-10-CM | POA: Diagnosis not present

## 2023-11-25 ENCOUNTER — Encounter (HOSPITAL_BASED_OUTPATIENT_CLINIC_OR_DEPARTMENT_OTHER): Payer: Self-pay

## 2023-11-25 ENCOUNTER — Emergency Department (HOSPITAL_BASED_OUTPATIENT_CLINIC_OR_DEPARTMENT_OTHER)

## 2023-11-25 ENCOUNTER — Other Ambulatory Visit: Payer: Self-pay

## 2023-11-25 ENCOUNTER — Emergency Department (HOSPITAL_BASED_OUTPATIENT_CLINIC_OR_DEPARTMENT_OTHER)
Admission: EM | Admit: 2023-11-25 | Discharge: 2023-11-25 | Disposition: A | Discharge status: Home / Self Care | Attending: Emergency Medicine | Admitting: Emergency Medicine

## 2023-11-25 DIAGNOSIS — K449 Diaphragmatic hernia without obstruction or gangrene: Secondary | ICD-10-CM | POA: Diagnosis not present

## 2023-11-25 DIAGNOSIS — K5730 Diverticulosis of large intestine without perforation or abscess without bleeding: Secondary | ICD-10-CM | POA: Diagnosis not present

## 2023-11-25 DIAGNOSIS — Z794 Long term (current) use of insulin: Secondary | ICD-10-CM | POA: Diagnosis not present

## 2023-11-25 DIAGNOSIS — R1032 Left lower quadrant pain: Secondary | ICD-10-CM | POA: Insufficient documentation

## 2023-11-25 DIAGNOSIS — N200 Calculus of kidney: Secondary | ICD-10-CM | POA: Diagnosis not present

## 2023-11-25 DIAGNOSIS — R109 Unspecified abdominal pain: Secondary | ICD-10-CM

## 2023-11-25 LAB — CBC
HCT: 39.1 % (ref 36.0–46.0)
Hemoglobin: 13 g/dL (ref 12.0–15.0)
MCH: 28.9 pg (ref 26.0–34.0)
MCHC: 33.2 g/dL (ref 30.0–36.0)
MCV: 86.9 fL (ref 80.0–100.0)
Platelets: 349 10*3/uL (ref 150–400)
RBC: 4.5 MIL/uL (ref 3.87–5.11)
RDW: 14 % (ref 11.5–15.5)
WBC: 14 10*3/uL — ABNORMAL HIGH (ref 4.0–10.5)
nRBC: 0 % (ref 0.0–0.2)

## 2023-11-25 LAB — BASIC METABOLIC PANEL
Anion gap: 13 (ref 5–15)
BUN: 14 mg/dL (ref 6–20)
CO2: 22 mmol/L (ref 22–32)
Calcium: 8.7 mg/dL — ABNORMAL LOW (ref 8.9–10.3)
Chloride: 99 mmol/L (ref 98–111)
Creatinine, Ser: 0.94 mg/dL (ref 0.44–1.00)
GFR, Estimated: >60 mL/min (ref >60)
Glucose, Bld: 343 mg/dL — ABNORMAL HIGH (ref 70–99)
Potassium: 4 mmol/L (ref 3.5–5.1)
Sodium: 134 mmol/L — ABNORMAL LOW (ref 135–145)

## 2023-11-25 LAB — URINALYSIS, ROUTINE W REFLEX MICROSCOPIC
Bilirubin Urine: NEGATIVE
Glucose, UA: 500 mg/dL — AB
Hgb urine dipstick: NEGATIVE
Nitrite: NEGATIVE
Protein, ur: 100 mg/dL — AB
Specific Gravity, Urine: 1.042 — ABNORMAL HIGH (ref 1.005–1.030)
pH: 5.5 (ref 5.0–8.0)

## 2023-11-25 MED ORDER — OXYCODONE-ACETAMINOPHEN 5-325 MG PO TABS
1.0000 | ORAL_TABLET | ORAL | Status: DC | PRN
  Administered 2023-11-25: 1 via ORAL
  Filled 2023-11-25: qty 1

## 2023-11-25 MED ORDER — IOHEXOL 300 MG/ML  SOLN
100.0000 mL | Freq: Once | INTRAMUSCULAR | Status: AC | PRN
  Administered 2023-11-25: 100 mL via INTRAVENOUS

## 2023-11-25 MED ORDER — MORPHINE SULFATE (PF) 4 MG/ML IV SOLN
6.0000 mg | Freq: Once | INTRAVENOUS | Status: AC
  Administered 2023-11-25: 6 mg via INTRAVENOUS
  Filled 2023-11-25: qty 2

## 2023-11-25 NOTE — ED Provider Notes (Signed)
 [place] EMERGENCY DEPARTMENT AT [facility] Provider Note   CSN: [id number] Arrival date & time: 11/25/23  1446     History  No chief complaint on file.   Alicia Lucero is a 63 y.o. female.  This is a 63 year old female presents with ongoing left upper and left lower quadrant abdominal pain x 4 to 5 months.  Is gotten worse over the last several weeks.  States that the pain is worse with certain movement and characterizes sharp.  Pain does radiate to her groin.  Denies any masses in her groin.  No associated fever, vomiting, diarrhea.  Was thought to be related to Ozempic which her doctor had suggested.  Patient states that she did not take her recent injection several days ago.  She denies any urinary symptoms.  No association with food.  No vaginal bleeding or discharge.  She is very frustrated at her current state       Home Medications Prior to Admission medications   Medication Sig Start Date End Date Taking? Authorizing Provider  ACCU-CHEK AVIVA PLUS test strip USE AS DIRECTED 05/07/14   [name], MD  ACCU-CHEK SOFTCLIX LANCETS lancets USE AS DIRECTED    Rhoderick Moody M, PA-C  albuterol (VENTOLIN HFA) 108 (90 Base) MCG/ACT inhaler inhale 2 puffs by mouth every 4 hours if needed for cough wheezing or shortness of breath 12/25/17   Ofilia Neas, PA-C  atomoxetine (STRATTERA) 80 MG capsule Take 80 mg by mouth daily. 12/22/22   [provider]  Cholecalciferol (VITAMIN D) 2000 units tablet Take 1 tablet (2,000 Units total) by mouth daily. 06/01/17   Quillian Quince D, MD  esomeprazole (NEXIUM) 40 MG capsule Take by mouth. 11/28/19   [provider]  gabapentin (NEURONTIN) 600 MG tablet Take 600 mg by mouth 2 (two) times daily. 04/21/18   [provider]  GLUCOSA-CHONDR-NA CHONDR-MSM PO Take by mouth.    [provider]  insulin glargine (LANTUS) 100 UNIT/ML Solostar Pen Inject 64 Units into the skin daily. 07/03/22   Dulce Sellar, NP  Insulin Pen Needle (B-D ULTRAFINE III SHORT PEN) 31G X 8 MM MISC daily 03/12/22   Dulce Sellar, NP  ketoconazole (NIZORAL) 2 % cream Apply 1 application topically 2 (two) times daily. 07/19/15   [name], MD  lamoTRIgine (LAMICTAL) 200 MG tablet Take 2 tablets (400 mg total) by mouth every evening. 02/11/16   Ethelda Chick, MD  lisinopril (ZESTRIL) 5 MG tablet Take 1 tablet (5 mg total) by mouth daily. 10/12/23   Dulce Sellar, NP  metFORMIN (GLUCOPHAGE-XR) 500 MG 24 hr tablet Take 1 tablet (500 mg total) by mouth 2 (two) times daily with a meal. 09/10/23   Dulce Sellar, NP  Multiple Vitamins-Minerals (WOMENS MULTIVITAMIN PLUS PO) Take 1 tablet by mouth daily.     [provider]  ondansetron (ZOFRAN-ODT) 4 MG disintegrating tablet Take 1 tablet (4 mg total) by mouth every 8 (eight) hours as needed. 05/02/22   Long, Arlyss Repress, MD  OZEMPIC, 2 MG/DOSE, 8 MG/3ML SOPN INJECT 2 MG INTO THE SKIN ONCE WEEKLY 10/21/23   Hudnell, Judeth Cornfield, NP  REXULTI 1 MG TABS tablet Take 1 mg by mouth daily. 07/24/21   [provider]  rosuvastatin (CRESTOR) 20 MG tablet Take 1 tablet (20 mg total) by mouth daily. 10/12/23   Dulce Sellar, NP  sertraline (ZOLOFT) 100 MG tablet Take 2 tablets (200 mg total) by mouth daily. 02/11/16   Ethelda Chick, MD  traZODone (DESYREL) 50 MG tablet Take 25-50 mg by mouth at bedtime as needed for sleep. 12/22/22   [provider]      Allergies    Empagliflozin, Empagliflozin-linagliptin, and Other    Review of Systems   Review of Systems  All other systems reviewed and are negative.   Physical Exam Updated Vital Signs BP (!) 178/91 (BP Location: Right Arm)   Pulse 100   Temp 98.4 F (36.9 C) (Oral)   Resp 18   LMP 03/28/2018   SpO2 98%  Physical Exam Vitals and nursing note reviewed.  Constitutional:      General: She is not in acute distress.    Appearance: Normal appearance. She is well-developed. She is  not toxic-appearing.  HENT:     Head: Normocephalic and atraumatic.  Eyes:     General: Lids are normal.     Conjunctiva/sclera: Conjunctivae normal.     Pupils: Pupils are equal, round, and reactive to light.  Neck:     Thyroid: No thyroid mass.     Trachea: No tracheal deviation.  Cardiovascular:     Rate and Rhythm: Normal rate and regular rhythm.     Heart sounds: Normal heart sounds. No murmur heard.    No gallop.  Pulmonary:     Effort: Pulmonary effort is normal. No respiratory distress.     Breath sounds: Normal breath sounds. No stridor. No decreased breath sounds, wheezing, rhonchi or rales.  Abdominal:     General: There is no distension.     Palpations: Abdomen is soft.     Tenderness: There is no abdominal tenderness. There is no rebound.    Musculoskeletal:        General: No tenderness. Normal range of motion.     Cervical back: Normal range of motion and neck supple.  Skin:    General: Skin is warm and dry.     Findings: No abrasion or rash.  Neurological:     Mental Status: She is alert and oriented to person, place, and time. Mental status is at baseline.     GCS: GCS eye subscore is 4. GCS verbal subscore is 5. GCS motor subscore is 6.     Cranial Nerves: No cranial nerve deficit.     Sensory: No sensory deficit.     Motor: Motor function is intact.  Psychiatric:        Attention and Perception: Attention normal.        Speech: Speech normal.        Behavior: Behavior normal.     ED Results / Procedures / Treatments   Labs (all labs ordered are listed, but only abnormal results are displayed) Labs Reviewed  URINALYSIS, ROUTINE W REFLEX MICROSCOPIC - Abnormal; Notable for the following components:      Result Value   APPearance HAZY (*)    Specific Gravity, Urine 1.042 (*)    Glucose, UA 500 (*)    Ketones, ur TRACE (*)    Protein, ur 100 (*)    Leukocytes,Ua TRACE (*)    Bacteria, UA RARE (*)    All other components within normal limits   BASIC METABOLIC PANEL - Abnormal; Notable for the following components:   Sodium 134 (*)    Glucose, Bld 343 (*)    Calcium 8.7 (*)    All other components within normal limits  CBC - Abnormal; Notable for the following components:   WBC 14.0 (*)    All other components within  normal limits    EKG None  Radiology No results found.  Procedures Procedures    Medications Ordered in ED Medications  oxyCODONE-acetaminophen (PERCOCET/ROXICET) 5-325 MG per tablet 1 tablet (1 tablet Oral Given 11/25/23 1522)    ED Course/ Medical Decision Making/ A&P                                 Medical Decision Making Amount and/or Complexity of Data Reviewed Labs: ordered. Radiology: ordered.  Risk Prescription drug management.   Labs here are otherwise reassuring.  Abdominal CT shows left renal pelvis calculus and small right sided adrenal nodule.  Follow-up instructions given for that.  Patient medicated for pain here with morphine and feels better.  Patient to follow-up with her doctor        Final Clinical Impression(s) / ED Diagnoses Final diagnoses:  None    Rx / DC Orders ED Discharge Orders     None         Lorre Nick, MD 11/25/23 2014

## 2023-11-25 NOTE — ED Triage Notes (Addendum)
 Left flank pain radiating into groin on and off several months.  Has become more constant. Denies bleeding. Intermittent diarrhea. Denies urinary frequency or difficulty going. Some dysuria.  Ozempic.

## 2023-11-25 NOTE — Discharge Instructions (Signed)
 You had a small nodule on your right adrenal gland.  Follow-up with your doctor for this

## 2023-12-01 DIAGNOSIS — F3175 Bipolar disorder, in partial remission, most recent episode depressed: Secondary | ICD-10-CM | POA: Diagnosis not present

## 2023-12-01 DIAGNOSIS — F411 Generalized anxiety disorder: Secondary | ICD-10-CM | POA: Diagnosis not present

## 2023-12-02 ENCOUNTER — Encounter: Payer: Self-pay | Admitting: Family

## 2023-12-03 NOTE — Telephone Encounter (Signed)
 This requires a visit - can be virtual, thx

## 2023-12-08 DIAGNOSIS — E279 Disorder of adrenal gland, unspecified: Secondary | ICD-10-CM | POA: Diagnosis not present

## 2023-12-08 DIAGNOSIS — E1142 Type 2 diabetes mellitus with diabetic polyneuropathy: Secondary | ICD-10-CM | POA: Diagnosis not present

## 2023-12-08 DIAGNOSIS — Z794 Long term (current) use of insulin: Secondary | ICD-10-CM | POA: Diagnosis not present

## 2023-12-09 ENCOUNTER — Emergency Department (HOSPITAL_BASED_OUTPATIENT_CLINIC_OR_DEPARTMENT_OTHER)
Admission: EM | Admit: 2023-12-09 | Discharge: 2023-12-09 | Disposition: A | Discharge status: Home / Self Care | Attending: Emergency Medicine | Admitting: Emergency Medicine

## 2023-12-09 ENCOUNTER — Encounter (HOSPITAL_BASED_OUTPATIENT_CLINIC_OR_DEPARTMENT_OTHER): Payer: Self-pay

## 2023-12-09 ENCOUNTER — Other Ambulatory Visit: Payer: Self-pay

## 2023-12-09 DIAGNOSIS — Z7984 Long term (current) use of oral hypoglycemic drugs: Secondary | ICD-10-CM | POA: Insufficient documentation

## 2023-12-09 DIAGNOSIS — Z794 Long term (current) use of insulin: Secondary | ICD-10-CM | POA: Insufficient documentation

## 2023-12-09 DIAGNOSIS — E119 Type 2 diabetes mellitus without complications: Secondary | ICD-10-CM | POA: Insufficient documentation

## 2023-12-09 DIAGNOSIS — N751 Abscess of Bartholin's gland: Secondary | ICD-10-CM | POA: Diagnosis not present

## 2023-12-09 MED ORDER — DOXYCYCLINE HYCLATE 100 MG PO CAPS
100.0000 mg | ORAL_CAPSULE | Freq: Two times a day (BID) | ORAL | 0 refills | Status: AC
Start: 2023-12-09 — End: 2023-12-16

## 2023-12-09 MED ORDER — LIDOCAINE HCL (PF) 1 % IJ SOLN
5.0000 mL | Freq: Once | INTRAMUSCULAR | Status: AC
  Administered 2023-12-09: 5 mL
  Filled 2023-12-09: qty 5

## 2023-12-09 MED ORDER — OXYCODONE-ACETAMINOPHEN 5-325 MG PO TABS
1.0000 | ORAL_TABLET | Freq: Once | ORAL | Status: AC
  Administered 2023-12-09: 1 via ORAL
  Filled 2023-12-09: qty 1

## 2023-12-09 MED ORDER — DOXYCYCLINE HYCLATE 100 MG PO TABS
100.0000 mg | ORAL_TABLET | Freq: Once | ORAL | Status: AC
  Administered 2023-12-09: 100 mg via ORAL
  Filled 2023-12-09: qty 1

## 2023-12-09 NOTE — ED Triage Notes (Signed)
 Pt POV from home c/o bartholin gland cyst that appeared 8-10 days ago, stating increased pain and inflammation over the last 24 hours.

## 2023-12-09 NOTE — Discharge Instructions (Addendum)
 You were seen in the emergency department for your Bartholin's gland cyst.  This did not appear infected and we were able to drain it for you in the emergency department.  You should continue to do warm compresses at home for 15 to 20 minutes at a time 2-3 times a day to help encourage drainage.  I have also given you a course of antibiotics he should complete this as prescribed.  You should have this rechecked by your primary doctor or OB/GYN in the next 2 to 3 days to make sure that it is improving.  You should return to the emergency department if you have fevers despite the antibiotics, spreading redness from the site, severe pain or any other new or concerning symptoms.

## 2023-12-09 NOTE — ED Provider Notes (Signed)
 [place] EMERGENCY DEPARTMENT AT [facility] Provider Note   CSN: [id number] Arrival date & time: 12/09/23  0734     History  Chief Complaint  Patient presents with   Cyst    Alicia Lucero is a 63 y.o. female.  Patient is a 63 year old female with a past medical history of diabetes and obesity presenting to the emergency department with vaginal swelling.  Patient states for last 8 to 10 days she has had swelling on her outer lip of her vagina.  She states that she has had Bartholin gland cysts in the past and that this does feel similar.  She states that last night it became much more swollen and painful than it has been.  She states that it did have a little bit of bleeding but otherwise no significant drainage.  She denies any vaginal bleeding or discharge, dysuria or hematuria.  She states that she is not sexually active.  She states that she did see her endocrinologist yesterday and that her A1c has gone up in the last year.  She denies any associated fevers, abdominal pain or vomiting.  The history is provided by the patient.       Home Medications Prior to Admission medications   Medication Sig Start Date End Date Taking? Authorizing Provider  doxycycline (VIBRAMYCIN) 100 MG capsule Take 1 capsule (100 mg total) by mouth 2 (two) times daily for 7 days. 12/09/23 12/16/23 Yes Kingsley, Benetta Spar K, DO  ACCU-CHEK AVIVA PLUS test strip USE AS DIRECTED 05/07/14   [name], MD  ACCU-CHEK SOFTCLIX LANCETS lancets USE AS DIRECTED    Rhoderick Moody M, PA-C  albuterol (VENTOLIN HFA) 108 (90 Base) MCG/ACT inhaler inhale 2 puffs by mouth every 4 hours if needed for cough wheezing or shortness of breath 12/25/17   Ofilia Neas, PA-C  atomoxetine (STRATTERA) 80 MG capsule Take 80 mg by mouth daily. 12/22/22   [provider]  Cholecalciferol (VITAMIN D) 2000 units tablet Take 1 tablet (2,000 Units total) by mouth daily. 06/01/17   Quillian Quince D, MD  esomeprazole  (NEXIUM) 40 MG capsule Take by mouth. 11/28/19   [provider]  gabapentin (NEURONTIN) 600 MG tablet Take 600 mg by mouth 2 (two) times daily. 04/21/18   [provider]  GLUCOSA-CHONDR-NA CHONDR-MSM PO Take by mouth.    [provider]  insulin glargine (LANTUS) 100 UNIT/ML Solostar Pen Inject 64 Units into the skin daily. 07/03/22   Dulce Sellar, NP  Insulin Pen Needle (B-D ULTRAFINE III SHORT PEN) 31G X 8 MM MISC daily 03/12/22   Dulce Sellar, NP  ketoconazole (NIZORAL) 2 % cream Apply 1 application topically 2 (two) times daily. 07/19/15   [name], MD  lamoTRIgine (LAMICTAL) 200 MG tablet Take 2 tablets (400 mg total) by mouth every evening. 02/11/16   Ethelda Chick, MD  lisinopril (ZESTRIL) 5 MG tablet Take 1 tablet (5 mg total) by mouth daily. 10/12/23   Dulce Sellar, NP  metFORMIN (GLUCOPHAGE-XR) 500 MG 24 hr tablet Take 1 tablet (500 mg total) by mouth 2 (two) times daily with a meal. 09/10/23   Dulce Sellar, NP  Multiple Vitamins-Minerals (WOMENS MULTIVITAMIN PLUS PO) Take 1 tablet by mouth daily.     [provider]  ondansetron (ZOFRAN-ODT) 4 MG disintegrating tablet Take 1 tablet (4 mg total) by mouth every 8 (eight) hours as needed. 05/02/22   Long, Arlyss Repress, MD  OZEMPIC, 2 MG/DOSE, 8 MG/3ML SOPN INJECT 2 MG INTO THE  SKIN ONCE WEEKLY 10/21/23   Hudnell, Judeth Cornfield, NP  REXULTI 1 MG TABS tablet Take 1 mg by mouth daily. 07/24/21   [provider]  rosuvastatin (CRESTOR) 20 MG tablet Take 1 tablet (20 mg total) by mouth daily. 10/12/23   Dulce Sellar, NP  sertraline (ZOLOFT) 100 MG tablet Take 2 tablets (200 mg total) by mouth daily. 02/11/16   Ethelda Chick, MD  traZODone (DESYREL) 50 MG tablet Take 25-50 mg by mouth at bedtime as needed for sleep. 12/22/22   [provider]      Allergies    Empagliflozin, Empagliflozin-linagliptin, and Other    Review of Systems   Review of Systems  Physical  Exam Updated Vital Signs BP (!) 140/66 (BP Location: Right Arm)   Pulse 77   Temp 98.1 F (36.7 C) (Oral)   Resp 18   Ht 5\' 5"  (1.651 m)   Wt (!) 154.2 kg   LMP 03/28/2018   SpO2 98%   BMI 56.58 kg/m  Physical Exam Vitals and nursing note reviewed.  Constitutional:      General: She is not in acute distress.    Appearance: Normal appearance. She is obese.  HENT:     Head: Normocephalic and atraumatic.     Nose: Nose normal.     Mouth/Throat:     Mouth: Mucous membranes are moist.  Eyes:     Extraocular Movements: Extraocular movements intact.     Conjunctiva/sclera: Conjunctivae normal.  Cardiovascular:     Rate and Rhythm: Normal rate and regular rhythm.     Heart sounds: Normal heart sounds.  Pulmonary:     Effort: Pulmonary effort is normal.     Breath sounds: Normal breath sounds.  Abdominal:     General: Abdomen is flat.     Palpations: Abdomen is soft.     Tenderness: There is no abdominal tenderness.  Musculoskeletal:        General: Normal range of motion.     Cervical back: Normal range of motion.  Skin:    General: Skin is warm and dry.  Neurological:     General: No focal deficit present.     Mental Status: She is alert and oriented to person, place, and time.  Psychiatric:        Mood and Affect: Mood normal.        Behavior: Behavior normal.     ED Results / Procedures / Treatments   Labs (all labs ordered are listed, but only abnormal results are displayed) Labs Reviewed - No data to display  EKG None  Radiology No results found.  Procedures .Incision and Drainage  Date/Time: 12/09/2023 8:39 AM  Performed by: Rexford Maus, DO Authorized by: Rexford Maus, DO   Consent:    Consent obtained:  Verbal   Consent given by:  Patient   Risks, benefits, and alternatives were discussed: yes     Risks discussed:  Bleeding, incomplete drainage, pain and infection   Alternatives discussed:  No treatment and delayed  treatment Universal protocol:    Procedure explained and questions answered to patient or proxy's satisfaction: yes     Patient identity confirmed:  Verbally with patient Location:    Type:  Bartholin cyst   Size:  1 cm   Location:  Anogenital   Anogenital location:  Bartholin's gland Pre-procedure details:    Skin preparation:  Chlorhexidine with alcohol Sedation:    Sedation type:  None Anesthesia:    Anesthesia method:  Local infiltration   Local anesthetic:  Lidocaine 1% w/o epi Procedure type:    Complexity:  Simple Procedure details:    Ultrasound guidance: no     Needle aspiration: no     Incision types:  Stab incision   Incision depth:  Dermal   Wound management:  Probed and deloculated   Drainage:  Bloody and purulent   Drainage amount:  Moderate   Wound treatment:  Wound left open   Packing materials:  None Post-procedure details:    Procedure completion:  Tolerated well, no immediate complications     Medications Ordered in ED Medications  oxyCODONE-acetaminophen (PERCOCET/ROXICET) 5-325 MG per tablet 1 tablet (1 tablet Oral Given 12/09/23 0801)  lidocaine (PF) (XYLOCAINE) 1 % injection 5 mL (5 mLs Infiltration Given by Other 12/09/23 0802)  doxycycline (VIBRA-TABS) tablet 100 mg (100 mg Oral Given 12/09/23 0843)    ED Course/ Medical Decision Making/ A&P Clinical Course as of 12/09/23 0844  Wed Dec 09, 2023  0840 Pelvic exam performed by myself, Redge Gainer RN chaparoned. Patient had ~1 cm palpable fluctuance of the R labia majora consistent with Bartholin's gland abscess, no surrounding erythema, warmth or active drainage. Please see procedure note for I&D. Will cover with doxycycline as patient is diabetic and high risk of infection. She is stable for discharge home with outpatient follow up and was given strict return precautions. [VK]    Clinical Course User Index [VK] Rexford Maus, DO                                 Medical Decision Making This  patient presents to the ED with chief complaint(s) of vaginal swelling with pertinent past medical history of diabetes, obesity which further complicates the presenting complaint. The complaint involves an extensive differential diagnosis and also carries with it a high risk of complications and morbidity.    The differential diagnosis includes Bartholin glands abscess, other abscess, cellulitis, no fevers and not ill-appearing with symptoms ongoing for several days making and necrotizing fasciitis unlikely  Additional history obtained: Additional history obtained from N/A Records reviewed Care Everywhere/External Records and Primary Care Documents  ED Course and Reassessment: On patient's arrival she is hemodynamically stable in no acute distress.  Will a pelvic exam performed to evaluate for Bartholin glands abscess versus other infection.  Likely will require I&D.  Independent labs interpretation:  N/A  Independent visualization of imaging: -N/A  Consultation: - Consulted or discussed management/test interpretation w/ external professional: N/A  Consideration for admission or further workup: Patient has no emergent conditions requiring admission or further work-up at this time and is stable for discharge home with primary care follow-up  Social Determinants of health: N/A    Risk Prescription drug management.          Final Clinical Impression(s) / ED Diagnoses Final diagnoses:  Bartholin's gland abscess    Rx / DC Orders ED Discharge Orders          Ordered    doxycycline (VIBRAMYCIN) 100 MG capsule  2 times daily        12/09/23 0844              Rexford Maus, DO 12/09/23 0845

## 2023-12-10 ENCOUNTER — Other Ambulatory Visit: Payer: Self-pay

## 2023-12-10 ENCOUNTER — Encounter (HOSPITAL_BASED_OUTPATIENT_CLINIC_OR_DEPARTMENT_OTHER): Payer: Self-pay

## 2023-12-10 ENCOUNTER — Emergency Department (HOSPITAL_BASED_OUTPATIENT_CLINIC_OR_DEPARTMENT_OTHER)
Admission: EM | Admit: 2023-12-10 | Discharge: 2023-12-10 | Disposition: A | Discharge status: Home / Self Care | Attending: Emergency Medicine | Admitting: Emergency Medicine

## 2023-12-10 DIAGNOSIS — Z79899 Other long term (current) drug therapy: Secondary | ICD-10-CM | POA: Diagnosis not present

## 2023-12-10 DIAGNOSIS — I10 Essential (primary) hypertension: Secondary | ICD-10-CM | POA: Insufficient documentation

## 2023-12-10 DIAGNOSIS — N907 Vulvar cyst: Secondary | ICD-10-CM | POA: Diagnosis not present

## 2023-12-10 DIAGNOSIS — E119 Type 2 diabetes mellitus without complications: Secondary | ICD-10-CM | POA: Diagnosis not present

## 2023-12-10 DIAGNOSIS — Z794 Long term (current) use of insulin: Secondary | ICD-10-CM | POA: Insufficient documentation

## 2023-12-10 DIAGNOSIS — F411 Generalized anxiety disorder: Secondary | ICD-10-CM | POA: Diagnosis not present

## 2023-12-10 DIAGNOSIS — Z7984 Long term (current) use of oral hypoglycemic drugs: Secondary | ICD-10-CM | POA: Diagnosis not present

## 2023-12-10 DIAGNOSIS — F3175 Bipolar disorder, in partial remission, most recent episode depressed: Secondary | ICD-10-CM | POA: Diagnosis not present

## 2023-12-10 NOTE — Discharge Instructions (Addendum)
-   Pick up doxycycline prescription, 1 tablet twice daily for 7 days - Tylenol and ibuprofen for pain, you can use ice or warm baths/showers - Follow-up with PCP, strongly recommend referral to gynecology in case further intervention is needed - Seek medical attention if you develop fever, nausea/vomiting, severe/worsening pain

## 2023-12-10 NOTE — ED Provider Notes (Signed)
 [place] EMERGENCY DEPARTMENT AT [facility] Provider Note   CSN: [id number] Arrival date & time: 12/10/23  4098     History  Chief Complaint  Patient presents with   Cyst    Alicia Lucero is a 63 y.o. female with history significant for type 2 diabetes, hypertension, PCOS.  Patient was seen by ED provider on 11/08/2018 for [facility] for vaginal swelling on right labia majora.  She underwent I&D in the ED, patient tolerated procedure well, was discharged home with doxycycline.  Today she presents for: She feels the swelling is "higher-up" on the labia than yesterday. Pain is the same as yesterday, painful to sit. No fevers, chills, cough, NVD. Took 1 dose of doxycycline while here in ED, but pharmacy has not filled prescription per patient. Drainage from incision yesterday, but has since subsided.      Home Medications Prior to Admission medications   Medication Sig Start Date End Date Taking? Authorizing Provider  TRINTELLIX 10 MG TABS tablet Take 10 mg by mouth daily. 11/21/23  Yes [provider]  VYVANSE 60 MG capsule Take 60 mg by mouth every morning. 11/23/23  Yes [provider]  ACCU-CHEK AVIVA PLUS test strip USE AS DIRECTED 05/07/14   [name], MD  ACCU-CHEK SOFTCLIX LANCETS lancets USE AS DIRECTED    Rhoderick Moody M, PA-C  albuterol (VENTOLIN HFA) 108 (90 Base) MCG/ACT inhaler inhale 2 puffs by mouth every 4 hours if needed for cough wheezing or shortness of breath 12/25/17   Ofilia Neas, PA-C  atomoxetine (STRATTERA) 80 MG capsule Take 80 mg by mouth daily. 12/22/22   [provider]  Cholecalciferol (VITAMIN D) 2000 units tablet Take 1 tablet (2,000 Units total) by mouth daily. 06/01/17   Quillian Quince D, MD  doxycycline (VIBRAMYCIN) 100 MG capsule Take 1 capsule (100 mg total) by mouth 2 (two) times daily for 7 days. 12/09/23 12/16/23  Rexford Maus, DO  esomeprazole (NEXIUM) 40 MG capsule Take by mouth. 11/28/19    [provider]  gabapentin (NEURONTIN) 600 MG tablet Take 600 mg by mouth 2 (two) times daily. 04/21/18   [provider]  GLUCOSA-CHONDR-NA CHONDR-MSM PO Take by mouth.    [provider]  insulin glargine (LANTUS) 100 UNIT/ML Solostar Pen Inject 64 Units into the skin daily. 07/03/22   Dulce Sellar, NP  Insulin Pen Needle (B-D ULTRAFINE III SHORT PEN) 31G X 8 MM MISC daily 03/12/22   Dulce Sellar, NP  ketoconazole (NIZORAL) 2 % cream Apply 1 application topically 2 (two) times daily. 07/19/15   [name], MD  lamoTRIgine (LAMICTAL) 200 MG tablet Take 2 tablets (400 mg total) by mouth every evening. 02/11/16   Ethelda Chick, MD  lisinopril (ZESTRIL) 5 MG tablet Take 1 tablet (5 mg total) by mouth daily. 10/12/23   Dulce Sellar, NP  metFORMIN (GLUCOPHAGE-XR) 500 MG 24 hr tablet Take 1 tablet (500 mg total) by mouth 2 (two) times daily with a meal. 09/10/23   Dulce Sellar, NP  Multiple Vitamins-Minerals (WOMENS MULTIVITAMIN PLUS PO) Take 1 tablet by mouth daily.     [provider]  ondansetron (ZOFRAN-ODT) 4 MG disintegrating tablet Take 1 tablet (4 mg total) by mouth every 8 (eight) hours as needed. 05/02/22   Long, Arlyss Repress, MD  OZEMPIC, 2 MG/DOSE, 8 MG/3ML SOPN INJECT 2 MG INTO THE SKIN ONCE WEEKLY 10/21/23   Hudnell, Judeth Cornfield, NP  REXULTI 1 MG TABS tablet Take 1 mg by mouth daily. 07/24/21  [provider]  rosuvastatin (CRESTOR) 20 MG tablet Take 1 tablet (20 mg total) by mouth daily. 10/12/23   Dulce Sellar, NP  sertraline (ZOLOFT) 100 MG tablet Take 2 tablets (200 mg total) by mouth daily. 02/11/16   Ethelda Chick, MD  traZODone (DESYREL) 50 MG tablet Take 25-50 mg by mouth at bedtime as needed for sleep. 12/22/22   [provider]      Allergies    Empagliflozin, Empagliflozin-linagliptin, and Other    Review of Systems   Review of Systems  Constitutional:  Negative for fever.  Respiratory:  Negative for  cough.   Cardiovascular:  Negative for chest pain.  Gastrointestinal:  Negative for abdominal pain, diarrhea, nausea and vomiting.  Genitourinary:  Positive for vaginal pain. Negative for difficulty urinating, dysuria, vaginal bleeding and vaginal discharge.    Physical Exam Updated Vital Signs BP (!) 121/54   Pulse 74   Temp 98.1 F (36.7 C)   Resp 18   Ht 5\' 5"  (1.651 m)   Wt (!) 154 kg   LMP 03/28/2018   SpO2 98%   BMI 56.50 kg/m  Physical Exam HENT:     Head: Normocephalic and atraumatic.  Cardiovascular:     Rate and Rhythm: Normal rate and regular rhythm.     Pulses: Normal pulses.     Heart sounds: Normal heart sounds.  Pulmonary:     Effort: Pulmonary effort is normal.     Breath sounds: Normal breath sounds.  Abdominal:     General: Abdomen is flat. Bowel sounds are normal.  Genitourinary:    Comments: Nurse Merry Proud present as chaperone during GU exam Normal-appearing labia, no discharge, approximately 2x 2 cm cyst on right sided inferior vaginal wall, no bleeding, no drainage.  No evidence of extending infection.    ED Results / Procedures / Treatments   Labs (all labs ordered are listed, but only abnormal results are displayed) Labs Reviewed - No data to display  EKG None  Radiology No results found.  Procedures Procedures    Medications Ordered in ED Medications - No data to display  ED Course/ Medical Decision Making/ A&P                                 Medical Decision Making 63 year old female with cystic lesion on labia majora, s/p I&D 4/2/325.  Differential includes: Bartholin cyst, epidermoid cyst, abscess, hidradenitis suppurativa.  Very low suspicion for cellulitis without signs/symptoms of infection on exam.  S/P 1 I&D in the ED, no indication for repeat I&D today, recommend follow-up with PCP and gynecology referral.  Pain is well-controlled, continue OTC Tylenol or Profen at home.  Recommend patient pick up doxycycline prescription,  and take this twice daily for 7 days. Patient is hemodynamically and medically stable for discharge from ED with close follow-up outpatient.  Patient expressed understanding and agreement with plan.         Final Clinical Impression(s) / ED Diagnoses Final diagnoses:  Labial cyst    Rx / DC Orders ED Discharge Orders     None         Tiffany Kocher, DO 12/10/23 0981    Linwood Dibbles, MD 12/11/23 [phone]

## 2023-12-10 NOTE — ED Triage Notes (Signed)
 Pt POV from home c/o increasing discomfort from bartholin's gland cyst. Pt was evaluated here at [facility] yesterday for same, I&D performed and abx given. Pt states concern for increased swelling and minimal drainage from incision

## 2023-12-14 ENCOUNTER — Inpatient Hospital Stay: Admitting: Family

## 2023-12-25 NOTE — Telephone Encounter (Signed)
 Medication Access Center Summary  PA Status Denied  Medication Ozempic  (2 MG/DOSE) 8MG JONELLE  Action Item - Appeal Info Phone number: [phone] Fax number: [phone] Member ID: [id number] L Ref#: [id number]  Reason for Denial Per your health plan's criteria, this drug is covered if you meet the following: Your doctor tells us  that this drug is working for your condition  Insurance-Mandated Formulary Alternatives N/A   Insurance Company [facility]  Provider Elsie Sharps   Denial letter saved to Media tab

## 2023-12-25 NOTE — Telephone Encounter (Signed)
 This was denied because patient A1C has increased. If you would like patient to stay on medication we can submit an appeal. A letter of medical necessity and have patient sign the form on the last page of the denial letter that's uploaded into media. Thanks

## 2023-12-31 DIAGNOSIS — M170 Bilateral primary osteoarthritis of knee: Secondary | ICD-10-CM | POA: Diagnosis not present

## 2024-01-05 ENCOUNTER — Encounter: Payer: Self-pay | Admitting: Family

## 2024-01-05 ENCOUNTER — Other Ambulatory Visit: Payer: Self-pay | Admitting: Family

## 2024-01-05 DIAGNOSIS — E1140 Type 2 diabetes mellitus with diabetic neuropathy, unspecified: Secondary | ICD-10-CM

## 2024-01-11 ENCOUNTER — Other Ambulatory Visit: Payer: Self-pay | Admitting: Family

## 2024-01-11 DIAGNOSIS — E1140 Type 2 diabetes mellitus with diabetic neuropathy, unspecified: Secondary | ICD-10-CM

## 2024-01-12 ENCOUNTER — Telehealth: Payer: Self-pay

## 2024-01-12 NOTE — Telephone Encounter (Signed)
 Pharmacy Patient Advocate Encounter   Received notification from CoverMyMeds that prior authorization for Ozempic  (2 MG/DOSE) 8MG /3ML pen-injectors  is required/requested.   Insurance verification completed.   The patient is insured through [facility] .   Per test claim: PA required; PA started via CoverMyMeds. KEY [id number] . Waiting for clinical questions to populate.

## 2024-01-13 ENCOUNTER — Other Ambulatory Visit (HOSPITAL_COMMUNITY): Payer: Self-pay

## 2024-01-13 NOTE — Telephone Encounter (Signed)
 Pharmacy Patient Advocate Encounter  Received notification from [employer] that Prior Authorization for Ozempic  (2 MG/DOSE) 8MG /3ML pen-injectors has been APPROVED from 01/12/2024. Ran test claim, Copay is $4.00.  per 28 days.  This test claim was processed through [facility]- copay amounts may vary at other pharmacies due to pharmacy/plan contracts, or as the patient moves through the different stages of their insurance plan.

## 2024-01-13 NOTE — Telephone Encounter (Signed)
 please be sure pt aware, thx

## 2024-01-18 DIAGNOSIS — M170 Bilateral primary osteoarthritis of knee: Secondary | ICD-10-CM | POA: Diagnosis not present

## 2024-01-30 ENCOUNTER — Other Ambulatory Visit: Payer: Self-pay | Admitting: Family

## 2024-01-30 DIAGNOSIS — E1159 Type 2 diabetes mellitus with other circulatory complications: Secondary | ICD-10-CM

## 2024-02-02 DIAGNOSIS — F3175 Bipolar disorder, in partial remission, most recent episode depressed: Secondary | ICD-10-CM | POA: Diagnosis not present

## 2024-02-02 DIAGNOSIS — F50811 Binge eating disorder, moderate: Secondary | ICD-10-CM | POA: Diagnosis not present

## 2024-02-02 DIAGNOSIS — F411 Generalized anxiety disorder: Secondary | ICD-10-CM | POA: Diagnosis not present

## 2024-02-02 DIAGNOSIS — F4323 Adjustment disorder with mixed anxiety and depressed mood: Secondary | ICD-10-CM | POA: Diagnosis not present

## 2024-02-10 DIAGNOSIS — N950 Postmenopausal bleeding: Secondary | ICD-10-CM | POA: Diagnosis not present

## 2024-02-10 DIAGNOSIS — Z124 Encounter for screening for malignant neoplasm of cervix: Secondary | ICD-10-CM | POA: Diagnosis not present

## 2024-02-10 DIAGNOSIS — Z0001 Encounter for general adult medical examination with abnormal findings: Secondary | ICD-10-CM | POA: Diagnosis not present

## 2024-02-12 ENCOUNTER — Emergency Department (HOSPITAL_COMMUNITY)
Admission: EM | Admit: 2024-02-12 | Discharge: 2024-02-12 | Disposition: A | Discharge status: Home / Self Care | Attending: Emergency Medicine | Admitting: Emergency Medicine

## 2024-02-12 ENCOUNTER — Emergency Department (HOSPITAL_COMMUNITY)

## 2024-02-12 ENCOUNTER — Other Ambulatory Visit: Payer: Self-pay

## 2024-02-12 DIAGNOSIS — R1032 Left lower quadrant pain: Secondary | ICD-10-CM | POA: Diagnosis present

## 2024-02-12 DIAGNOSIS — N132 Hydronephrosis with renal and ureteral calculous obstruction: Secondary | ICD-10-CM | POA: Diagnosis not present

## 2024-02-12 DIAGNOSIS — K5730 Diverticulosis of large intestine without perforation or abscess without bleeding: Secondary | ICD-10-CM | POA: Diagnosis not present

## 2024-02-12 DIAGNOSIS — R109 Unspecified abdominal pain: Secondary | ICD-10-CM | POA: Diagnosis not present

## 2024-02-12 DIAGNOSIS — E119 Type 2 diabetes mellitus without complications: Secondary | ICD-10-CM | POA: Insufficient documentation

## 2024-02-12 DIAGNOSIS — N23 Unspecified renal colic: Secondary | ICD-10-CM

## 2024-02-12 DIAGNOSIS — Z7984 Long term (current) use of oral hypoglycemic drugs: Secondary | ICD-10-CM | POA: Diagnosis not present

## 2024-02-12 DIAGNOSIS — Z794 Long term (current) use of insulin: Secondary | ICD-10-CM | POA: Diagnosis not present

## 2024-02-12 DIAGNOSIS — F3181 Bipolar II disorder: Secondary | ICD-10-CM | POA: Diagnosis not present

## 2024-02-12 LAB — COMPREHENSIVE METABOLIC PANEL WITH GFR
ALT: 28 U/L (ref 0–44)
AST: 25 U/L (ref 15–41)
Albumin: 3.8 g/dL (ref 3.5–5.0)
Alkaline Phosphatase: 82 U/L (ref 38–126)
Anion gap: 10 (ref 5–15)
BUN: 9 mg/dL (ref 6–20)
CO2: 25 mmol/L (ref 22–32)
Calcium: 9.1 mg/dL (ref 8.9–10.3)
Chloride: 104 mmol/L (ref 98–111)
Creatinine, Ser: 0.82 mg/dL (ref 0.44–1.00)
GFR, Estimated: >60 mL/min (ref >60)
Glucose, Bld: 92 mg/dL (ref 70–99)
Potassium: 3.5 mmol/L (ref 3.5–5.1)
Sodium: 139 mmol/L (ref 135–145)
Total Bilirubin: 0.7 mg/dL (ref 0.0–1.2)
Total Protein: 7.2 g/dL (ref 6.5–8.1)

## 2024-02-12 LAB — CBC WITH DIFFERENTIAL/PLATELET
Abs Immature Granulocytes: 0.07 10*3/uL (ref 0.00–0.07)
Basophils Absolute: 0.1 10*3/uL (ref 0.0–0.1)
Basophils Relative: 1 %
Eosinophils Absolute: 0.7 10*3/uL — ABNORMAL HIGH (ref 0.0–0.5)
Eosinophils Relative: 6 %
HCT: 46.5 % — ABNORMAL HIGH (ref 36.0–46.0)
Hemoglobin: 14.7 g/dL (ref 12.0–15.0)
Immature Granulocytes: 1 %
Lymphocytes Relative: 28 %
Lymphs Abs: 3.6 10*3/uL (ref 0.7–4.0)
MCH: 28.8 pg (ref 26.0–34.0)
MCHC: 31.6 g/dL (ref 30.0–36.0)
MCV: 91.2 fL (ref 80.0–100.0)
Monocytes Absolute: 0.8 10*3/uL (ref 0.1–1.0)
Monocytes Relative: 6 %
Neutro Abs: 7.5 10*3/uL (ref 1.7–7.7)
Neutrophils Relative %: 58 %
Platelets: 405 10*3/uL — ABNORMAL HIGH (ref 150–400)
RBC: 5.1 MIL/uL (ref 3.87–5.11)
RDW: 14.5 % (ref 11.5–15.5)
WBC: 12.8 10*3/uL — ABNORMAL HIGH (ref 4.0–10.5)
nRBC: 0 % (ref 0.0–0.2)

## 2024-02-12 LAB — I-STAT CHEM 8, ED
BUN: 7 mg/dL (ref 6–20)
Calcium, Ion: 1.17 mmol/L (ref 1.15–1.40)
Chloride: 102 mmol/L (ref 98–111)
Creatinine, Ser: 0.9 mg/dL (ref 0.44–1.00)
Glucose, Bld: 89 mg/dL (ref 70–99)
HCT: 40 % (ref 36.0–46.0)
Hemoglobin: 13.6 g/dL (ref 12.0–15.0)
Potassium: 3.3 mmol/L — ABNORMAL LOW (ref 3.5–5.1)
Sodium: 140 mmol/L (ref 135–145)
TCO2: 28 mmol/L (ref 22–32)

## 2024-02-12 LAB — URINALYSIS, ROUTINE W REFLEX MICROSCOPIC
Bilirubin Urine: NEGATIVE
Glucose, UA: NEGATIVE mg/dL
Hgb urine dipstick: NEGATIVE
Ketones, ur: NEGATIVE mg/dL
Nitrite: NEGATIVE
Protein, ur: 30 mg/dL — AB
Specific Gravity, Urine: 1.026 (ref 1.005–1.030)
pH: 5 (ref 5.0–8.0)

## 2024-02-12 LAB — LIPASE, BLOOD: Lipase: 28 U/L (ref 11–51)

## 2024-02-12 MED ORDER — ONDANSETRON 4 MG PO TBDP: ORAL_TABLET | ORAL | 0 refills | Status: AC

## 2024-02-12 MED ORDER — IOHEXOL 300 MG/ML  SOLN
100.0000 mL | Freq: Once | INTRAMUSCULAR | Status: AC | PRN
  Administered 2024-02-12: 100 mL via INTRAVENOUS

## 2024-02-12 MED ORDER — HYDROMORPHONE HCL 1 MG/ML IJ SOLN
0.5000 mg | Freq: Once | INTRAMUSCULAR | Status: AC
  Administered 2024-02-12: 0.5 mg via INTRAVENOUS
  Filled 2024-02-12: qty 1

## 2024-02-12 MED ORDER — OXYCODONE-ACETAMINOPHEN 5-325 MG PO TABS: 1.0000 | ORAL_TABLET | Freq: Four times a day (QID) | ORAL | 0 refills | Status: DC | PRN

## 2024-02-12 MED ORDER — TAMSULOSIN HCL 0.4 MG PO CAPS: 0.4000 mg | ORAL_CAPSULE | Freq: Every day | ORAL | 0 refills | Status: DC

## 2024-02-12 MED ORDER — ONDANSETRON HCL 4 MG/2ML IJ SOLN
4.0000 mg | Freq: Once | INTRAMUSCULAR | Status: AC
  Administered 2024-02-12: 4 mg via INTRAVENOUS
  Filled 2024-02-12: qty 2

## 2024-02-12 NOTE — ED Notes (Signed)
 IV access attempted x3 by this medic. Pt stated she is normally a very hard stick and her veins roll or blow. She stated she has had to have USGPIV in the past. Will ask another team member for assistance.

## 2024-02-12 NOTE — ED Provider Notes (Signed)
 [place] EMERGENCY DEPARTMENT AT [facility] Provider Note   CSN: [id number] Arrival date & time: 02/12/24  1051     History  Chief Complaint  Patient presents with   Abdominal Pain    Pt reports L sided constant abdominal pain that has been ongoing for approx 36 hours. Has seen OBGYN recently for pelvic pain. Started taking ozempic  approx 2 years ago, reports n/v but states pain this severe is new. Denies urinary problems, denies blood in stool.    Alicia Lucero is a 63 y.o. female.   Abdominal Pain    Patient has a history of diabetes bipolar disorder acid reflux hyperlipidemia polycystic ovarian syndrome sciatica and prior cholecystectomy who presents ED with complaints of abdominal pain.  Patient states she started having pain yesterday.  Patient states the pain is sharp in the left side.  Has become more intense.  She denies any vomiting or diarrhea.  No dysuria.  Home Medications Prior to Admission medications   Medication Sig Start Date End Date Taking? Authorizing Provider  ACCU-CHEK AVIVA PLUS test strip USE AS DIRECTED 05/07/14   Dain Drown, MD  ACCU-CHEK SOFTCLIX LANCETS lancets USE AS DIRECTED    Devonne Folk M, PA-C  albuterol  (VENTOLIN  HFA) 108 (90 Base) MCG/ACT inhaler inhale 2 puffs by mouth every 4 hours if needed for cough wheezing or shortness of breath 12/25/17   Bettie Bruce, PA-C  atomoxetine (STRATTERA) 80 MG capsule Take 80 mg by mouth daily. 12/22/22   [provider]  Cholecalciferol (VITAMIN D ) 2000 units tablet Take 1 tablet (2,000 Units total) by mouth daily. 06/01/17   Jasmine Mesi D, MD  esomeprazole (NEXIUM) 40 MG capsule Take by mouth. 11/28/19   [provider]  gabapentin  (NEURONTIN ) 600 MG tablet Take 600 mg by mouth 2 (two) times daily. 04/21/18   [provider]  GLUCOSA-CHONDR-NA CHONDR-MSM PO Take by mouth.    [provider]  insulin  glargine (LANTUS ) 100 UNIT/ML Solostar Pen Inject 64  Units into the skin daily. 07/03/22   Versa Gore, NP  Insulin  Pen Needle (B-D ULTRAFINE III SHORT PEN) 31G X 8 MM MISC daily 03/12/22   Versa Gore, NP  ketoconazole  (NIZORAL ) 2 % cream Apply 1 application topically 2 (two) times daily. 07/19/15   Dain Drown, MD  lamoTRIgine  (LAMICTAL ) 200 MG tablet Take 2 tablets (400 mg total) by mouth every evening. 02/11/16   Valere Gata, MD  lisinopril  (ZESTRIL ) 5 MG tablet TAKE 1 TABLET(5 MG) BY MOUTH DAILY 02/02/24   Versa Gore, NP  metFORMIN  (GLUCOPHAGE -XR) 500 MG 24 hr tablet Take 1 tablet (500 mg total) by mouth 2 (two) times daily with a meal. 09/10/23   Versa Gore, NP  Multiple Vitamins-Minerals (WOMENS MULTIVITAMIN PLUS PO) Take 1 tablet by mouth daily.     [provider]  ondansetron  (ZOFRAN -ODT) 4 MG disintegrating tablet Take 1 tablet (4 mg total) by mouth every 8 (eight) hours as needed. 05/02/22   Long, Joshua G, MD  OZEMPIC , 2 MG/DOSE, 8 MG/3ML SOPN INJECT 2 MG INTO THE SKIN ONCE WEEKLY 10/21/23   Hudnell, Trevor Fudge, NP  REXULTI 1 MG TABS tablet Take 1 mg by mouth daily. 07/24/21   [provider]  rosuvastatin  (CRESTOR ) 20 MG tablet Take 1 tablet (20 mg total) by mouth daily. 10/12/23   Versa Gore, NP  sertraline  (ZOLOFT ) 100 MG tablet Take 2 tablets (200 mg total) by mouth daily. 02/11/16   Smith, Kristi M, MD  traZODone  (DESYREL )  50 MG tablet Take 25-50 mg by mouth at bedtime as needed for sleep. 12/22/22   [provider]  TRINTELLIX 10 MG TABS tablet Take 10 mg by mouth daily. 11/21/23   [provider]  VYVANSE 60 MG capsule Take 60 mg by mouth every morning. 11/23/23   [provider]      Allergies    Codeine , Doxycycline , Empagliflozin , Empagliflozin -linagliptin , and Other    Review of Systems   Review of Systems  Gastrointestinal:  Positive for abdominal pain.    Physical Exam Updated Vital Signs BP (!) 150/100 (BP Location: Left Arm)   Pulse 90    Temp 98.4 F (36.9 C) (Oral)   Resp 16   LMP 03/28/2018   SpO2 97%  Physical Exam Vitals and nursing note reviewed.  Constitutional:      General: She is not in acute distress.    Appearance: She is well-developed.     Comments: Increased BMI  HENT:     Head: Normocephalic and atraumatic.     Right Ear: External ear normal.     Left Ear: External ear normal.  Eyes:     General: No scleral icterus.       Right eye: No discharge.        Left eye: No discharge.     Conjunctiva/sclera: Conjunctivae normal.  Neck:     Trachea: No tracheal deviation.  Cardiovascular:     Rate and Rhythm: Normal rate and regular rhythm.  Pulmonary:     Effort: Pulmonary effort is normal. No respiratory distress.     Breath sounds: Normal breath sounds. No stridor. No wheezing or rales.  Abdominal:     General: Bowel sounds are normal. There is no distension.     Palpations: Abdomen is soft.     Tenderness: There is abdominal tenderness in the left lower quadrant. There is guarding. There is no rebound.  Musculoskeletal:        General: No tenderness or deformity.     Cervical back: Neck supple.  Skin:    General: Skin is warm and dry.     Findings: No rash.  Neurological:     General: No focal deficit present.     Mental Status: She is alert.     Cranial Nerves: No cranial nerve deficit, dysarthria or facial asymmetry.     Sensory: No sensory deficit.     Motor: No abnormal muscle tone or seizure activity.     Coordination: Coordination normal.  Psychiatric:        Mood and Affect: Mood normal.     ED Results / Procedures / Treatments   Labs (all labs ordered are listed, but only abnormal results are displayed) Labs Reviewed  COMPREHENSIVE METABOLIC PANEL WITH GFR  LIPASE, BLOOD  CBC WITH DIFFERENTIAL/PLATELET  URINALYSIS, ROUTINE W REFLEX MICROSCOPIC    EKG None  Radiology No results found.  Procedures Procedures    Medications Ordered in ED Medications  HYDROmorphone   (DILAUDID ) injection 0.5 mg (has no administration in time range)  ondansetron  (ZOFRAN ) injection 4 mg (has no administration in time range)    ED Course/ Medical Decision Making/ A&P                                 Medical Decision Making Amount and/or Complexity of Data Reviewed Labs: ordered. Decision-making details documented in ED Course. Radiology: ordered.  Risk Prescription drug management. Parenteral  controlled substances.   Pt presents with pain lower abdomen.  Possible diverticulitis, ureterolithiasis.  Afebrile non toxic in the ED.  LAbs ordered, plan on CT.  Care turned over to Dr Delana Favors at shift change.        Final Clinical Impression(s) / ED Diagnoses Final diagnoses:  None    Rx / DC Orders ED Discharge Orders     None         Trish Furl, MD 02/12/24 1657

## 2024-02-12 NOTE — ED Provider Notes (Signed)
  Physical Exam  BP 130/76 (BP Location: Left Arm)   Pulse 76   Temp 98.5 F (36.9 C) (Oral)   Resp 16   LMP 03/28/2018   SpO2 97%   Physical Exam  Procedures  Procedures  ED Course / MDM    Medical Decision Making Care assumed that 5 PM.  Patient is here with abdominal pain and vomiting.  Signout pending labs and CT abdomen pelvis  6:45 PM Labs show white blood cell count of 12.8.  Urinalysis did not show any obvious UTI.  CT abdomen pelvis showed left renal/UPJ stone.  Patient actually had a kidney stone that was diagnosed several months ago.  Since the stone is now larger and she has hydronephrosis, she will likely need procedure done by urology.  Pain is under control with pain medicine.  Will discharge home with pain medicine and Zofran  and Flomax and I told her to call urology for follow up   Problems Addressed: Renal colic: acute illness or injury  Amount and/or Complexity of Data Reviewed Labs: ordered. Decision-making details documented in ED Course. Radiology: ordered and independent interpretation performed. Decision-making details documented in ED Course.  Risk Prescription drug management.         Alicia Duck, MD 02/12/24 [phone]

## 2024-02-12 NOTE — Discharge Instructions (Signed)
 You have a large kidney stone  I have prescribed Percocet as needed for pain.  You can also take Tylenol  or Motrin  if you have mild pain  I have prescribed Zofran  for nausea I have also prescribed Flomax  You need to call your urologist on Monday for appointment  Return to ER if you have severe flank pain or vomiting or fever

## 2024-02-15 DIAGNOSIS — N200 Calculus of kidney: Secondary | ICD-10-CM | POA: Diagnosis not present

## 2024-02-16 ENCOUNTER — Other Ambulatory Visit: Payer: Self-pay | Admitting: Urology

## 2024-03-04 NOTE — Patient Instructions (Signed)
 SURGICAL WAITING ROOM VISITATION  Patients having surgery or a procedure may have no more than 2 support people in the waiting area - these visitors may rotate.    Children under the age of 67 must have an adult with them who is not the patient.  Visitors with respiratory illnesses are discouraged from visiting and should remain at home.  If the patient needs to stay at the hospital during part of their recovery, the visitor guidelines for inpatient rooms apply. Pre-op nurse will coordinate an appropriate time for 1 support person to accompany patient in pre-op.  This support person may not rotate.    Please refer to the [facility] website for the visitor guidelines for Inpatients (after your surgery is over and you are in a regular room).    Your procedure is scheduled on: Thursday, March 17, 2024   Report to [facility] Main Entrance    Report to admitting at 11:45 AM   Call this number if you have problems the morning of surgery [phone]   Do not eat food or drink:After Midnight.    Oral Hygiene is also important to reduce your risk of infection.                                    Remember - BRUSH YOUR TEETH THE MORNING OF SURGERY WITH YOUR REGULAR TOOTHPASTE  DENTURES WILL BE REMOVED PRIOR TO SURGERY PLEASE DO NOT APPLY Poly grip OR ADHESIVES!!!   Stop all vitamins and herbal supplements 7 days before surgery.   Take these medicines the morning of surgery with A SIP OF WATER: Esomeprazole            Gabapentin             Rexulti            Rosuvastatin             Sertraline             Tamsulosin             Trintellix            Bring Rescue Inhaler  DO NOT TAKE ANY ORAL DIABETIC MEDICATIONS DAY OF YOUR SURGERY                              You may not have any metal on your body including hair pins, jewelry, and body piercing             Do not wear make-up, lotions, powders, perfumes, or deodorant  Do not wear nail polish including gel and S&S,  artificial/acrylic nails, or any other type of covering on natural nails including finger and toenails. If you have artificial nails, gel coating, etc. that needs to be removed by a nail salon please have this removed prior to surgery or surgery may need to be canceled/ delayed if the surgeon/ anesthesia feels like they are unable to be safely monitored.   Do not shave  48 hours prior to surgery.    Do not bring valuables to the hospital. [facility] IS NOT             RESPONSIBLE   FOR VALUABLES.   Contacts, glasses, dentures or bridgework may not be worn into surgery.   Bring small overnight bag day of surgery.   DO NOT BRING YOUR HOME MEDICATIONS TO  THE HOSPITAL. PHARMACY WILL DISPENSE MEDICATIONS LISTED ON YOUR MEDICATION LIST TO YOU DURING YOUR ADMISSION IN THE HOSPITAL!    Patients discharged on the day of surgery will not be allowed to drive home.  Someone NEEDS to stay with you for the first 24 hours after anesthesia.   Special Instructions: Bring a copy of your healthcare power of attorney and living will documents the day of surgery if you haven't scanned them before.              Please read over the following fact sheets you were given: IF YOU HAVE QUESTIONS ABOUT YOUR PRE-OP INSTRUCTIONS PLEASE CALL [phone]   If you received a COVID test during your pre-op visit  it is requested that you wear a mask when out in public, stay away from anyone that may not be feeling well and notify your surgeon if you develop symptoms. If you test positive for Covid or have been in contact with anyone that has tested positive in the last 10 days please notify you surgeon.    How to Manage Your Diabetes Before and After Surgery  Why is it important to control my blood sugar before and after surgery? Improving blood sugar levels before and after surgery helps healing and can limit problems. A way of improving blood sugar control is eating a healthy diet by:  Eating less sugar and  carbohydrates  Increasing activity/exercise  Talking with your doctor about reaching your blood sugar goals High blood sugars (greater than 180 mg/dL) can raise your risk of infections and slow your recovery, so you will need to focus on controlling your diabetes during the weeks before surgery. Make sure that the doctor who takes care of your diabetes knows about your planned surgery including the date and location.  How do I manage my blood sugar before surgery? Check your blood sugar at least 4 times a day, starting 2 days before surgery, to make sure that the level is not too high or low. Check your blood sugar the morning of your surgery when you wake up and every 2 hours until you get to the Short Stay unit. If your blood sugar is less than 70 mg/dL, you will need to treat for low blood sugar: Do not take insulin . Treat a low blood sugar (less than 70 mg/dL) with  cup of clear juice (cranberry or apple), 4 glucose tablets, OR glucose gel. Recheck blood sugar in 15 minutes after treatment (to make sure it is greater than 70 mg/dL). If your blood sugar is not greater than 70 mg/dL on recheck, call [phone] for further instructions. Report your blood sugar to the short stay nurse when you get to Short Stay.  If you are admitted to the hospital after surgery: Your blood sugar will be checked by the staff and you will probably be given insulin  after surgery (instead of oral diabetes medicines) to make sure you have good blood sugar levels. The goal for blood sugar control after surgery is 80-180 mg/dL.   WHAT DO I DO ABOUT MY DIABETES MEDICATION?  Do not take oral diabetes medicines (pills) the morning of surgery.  THE DAY BEFORE SURGERY, take Metformin  as prescribed. Take Half (1/2) normal units of Lantus  insulin .      DO NOT TAKE THE FOLLOWING 7 DAYS PRIOR TO SURGERY: Ozempic  after March 10, 2024  Reviewed and Endorsed by [facility] Patient Education Committee, August 2015  [facility] - Preparing for Surgery Before surgery, you  can play an important role.  Because skin is not sterile, your skin needs to be as free of germs as possible.  You can reduce the number of germs on your skin by washing with CHG (chlorahexidine gluconate) soap before surgery.  CHG is an antiseptic cleaner which kills germs and bonds with the skin to continue killing germs even after washing. Please DO NOT use if you have an allergy to CHG or antibacterial soaps.  If your skin becomes reddened/irritated stop using the CHG and inform your nurse when you arrive at Short Stay. Do not shave (including legs and underarms) for at least 48 hours prior to the first CHG shower.  You may shave your face/neck.  Please follow these instructions carefully:  1.  Shower with CHG Soap the night before surgery and the  morning of surgery.  2.  If you choose to wash your hair, wash your hair first as usual with your normal  shampoo.  3.  After you shampoo, rinse your hair and body thoroughly to remove the shampoo.                             4.  Use CHG as you would any other liquid soap.  You can apply chg directly to the skin and wash.  Gently with a scrungie or clean washcloth.  5.  Apply the CHG Soap to your body ONLY FROM THE NECK DOWN.   Do   not use on face/ open                           Wound or open sores. Avoid contact with eyes, ears mouth and   genitals (private parts).                       Wash face,  Genitals (private parts) with your normal soap.             6.  Wash thoroughly, paying special attention to the area where your    surgery  will be performed.  7.  Thoroughly rinse your body with warm water from the neck down.  8.  DO NOT shower/wash with your normal soap after using and rinsing off the CHG Soap.                9.  Pat yourself dry with a clean towel.            10.  Wear clean pajamas.            11.  Place clean sheets on your bed the night of your first shower and do not  sleep with  pets. Day of Surgery : Do not apply any lotions/deodorants the morning of surgery.  Please wear clean clothes to the hospital/surgery center.  FAILURE TO FOLLOW THESE INSTRUCTIONS MAY RESULT IN THE CANCELLATION OF YOUR SURGERY  PATIENT SIGNATURE_________________________________  NURSE SIGNATURE__________________________________  ________________________________________________________________________

## 2024-03-07 ENCOUNTER — Other Ambulatory Visit: Payer: Self-pay

## 2024-03-07 ENCOUNTER — Encounter (HOSPITAL_COMMUNITY)
Admission: RE | Admit: 2024-03-07 | Discharge: 2024-03-07 | Disposition: A | Discharge status: Home / Self Care | Source: Ambulatory Visit | Attending: Urology | Admitting: Urology

## 2024-03-07 ENCOUNTER — Encounter (HOSPITAL_COMMUNITY): Payer: Self-pay

## 2024-03-07 VITALS — BP 148/87 | HR 93 | Temp 98.3°F | Resp 16 | Ht 65.0 in | Wt 324.0 lb

## 2024-03-07 DIAGNOSIS — Z01818 Encounter for other preprocedural examination: Secondary | ICD-10-CM | POA: Insufficient documentation

## 2024-03-07 DIAGNOSIS — E1140 Type 2 diabetes mellitus with diabetic neuropathy, unspecified: Secondary | ICD-10-CM | POA: Diagnosis not present

## 2024-03-07 DIAGNOSIS — Z794 Long term (current) use of insulin: Secondary | ICD-10-CM | POA: Insufficient documentation

## 2024-03-07 HISTORY — DX: Pneumonia, unspecified organism: J18.9

## 2024-03-07 HISTORY — DX: Personal history of urinary calculi: Z87.442

## 2024-03-07 LAB — BASIC METABOLIC PANEL WITH GFR
Anion gap: 10 (ref 5–15)
BUN: 13 mg/dL (ref 6–20)
CO2: 22 mmol/L (ref 22–32)
Calcium: 9.2 mg/dL (ref 8.9–10.3)
Chloride: 104 mmol/L (ref 98–111)
Creatinine, Ser: 0.85 mg/dL (ref 0.44–1.00)
GFR, Estimated: >60 mL/min (ref >60)
Glucose, Bld: 123 mg/dL — ABNORMAL HIGH (ref 70–99)
Potassium: 3.9 mmol/L (ref 3.5–5.1)
Sodium: 136 mmol/L (ref 135–145)

## 2024-03-07 LAB — HEMOGLOBIN A1C
Hgb A1c MFr Bld: 7.9 % — ABNORMAL HIGH (ref 4.8–5.6)
Mean Plasma Glucose: 180.03 mg/dL

## 2024-03-07 LAB — CBC
HCT: 44.1 % (ref 36.0–46.0)
Hemoglobin: 14 g/dL (ref 12.0–15.0)
MCH: 28.2 pg (ref 26.0–34.0)
MCHC: 31.7 g/dL (ref 30.0–36.0)
MCV: 88.7 fL (ref 80.0–100.0)
Platelets: 323 10*3/uL (ref 150–400)
RBC: 4.97 MIL/uL (ref 3.87–5.11)
RDW: 14.2 % (ref 11.5–15.5)
WBC: 19.2 10*3/uL — ABNORMAL HIGH (ref 4.0–10.5)
nRBC: 0 % (ref 0.0–0.2)

## 2024-03-07 LAB — GLUCOSE, CAPILLARY: Glucose-Capillary: 142 mg/dL — ABNORMAL HIGH (ref 70–99)

## 2024-03-07 NOTE — Progress Notes (Addendum)
 COVID Vaccine Completed:  Date of COVID positive in last 90 days:  PCP - Corean Comment, NP LOV 10/12/23 Cardiologist - n/a Endocrinologist- Elsie Sharps, MD  Chest x-ray - n/a EKG - 03/07/24 Epic Stress Test - yes, years ago per pt, normal per pt was anxiety ECHO - n/a Cardiac Cath - n/a Pacemaker/ICD device last checked: n/a Spinal Cord Stimulator: n/a  Bowel Prep - no  Sleep Study - yes, negative CPAP -   Fasting Blood Sugar -  Checks Blood Sugar not currently checking  Last dose of GLP1 agonist-  Ozempic   GLP1 instructions:  Do not take after  03/10/24   Last dose of SGLT-2 inhibitors-  N/A SGLT-2 instructions:  Do not take after     Blood Thinner Instructions:  Last dose: n/a  Time: Aspirin  Instructions: Last Dose:  Activity level: Can go up a flight of stairs and perform activities of daily living without stopping and without symptoms of chest pain or shortness of breath.   Anesthesia review: WBC 19.2, HTN, DM2  Patient denies shortness of breath, fever, cough and chest pain at PAT appointment  Patient verbalized understanding of instructions that were given to them at the PAT appointment. Patient was also instructed that they will need to review over the PAT instructions again at home before surgery.

## 2024-03-07 NOTE — Progress Notes (Signed)
 WBC 19.2, results rotued to Dr. Shane

## 2024-03-13 NOTE — H&P (Signed)
 63 year old female seen in the ER on 02/12/2024. She was seen for abdominal pain. Creatinine was 0.82 this is baseline for the patient WBC 12.8 UA showed no concerns for infection. CT shows left renal pelvis stone 1.3 cm in size. Can be seen on KUB.   PMH: DM2, obesity, GERD, no Cards hx, no Pulm Hx, no blood thinners  PSH: choleccystectomy.   L nephrolithiasis:  02/15/24: flomax , percocet No F/C. Some nausea. Pt is on ozempic  and has nausea with this. Still drinking and eating. Discussed options patient would like URS. No GH.  7/10 here today for URS   ALLERGIES: Doxycycline  Jardiance     MEDICATIONS: Crestor  10 MG Tablet  Lisinopril  5 MG Tablet  NexIUM 40 MG Capsule Delayed Release  Gabapentin   LaMICtal   Lantus   MetFORMIN  HCl - 1000 MG Oral Tablet Oral  Ozempic  (2 MG/DOSE)  Percocet 5-325 MG Oral Tablet Oral  Rexulti 0.5 MG Tablet  Trintellix 20 MG Tablet     GU PSH: D&C Non-OB - 20-Apr-2009       PSH Notes: Tonsillectomy, Cholecystectomy, Knee Arthroscopy, Dilation And Curettage   NON-GU PSH: Cholecystectomy (open) - 04-20-09 Remove Tonsils - April 20, 2009         GU PMH: LLQ pain, Abdominal pain, LLQ (left lower quadrant) - 04/20/2013 Personal Hx Oth female genital tract diseases, History of polycystic ovarian syndrome - 04-20-13      PMH Notes:  1898-09-08 00:00:00 - Note: Normal Routine History And Physical Adult  2009-03-06 08:52:40 - Note: Osteoarthritis  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   NON-GU PMH: Anxiety, Anxiety - 2014 Irritable bowel syndrome with diarrhea, Irritable Bowel Syndrome - 2014 Obesity, Morbid Obesity - 04/20/2013 Personal history of other diseases of the digestive system, History of diverticulitis of colon - 04-20-13 Personal history of other endocrine, nutritional and metabolic disease, History of diabetes mellitus - April 20, 2013 Personal history of other mental and behavioral disorders, History of depression - 04/20/2013    FAMILY HISTORY: Death In The Family Father - Runs  In Family Death In The Family Mother - Runs In Family liver cancer - Father Lung Cancer - Mother   SOCIAL HISTORY: No Social History     Notes: Occupation:, Alcohol Use, Caffeine Use, Marital History - Currently Married, Tobacco Use   REVIEW OF SYSTEMS:     GU Review Female:  Patient denies frequent urination, hard to postpone urination, burning /pain with urination, get up at night to urinate, leakage of urine, stream starts and stops, trouble starting your stream, have to strain to urinate, and being pregnant.    Gastrointestinal (Upper):  Patient reports nausea and indigestion/ heartburn. Patient denies vomiting.    Gastrointestinal (Lower):  Patient denies diarrhea and constipation.    Constitutional:  Patient reports fatigue. Patient denies fever, night sweats, and weight loss.    Skin:  Patient reports itching. Patient denies skin rash/ lesion.    Eyes:  Patient denies double vision and blurred vision.    Ears/ Nose/ Throat:  Patient denies sore throat and sinus problems.    Hematologic/Lymphatic:  Patient denies swollen glands and easy bruising.    Cardiovascular:  Patient denies leg swelling and chest pains.    Respiratory:  Patient denies cough and shortness of breath.    Endocrine:  Patient reports excessive thirst.     Musculoskeletal:  Patient reports back pain and joint pain.     Neurological:  Patient denies headaches and dizziness.    Psychologic:  Patient reports depression and  anxiety.     VITAL SIGNS:       02/15/2024 11:00 AM     BP 133/78 mmHg     Pulse 102 /min     Temperature 97.3 F / 36.2 C     MULTI-SYSTEM PHYSICAL EXAMINATION:      Constitutional: Well-nourished. No physical deformities. Normally developed. Good grooming.     Respiratory: No labored breathing, no use of accessory muscles.      Cardiovascular: Normal temperature, normal extremity pulses, no swelling, no varicosities.     Skin: No paleness, no jaundice, no cyanosis. No lesion, no ulcer, no rash.      Musculoskeletal: Normal gait and station of head and neck.            Complexity of Data:   Source Of History:  Patient  Records Review:  Previous Patient Records  Urine Test Review:  Urinalysis  X-Ray Review: C.T. Abdomen/Pelvis: Reviewed Films. Reviewed Report. Multiple nonobstructing kidney stones bilaterally. Left kidney with a renal pelvis stone 1.3 cm in size if been visualized on x-ray. Minimal to no hydronephrosis bilaterally no other bladder or kidney irregularities.    PROCEDURES:    Visit Complexity - G2211      Urinalysis  Dipstick Dipstick Cont'd  Color: Yellow Bilirubin: Neg mg/dL  Appearance: Clear Ketones: Neg mg/dL  Specific Gravity: 8.969 Blood: Neg ery/uL  pH: 5.5 Protein: Trace mg/dL  Glucose: Neg mg/dL Urobilinogen: 0.2 mg/dL   Nitrites: Neg   Leukocyte Esterase: Neg leu/uL    ASSESSMENT:     ICD-10 Details  1 GU:  Renal calculus - N20.0 Undiagnosed New Problem   PLAN:   Medications  New Meds: Methocarbamol  750 MG Tablet 2 tablet PO TID #120 0 Refill(s)  oxyCODONE  HCl 5 MG Tablet 1 tablet PO Q 6 H #10 0 Refill(s)     Pharmacy Name:  [id number]    Address:  [address]     [place], [place] [id number]    Phone:  [phone]    Fax:  [phone]   Orders  Labs Urine Culture  Document  Letter(s):  Created for Patient: Clinical Summary   Notes:  Left 1.3 cm stone: Patient having some symptoms from stone but stable. We discussed options for treatment including ESWL, ureteroscopy, and PCNL. Discussed the risk benefits alternatives to all them. Patient is concerned about ESWL due to risk of residual stone and needing to pass fragments. Patient has chosen URS  We discussed the risk benefits and alternatives to ureteroscopy. This includes bleeding, infection, damage to surrounding structures including the urethra, bladder, ureter, and kidney. With these possible injuries resulting in need for intervention in the future. We  discussed inability to remove all the stone and requiring follow-up ureteroscopy. We also discussed the possibility of not being able to gain access to the kidney and the need for nephrostomy tube. Possibility of long-term stent was also discussed. The patient voiced their understanding and would like to proceed.    Ucx negative plan to proceed with URS today

## 2024-03-14 ENCOUNTER — Encounter (HOSPITAL_COMMUNITY): Payer: Self-pay

## 2024-03-14 NOTE — Progress Notes (Addendum)
 Case: [id number] Date/Time: 03/17/24 1345   Procedures:      CYSTOSCOPY/URETEROSCOPY/HOLMIUM LASER/STENT PLACEMENT (Left)     CYSTOSCOPY, WITH RETROGRADE PYELOGRAM (Left)   Anesthesia type: General   Diagnosis: Kidney stone [N20.0]   Pre-op diagnosis: LEFT RENAL PELVIS STONE   Location: [facility] ROOM 03 / [facility]   Surgeons: Shane Steffan BROCKS, MD       DISCUSSION: Alicia Lucero is a 63 year old female who presents to PAT prior to surgery above.  Past medical history significant for former smoking, HTN, GERD, IDDM, bipolar disorder, anxiety, depression, arthritis.  Patient follows with endocrinology for history of IDDM.  Last seen in April 2025.  She is on insulin .  A1c is 7.9 on PAT labs.  Last seen by PCP on 10/12/2023.  She reported having GI issues with Ozempic  and was advised to decrease the dose.  Of note patient's WBC was 19.2 on PAT labs.  On review of prior values she has a chronic leukocytosis dating back to 2017 however is more mild in the 11-12 range.  Results were routed to her PCP and surgeon. Discussed with patient regarding results. She states she has been well. No infectious symptoms. Advised ok to proceed and to f/u with her PCP regarding chronic leukocytosis.  LD Ozempic : 7/3  VS: BP (!) 148/87   Pulse 93   Temp 36.8 C (Oral)   Resp 16   Ht 5' 5 (1.651 m)   Wt (!) 147 kg   LMP 03/28/2018   SpO2 97%   BMI 53.92 kg/m   PROVIDERS: Lucius Krabbe, NP   LABS: WBC 19. Results routed to PCP and surgeon. Left voicemail with patient to discuss on 7/7 (all labs ordered are listed, but only abnormal results are displayed)  Labs Reviewed  HEMOGLOBIN A1C - Abnormal; Notable for the following components:      Result Value   Hgb A1c MFr Bld 7.9 (*)    All other components within normal limits  BASIC METABOLIC PANEL WITH GFR - Abnormal; Notable for the following components:   Glucose, Bld 123 (*)    All other components within normal limits  CBC - Abnormal; Notable  for the following components:   WBC 19.2 (*)    All other components within normal limits  GLUCOSE, CAPILLARY - Abnormal; Notable for the following components:   Glucose-Capillary 142 (*)    All other components within normal limits     IMAGES:  CT Abdomen/Pelvis 02/12/24:  IMPRESSION: 1. A 13 mm left renal pelvis/UPJ stone with mild left hydronephrosis. 2. Sigmoid diverticulosis. No bowel obstruction. Normal appendix.    EKG 03/07/24:  Normal sinus rhythm, rate 95 Left axis deviation Low voltage QRS Inferior infarct , age undetermined Anterolateral infarct , age undetermined  CV:  Past Medical History:  Diagnosis Date   Allergy    Anxiety 1992   Arthritis 2007   Back pain    Benign essential HTN    Bipolar 1 disorder (HCC)    Cataract 2019   Chest pain    De Quervain's disease (radial styloid tenosynovitis) 07/08/2019   Depression    Diabetes mellitus without complication (HCC)    Diabetic peripheral neuropathy (HCC)    Drug use    Dyspnea    Fatty liver    Gallbladder problem    GERD (gastroesophageal reflux disease)    History of kidney stones    Hyperlipidemia    Infertility, female    Joint pain    Microalbuminuria 06/07/2021  Neuropathy    PCOS (polycystic ovarian syndrome)    PCOS (polycystic ovarian syndrome)    Perimenopausal    Pneumonia    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) 12/12/2021   Sciatica    Trigger finger of left thumb 07/08/2019   Type 2 diabetes mellitus with retinopathy, with long-term current use of insulin  (HCC) 06/01/2017    Past Surgical History:  Procedure Laterality Date   BREAST BIOPSY Right 03/13/2023   MM RT BREAST BX W LOC DEV 1ST LESION IMAGE BX SPEC STEREO GUIDE 03/13/2023 [facility]   CHOLECYSTECTOMY  09/08/1994   DILATION AND CURETTAGE OF UTERUS     secondary to menorrhagia   EYE SURGERY  2021   Cataract temoval   KNEE ARTHROSCOPY Left 09/08/2006   TONSILLECTOMY     as a child   WISDOM TOOTH  EXTRACTION      MEDICATIONS:  ACCU-CHEK AVIVA PLUS test strip   ACCU-CHEK SOFTCLIX LANCETS lancets   albuterol  (VENTOLIN  HFA) 108 (90 Base) MCG/ACT inhaler   Brexpiprazole 0.5 MG TABS   esomeprazole (NEXIUM) 40 MG capsule   gabapentin  (NEURONTIN ) 600 MG tablet   insulin  glargine (LANTUS ) 100 UNIT/ML Solostar Pen   Insulin  Pen Needle (B-D ULTRAFINE III SHORT PEN) 31G X 8 MM MISC   ketoconazole  (NIZORAL ) 2 % cream   lamoTRIgine  (LAMICTAL ) 200 MG tablet   lisdexamfetamine (VYVANSE) 70 MG capsule   lisinopril  (ZESTRIL ) 5 MG tablet   metFORMIN  (GLUCOPHAGE -XR) 500 MG 24 hr tablet   Multiple Vitamins-Minerals (WOMENS MULTIVITAMIN PLUS PO)   ondansetron  (ZOFRAN -ODT) 4 MG disintegrating tablet   oxyCODONE  (OXY IR/ROXICODONE ) 5 MG immediate release tablet   OZEMPIC , 2 MG/DOSE, 8 MG/3ML SOPN   rosuvastatin  (CRESTOR ) 20 MG tablet   tamsulosin  (FLOMAX ) 0.4 MG CAPS capsule   vortioxetine HBr (TRINTELLIX) 20 MG TABS tablet   No current facility-administered medications for this encounter.    Burnard CHRISTELLA Odis DEVONNA [facility] Short Stay/Anesthesiology [facility] Phone [phone] 03/14/2024 10:39 AM

## 2024-03-17 ENCOUNTER — Ambulatory Visit (HOSPITAL_COMMUNITY): Payer: Self-pay | Admitting: Physician Assistant

## 2024-03-17 ENCOUNTER — Ambulatory Visit (HOSPITAL_COMMUNITY)
Admission: RE | Admit: 2024-03-17 | Discharge: 2024-03-17 | Disposition: A | Discharge status: Home / Self Care | Source: Ambulatory Visit | Attending: Urology | Admitting: Urology

## 2024-03-17 ENCOUNTER — Other Ambulatory Visit: Payer: Self-pay

## 2024-03-17 ENCOUNTER — Encounter (HOSPITAL_COMMUNITY)
Admission: RE | Disposition: A | Discharge status: Home / Self Care | Payer: Self-pay | Source: Ambulatory Visit | Attending: Urology

## 2024-03-17 ENCOUNTER — Ambulatory Visit (HOSPITAL_COMMUNITY)

## 2024-03-17 ENCOUNTER — Encounter (HOSPITAL_COMMUNITY): Payer: Self-pay | Admitting: Urology

## 2024-03-17 DIAGNOSIS — Z87891 Personal history of nicotine dependence: Secondary | ICD-10-CM | POA: Insufficient documentation

## 2024-03-17 DIAGNOSIS — E282 Polycystic ovarian syndrome: Secondary | ICD-10-CM | POA: Diagnosis not present

## 2024-03-17 DIAGNOSIS — J4489 Other specified chronic obstructive pulmonary disease: Secondary | ICD-10-CM | POA: Diagnosis not present

## 2024-03-17 DIAGNOSIS — N202 Calculus of kidney with calculus of ureter: Secondary | ICD-10-CM | POA: Diagnosis not present

## 2024-03-17 DIAGNOSIS — E1140 Type 2 diabetes mellitus with diabetic neuropathy, unspecified: Secondary | ICD-10-CM

## 2024-03-17 DIAGNOSIS — J449 Chronic obstructive pulmonary disease, unspecified: Secondary | ICD-10-CM | POA: Diagnosis not present

## 2024-03-17 DIAGNOSIS — E119 Type 2 diabetes mellitus without complications: Secondary | ICD-10-CM | POA: Insufficient documentation

## 2024-03-17 DIAGNOSIS — I10 Essential (primary) hypertension: Secondary | ICD-10-CM | POA: Diagnosis not present

## 2024-03-17 DIAGNOSIS — F419 Anxiety disorder, unspecified: Secondary | ICD-10-CM | POA: Insufficient documentation

## 2024-03-17 DIAGNOSIS — K219 Gastro-esophageal reflux disease without esophagitis: Secondary | ICD-10-CM | POA: Insufficient documentation

## 2024-03-17 DIAGNOSIS — N201 Calculus of ureter: Secondary | ICD-10-CM

## 2024-03-17 DIAGNOSIS — F319 Bipolar disorder, unspecified: Secondary | ICD-10-CM | POA: Insufficient documentation

## 2024-03-17 DIAGNOSIS — Z79899 Other long term (current) drug therapy: Secondary | ICD-10-CM | POA: Insufficient documentation

## 2024-03-17 DIAGNOSIS — Z7985 Long-term (current) use of injectable non-insulin antidiabetic drugs: Secondary | ICD-10-CM | POA: Insufficient documentation

## 2024-03-17 DIAGNOSIS — Z794 Long term (current) use of insulin: Secondary | ICD-10-CM | POA: Insufficient documentation

## 2024-03-17 DIAGNOSIS — N200 Calculus of kidney: Secondary | ICD-10-CM | POA: Diagnosis present

## 2024-03-17 DIAGNOSIS — Z9049 Acquired absence of other specified parts of digestive tract: Secondary | ICD-10-CM | POA: Diagnosis not present

## 2024-03-17 HISTORY — PX: CYSTOSCOPY/URETEROSCOPY/HOLMIUM LASER/STENT PLACEMENT: SHX6546

## 2024-03-17 HISTORY — PX: CYSTOSCOPY W/ RETROGRADES: SHX1426

## 2024-03-17 LAB — GLUCOSE, CAPILLARY
Glucose-Capillary: 165 mg/dL — ABNORMAL HIGH (ref 70–99)
Glucose-Capillary: 82 mg/dL (ref 70–99)

## 2024-03-17 SURGERY — CYSTOSCOPY/URETEROSCOPY/HOLMIUM LASER/STENT PLACEMENT
Anesthesia: General | Laterality: Left

## 2024-03-17 MED ORDER — ONDANSETRON HCL 4 MG/2ML IJ SOLN
INTRAMUSCULAR | Status: AC
Start: 2024-03-17 — End: 2024-03-17
  Filled 2024-03-17: qty 2

## 2024-03-17 MED ORDER — OXYBUTYNIN CHLORIDE 5 MG PO TABS
5.0000 mg | ORAL_TABLET | Freq: Once | ORAL | Status: AC
  Administered 2024-03-17: 5 mg via ORAL

## 2024-03-17 MED ORDER — METHOCARBAMOL 500 MG PO TABS
500.0000 mg | ORAL_TABLET | Freq: Three times a day (TID) | ORAL | Status: DC
  Administered 2024-03-17: 500 mg via ORAL

## 2024-03-17 MED ORDER — IOHEXOL 300 MG/ML  SOLN
INTRAMUSCULAR | Status: DC | PRN
  Administered 2024-03-17: 50 mL via URETHRAL

## 2024-03-17 MED ORDER — FENTANYL CITRATE PF 50 MCG/ML IJ SOSY
PREFILLED_SYRINGE | INTRAMUSCULAR | Status: AC
Start: 2024-03-17 — End: 2024-03-17
  Filled 2024-03-17: qty 1

## 2024-03-17 MED ORDER — INSULIN ASPART 100 UNIT/ML IJ SOLN
0.0000 [IU] | INTRAMUSCULAR | Status: DC | PRN
  Administered 2024-03-17: 2 [IU] via SUBCUTANEOUS
  Filled 2024-03-17: qty 1

## 2024-03-17 MED ORDER — HYDROMORPHONE HCL 1 MG/ML IJ SOLN
INTRAMUSCULAR | Status: AC
  Filled 2024-03-17: qty 1

## 2024-03-17 MED ORDER — DROPERIDOL 2.5 MG/ML IJ SOLN
INTRAMUSCULAR | Status: AC
  Filled 2024-03-17: qty 2

## 2024-03-17 MED ORDER — ACETAMINOPHEN 10 MG/ML IV SOLN: 1000.0000 mg | Freq: Once | INTRAVENOUS | Status: DC | PRN

## 2024-03-17 MED ORDER — DROPERIDOL 2.5 MG/ML IJ SOLN: 0.6250 mg | Freq: Once | INTRAMUSCULAR | Status: DC | PRN

## 2024-03-17 MED ORDER — OXYCODONE HCL 5 MG/5ML PO SOLN: 5.0000 mg | Freq: Once | ORAL | Status: AC | PRN

## 2024-03-17 MED ORDER — SODIUM CHLORIDE 0.9 % IR SOLN
Status: DC | PRN
  Administered 2024-03-17: 1000 mL via INTRAVESICAL

## 2024-03-17 MED ORDER — TAMSULOSIN HCL 0.4 MG PO CAPS: 0.4000 mg | ORAL_CAPSULE | Freq: Every day | ORAL | 1 refills | Status: DC

## 2024-03-17 MED ORDER — OXYBUTYNIN CHLORIDE 5 MG PO TABS
ORAL_TABLET | ORAL | Status: AC
  Filled 2024-03-17: qty 1

## 2024-03-17 MED ORDER — ONDANSETRON HCL 4 MG/2ML IJ SOLN
INTRAMUSCULAR | Status: DC | PRN
Start: 2024-03-17 — End: 2024-03-17
  Administered 2024-03-17: 4 mg via INTRAVENOUS

## 2024-03-17 MED ORDER — MIDAZOLAM HCL 2 MG/2ML IJ SOLN
INTRAMUSCULAR | Status: AC
  Filled 2024-03-17: qty 2

## 2024-03-17 MED ORDER — ACETAMINOPHEN 500 MG PO TABS
1000.0000 mg | ORAL_TABLET | Freq: Once | ORAL | Status: AC
  Administered 2024-03-17: 1000 mg via ORAL
  Filled 2024-03-17: qty 2

## 2024-03-17 MED ORDER — DEXAMETHASONE SODIUM PHOSPHATE 10 MG/ML IJ SOLN
INTRAMUSCULAR | Status: AC
  Filled 2024-03-17: qty 1

## 2024-03-17 MED ORDER — GENTAMICIN SULFATE 40 MG/ML IJ SOLN
5.0000 mg/kg | INTRAVENOUS | Status: AC
  Administered 2024-03-17: 470 mg via INTRAVENOUS
  Filled 2024-03-17: qty 11.75

## 2024-03-17 MED ORDER — HYDROMORPHONE HCL 1 MG/ML IJ SOLN
0.5000 mg | INTRAMUSCULAR | Status: DC | PRN
  Administered 2024-03-17 (×2): 0.5 mg via INTRAVENOUS

## 2024-03-17 MED ORDER — FENTANYL CITRATE PF 50 MCG/ML IJ SOSY
PREFILLED_SYRINGE | INTRAMUSCULAR | Status: AC
  Filled 2024-03-17: qty 1

## 2024-03-17 MED ORDER — METHOCARBAMOL 500 MG PO TABS
ORAL_TABLET | ORAL | Status: AC
  Filled 2024-03-17: qty 1

## 2024-03-17 MED ORDER — PROPOFOL 10 MG/ML IV BOLUS
INTRAVENOUS | Status: DC | PRN
  Administered 2024-03-17: 200 mg via INTRAVENOUS
  Administered 2024-03-17: 50 mg via INTRAVENOUS

## 2024-03-17 MED ORDER — FENTANYL CITRATE (PF) 100 MCG/2ML IJ SOLN
INTRAMUSCULAR | Status: AC
  Filled 2024-03-17: qty 2

## 2024-03-17 MED ORDER — LIDOCAINE HCL (PF) 2 % IJ SOLN
INTRAMUSCULAR | Status: AC
  Filled 2024-03-17: qty 5

## 2024-03-17 MED ORDER — MIDAZOLAM HCL 2 MG/2ML IJ SOLN
INTRAMUSCULAR | Status: DC | PRN
  Administered 2024-03-17: 2 mg via INTRAVENOUS

## 2024-03-17 MED ORDER — PROPOFOL 10 MG/ML IV BOLUS
INTRAVENOUS | Status: AC
  Filled 2024-03-17: qty 20

## 2024-03-17 MED ORDER — DEXAMETHASONE SODIUM PHOSPHATE 10 MG/ML IJ SOLN
INTRAMUSCULAR | Status: DC | PRN
Start: 2024-03-17 — End: 2024-03-17
  Administered 2024-03-17: 10 mg via INTRAVENOUS

## 2024-03-17 MED ORDER — ORAL CARE MOUTH RINSE: 15.0000 mL | Freq: Once | OROMUCOSAL | Status: AC

## 2024-03-17 MED ORDER — LACTATED RINGERS IV SOLN: INTRAVENOUS | Status: DC

## 2024-03-17 MED ORDER — LIDOCAINE 2% (20 MG/ML) 5 ML SYRINGE
INTRAMUSCULAR | Status: DC | PRN
  Administered 2024-03-17: 100 mg via INTRAVENOUS

## 2024-03-17 MED ORDER — CHLORHEXIDINE GLUCONATE 0.12 % MT SOLN
15.0000 mL | Freq: Once | OROMUCOSAL | Status: AC
  Administered 2024-03-17: 15 mL via OROMUCOSAL

## 2024-03-17 MED ORDER — SODIUM CHLORIDE 0.9 % IR SOLN
Status: DC | PRN
  Administered 2024-03-17: 1000 mL

## 2024-03-17 MED ORDER — AMISULPRIDE (ANTIEMETIC) 5 MG/2ML IV SOLN
INTRAVENOUS | Status: AC
  Filled 2024-03-17: qty 2

## 2024-03-17 MED ORDER — OXYCODONE HCL 5 MG PO TABS
5.0000 mg | ORAL_TABLET | Freq: Once | ORAL | Status: AC | PRN
  Administered 2024-03-17: 5 mg via ORAL

## 2024-03-17 MED ORDER — FENTANYL CITRATE PF 50 MCG/ML IJ SOSY
25.0000 ug | PREFILLED_SYRINGE | INTRAMUSCULAR | Status: DC | PRN
  Administered 2024-03-17 (×3): 50 ug via INTRAVENOUS

## 2024-03-17 MED ORDER — METHOCARBAMOL 750 MG PO TABS: 750.0000 mg | ORAL_TABLET | Freq: Four times a day (QID) | ORAL | 0 refills | Status: AC

## 2024-03-17 MED ORDER — FENTANYL CITRATE (PF) 250 MCG/5ML IJ SOLN
INTRAMUSCULAR | Status: DC | PRN
  Administered 2024-03-17 (×2): 50 ug via INTRAVENOUS

## 2024-03-17 MED ORDER — OXYCODONE HCL 5 MG PO TABS
ORAL_TABLET | ORAL | Status: AC
  Filled 2024-03-17: qty 1

## 2024-03-17 MED ORDER — PHENAZOPYRIDINE HCL 200 MG PO TABS: 200.0000 mg | ORAL_TABLET | Freq: Three times a day (TID) | ORAL | 0 refills | Status: DC | PRN

## 2024-03-17 MED ORDER — HYOSCYAMINE SULFATE 0.125 MG PO TBDP: 0.1250 mg | ORAL_TABLET | Freq: Four times a day (QID) | ORAL | 0 refills | Status: DC | PRN

## 2024-03-17 SURGICAL SUPPLY — 27 items
BAG COUNTER SPONGE SURGICOUNT (BAG) IMPLANT
BAG URO CATCHER STRL LF (MISCELLANEOUS) ×1 IMPLANT
BASKET ZERO TIP NITINOL 2.4FR (BASKET) IMPLANT
CATH URETERAL DUAL LUMEN 10F (MISCELLANEOUS) IMPLANT
CATH URETL OPEN 5X70 (CATHETERS) IMPLANT
CLOTH BEACON ORANGE TIMEOUT ST (SAFETY) ×1 IMPLANT
EXTRACTOR STONE 1.7FRX115CM (UROLOGICAL SUPPLIES) IMPLANT
FIBER LASER MOSES 200 DFL (Laser) IMPLANT
FIBER LASER MOSES 365 DFL (Laser) IMPLANT
GLOVE BIO SURGEON STRL SZ8 (GLOVE) ×1 IMPLANT
GOWN STRL REUS W/ TWL XL LVL3 (GOWN DISPOSABLE) ×1 IMPLANT
GUIDEWIRE ANG ZIPWIRE 038X150 (WIRE) IMPLANT
GUIDEWIRE STR DUAL SENSOR (WIRE) ×1 IMPLANT
KIT TURNOVER KIT A (KITS) ×1 IMPLANT
LASER FIB FLEXIVA PULSE ID 365 (Laser) IMPLANT
LASER FIB FLEXIVA PULSE ID 550 (Laser) IMPLANT
LASER FIB FLEXIVA PULSE ID 910 (Laser) IMPLANT
MANIFOLD NEPTUNE II (INSTRUMENTS) ×1 IMPLANT
MAT HALF PREVALON HALF STRYKER (MISCELLANEOUS) IMPLANT
NS IRRIG 1000ML POUR BTL (IV SOLUTION) IMPLANT
PACK CYSTO (CUSTOM PROCEDURE TRAY) ×1 IMPLANT
SHEATH NAVIGATOR HD 11/13X28 (SHEATH) IMPLANT
SHEATH NAVIGATOR HD 11/13X36 (SHEATH) ×1 IMPLANT
TOWEL OR 17X24 6PK STRL BLUE (TOWEL DISPOSABLE) IMPLANT
TRACTIP FLEXIVA PULS ID 200XHI (Laser) IMPLANT
TUBING CONNECTING 10 (TUBING) ×1 IMPLANT
TUBING UROLOGY SET (TUBING) ×1 IMPLANT

## 2024-03-17 NOTE — Anesthesia Preprocedure Evaluation (Addendum)
 Anesthesia Evaluation  Patient identified by MRN, date of birth, ID band Patient awake    Reviewed: Allergy & Precautions, H&P , NPO status , Patient's Chart, lab work & pertinent test results  History of Anesthesia Complications Negative for: history of anesthetic complications  Airway Mallampati: I  TM Distance: >3 FB Neck ROM: Full    Dental  (+) Dental Advisory Given   Pulmonary asthma , COPD, former smoker   breath sounds clear to auscultation       Cardiovascular hypertension, Pt. on medications (-) angina  Rhythm:Regular Rate:Normal     Neuro/Psych  PSYCHIATRIC DISORDERS Anxiety Depression Bipolar Disorder   negative neurological ROS     GI/Hepatic Neg liver ROS,GERD  Medicated and Controlled,,  Endo/Other  diabetes (glu 165), Type 2, Insulin  Dependent, Oral Hypoglycemic Agents  Class 4 obesityBMI 54 Ozempic  PCOS  Renal/GU LEFT RENAL PELVIS STONE  negative genitourinary   Musculoskeletal  (+) Arthritis ,    Abdominal   Peds negative pediatric ROS (+)  Hematology negative hematology ROS (+)   Anesthesia Other Findings   Reproductive/Obstetrics negative OB ROS                              Anesthesia Physical Anesthesia Plan  ASA: 3  Anesthesia Plan: General   Post-op Pain Management: Tylenol  PO (pre-op)*   Induction: Intravenous  PONV Risk Score and Plan: 3 and Ondansetron , Dexamethasone , Midazolam  and Treatment may vary due to age or medical condition  Airway Management Planned: LMA  Additional Equipment: None  Intra-op Plan:   Post-operative Plan:   Informed Consent: I have reviewed the patients History and Physical, chart, labs and discussed the procedure including the risks, benefits and alternatives for the proposed anesthesia with the patient or authorized representative who has indicated his/her understanding and acceptance.     Dental advisory  given  Plan Discussed with: CRNA  Anesthesia Plan Comments:          Anesthesia Quick Evaluation

## 2024-03-17 NOTE — Anesthesia Procedure Notes (Signed)
 Procedure Name: LMA Insertion Date/Time: 03/17/2024 3:31 PM  Performed by: Nanci Riis, CRNAPre-anesthesia Checklist: Patient identified, Emergency Drugs available, Suction available, Patient being monitored and Timeout performed Patient Re-evaluated:Patient Re-evaluated prior to induction Oxygen  Delivery Method: Circle system utilized Preoxygenation: Pre-oxygenation with 100% oxygen  Induction Type: IV induction LMA: LMA with gastric port inserted LMA Size: 3.0 Number of attempts: 1 Placement Confirmation: breath sounds checked- equal and bilateral and positive ETCO2 Tube secured with: Tape Dental Injury: Teeth and Oropharynx as per pre-operative assessment

## 2024-03-17 NOTE — Discharge Instructions (Addendum)
 DISCHARGE INSTRUCTIONS FOR Ureteroscopy and/or Ureteral Stent Placement  MEDICATIONS:  1.  Robaxin  2. Tamsulosin   3. Hyoscyamine   4. Methocarbamol    ACTIVITY:  1. No strenuous activity x 1week  2. No driving while on narcotic pain medications  3. Drink plenty of water  4. Continue to walk at home - it is normal to see blood in the urine while the stent is in place, so keep active, but don't over do it.  5. May return to work/school tomorrow or when you feel ready  6. You may experience some pain when urinating in the kidney on the side that was operated on while the stent is in place this is normal  WHAT IS NORMAL TO EXPERIENCE: It is normal to feel the urge to urinate while the stent is in place It is normal to have blood in your urine while the stent is in place  It sometimes can be normal to have pain in your kidney when you urinate   BATHING:  1. You can shower and we recommend daily showers    DIET: You may return to your normal diet immediately. Because of the raw surface of your bladder, alcohol, spicy foods, acid type foods and drinks with caffeine may cause irritation or frequency and should be used in moderation. To keep your urine flowing freely and to avoid constipation, drink plenty of fluids during the day ( 8-10 glasses ). Tip: Avoid cranberry juice because it is very acidic.  SIGNS/SYMPTOMS TO CALL:  Please call us  if you have a fever greater than 101.5, uncontrolled nausea/vomiting, uncontrolled pain, dizziness, unable to urinate, bloody urine with clots greater than the size of a quarter, chest pain, shortness of breath, leg swelling, leg pain, redness around wound, drainage from wound, or any other concerns or questions.   You can reach us  at [phone].   FOLLOW-UP:  1. You will be called to set up follow up ureteroscopy in very soon

## 2024-03-17 NOTE — Anesthesia Postprocedure Evaluation (Signed)
 Anesthesia Post Note  Patient: Alicia Lucero  Procedure(s) Performed: CYSTOSCOPY/URETEROSCOPY/STENT PLACEMENT (Left) CYSTOSCOPY, WITH RETROGRADE PYELOGRAM (Left)     Patient location during evaluation: PACU Anesthesia Type: General Level of consciousness: awake and alert Pain management: pain level controlled Vital Signs Assessment: post-procedure vital signs reviewed and stable Respiratory status: spontaneous breathing, nonlabored ventilation, respiratory function stable and patient connected to nasal cannula oxygen  Cardiovascular status: blood pressure returned to baseline and stable Postop Assessment: no apparent nausea or vomiting Anesthetic complications: no   No notable events documented.  Last Vitals:  Vitals:   03/17/24 1658 03/17/24 1700  BP: (!) 156/71 (!) 156/71  Pulse: 73 74  Resp: 12 12  Temp:    SpO2: 94% 96%    Last Pain:  Vitals:   03/17/24 1736  TempSrc:   PainSc: 8                  Alicia Lucero

## 2024-03-17 NOTE — Op Note (Signed)
 Preoperative diagnosis: left ureteral calculus  Postoperative diagnosis: left ureteral calculus  Procedure:  Cystoscopy Left retrograde pyleogram  Left 6x24 JJ ureteral stent   Surgeon: Steffan Pea MD.  Anesthesia: General  Complications: None  Intraoperative findings:  No tumors or abnormalities in bladder There is a left ureteral orifice unable to pass scope or dilate.  EBL: Minimal  Specimens: None  Disposition of specimens: [facility] for stone analysis  Indication: Alicia Lucero is a 63 y.o.   patient with a left renal pelvis stone and associated symptoms she is here today for left uteroscopy with laser lithotripsy.  After reviewing the management options for treatment, the patient elected to proceed with the above surgical procedure(s). We have discussed the potential benefits and risks of the procedure, side effects of the proposed treatment, the likelihood of the patient achieving the goals of the procedure, and any potential problems that might occur during the procedure or recuperation. Informed consent has been obtained.   Description of procedure:  The patient was taken to the operating room and general anesthesia was induced.  The patient was placed in the dorsal lithotomy position, prepped and draped in the usual sterile fashion, and preoperative antibiotics were administered. A preoperative time-out was performed.   Cystourethroscopy was performed.  The patient's urethra was examined and was normal. Pan cystoscopy was then performed. There was no evidence for any bladder tumors, stones, or other mucosal pathology.    Attention then turned to the left ureteral orifice and a ureteral catheter was used to intubate the ureteral orifice.  Omnipaque  contrast was injected through the ureteral catheter and a retrograde pyelogram was performed with findings as dictated above.  A 0.38 sensor guidewire was then advanced up the left ureter into the  renal pelvis under fluoroscopic guidance. A dual lumen catheter was then placed over the sensor wire the dual-lumen would not advance into the ureter.  The dual-lumen catheter was then removed and the obturator for a 11-13 French ureteral access sheath was attempted to advance in the left ureter it was unable to advance past the ureteral orifice.  This was in the removed leaving the wire in place.  Then a single-lumen flexible ureteroscope was intended to advance into the ureter over the wire this again would advance past the left ureteral orifice.  The safety wire was then back loaded through the 93fr cystoscope. The cystoscope was then driven into the bladder and a 6x35fr without strings was placed without complication. Proper placement was confirmed with flouro.   The bladder was then emptied and the procedure ended.  The patient appeared to tolerate the procedure well and without complications.  The patient was able to be awakened and transferred to the recovery unit in satisfactory condition.   Disposition: Patient will be called to set up follow-up ureteroscopy within the next 2 to 3 weeks.

## 2024-03-17 NOTE — Transfer of Care (Signed)
 Immediate Anesthesia Transfer of Care Note  Patient: Alicia Lucero  Procedure(s) Performed: CYSTOSCOPY/URETEROSCOPY/HOLMIUM LASER/STENT PLACEMENT (Left) CYSTOSCOPY, WITH RETROGRADE PYELOGRAM (Left)  Patient Location: PACU  Anesthesia Type:General  Level of Consciousness: awake, alert , oriented, and patient cooperative  Airway & Oxygen  Therapy: Patient Spontanous Breathing and Patient connected to face mask oxygen   Post-op Assessment: Report given to RN and Post -op Vital signs reviewed and stable  Post vital signs: Reviewed and stable  Last Vitals:  Vitals Value Taken Time  BP 157/81 03/17/24 16:08  Temp 97   Pulse 80 03/17/24 16:11  Resp 13 03/17/24 16:11  SpO2 100 % 03/17/24 16:11  Vitals shown include unfiled device data.  Last Pain:  Vitals:   03/17/24 1220  TempSrc: Oral  PainSc:          Complications: No notable events documented.

## 2024-03-18 ENCOUNTER — Encounter (HOSPITAL_COMMUNITY): Payer: Self-pay | Admitting: Urology

## 2024-03-21 ENCOUNTER — Other Ambulatory Visit: Payer: Self-pay | Admitting: Urology

## 2024-04-08 NOTE — Patient Instructions (Addendum)
 SURGICAL WAITING ROOM VISITATION  Patients having surgery or a procedure may have no more than 2 support people in the waiting area - these visitors may rotate.    Children under the age of 45 must have an adult with them who is not the patient.  Visitors with respiratory illnesses are discouraged from visiting and should remain at home.  If the patient needs to stay at the hospital during part of their recovery, the visitor guidelines for inpatient rooms apply. Pre-op nurse will coordinate an appropriate time for 1 support person to accompany patient in pre-op.  This support person may not rotate.    Please refer to the [facility] website for the visitor guidelines for Inpatients (after your surgery is over and you are in a regular room).    Your procedure is scheduled on: 04/19/24   Report to [facility] Main Entrance    Report to admitting at 9:45 AM   Call this number if you have problems the morning of surgery [phone]   Do not eat food or drink liquids :After Midnight.          If you have questions, please contact your surgeon's office.   FOLLOW BOWEL PREP AND ANY ADDITIONAL PRE OP INSTRUCTIONS YOU RECEIVED FROM YOUR SURGEON'S OFFICE!!!     Oral Hygiene is also important to reduce your risk of infection.                                    Remember - BRUSH YOUR TEETH THE MORNING OF SURGERY WITH YOUR REGULAR TOOTHPASTE  DENTURES WILL BE REMOVED PRIOR TO SURGERY PLEASE DO NOT APPLY Poly grip OR ADHESIVES!!!   Stop all vitamins and herbal supplements 7 days before surgery.   Take these medicines the morning of surgery with A SIP OF WATER: Inhalers, Nexium, Gabapentin , Zofran , Pyridum, Rosuvastatin , Trintellix, Brexpiprazole   DO NOT TAKE ANY ORAL DIABETIC MEDICATIONS DAY OF YOUR SURGERY  How to Manage Your Diabetes Before and After Surgery  Why is it important to control my blood sugar before and after surgery? Improving blood sugar levels before and after  surgery helps healing and can limit problems. A way of improving blood sugar control is eating a healthy diet by:  Eating less sugar and carbohydrates  Increasing activity/exercise  Talking with your doctor about reaching your blood sugar goals High blood sugars (greater than 180 mg/dL) can raise your risk of infections and slow your recovery, so you will need to focus on controlling your diabetes during the weeks before surgery. Make sure that the doctor who takes care of your diabetes knows about your planned surgery including the date and location.  How do I manage my blood sugar before surgery? Check your blood sugar at least 4 times a day, starting 2 days before surgery, to make sure that the level is not too high or low. Check your blood sugar the morning of your surgery when you wake up and every 2 hours until you get to the Short Stay unit. If your blood sugar is less than 70 mg/dL, you will need to treat for low blood sugar: Do not take insulin . Treat a low blood sugar (less than 70 mg/dL) with  cup of clear juice (cranberry or apple), 4 glucose tablets, OR glucose gel. Recheck blood sugar in 15 minutes after treatment (to make sure it is greater than 70 mg/dL). If your blood  sugar is not greater than 70 mg/dL on recheck, call [phone] for further instructions. Report your blood sugar to the short stay nurse when you get to Short Stay.  If you are admitted to the hospital after surgery: Your blood sugar will be checked by the staff and you will probably be given insulin  after surgery (instead of oral diabetes medicines) to make sure you have good blood sugar levels. The goal for blood sugar control after surgery is 80-180 mg/dL.   WHAT DO I DO ABOUT MY DIABETES MEDICATION?  Do not take oral diabetes medicines (pills) the morning of surgery.  Hold Ozempic  for 7 days. Do not take after 04/11/24.  THE DAY BEFORE SURGERY, take Metformin  as prescribed. Take 50% of Lantus  at  night.     THE MORNING OF SURGERY, do not take Metformin  or Lantus .  DO NOT TAKE THE FOLLOWING 7 DAYS PRIOR TO SURGERY: Ozempic , Wegovy , Rybelsus  (Semaglutide ), Byetta (exenatide), Bydureon (exenatide ER), Victoza , Saxenda  (liraglutide ), or Trulicity (dulaglutide) Mounjaro (Tirzepatide) Adlyxin (Lixisenatide), Polyethylene Glycol Loxenatide.  Reviewed and Endorsed by [facility] Patient Education Committee, August 2015                              You may not have any metal on your body including hair pins, jewelry, and body piercing             Do not wear make-up, lotions, powders, perfumes, or deodorant  Do not wear nail polish including gel and S&S, artificial/acrylic nails, or any other type of covering on natural nails including finger and toenails. If you have artificial nails, gel coating, etc. that needs to be removed by a nail salon please have this removed prior to surgery or surgery may need to be canceled/ delayed if the surgeon/ anesthesia feels like they are unable to be safely monitored.   Do not shave  48 hours prior to surgery.    Do not bring valuables to the hospital. [facility] IS NOT             RESPONSIBLE   FOR VALUABLES.   Contacts, glasses, dentures or bridgework may not be worn into surgery.  DO NOT BRING YOUR HOME MEDICATIONS TO THE HOSPITAL. PHARMACY WILL DISPENSE MEDICATIONS LISTED ON YOUR MEDICATION LIST TO YOU DURING YOUR ADMISSION IN THE HOSPITAL!    Patients discharged on the day of surgery will not be allowed to drive home.  Someone NEEDS to stay with you for the first 24 hours after anesthesia              Please read over the following fact sheets you were given: IF YOU HAVE QUESTIONS ABOUT YOUR PRE-OP INSTRUCTIONS PLEASE CALL [phone] [place].   If you received a COVID test during your pre-op visit  it is requested that you wear a mask when out in public, stay away from anyone that may not be feeling well and notify your surgeon if you develop  symptoms. If you test positive for Covid or have been in contact with anyone that has tested positive in the last 10 days please notify you surgeon.    [place] - Preparing for Surgery Before surgery, you can play an important role.  Because skin is not sterile, your skin needs to be as free of germs as possible.  You can reduce the number of germs on your skin by washing with CHG (chlorahexidine gluconate) soap before surgery.  CHG  is an antiseptic cleaner which kills germs and bonds with the skin to continue killing germs even after washing. Please DO NOT use if you have an allergy to CHG or antibacterial soaps.  If your skin becomes reddened/irritated stop using the CHG and inform your nurse when you arrive at Short Stay. Do not shave (including legs and underarms) for at least 48 hours prior to the first CHG shower.  You may shave your face/neck.  Please follow these instructions carefully:  1.  Shower with CHG Soap the night before surgery and the  morning of surgery.  2.  If you choose to wash your hair, wash your hair first as usual with your normal  shampoo.  3.  After you shampoo, rinse your hair and body thoroughly to remove the shampoo.                             4.  Use CHG as you would any other liquid soap.  You can apply chg directly to the skin and wash.  Gently with a scrungie or clean washcloth.  5.  Apply the CHG Soap to your body ONLY FROM THE NECK DOWN.   Do   not use on face/ open                           Wound or open sores. Avoid contact with eyes, ears mouth and   genitals (private parts).                       Wash face,  Genitals (private parts) with your normal soap.             6.  Wash thoroughly, paying special attention to the area where your    surgery  will be performed.  7.  Thoroughly rinse your body with warm water from the neck down.  8.  DO NOT shower/wash with your normal soap after using and rinsing off the CHG Soap.                9.  Pat yourself dry  with a clean towel.            10.  Wear clean pajamas.            11.  Place clean sheets on your bed the night of your first shower and do not  sleep with pets. Day of Surgery : Do not apply any lotions/deodorants the morning of surgery.  Please wear clean clothes to the hospital/surgery center.  FAILURE TO FOLLOW THESE INSTRUCTIONS MAY RESULT IN THE CANCELLATION OF YOUR SURGERY  PATIENT SIGNATURE_________________________________  NURSE SIGNATURE__________________________________  ________________________________________________________________________

## 2024-04-08 NOTE — Progress Notes (Addendum)
 COVID Vaccine Completed: yes  Date of COVID positive in last 90 days:  PCP - Corean Sprawls, NP Cardiologist - n/a Endocrinologist- Elsie Sharps, MD  Chest x-ray - n/a EKG - 03/07/24 Epic Stress Test - yes 30 years ago per pt ECHO - n/a Cardiac Cath - n/a Pacemaker/ICD device last checked: n/a Spinal Cord Stimulator: n/a  Bowel Prep - no  Sleep Study - n/a CPAP -   Fasting Blood Sugar - no BS checks at home Checks Blood Sugar   Last dose of GLP1 agonist-  Ozempic , takes on Mondyas GLP1 instructions:  Do not take after  04/11/24. Patients reports last dose was 04/05/24   Last dose of SGLT-2 inhibitors-  N/A SGLT-2 instructions:  Do not take after     Blood Thinner Instructions:  Last dose: n/a  Time: Aspirin  Instructions: Last Dose:  Activity level: Can go up a flight of stairs and perform activities of daily living without stopping and without symptoms of chest pain or shortness of breath.  Anesthesia review: HTN, DM2, Fatty liver, dyspnea,   Patient denies shortness of breath, fever, cough and chest pain at PAT appointment  Patient verbalized understanding of instructions that were given to them at the PAT appointment. Patient was also instructed that they will need to review over the PAT instructions again at home before surgery.

## 2024-04-12 ENCOUNTER — Other Ambulatory Visit: Payer: Self-pay

## 2024-04-12 ENCOUNTER — Encounter (HOSPITAL_COMMUNITY)
Admission: RE | Admit: 2024-04-12 | Discharge: 2024-04-12 | Disposition: A | Discharge status: Home / Self Care | Source: Ambulatory Visit | Attending: Urology | Admitting: Urology

## 2024-04-12 ENCOUNTER — Encounter (HOSPITAL_COMMUNITY): Payer: Self-pay

## 2024-04-12 VITALS — BP 153/83 | HR 92 | Temp 99.0°F | Resp 18 | Ht 65.0 in | Wt 323.0 lb

## 2024-04-12 DIAGNOSIS — Z794 Long term (current) use of insulin: Secondary | ICD-10-CM | POA: Insufficient documentation

## 2024-04-12 DIAGNOSIS — Z01812 Encounter for preprocedural laboratory examination: Secondary | ICD-10-CM | POA: Insufficient documentation

## 2024-04-12 DIAGNOSIS — E1140 Type 2 diabetes mellitus with diabetic neuropathy, unspecified: Secondary | ICD-10-CM | POA: Diagnosis not present

## 2024-04-12 LAB — GLUCOSE, CAPILLARY: Glucose-Capillary: 142 mg/dL — ABNORMAL HIGH (ref 70–99)

## 2024-04-12 LAB — BASIC METABOLIC PANEL WITH GFR
Anion gap: 12 (ref 5–15)
BUN: 16 mg/dL (ref 6–20)
CO2: 23 mmol/L (ref 22–32)
Calcium: 9.1 mg/dL (ref 8.9–10.3)
Chloride: 103 mmol/L (ref 98–111)
Creatinine, Ser: 0.81 mg/dL (ref 0.44–1.00)
GFR, Estimated: >60 mL/min (ref >60)
Glucose, Bld: 126 mg/dL — ABNORMAL HIGH (ref 70–99)
Potassium: 4.2 mmol/L (ref 3.5–5.1)
Sodium: 138 mmol/L (ref 135–145)

## 2024-04-12 LAB — CBC
HCT: 40.5 % (ref 36.0–46.0)
Hemoglobin: 12.9 g/dL (ref 12.0–15.0)
MCH: 28.4 pg (ref 26.0–34.0)
MCHC: 31.9 g/dL (ref 30.0–36.0)
MCV: 89 fL (ref 80.0–100.0)
Platelets: 439 K/uL — ABNORMAL HIGH (ref 150–400)
RBC: 4.55 MIL/uL (ref 3.87–5.11)
RDW: 14.5 % (ref 11.5–15.5)
WBC: 14.4 K/uL — ABNORMAL HIGH (ref 4.0–10.5)
nRBC: 0 % (ref 0.0–0.2)

## 2024-04-13 NOTE — Anesthesia Preprocedure Evaluation (Addendum)
 Anesthesia Evaluation  Patient identified by MRN, date of birth, ID band Patient awake    Reviewed: Allergy & Precautions, NPO status , Patient's Chart, lab work & pertinent test results  Airway Mallampati: III  TM Distance: >3 FB Neck ROM: Full    Dental  (+) Dental Advisory Given   Pulmonary asthma , former smoker   breath sounds clear to auscultation       Cardiovascular hypertension, Pt. on medications  Rhythm:Regular Rate:Normal     Neuro/Psych  Neuromuscular disease    GI/Hepatic Neg liver ROS,GERD  Medicated and Controlled,,  Endo/Other  diabetes, Type 2, Insulin  Dependent  Class 4 obesity  Renal/GU negative Renal ROS     Musculoskeletal  (+) Arthritis ,    Abdominal   Peds  Hematology negative hematology ROS (+)   Anesthesia Other Findings   Reproductive/Obstetrics                              Anesthesia Physical Anesthesia Plan  ASA: 3  Anesthesia Plan: General   Post-op Pain Management: Ofirmev  IV (intra-op)*   Induction: Intravenous  PONV Risk Score and Plan: 3 and Ondansetron , Dexamethasone  and Treatment may vary due to age or medical condition  Airway Management Planned: LMA  Additional Equipment:   Intra-op Plan:   Post-operative Plan: Extubation in OR  Informed Consent: I have reviewed the patients History and Physical, chart, labs and discussed the procedure including the risks, benefits and alternatives for the proposed anesthesia with the patient or authorized representative who has indicated his/her understanding and acceptance.     Dental advisory given  Plan Discussed with: CRNA  Anesthesia Plan Comments:          Anesthesia Quick Evaluation

## 2024-04-17 NOTE — H&P (Signed)
 63 year old female seen in the ER on 02/12/2024. She was seen for abdominal pain. Creatinine was 0.82 this is baseline for the patient WBC 12.8 UA showed no concerns for infection. CT shows left renal pelvis stone 1.3 cm in size. Can be seen on KUB.  Attempted URS on 7/10 but unable to complete because narrow calibre ureter   PMH: DM2, obesity, GERD, no Cards hx, no Pulm Hx, no blood thinners  PSH: choleccystectomy.   L nephrolithiasis:  02/15/24: flomax , percocet No F/C. Some nausea. Pt is on ozempic  and has nausea with this. Still drinking and eating. Discussed options patient would like URS. No GH.  7/10 attempted URS but ureter would not accommodate ureteroscope, stented 8/12: URS planned today    ALLERGIES: Doxycycline  Jardiance     MEDICATIONS: Crestor  10 MG Tablet  Lisinopril  5 MG Tablet  NexIUM 40 MG Capsule Delayed Release  Gabapentin   LaMICtal   Lantus   MetFORMIN  HCl - 1000 MG Oral Tablet Oral  Ozempic  (2 MG/DOSE)  Percocet 5-325 MG Oral Tablet Oral  Rexulti 0.5 MG Tablet  Trintellix 20 MG Tablet     GU PSH: D&C Non-OB - 28-Apr-2009       PSH Notes: Tonsillectomy, Cholecystectomy, Knee Arthroscopy, Dilation And Curettage   NON-GU PSH: Cholecystectomy (open) - 04-28-2009 Remove Tonsils - 2009-04-28         GU PMH: LLQ pain, Abdominal pain, LLQ (left lower quadrant) - Apr 28, 2013 Personal Hx Oth female genital tract diseases, History of polycystic ovarian syndrome - Apr 28, 2013      PMH Notes:  1898-09-08 00:00:00 - Note: Normal Routine History And Physical Adult  2009-03-06 08:52:40 - Note: Osteoarthritis  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   NON-GU PMH: Anxiety, Anxiety - 2014 Irritable bowel syndrome with diarrhea, Irritable Bowel Syndrome - 2014 Obesity, Morbid Obesity - 28-Apr-2013 Personal history of other diseases of the digestive system, History of diverticulitis of colon - 04/28/2013 Personal history of other endocrine, nutritional and metabolic disease, History of diabetes mellitus  - 04-28-13 Personal history of other mental and behavioral disorders, History of depression - 04-28-13    FAMILY HISTORY: Death In The Family Father - Runs In Family Death In The Family Mother - Runs In Family liver cancer - Father Lung Cancer - Mother   SOCIAL HISTORY: No Social History     Notes: Occupation:, Alcohol Use, Caffeine Use, Marital History - Currently Married, Tobacco Use   REVIEW OF SYSTEMS:     GU Review Female:  Patient denies frequent urination, hard to postpone urination, burning /pain with urination, get up at night to urinate, leakage of urine, stream starts and stops, trouble starting your stream, have to strain to urinate, and being pregnant.    Gastrointestinal (Upper):  Patient reports nausea and indigestion/ heartburn. Patient denies vomiting.    Gastrointestinal (Lower):  Patient denies diarrhea and constipation.    Constitutional:  Patient reports fatigue. Patient denies fever, night sweats, and weight loss.    Skin:  Patient reports itching. Patient denies skin rash/ lesion.    Eyes:  Patient denies double vision and blurred vision.    Ears/ Nose/ Throat:  Patient denies sore throat and sinus problems.    Hematologic/Lymphatic:  Patient denies swollen glands and easy bruising.    Cardiovascular:  Patient denies leg swelling and chest pains.    Respiratory:  Patient denies cough and shortness of breath.    Endocrine:  Patient reports excessive thirst.     Musculoskeletal:  Patient reports back pain  and joint pain.     Neurological:  Patient denies headaches and dizziness.    Psychologic:  Patient reports depression and anxiety.     VITAL SIGNS:       02/15/2024 11:00 AM     BP 133/78 mmHg     Pulse 102 /min     Temperature 97.3 F / 36.2 C     MULTI-SYSTEM PHYSICAL EXAMINATION:      Constitutional: Well-nourished. No physical deformities. Normally developed. Good grooming.     Respiratory: No labored breathing, no use of accessory muscles.      Cardiovascular:  Normal temperature, normal extremity pulses, no swelling, no varicosities.     Skin: No paleness, no jaundice, no cyanosis. No lesion, no ulcer, no rash.     Musculoskeletal: Normal gait and station of head and neck.            Complexity of Data:   Source Of History:  Patient  Records Review:  Previous Patient Records  Urine Test Review:  Urinalysis  X-Ray Review: C.T. Abdomen/Pelvis: Reviewed Films. Reviewed Report. Multiple nonobstructing kidney stones bilaterally. Left kidney with a renal pelvis stone 1.3 cm in size if been visualized on x-ray. Minimal to no hydronephrosis bilaterally no other bladder or kidney irregularities.    PROCEDURES:    Visit Complexity - G2211      Urinalysis  Dipstick Dipstick Cont'd  Color: Yellow Bilirubin: Neg mg/dL  Appearance: Clear Ketones: Neg mg/dL  Specific Gravity: 8.969 Blood: Neg ery/uL  pH: 5.5 Protein: Trace mg/dL  Glucose: Neg mg/dL Urobilinogen: 0.2 mg/dL   Nitrites: Neg   Leukocyte Esterase: Neg leu/uL    ASSESSMENT:     ICD-10 Details  1 GU:  Renal calculus - N20.0 Undiagnosed New Problem   PLAN:   Medications  New Meds: Methocarbamol  750 MG Tablet 2 tablet PO TID #120 0 Refill(s)  oxyCODONE  HCl 5 MG Tablet 1 tablet PO Q 6 H #10 0 Refill(s)     Pharmacy Name:  [id number]    Address:  [address]     [place], [place] [id number]    Phone:  [phone]    Fax:  [phone]   Orders  Labs Urine Culture  Document  Letter(s):  Created for Patient: Clinical Summary   Notes:  Left 1.3 cm stone: Patient having some symptoms from stone but stable. We discussed options for treatment including ESWL, ureteroscopy, and PCNL. Discussed the risk benefits alternatives to all them. Patient is concerned about ESWL due to risk of residual stone and needing to pass fragments. Patient stated she would prefer to do ureteroscopy. Urine culture ordered today.   Stent in place Ucx sent bactrim prior to  procedure.   We discussed the risk benefits and alternatives to ureteroscopy. This includes bleeding, infection, damage to surrounding structures including the urethra, bladder, ureter, and kidney. With these possible injuries resulting in need for intervention in the future. We discussed inability to remove all the stone and requiring follow-up ureteroscopy. We also discussed the possibility of not being able to gain access to the kidney and the need for nephrostomy tube. Possibility of long-term stent was also discussed. The patient voiced their understanding and would like to proceed.

## 2024-04-18 DIAGNOSIS — E279 Disorder of adrenal gland, unspecified: Secondary | ICD-10-CM | POA: Diagnosis not present

## 2024-04-18 DIAGNOSIS — Z794 Long term (current) use of insulin: Secondary | ICD-10-CM | POA: Diagnosis not present

## 2024-04-18 DIAGNOSIS — E1165 Type 2 diabetes mellitus with hyperglycemia: Secondary | ICD-10-CM | POA: Diagnosis not present

## 2024-04-19 ENCOUNTER — Encounter (HOSPITAL_COMMUNITY)
Admission: RE | Disposition: A | Discharge status: Home / Self Care | Payer: Self-pay | Source: Home / Self Care | Attending: Urology

## 2024-04-19 ENCOUNTER — Ambulatory Visit (HOSPITAL_COMMUNITY)

## 2024-04-19 ENCOUNTER — Ambulatory Visit (HOSPITAL_COMMUNITY)
Admission: RE | Admit: 2024-04-19 | Discharge: 2024-04-19 | Disposition: A | Discharge status: Home / Self Care | Attending: Urology | Admitting: Urology

## 2024-04-19 ENCOUNTER — Encounter (HOSPITAL_COMMUNITY): Payer: Self-pay | Admitting: Urology

## 2024-04-19 ENCOUNTER — Ambulatory Visit (HOSPITAL_BASED_OUTPATIENT_CLINIC_OR_DEPARTMENT_OTHER): Admitting: Anesthesiology

## 2024-04-19 ENCOUNTER — Ambulatory Visit (HOSPITAL_COMMUNITY): Payer: Self-pay | Admitting: Medical

## 2024-04-19 DIAGNOSIS — K219 Gastro-esophageal reflux disease without esophagitis: Secondary | ICD-10-CM | POA: Insufficient documentation

## 2024-04-19 DIAGNOSIS — E119 Type 2 diabetes mellitus without complications: Secondary | ICD-10-CM | POA: Insufficient documentation

## 2024-04-19 DIAGNOSIS — J45909 Unspecified asthma, uncomplicated: Secondary | ICD-10-CM

## 2024-04-19 DIAGNOSIS — E669 Obesity, unspecified: Secondary | ICD-10-CM | POA: Diagnosis not present

## 2024-04-19 DIAGNOSIS — I10 Essential (primary) hypertension: Secondary | ICD-10-CM

## 2024-04-19 DIAGNOSIS — Z79899 Other long term (current) drug therapy: Secondary | ICD-10-CM | POA: Diagnosis not present

## 2024-04-19 DIAGNOSIS — Z87891 Personal history of nicotine dependence: Secondary | ICD-10-CM | POA: Diagnosis not present

## 2024-04-19 DIAGNOSIS — N202 Calculus of kidney with calculus of ureter: Secondary | ICD-10-CM | POA: Diagnosis not present

## 2024-04-19 DIAGNOSIS — E1140 Type 2 diabetes mellitus with diabetic neuropathy, unspecified: Secondary | ICD-10-CM

## 2024-04-19 DIAGNOSIS — Z9049 Acquired absence of other specified parts of digestive tract: Secondary | ICD-10-CM | POA: Diagnosis not present

## 2024-04-19 DIAGNOSIS — N200 Calculus of kidney: Secondary | ICD-10-CM | POA: Diagnosis present

## 2024-04-19 DIAGNOSIS — N201 Calculus of ureter: Secondary | ICD-10-CM

## 2024-04-19 HISTORY — PX: CYSTOSCOPY W/ RETROGRADES: SHX1426

## 2024-04-19 HISTORY — PX: CYSTOSCOPY/URETEROSCOPY/HOLMIUM LASER/STENT PLACEMENT: SHX6546

## 2024-04-19 LAB — GLUCOSE, CAPILLARY
Glucose-Capillary: 152 mg/dL — ABNORMAL HIGH (ref 70–99)
Glucose-Capillary: 137 mg/dL — ABNORMAL HIGH (ref 70–99)

## 2024-04-19 SURGERY — CYSTOSCOPY/URETEROSCOPY/HOLMIUM LASER/STENT PLACEMENT
Anesthesia: General | Laterality: Left

## 2024-04-19 MED ORDER — FENTANYL CITRATE PF 50 MCG/ML IJ SOSY
PREFILLED_SYRINGE | INTRAMUSCULAR | Status: AC
  Filled 2024-04-19: qty 3

## 2024-04-19 MED ORDER — KETOROLAC TROMETHAMINE 30 MG/ML IJ SOLN
INTRAMUSCULAR | Status: AC
  Filled 2024-04-19: qty 1

## 2024-04-19 MED ORDER — LACTATED RINGERS IV SOLN: INTRAVENOUS | Status: DC

## 2024-04-19 MED ORDER — HYOSCYAMINE SULFATE 0.125 MG PO TBDP: 0.1250 mg | ORAL_TABLET | Freq: Four times a day (QID) | ORAL | 0 refills | Status: DC | PRN

## 2024-04-19 MED ORDER — IOHEXOL 300 MG/ML  SOLN
INTRAMUSCULAR | Status: DC | PRN
Start: 2024-04-19 — End: 2024-04-19
  Administered 2024-04-19 (×2): 10 mL

## 2024-04-19 MED ORDER — OXYCODONE HCL 5 MG PO TABS: 5.0000 mg | ORAL_TABLET | Freq: Four times a day (QID) | ORAL | 0 refills | Status: DC | PRN

## 2024-04-19 MED ORDER — ONDANSETRON HCL 4 MG/2ML IJ SOLN
INTRAMUSCULAR | Status: DC | PRN
  Administered 2024-04-19 (×2): 4 mg via INTRAVENOUS

## 2024-04-19 MED ORDER — HYDROMORPHONE HCL 1 MG/ML IJ SOLN
0.2500 mg | INTRAMUSCULAR | Status: DC | PRN
  Administered 2024-04-19 (×8): 0.5 mg via INTRAVENOUS

## 2024-04-19 MED ORDER — OXYBUTYNIN CHLORIDE 5 MG PO TABS
ORAL_TABLET | ORAL | Status: AC
  Filled 2024-04-19: qty 1

## 2024-04-19 MED ORDER — CEFAZOLIN SODIUM-DEXTROSE 3-4 GM/150ML-% IV SOLN
3.0000 g | INTRAVENOUS | Status: AC
  Administered 2024-04-19 (×2): 3 g via INTRAVENOUS
  Filled 2024-04-19: qty 150

## 2024-04-19 MED ORDER — HYDROMORPHONE HCL 1 MG/ML IJ SOLN
INTRAMUSCULAR | Status: AC
  Filled 2024-04-19: qty 2

## 2024-04-19 MED ORDER — PROPOFOL 10 MG/ML IV BOLUS
INTRAVENOUS | Status: DC | PRN
  Administered 2024-04-19 (×2): 200 mg via INTRAVENOUS

## 2024-04-19 MED ORDER — OXYBUTYNIN CHLORIDE 5 MG PO TABS
5.0000 mg | ORAL_TABLET | Freq: Once | ORAL | Status: AC
  Administered 2024-04-19 (×2): 5 mg via ORAL

## 2024-04-19 MED ORDER — FENTANYL CITRATE (PF) 100 MCG/2ML IJ SOLN
INTRAMUSCULAR | Status: AC
  Filled 2024-04-19: qty 2

## 2024-04-19 MED ORDER — DEXAMETHASONE SODIUM PHOSPHATE 10 MG/ML IJ SOLN
INTRAMUSCULAR | Status: DC | PRN
  Administered 2024-04-19 (×2): 10 mg via INTRAVENOUS

## 2024-04-19 MED ORDER — LIDOCAINE HCL (CARDIAC) PF 100 MG/5ML IV SOSY
PREFILLED_SYRINGE | INTRAVENOUS | Status: DC | PRN
Start: 2024-04-19 — End: 2024-04-19
  Administered 2024-04-19 (×2): 60 mg via INTRAVENOUS

## 2024-04-19 MED ORDER — LIDOCAINE HCL (PF) 2 % IJ SOLN
INTRAMUSCULAR | Status: AC
  Filled 2024-04-19: qty 5

## 2024-04-19 MED ORDER — PHENAZOPYRIDINE HCL 200 MG PO TABS: 200.0000 mg | ORAL_TABLET | Freq: Three times a day (TID) | ORAL | 0 refills | Status: DC | PRN

## 2024-04-19 MED ORDER — ACETAMINOPHEN 10 MG/ML IV SOLN
INTRAVENOUS | Status: AC
  Filled 2024-04-19: qty 100

## 2024-04-19 MED ORDER — DEXAMETHASONE SODIUM PHOSPHATE 10 MG/ML IJ SOLN
INTRAMUSCULAR | Status: AC
  Filled 2024-04-19: qty 1

## 2024-04-19 MED ORDER — ONDANSETRON HCL 4 MG/2ML IJ SOLN
INTRAMUSCULAR | Status: AC
  Filled 2024-04-19: qty 2

## 2024-04-19 MED ORDER — ACETAMINOPHEN 10 MG/ML IV SOLN
1000.0000 mg | Freq: Once | INTRAVENOUS | Status: AC
  Administered 2024-04-19 (×2): 1000 mg via INTRAVENOUS

## 2024-04-19 MED ORDER — GENTAMICIN SULFATE 40 MG/ML IJ SOLN
240.0000 mg | Freq: Once | INTRAVENOUS | Status: AC
  Administered 2024-04-19 (×2): 240 mg via INTRAVENOUS
  Filled 2024-04-19: qty 6

## 2024-04-19 MED ORDER — HYDROMORPHONE HCL 1 MG/ML IJ SOLN
0.2500 mg | INTRAMUSCULAR | Status: DC | PRN
  Administered 2024-04-19 (×4): 0.5 mg via INTRAVENOUS

## 2024-04-19 MED ORDER — KETOROLAC TROMETHAMINE 30 MG/ML IJ SOLN
30.0000 mg | Freq: Once | INTRAMUSCULAR | Status: AC
  Administered 2024-04-19 (×2): 30 mg via INTRAVENOUS

## 2024-04-19 MED ORDER — CHLORHEXIDINE GLUCONATE 0.12 % MT SOLN
15.0000 mL | Freq: Once | OROMUCOSAL | Status: AC
  Administered 2024-04-19 (×2): 15 mL via OROMUCOSAL

## 2024-04-19 MED ORDER — METHOCARBAMOL 750 MG PO TABS
750.0000 mg | ORAL_TABLET | Freq: Four times a day (QID) | ORAL | 0 refills | Status: AC
Start: 2024-04-19 — End: 2024-04-24

## 2024-04-19 MED ORDER — PROPOFOL 500 MG/50ML IV EMUL
INTRAVENOUS | Status: AC
  Filled 2024-04-19: qty 50

## 2024-04-19 MED ORDER — PROPOFOL 10 MG/ML IV BOLUS
INTRAVENOUS | Status: AC
  Filled 2024-04-19: qty 20

## 2024-04-19 MED ORDER — ORAL CARE MOUTH RINSE: 15.0000 mL | Freq: Once | OROMUCOSAL | Status: AC

## 2024-04-19 MED ORDER — FENTANYL CITRATE (PF) 100 MCG/2ML IJ SOLN
INTRAMUSCULAR | Status: DC | PRN
  Administered 2024-04-19 (×16): 25 ug via INTRAVENOUS

## 2024-04-19 MED ORDER — MIDAZOLAM HCL 2 MG/2ML IJ SOLN
INTRAMUSCULAR | Status: AC
  Filled 2024-04-19: qty 2

## 2024-04-19 MED ORDER — SODIUM CHLORIDE 0.9 % IR SOLN
Status: DC | PRN
Start: 2024-04-19 — End: 2024-04-19
  Administered 2024-04-19 (×2): 3000 mL via INTRAVESICAL

## 2024-04-19 MED ORDER — HYDROMORPHONE HCL 1 MG/ML IJ SOLN
INTRAMUSCULAR | Status: AC
  Filled 2024-04-19: qty 1

## 2024-04-19 MED ORDER — FENTANYL CITRATE PF 50 MCG/ML IJ SOSY
25.0000 ug | PREFILLED_SYRINGE | INTRAMUSCULAR | Status: DC | PRN
  Administered 2024-04-19 (×6): 50 ug via INTRAVENOUS

## 2024-04-19 MED ORDER — AMISULPRIDE (ANTIEMETIC) 5 MG/2ML IV SOLN: 10.0000 mg | Freq: Once | INTRAVENOUS | Status: DC | PRN

## 2024-04-19 MED ORDER — INSULIN ASPART 100 UNIT/ML IJ SOLN
0.0000 [IU] | INTRAMUSCULAR | Status: DC | PRN
  Administered 2024-04-19 (×2): 2 [IU] via SUBCUTANEOUS
  Filled 2024-04-19: qty 1

## 2024-04-19 SURGICAL SUPPLY — 26 items
BAG COUNTER SPONGE SURGICOUNT (BAG) IMPLANT
BAG URO CATCHER STRL LF (MISCELLANEOUS) ×1 IMPLANT
BASKET ZERO TIP NITINOL 2.4FR (BASKET) IMPLANT
CATH URETL OPEN 5X70 (CATHETERS) IMPLANT
CLOTH BEACON ORANGE TIMEOUT ST (SAFETY) ×1 IMPLANT
EXTRACTOR STONE 1.7FRX115CM (UROLOGICAL SUPPLIES) IMPLANT
FIBER LASER MOSES 200 DFL (Laser) IMPLANT
FIBER LASER MOSES 365 DFL (Laser) IMPLANT
GLOVE BIO SURGEON STRL SZ8 (GLOVE) ×1 IMPLANT
GOWN STRL REUS W/ TWL XL LVL3 (GOWN DISPOSABLE) ×1 IMPLANT
GUIDEWIRE ANG ZIPWIRE 038X150 (WIRE) IMPLANT
GUIDEWIRE STR DUAL SENSOR (WIRE) ×1 IMPLANT
KIT TURNOVER KIT A (KITS) ×1 IMPLANT
LASER FIB FLEXIVA PULSE ID 365 (Laser) IMPLANT
LASER FIB FLEXIVA PULSE ID 550 (Laser) IMPLANT
LASER FIB FLEXIVA PULSE ID 910 (Laser) IMPLANT
MANIFOLD NEPTUNE II (INSTRUMENTS) ×1 IMPLANT
NS IRRIG 1000ML POUR BTL (IV SOLUTION) IMPLANT
PACK CYSTO (CUSTOM PROCEDURE TRAY) ×1 IMPLANT
SHEATH NAVIGATOR HD 11/13X28 (SHEATH) IMPLANT
SHEATH NAVIGATOR HD 11/13X36 (SHEATH) ×1 IMPLANT
SHEATH NAVIGATOR HD 12/14X36 (SHEATH) IMPLANT
STENT URET 6FRX24 CONTOUR (STENTS) IMPLANT
TRACTIP FLEXIVA PULS ID 200XHI (Laser) IMPLANT
TUBING CONNECTING 10 (TUBING) ×1 IMPLANT
TUBING UROLOGY SET (TUBING) ×1 IMPLANT

## 2024-04-19 NOTE — Transfer of Care (Signed)
 Immediate Anesthesia Transfer of Care Note  Patient: Alicia Lucero  Procedure(s) Performed: CYSTOSCOPY/URETEROSCOPY/HOLMIUM LASER/STENT PLACEMENT (Left) CYSTOSCOPY, WITH RETROGRADE PYELOGRAM (Left)  Patient Location: PACU  Anesthesia Type:General  Level of Consciousness: drowsy  Airway & Oxygen  Therapy: Patient Spontanous Breathing and Patient connected to face mask oxygen   Post-op Assessment: Report given to RN and Post -op Vital signs reviewed and stable  Post vital signs: Reviewed and stable  Last Vitals:  Vitals Value Taken Time  BP 139/75 04/19/24 13:51  Temp    Pulse 85 04/19/24 13:51  Resp 15 04/19/24 13:52  SpO2 100 % 04/19/24 13:51  Vitals shown include unfiled device data.  Last Pain:  Vitals:   04/19/24 1012  TempSrc: Oral         Complications: No notable events documented.

## 2024-04-19 NOTE — Anesthesia Postprocedure Evaluation (Signed)
 Anesthesia Post Note  Patient: TANZANIA BASHAM  Procedure(s) Performed: CYSTOSCOPY/URETEROSCOPY/HOLMIUM LASER/STENT PLACEMENT (Left) CYSTOSCOPY, WITH RETROGRADE PYELOGRAM (Left)     Patient location during evaluation: PACU Anesthesia Type: General Level of consciousness: awake and alert Pain management: pain level controlled Vital Signs Assessment: post-procedure vital signs reviewed and stable Respiratory status: spontaneous breathing, nonlabored ventilation, respiratory function stable and patient connected to nasal cannula oxygen  Cardiovascular status: blood pressure returned to baseline and stable Postop Assessment: no apparent nausea or vomiting Anesthetic complications: no   No notable events documented.  Last Vitals:  Vitals:   04/19/24 1600 04/19/24 1623  BP: 126/68 (!) 132/58  Pulse: 61 74  Resp: 10 18  Temp: (!) 36.4 C (!) 36.4 C  SpO2: 95% 94%    Last Pain:  Vitals:   04/19/24 1623  TempSrc: Oral  PainSc: 3                  Epifanio Lamar BRAVO

## 2024-04-19 NOTE — Op Note (Signed)
 Preoperative diagnosis: left ureteral calculus  Postoperative diagnosis: left ureteral calculus  Procedure:  Cystoscopy left ureteroscopy and stone removal Ureteroscopic laser lithotripsy left 60F x 24 ureteral stent placement w/ strings  left retrograde pyelography with interpretation  Surgeon: Steffan Pea MD.  Anesthesia: General  Complications: None  Intraoperative findings:  Narrow ureter apically dilated to accommodate access sheath 11-13 French Hard stone and blood in the renal pelvis made stone removal difficult able to fragment until all stone sizes less than 2 mm in size 6 x 24 double-J stent left in left ureter with strings.  Retrograde pyelogram interpretation: No evidence of extravasation no filling defects within the renal pelvis  EBL: Minimal  Specimens: left ureteral calculus  Disposition of specimens: [facility] for stone analysis  Indication: Alicia Lucero is a 63 y.o.   patient with a 1.3 cm left renal pelvis stone previously attempted ureteroscopy was unable to accommodate scope or sheath returns today after stenting for second stage ureteroscopy.. After reviewing the management options for treatment, the patient elected to proceed with the above surgical procedure(s). We have discussed the potential benefits and risks of the procedure, side effects of the proposed treatment, the likelihood of the patient achieving the goals of the procedure, and any potential problems that might occur during the procedure or recuperation. Informed consent has been obtained.   Description of procedure:  The patient was taken to the operating room and general anesthesia was induced.  The patient was placed in the dorsal lithotomy position, prepped and draped in the usual sterile fashion, and preoperative antibiotics were administered. A preoperative time-out was performed.   Cystourethroscopy was performed.  The patient's urethra was examined and was  normal. . Pan cystoscopy was then performed. There was no evidence for any bladder tumors, stones, or other mucosal pathology.    Attention then turned to the left ureteral orifice where a sensor wire was advanced adjacent to the left ureteral stent.  Fluoroscopy confirmed proper placement.  Next the grasper was used to remove the stent point to the meatus  A 0.38 sensor guidewire was then advanced up the left ureter via the stent into the renal pelvis under fluoroscopic guidance. A dual lumen catheter was then placed over the sensor wire into the ureter. A second sensor wire was then inserted in to the collecting system. Placement was confirmed by fluoro. The dual lumen catheter was then removed and a ureteral access sheath size 45cm 11-13 [place] was inserted over the sensor wire under fluoro to confirm proper placement at the ureteropelvic junction. The obturator and sensor wire were then removed, leaving the other wire on the outside of the sheath as a safety wire.   A flexible ureteroscopy was then advanced through the sheath and into the collecting system. Pan pyeloscopy was then performed.    The stone was then fragmented with a 365 micron holmium laser fiber on a setting of 0.8 and frequency of 1 Hz.   Of note the stone was very hard and not amenable to dusting but was eventually to bring it down until all stones less than 2 mm in size.  Can all stones were fragmented to sizes less than 2mm in diameter.   Pan pyeloscopy was then performed and no stones greater than 2mm were visualized.   The sheath was then removed leaving the safety wire in place. Visualization of the ureter on withdrawal of the sheath showed no injury to the ureter.   The safety wire was  then back loaded through the 19fr cystoscope. The cystoscope was then driven into the bladder with strings was placed without complication. Proper placement was confirmed with flouro.   The bladder was then emptied and the procedure  ended.  The patient appeared to tolerate the procedure well and without complications.  The patient was able to be awakened and transferred to the recovery unit in satisfactory condition.   Disposition: The tether of the stent was left on and secured to the ventral aspect of the patient's penis. Taped to the left leg.  Instructions for removing the stent have been provided to the patient.

## 2024-04-19 NOTE — Discharge Instructions (Signed)
 DISCHARGE INSTRUCTIONS FOR Ureteroscopy and/or Ureteral Stent Placement  MEDICATIONS:  1.  Robaxin  2. Tamsulosin   3. Hyoscyamine   4. Oxycodone    ACTIVITY:  1. No strenuous activity x 1week  2. No driving while on narcotic pain medications  3. Drink plenty of water  4. Continue to walk at home - it is normal to see blood in the urine while the stent is in place, so keep active, but don't over do it.  5. May return to work/school tomorrow or when you feel ready  6. You may experience some pain when urinating in the kidney on the side that was operated on while the stent is in place this is normal  WHAT IS NORMAL TO EXPERIENCE: It is normal to feel the urge to urinate while the stent is in place It is normal to have blood in your urine while the stent is in place  It sometimes can be normal to have pain in your kidney when you urinate   BATHING:  1. You can shower and we recommend daily showers  2. You have a string coming from your urethra: The stent string is attached to your ureteral stent. Do not pull on this until instructed.   DIET: You may return to your normal diet immediately. Because of the raw surface of your bladder, alcohol, spicy foods, acid type foods and drinks with caffeine may cause irritation or frequency and should be used in moderation. To keep your urine flowing freely and to avoid constipation, drink plenty of fluids during the day ( 8-10 glasses ). Tip: Avoid cranberry juice because it is very acidic.  SIGNS/SYMPTOMS TO CALL:  Please call us  if you have a fever greater than 101.5, uncontrolled nausea/vomiting, uncontrolled pain, dizziness, unable to urinate, bloody urine with clots greater than the size of a quarter, chest pain, shortness of breath, leg swelling, leg pain, redness around wound, drainage from wound, or any other concerns or questions.   You can reach us  at [phone].   FOLLOW-UP:  1. You may remove your stent in on Friday 04/22/24. To do this  go into the shower, grab hold of the tether coming from your urethra. Pull the tether consistent motion until the stent is removed from your body. The stent will be around 10 inches long with a curl on either end.  2. You you have been set up for f/u in 6-8 weeks

## 2024-04-19 NOTE — Anesthesia Procedure Notes (Signed)
 Procedure Name: LMA Insertion Date/Time: 04/19/2024 12:08 PM  Performed by: Therisa Doyal CROME, CRNALMA Size: 4.0 Number of attempts: 1 Placement Confirmation: positive ETCO2 and breath sounds checked- equal and bilateral Tube secured with: Tape Dental Injury: Teeth and Oropharynx as per pre-operative assessment

## 2024-04-19 NOTE — Anesthesia Procedure Notes (Signed)
 Date/Time: 04/19/2024 12:08 PM  Performed by: Therisa Doyal CROME, CRNAPatient Re-evaluated:Patient Re-evaluated prior to induction Oxygen  Delivery Method: Circle system utilized Preoxygenation: Pre-oxygenation with 100% oxygen  Induction Type: IV induction LMA: LMA inserted LMA Size: 4.0 Number of attempts: 1 Placement Confirmation: positive ETCO2 and breath sounds checked- equal and bilateral Tube secured with: Tape Dental Injury: Teeth and Oropharynx as per pre-operative assessment

## 2024-04-20 ENCOUNTER — Encounter (HOSPITAL_COMMUNITY): Payer: Self-pay | Admitting: Urology

## 2024-04-21 ENCOUNTER — Observation Stay (HOSPITAL_COMMUNITY)
Admission: EM | Admit: 2024-04-21 | Discharge: 2024-04-22 | Disposition: A | Discharge status: Home / Self Care | Attending: Internal Medicine | Admitting: Internal Medicine

## 2024-04-21 ENCOUNTER — Emergency Department (HOSPITAL_COMMUNITY)

## 2024-04-21 ENCOUNTER — Encounter (HOSPITAL_COMMUNITY): Payer: Self-pay | Admitting: Emergency Medicine

## 2024-04-21 ENCOUNTER — Other Ambulatory Visit: Payer: Self-pay

## 2024-04-21 DIAGNOSIS — F419 Anxiety disorder, unspecified: Secondary | ICD-10-CM | POA: Insufficient documentation

## 2024-04-21 DIAGNOSIS — N200 Calculus of kidney: Secondary | ICD-10-CM | POA: Diagnosis not present

## 2024-04-21 DIAGNOSIS — F3175 Bipolar disorder, in partial remission, most recent episode depressed: Secondary | ICD-10-CM | POA: Diagnosis present

## 2024-04-21 DIAGNOSIS — E1169 Type 2 diabetes mellitus with other specified complication: Secondary | ICD-10-CM | POA: Diagnosis present

## 2024-04-21 DIAGNOSIS — F32A Depression, unspecified: Secondary | ICD-10-CM | POA: Insufficient documentation

## 2024-04-21 DIAGNOSIS — Z87891 Personal history of nicotine dependence: Secondary | ICD-10-CM | POA: Insufficient documentation

## 2024-04-21 DIAGNOSIS — N134 Hydroureter: Secondary | ICD-10-CM | POA: Diagnosis not present

## 2024-04-21 DIAGNOSIS — E11319 Type 2 diabetes mellitus with unspecified diabetic retinopathy without macular edema: Secondary | ICD-10-CM | POA: Diagnosis not present

## 2024-04-21 DIAGNOSIS — K219 Gastro-esophageal reflux disease without esophagitis: Secondary | ICD-10-CM | POA: Diagnosis present

## 2024-04-21 DIAGNOSIS — I10 Essential (primary) hypertension: Secondary | ICD-10-CM | POA: Diagnosis not present

## 2024-04-21 DIAGNOSIS — N132 Hydronephrosis with renal and ureteral calculous obstruction: Secondary | ICD-10-CM | POA: Diagnosis not present

## 2024-04-21 DIAGNOSIS — F3181 Bipolar II disorder: Secondary | ICD-10-CM | POA: Diagnosis present

## 2024-04-21 DIAGNOSIS — E1140 Type 2 diabetes mellitus with diabetic neuropathy, unspecified: Secondary | ICD-10-CM | POA: Diagnosis not present

## 2024-04-21 DIAGNOSIS — R109 Unspecified abdominal pain: Secondary | ICD-10-CM | POA: Diagnosis not present

## 2024-04-21 DIAGNOSIS — Z79899 Other long term (current) drug therapy: Secondary | ICD-10-CM | POA: Insufficient documentation

## 2024-04-21 DIAGNOSIS — K5730 Diverticulosis of large intestine without perforation or abscess without bleeding: Secondary | ICD-10-CM | POA: Diagnosis not present

## 2024-04-21 DIAGNOSIS — E1142 Type 2 diabetes mellitus with diabetic polyneuropathy: Secondary | ICD-10-CM | POA: Diagnosis present

## 2024-04-21 DIAGNOSIS — Z794 Long term (current) use of insulin: Secondary | ICD-10-CM | POA: Diagnosis not present

## 2024-04-21 DIAGNOSIS — N201 Calculus of ureter: Secondary | ICD-10-CM | POA: Diagnosis not present

## 2024-04-21 LAB — URINALYSIS, ROUTINE W REFLEX MICROSCOPIC
Bacteria, UA: NONE SEEN
Bilirubin Urine: NEGATIVE
Glucose, UA: NEGATIVE mg/dL
Ketones, ur: NEGATIVE mg/dL
Nitrite: NEGATIVE
Protein, ur: 100 mg/dL — AB
RBC / HPF: >50 RBC/hpf (ref 0–5)
Specific Gravity, Urine: 1.025 (ref 1.005–1.030)
pH: 5 (ref 5.0–8.0)

## 2024-04-21 LAB — CBC WITH DIFFERENTIAL/PLATELET
Abs Immature Granulocytes: 0.12 K/uL — ABNORMAL HIGH (ref 0.00–0.07)
Basophils Absolute: 0.1 K/uL (ref 0.0–0.1)
Basophils Relative: 1 %
Eosinophils Absolute: 0.9 K/uL — ABNORMAL HIGH (ref 0.0–0.5)
Eosinophils Relative: 6 %
HCT: 40.8 % (ref 36.0–46.0)
Hemoglobin: 12.1 g/dL (ref 12.0–15.0)
Immature Granulocytes: 1 %
Lymphocytes Relative: 21 %
Lymphs Abs: 3 K/uL (ref 0.7–4.0)
MCH: 27.1 pg (ref 26.0–34.0)
MCHC: 29.7 g/dL — ABNORMAL LOW (ref 30.0–36.0)
MCV: 91.3 fL (ref 80.0–100.0)
Monocytes Absolute: 1.2 K/uL — ABNORMAL HIGH (ref 0.1–1.0)
Monocytes Relative: 8 %
Neutro Abs: 9.1 K/uL — ABNORMAL HIGH (ref 1.7–7.7)
Neutrophils Relative %: 63 %
Platelets: 360 K/uL (ref 150–400)
RBC: 4.47 MIL/uL (ref 3.87–5.11)
RDW: 14.9 % (ref 11.5–15.5)
WBC: 14.4 K/uL — ABNORMAL HIGH (ref 4.0–10.5)
nRBC: 0 % (ref 0.0–0.2)

## 2024-04-21 LAB — COMPREHENSIVE METABOLIC PANEL WITH GFR
ALT: 20 U/L (ref 0–44)
AST: 19 U/L (ref 15–41)
Albumin: 3.2 g/dL — ABNORMAL LOW (ref 3.5–5.0)
Alkaline Phosphatase: 88 U/L (ref 38–126)
Anion gap: 9 (ref 5–15)
BUN: 21 mg/dL — ABNORMAL HIGH (ref 6–20)
CO2: 23 mmol/L (ref 22–32)
Calcium: 8.3 mg/dL — ABNORMAL LOW (ref 8.9–10.3)
Chloride: 104 mmol/L (ref 98–111)
Creatinine, Ser: 0.98 mg/dL (ref 0.44–1.00)
GFR, Estimated: >60 mL/min (ref >60)
Glucose, Bld: 159 mg/dL — ABNORMAL HIGH (ref 70–99)
Potassium: 3.9 mmol/L (ref 3.5–5.1)
Sodium: 136 mmol/L (ref 135–145)
Total Bilirubin: 0.4 mg/dL (ref 0.0–1.2)
Total Protein: 6.4 g/dL — ABNORMAL LOW (ref 6.5–8.1)

## 2024-04-21 LAB — GLUCOSE, CAPILLARY
Glucose-Capillary: 87 mg/dL (ref 70–99)
Glucose-Capillary: 109 mg/dL — ABNORMAL HIGH (ref 70–99)
Glucose-Capillary: 131 mg/dL — ABNORMAL HIGH (ref 70–99)

## 2024-04-21 MED ORDER — OXYCODONE HCL 5 MG PO TABS
5.0000 mg | ORAL_TABLET | Freq: Four times a day (QID) | ORAL | Status: DC | PRN
  Administered 2024-04-21 – 2024-04-22 (×3): 5 mg via ORAL
  Filled 2024-04-21 (×3): qty 1

## 2024-04-21 MED ORDER — PHENAZOPYRIDINE HCL 200 MG PO TABS: 200.0000 mg | ORAL_TABLET | Freq: Three times a day (TID) | ORAL | Status: DC | PRN

## 2024-04-21 MED ORDER — ACETAMINOPHEN 325 MG PO TABS: 650.0000 mg | ORAL_TABLET | Freq: Four times a day (QID) | ORAL | Status: DC | PRN

## 2024-04-21 MED ORDER — ALBUTEROL SULFATE (2.5 MG/3ML) 0.083% IN NEBU: 2.5000 mg | INHALATION_SOLUTION | RESPIRATORY_TRACT | Status: DC | PRN

## 2024-04-21 MED ORDER — LACTATED RINGERS IV SOLN: INTRAVENOUS | Status: DC

## 2024-04-21 MED ORDER — HYDROMORPHONE HCL 1 MG/ML IJ SOLN
0.5000 mg | INTRAMUSCULAR | Status: DC | PRN
  Administered 2024-04-21 (×3): 0.5 mg via INTRAVENOUS
  Filled 2024-04-21 (×4): qty 0.5

## 2024-04-21 MED ORDER — HYDROMORPHONE HCL 1 MG/ML IJ SOLN
0.5000 mg | Freq: Once | INTRAMUSCULAR | Status: AC
  Administered 2024-04-21: 0.5 mg via INTRAVENOUS
  Filled 2024-04-21: qty 1

## 2024-04-21 MED ORDER — INSULIN ASPART 100 UNIT/ML IJ SOLN
0.0000 [IU] | Freq: Three times a day (TID) | INTRAMUSCULAR | Status: DC
  Filled 2024-04-21: qty 0.15

## 2024-04-21 MED ORDER — ONDANSETRON HCL 4 MG PO TABS: 4.0000 mg | ORAL_TABLET | Freq: Four times a day (QID) | ORAL | Status: DC | PRN

## 2024-04-21 MED ORDER — LACTATED RINGERS IV SOLN: INTRAVENOUS | Status: AC

## 2024-04-21 MED ORDER — ONDANSETRON HCL 4 MG/2ML IJ SOLN
4.0000 mg | Freq: Once | INTRAMUSCULAR | Status: AC
  Administered 2024-04-21: 4 mg via INTRAVENOUS
  Filled 2024-04-21: qty 2

## 2024-04-21 MED ORDER — INSULIN ASPART 100 UNIT/ML IJ SOLN
0.0000 [IU] | Freq: Every day | INTRAMUSCULAR | Status: DC
  Filled 2024-04-21: qty 0.05

## 2024-04-21 MED ORDER — VORTIOXETINE HBR 5 MG PO TABS
20.0000 mg | ORAL_TABLET | Freq: Every day | ORAL | Status: DC
  Administered 2024-04-22: 20 mg via ORAL
  Filled 2024-04-21 (×2): qty 4

## 2024-04-21 MED ORDER — ACETAMINOPHEN 650 MG RE SUPP: 650.0000 mg | Freq: Four times a day (QID) | RECTAL | Status: DC | PRN

## 2024-04-21 MED ORDER — TRAZODONE HCL 50 MG PO TABS
25.0000 mg | ORAL_TABLET | Freq: Every evening | ORAL | Status: DC | PRN
  Filled 2024-04-21: qty 1

## 2024-04-21 MED ORDER — INSULIN GLARGINE-YFGN 100 UNIT/ML ~~LOC~~ SOLN
70.0000 [IU] | Freq: Every day | SUBCUTANEOUS | Status: DC
  Administered 2024-04-21: 70 [IU] via SUBCUTANEOUS
  Filled 2024-04-21 (×2): qty 0.7

## 2024-04-21 MED ORDER — PNEUMOCOCCAL 20-VAL CONJ VACC 0.5 ML IM SUSY
0.5000 mL | PREFILLED_SYRINGE | INTRAMUSCULAR | Status: AC
  Administered 2024-04-22: 0.5 mL via INTRAMUSCULAR
  Filled 2024-04-21 (×2): qty 0.5

## 2024-04-21 MED ORDER — PANTOPRAZOLE SODIUM 40 MG PO TBEC
40.0000 mg | DELAYED_RELEASE_TABLET | Freq: Every day | ORAL | Status: DC
  Administered 2024-04-21 – 2024-04-22 (×2): 40 mg via ORAL
  Filled 2024-04-21 (×2): qty 1

## 2024-04-21 MED ORDER — LAMOTRIGINE 100 MG PO TABS
400.0000 mg | ORAL_TABLET | Freq: Every evening | ORAL | Status: DC
  Administered 2024-04-21: 400 mg via ORAL
  Filled 2024-04-21: qty 4

## 2024-04-21 MED ORDER — BREXPIPRAZOLE 1 MG PO TABS
0.5000 mg | ORAL_TABLET | Freq: Every evening | ORAL | Status: DC
  Administered 2024-04-21: 0.5 mg via ORAL
  Filled 2024-04-21: qty 0.5

## 2024-04-21 MED ORDER — LISINOPRIL 5 MG PO TABS
5.0000 mg | ORAL_TABLET | Freq: Every day | ORAL | Status: DC
Start: 2024-04-21 — End: 2024-04-22
  Administered 2024-04-21: 5 mg via ORAL
  Filled 2024-04-21 (×2): qty 1

## 2024-04-21 MED ORDER — OXYBUTYNIN CHLORIDE 5 MG PO TABS
5.0000 mg | ORAL_TABLET | Freq: Three times a day (TID) | ORAL | Status: DC
  Administered 2024-04-21 – 2024-04-22 (×4): 5 mg via ORAL
  Filled 2024-04-21 (×4): qty 1

## 2024-04-21 MED ORDER — KETOROLAC TROMETHAMINE 30 MG/ML IJ SOLN
30.0000 mg | Freq: Once | INTRAMUSCULAR | Status: AC
  Administered 2024-04-21: 30 mg via INTRAVENOUS
  Filled 2024-04-21: qty 1

## 2024-04-21 MED ORDER — HYDROMORPHONE HCL 1 MG/ML IJ SOLN
1.0000 mg | Freq: Once | INTRAMUSCULAR | Status: AC
  Administered 2024-04-21: 1 mg via INTRAVENOUS
  Filled 2024-04-21: qty 1

## 2024-04-21 MED ORDER — ENOXAPARIN SODIUM 80 MG/0.8ML IJ SOSY
80.0000 mg | PREFILLED_SYRINGE | Freq: Every day | INTRAMUSCULAR | Status: DC
  Administered 2024-04-21 – 2024-04-22 (×2): 80 mg via SUBCUTANEOUS
  Filled 2024-04-21 (×3): qty 0.8

## 2024-04-21 MED ORDER — LISDEXAMFETAMINE DIMESYLATE 70 MG PO CAPS
70.0000 mg | ORAL_CAPSULE | Freq: Every day | ORAL | Status: DC
  Administered 2024-04-22: 70 mg via ORAL
  Filled 2024-04-21: qty 1

## 2024-04-21 MED ORDER — SODIUM CHLORIDE 0.9 % IV SOLN: INTRAVENOUS | Status: DC

## 2024-04-21 MED ORDER — ONDANSETRON HCL 4 MG/2ML IJ SOLN
4.0000 mg | Freq: Four times a day (QID) | INTRAMUSCULAR | Status: DC | PRN
Start: 2024-04-21 — End: 2024-04-22

## 2024-04-21 MED ORDER — ROSUVASTATIN CALCIUM 20 MG PO TABS
20.0000 mg | ORAL_TABLET | Freq: Every day | ORAL | Status: DC
  Administered 2024-04-21 – 2024-04-22 (×2): 20 mg via ORAL
  Filled 2024-04-21 (×2): qty 1

## 2024-04-21 MED ORDER — GABAPENTIN 400 MG PO CAPS
1200.0000 mg | ORAL_CAPSULE | Freq: Two times a day (BID) | ORAL | Status: DC
  Administered 2024-04-21 – 2024-04-22 (×3): 1200 mg via ORAL
  Filled 2024-04-21 (×3): qty 3

## 2024-04-21 MED ORDER — HYDROMORPHONE HCL 1 MG/ML IJ SOLN: 1.0000 mg | Freq: Once | INTRAMUSCULAR | Status: DC

## 2024-04-21 NOTE — ED Triage Notes (Addendum)
 Pt presents to the ED via POV with complaints of flank pain. She notes having stent placement  2 days ago that came out tonight. Took an oxycodone  PTA and received 50mcg of fentanyl  by EMS with minimal improvement. A&Ox4 at this time. Denies CP or SOB.

## 2024-04-21 NOTE — ED Provider Notes (Signed)
 [place] EMERGENCY DEPARTMENT AT [facility] Provider Note   CSN: [id number] Arrival date & time: 04/21/24  9656     Patient presents with: Flank Pain   Alicia Lucero is a 63 y.o. female.   63 year old female brought in by EMS with complaint of left flank pain with nausea.  Patient reports having stent placed in left ureter 2 days ago due to large stone.  She reported to having some urinary incontinence, called her urologist who told her this was not normal following the procedure but could be due to a loose stent.  Patient checked her stent and states that her stent then began to come out.  Patient reports since stent has come out, she has had left flank pain and is very uncomfortable.  She was provided with fentanyl  with EMS with limited relief.  Denies fevers or chills.  No other complaints or concerns.       Prior to Admission medications   Medication Sig Start Date End Date Taking? Authorizing Provider  albuterol  (VENTOLIN  HFA) 108 (90 Base) MCG/ACT inhaler inhale 2 puffs by mouth every 4 hours if needed for cough wheezing or shortness of breath 12/25/17  Yes Gretta Ozell CROME, PA-C  Brexpiprazole  0.5 MG TABS Take 0.5 mg by mouth every evening. 07/24/21  Yes [provider]  esomeprazole (NEXIUM) 40 MG capsule Take 40 mg by mouth daily. 11/28/19  Yes [provider]  gabapentin  (NEURONTIN ) 600 MG tablet Take 1,200 mg by mouth 2 (two) times daily. 04/21/18  Yes [provider]  hyoscyamine  (ANASPAZ ) 0.125 MG TBDP disintergrating tablet Place 1 tablet (0.125 mg total) under the tongue every 6 (six) hours as needed for up to 20 doses. 04/19/24  Yes Showalter, Steffan BROCKS, MD  insulin  glargine (LANTUS ) 100 UNIT/ML Solostar Pen Inject 64 Units into the skin daily. Patient taking differently: Inject 70 Units into the skin at bedtime. 07/03/22  Yes Lucius Krabbe, NP  lamoTRIgine  (LAMICTAL ) 200 MG tablet Take 2 tablets (400 mg total) by mouth every  evening. 02/11/16  Yes Smith, Kristi M, MD  lisdexamfetamine (VYVANSE ) 70 MG capsule Take 70 mg by mouth every morning. 11/23/23  Yes [provider]  lisinopril  (ZESTRIL ) 5 MG tablet TAKE 1 TABLET(5 MG) BY MOUTH DAILY 02/02/24  Yes Hudnell, Krabbe, NP  metFORMIN  (GLUCOPHAGE -XR) 500 MG 24 hr tablet Take 1 tablet (500 mg total) by mouth 2 (two) times daily with a meal. Patient taking differently: Take 1,000 mg by mouth 2 (two) times daily with a meal. 09/10/23  Yes Hudnell, Krabbe, NP  methocarbamol  (ROBAXIN ) 750 MG tablet Take 1 tablet (750 mg total) by mouth 4 (four) times daily for 20 doses. 04/19/24 04/24/24 Yes Showalter, Steffan BROCKS, MD  Multiple Vitamins-Minerals (WOMENS MULTIVITAMIN PLUS PO) Take 1 tablet by mouth daily.    Yes [provider]  ondansetron  (ZOFRAN -ODT) 4 MG disintegrating tablet 4mg  ODT q4 hours prn nausea/vomit Patient taking differently: Take 4 mg by mouth every 8 (eight) hours as needed for vomiting or nausea. 02/12/24  Yes Patt Alm Macho, MD  oxyCODONE  (ROXICODONE ) 5 MG immediate release tablet Take 1 tablet (5 mg total) by mouth every 6 (six) hours as needed for up to 16 doses for severe pain (pain score 7-10). 04/19/24  Yes Showalter, Steffan BROCKS, MD  OZEMPIC , 2 MG/DOSE, 8 MG/3ML SOPN INJECT 2 MG INTO THE SKIN ONCE WEEKLY 10/21/23  Yes Lucius Krabbe, NP  phenazopyridine  (PYRIDIUM ) 200 MG tablet Take 1 tablet (200 mg total) by  mouth 3 (three) times daily as needed for up to 6 doses. 04/19/24  Yes Showalter, Steffan BROCKS, MD  rosuvastatin  (CRESTOR ) 20 MG tablet Take 1 tablet (20 mg total) by mouth daily. 10/12/23  Yes Lucius Krabbe, NP  tamsulosin  (FLOMAX ) 0.4 MG CAPS capsule Take 1 capsule (0.4 mg total) by mouth daily after supper. 03/17/24  Yes Showalter, Steffan BROCKS, MD  vortioxetine  HBr (TRINTELLIX ) 20 MG TABS tablet Take 20 mg by mouth daily. 11/21/23  Yes [provider]  ACCU-CHEK AVIVA PLUS test strip USE AS DIRECTED 05/07/14   Mario Million, MD   ACCU-CHEK SOFTCLIX LANCETS lancets USE AS DIRECTED    Hadassah Moats M, PA-C  hyoscyamine  (ANASPAZ ) 0.125 MG TBDP disintergrating tablet Place 1 tablet (0.125 mg total) under the tongue every 6 (six) hours as needed for up to 20 doses. 03/17/24   Shane Steffan BROCKS, MD  Insulin  Pen Needle (B-D ULTRAFINE III SHORT PEN) 31G X 8 MM MISC daily 03/12/22   Lucius Krabbe, NP  ketoconazole  (NIZORAL ) 2 % cream Apply 1 application topically 2 (two) times daily. Patient not taking: Reported on 04/21/2024 07/19/15   Mario Million, MD  oxyCODONE  (OXY IR/ROXICODONE ) 5 MG immediate release tablet Take 5 mg by mouth every 6 (six) hours as needed for severe pain (pain score 7-10). 02/16/24   [provider]  phenazopyridine  (PYRIDIUM ) 200 MG tablet Take 1 tablet (200 mg total) by mouth 3 (three) times daily as needed for up to 6 doses. Patient taking differently: Take 200 mg by mouth 3 (three) times daily as needed for pain. 03/17/24   Shane Steffan BROCKS, MD    Allergies: Codeine , Doxycycline , Empagliflozin -linagliptin , Jardiance  [empagliflozin ], and Other    Review of Systems Negative except as per HPI Updated Vital Signs BP (!) 137/102 (BP Location: Right Arm)   Pulse 84   Temp 98.2 F (36.8 C) (Oral)   Resp 17   Ht 5' 5 (1.651 m)   Wt (!) 146.2 kg   LMP 03/28/2018   SpO2 97%   BMI 53.64 kg/m   Physical Exam Vitals and nursing note reviewed.  Constitutional:      General: She is not in acute distress.    Appearance: She is well-developed. She is not diaphoretic.  HENT:     Head: Normocephalic and atraumatic.  Pulmonary:     Effort: Pulmonary effort is normal.  Abdominal:     Palpations: Abdomen is soft.     Tenderness: There is no abdominal tenderness. There is no guarding or rebound.  Skin:    General: Skin is warm and dry.  Neurological:     Mental Status: She is alert and oriented to person, place, and time.  Psychiatric:        Behavior: Behavior normal.     (all  labs ordered are listed, but only abnormal results are displayed) Labs Reviewed  CBC WITH DIFFERENTIAL/PLATELET - Abnormal; Notable for the following components:      Result Value   WBC 14.4 (*)    MCHC 29.7 (*)    Neutro Abs 9.1 (*)    Monocytes Absolute 1.2 (*)    Eosinophils Absolute 0.9 (*)    Abs Immature Granulocytes 0.12 (*)    All other components within normal limits  COMPREHENSIVE METABOLIC PANEL WITH GFR - Abnormal; Notable for the following components:   Glucose, Bld 159 (*)    BUN 21 (*)    Calcium  8.3 (*)    Total Protein 6.4 (*)  Albumin 3.2 (*)    All other components within normal limits  URINALYSIS, ROUTINE W REFLEX MICROSCOPIC - Abnormal; Notable for the following components:   APPearance CLOUDY (*)    Hgb urine dipstick LARGE (*)    Protein, ur 100 (*)    Leukocytes,Ua TRACE (*)    All other components within normal limits    EKG: None  Radiology: DG C-Arm 1-60 Min-No Report Result Date: 04/19/2024 Fluoroscopy was utilized by the requesting physician.  No radiographic interpretation.   DG C-Arm 1-60 Min-No Report Result Date: 04/19/2024 Fluoroscopy was utilized by the requesting physician.  No radiographic interpretation.     Procedures   Medications Ordered in the ED  ketorolac  (TORADOL ) 30 MG/ML injection 30 mg (30 mg Intravenous Given 04/21/24 0436)  HYDROmorphone  (DILAUDID ) injection 1 mg (1 mg Intravenous Given 04/21/24 0521)    Clinical Course as of 04/21/24 0638  Thu Apr 21, 2024  0635 Flank pain. Left uretal stent placed recently. CT pending. May need to reach out to urology if pain not controlled.  [JR]    Clinical Course User Index [JR] Lang Norleen POUR, PA-C                                 Medical Decision Making Amount and/or Complexity of Data Reviewed Labs: ordered.  Risk Prescription drug management.   This patient presents to the ED for concern of left flank pain, this involves an extensive number of treatment options,  and is a complaint that carries with it a high risk of complications and morbidity.  The differential diagnosis includes but not limited to constipation, colitis, ureterolithiasis, hydronephrosis   Co morbidities / Chronic conditions that complicate the patient evaluation  Diabetes, bipolar 1 disorder, allergy, GERD, hyperlipidemia, fatty liver, depression, drug use, PCOS, anxiety, diabetic neuropathy, hypertension, diabetes, kidney stones   Additional history obtained:  Additional history obtained from EMR External records from outside source obtained and reviewed including op note dated 04/19/2024 with Dr. Shane   Lab Tests:  I Ordered, and personally interpreted labs.  The pertinent results include: CBC with mild leukocytosis at 14.4, not significantly changed compared to prior on file.  CMP without significant findings.  Urinalysis is in process at time of signout to oncoming provider.   Imaging Studies ordered:  I ordered imaging studies including CT stone study Pending at time of signout to oncoming provider   Problem List / ED Course / Critical interventions / Medication management  63 year old female brought in by EMS with flank pain, recent ureteral stent, stent came out at home.  Pain not improved with fentanyl  from EMS.  Provided with Toradol  and Dilaudid .  Urine likely secondary to having a stent, she is afebrile.  Care signed out at change of shift pending CT. I ordered medication including Toradol .  Patient reports high pain tolerance and needing something more than Toradol  and was provided with Dilaudid . I have reviewed the patients home medicines and have made adjustments as needed   Social Determinants of Health:  Has PCP   Test / Admission - Considered:  Disposition pending at time of signout to oncoming provider      Final diagnoses:  Left flank pain    ED Discharge Orders     None          Beverley Leita DELENA DEVONNA 04/21/24 9361     Carita Senior, MD 04/21/24 [phone]

## 2024-04-21 NOTE — ED Notes (Signed)
 PIV SL for transport to inpatient room

## 2024-04-21 NOTE — Progress Notes (Signed)
   04/21/24 1533  TOC Brief Assessment  Insurance and Status Reviewed  Patient has primary care physician Yes Orlinda, Stephanie, NP)  Home environment has been reviewed Home  Prior level of function: Independent  Prior/Current Home Services No current home services  Social Drivers of Health Review SDOH reviewed no interventions necessary  Readmission risk has been reviewed Yes  Transition of care needs no transition of care needs at this time

## 2024-04-21 NOTE — ED Provider Notes (Signed)
 Patient signed out to me from PA Leita Chancy.  Here for left flank pain.  Left ureteral stent placed for 2 days ago.  She believes the stent has dislodged.  CT is pending. Physical Exam  BP 110/83   Pulse 68   Temp 98.2 F (36.8 C) (Oral)   Resp 18   Ht 5' 5 (1.651 m)   Wt (!) 146.2 kg   LMP 03/28/2018   SpO2 98%   BMI 53.64 kg/m   Physical Exam  Procedures  Procedures  ED Course / MDM   Clinical Course as of 04/21/24 1018  Thu Apr 21, 2024  9364 Flank pain. Left uretal stent placed recently. CT pending. May need to reach out to urology if pain not controlled.  [JR]    Clinical Course User Index [JR] Lang Norleen POUR, PA-C   Medical Decision Making Amount and/or Complexity of Data Reviewed Labs: ordered. Radiology: ordered.  Risk Prescription drug management. Decision regarding hospitalization.   CT showing 3 mm distal left ureteral calculus with moderate left hydronephrosis.  White count is 14,000.  She is afebrile and nontoxic-appearing and urinalysis not convincing for UTI thus holding off on antibiotics at this time.  Discussed patient with NP Ole Bourdon.  Advised admission for observation pain control and IV fluids.  On reassessment she was still in considerable amount of pain.  Give her another dose of Dilaudid .  Admitted to hospital service with Dr. Zella.       Lang Norleen POUR, PA-C 04/21/24 1022    Doretha Folks, MD 04/21/24 [phone]

## 2024-04-21 NOTE — ED Notes (Signed)
 Pt ambulatory to the restroom

## 2024-04-21 NOTE — Plan of Care (Signed)
   Problem: Coping: Goal: Ability to adjust to condition or change in health will improve Outcome: Progressing   Problem: Nutritional: Goal: Maintenance of adequate nutrition will improve Outcome: Progressing   Problem: Skin Integrity: Goal: Risk for impaired skin integrity will decrease Outcome: Progressing   Problem: Education: Goal: Knowledge of General Education information will improve Description: Including pain rating scale, medication(s)/side effects and non-pharmacologic comfort measures Outcome: Progressing

## 2024-04-21 NOTE — Consult Note (Addendum)
 Urology Consult Note   Requesting Attending Physician:  Doretha Folks, MD Service Providing Consult: Urology  Consulting Attending: Dr. Selma   Reason for Consult: Premature removal of tethered stent, flank pain  HPI: Alicia Lucero is seen in consultation for reasons noted above at the request of Doretha Folks, MD. patient originally seen in the emergency department on 03/17/2024 with 13 mm stone in left renal pelvis.  Laser lithotripsy was attempted at this time but ureter was too tight to navigate.  Ureteral stent was placed at that time.  Patient underwent dilation and laser lithotripsy on 04/19/2024.  The stone was notably hard and could not be dusted, leaving behind roughly 2 mm fragments.  Tethered stent was placed on the inner thigh.  Patient inadvertently remove this on the evening of 04/20/2024.  She reports being in excruciating pain within a couple of hours, prompting her return to the emergency department via EMS.  CT A/P notes a remaining 3 mm fragment.  On my arrival patient was alert, oriented, in no distress.  She was resting comfortably.  We reviewed her case and plan and the available options.  All questions were answered to her satisfaction. ------------------  Assessment:   63 y.o. female with prematurely removed left ureteral stent with a 3 mm fragment of stone material remaining.   Recommendations: #  Ureteral stone #  Flank pain  Patient requested an increase in her oxycodone .  She maintains that she is opiate nave and with her habitus and Mallampati 4, she is at increased risk for respiratory depression.  Shared decision made to observe overnight with IV hydration, strain urine, and manage pain.  Hope to discharge in the morning.   States hyoscyamine  was ineffective. Scheduled Ditropan .   Case and plan discussed with Dr. Selma  Past Medical History: Past Medical History:  Diagnosis Date   Allergy    Anxiety 1992   Arthritis 2007   Back pain    Benign  essential HTN    Bipolar 1 disorder (HCC)    Cataract 2019   Chest pain    De Quervain's disease (radial styloid tenosynovitis) 07/08/2019   Depression    Diabetes mellitus without complication (HCC)    Diabetic peripheral neuropathy (HCC)    Drug use    Dyspnea    Fatty liver    Gallbladder problem    GERD (gastroesophageal reflux disease)    History of kidney stones    Hyperlipidemia    Infertility, female    Joint pain    Microalbuminuria 06/07/2021   Neuropathy    PCOS (polycystic ovarian syndrome)    Perimenopausal    Pneumonia    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) 12/12/2021   Sciatica    Trigger finger of left thumb 07/08/2019   Type 2 diabetes mellitus with retinopathy, with long-term current use of insulin  (HCC) 06/01/2017    Past Surgical History:  Past Surgical History:  Procedure Laterality Date   BREAST BIOPSY Right 03/13/2023   MM RT BREAST BX W LOC DEV 1ST LESION IMAGE BX SPEC STEREO GUIDE 03/13/2023 [facility]   CHOLECYSTECTOMY  09/08/1994   CYSTOSCOPY W/ RETROGRADES Left 03/17/2024   Procedure: CYSTOSCOPY, WITH RETROGRADE PYELOGRAM;  Surgeon: Shane Steffan BROCKS, MD;  Location: [facility];  Service: Urology;  Laterality: Left;   CYSTOSCOPY W/ RETROGRADES Left 04/19/2024   Procedure: CYSTOSCOPY, WITH RETROGRADE PYELOGRAM;  Surgeon: Shane Steffan BROCKS, MD;  Location: [facility];  Service: Urology;  Laterality: Left;   CYSTOSCOPY/URETEROSCOPY/HOLMIUM  LASER/STENT PLACEMENT Left 03/17/2024   Procedure: CYSTOSCOPY/URETEROSCOPY/STENT PLACEMENT;  Surgeon: Shane Steffan BROCKS, MD;  Location: [facility];  Service: Urology;  Laterality: Left;   CYSTOSCOPY/URETEROSCOPY/HOLMIUM LASER/STENT PLACEMENT Left 04/19/2024   Procedure: CYSTOSCOPY/URETEROSCOPY/HOLMIUM LASER/STENT PLACEMENT;  Surgeon: Shane Steffan BROCKS, MD;  Location: [facility];  Service: Urology;  Laterality: Left;   DILATION AND CURETTAGE OF UTERUS     secondary to menorrhagia   EYE SURGERY  2021   Cataract  temoval   KNEE ARTHROSCOPY Left 09/08/2006   TONSILLECTOMY     as a child   WISDOM TOOTH EXTRACTION      Medication: No current facility-administered medications for this encounter.   Current Outpatient Medications  Medication Sig Dispense Refill   albuterol  (VENTOLIN  HFA) 108 (90 Base) MCG/ACT inhaler inhale 2 puffs by mouth every 4 hours if needed for cough wheezing or shortness of breath 18 g 1   Brexpiprazole  0.5 MG TABS Take 0.5 mg by mouth every evening.     esomeprazole (NEXIUM) 40 MG capsule Take 40 mg by mouth daily.     gabapentin  (NEURONTIN ) 600 MG tablet Take 1,200 mg by mouth 2 (two) times daily.  0   hyoscyamine  (ANASPAZ ) 0.125 MG TBDP disintergrating tablet Place 1 tablet (0.125 mg total) under the tongue every 6 (six) hours as needed for up to 20 doses. 20 tablet 0   insulin  glargine (LANTUS ) 100 UNIT/ML Solostar Pen Inject 64 Units into the skin daily. (Patient taking differently: Inject 70 Units into the skin at bedtime.) 15 mL 3   lamoTRIgine  (LAMICTAL ) 200 MG tablet Take 2 tablets (400 mg total) by mouth every evening. 180 tablet 1   lisdexamfetamine (VYVANSE ) 70 MG capsule Take 70 mg by mouth every morning.     lisinopril  (ZESTRIL ) 5 MG tablet TAKE 1 TABLET(5 MG) BY MOUTH DAILY 90 tablet 1   metFORMIN  (GLUCOPHAGE -XR) 500 MG 24 hr tablet Take 1 tablet (500 mg total) by mouth 2 (two) times daily with a meal. (Patient taking differently: Take 1,000 mg by mouth 2 (two) times daily with a meal.) 180 tablet 0   methocarbamol  (ROBAXIN ) 750 MG tablet Take 1 tablet (750 mg total) by mouth 4 (four) times daily for 20 doses. 20 tablet 0   Multiple Vitamins-Minerals (WOMENS MULTIVITAMIN PLUS PO) Take 1 tablet by mouth daily.      ondansetron  (ZOFRAN -ODT) 4 MG disintegrating tablet 4mg  ODT q4 hours prn nausea/vomit (Patient taking differently: Take 4 mg by mouth every 8 (eight) hours as needed for vomiting or nausea.) 10 tablet 0   oxyCODONE  (ROXICODONE ) 5 MG immediate release tablet  Take 1 tablet (5 mg total) by mouth every 6 (six) hours as needed for up to 16 doses for severe pain (pain score 7-10). 16 tablet 0   OZEMPIC , 2 MG/DOSE, 8 MG/3ML SOPN INJECT 2 MG INTO THE SKIN ONCE WEEKLY 9 mL 0   phenazopyridine  (PYRIDIUM ) 200 MG tablet Take 1 tablet (200 mg total) by mouth 3 (three) times daily as needed for up to 6 doses. 6 tablet 0   rosuvastatin  (CRESTOR ) 20 MG tablet Take 1 tablet (20 mg total) by mouth daily.     tamsulosin  (FLOMAX ) 0.4 MG CAPS capsule Take 1 capsule (0.4 mg total) by mouth daily after supper. 30 capsule 1   vortioxetine  HBr (TRINTELLIX ) 20 MG TABS tablet Take 20 mg by mouth daily.     ACCU-CHEK AVIVA PLUS test strip USE AS DIRECTED 100 each 0   ACCU-CHEK SOFTCLIX LANCETS lancets USE AS DIRECTED  100 each 1   hyoscyamine  (ANASPAZ ) 0.125 MG TBDP disintergrating tablet Place 1 tablet (0.125 mg total) under the tongue every 6 (six) hours as needed for up to 20 doses. 20 tablet 0   Insulin  Pen Needle (B-D ULTRAFINE III SHORT PEN) 31G X 8 MM MISC daily 100 each 3   ketoconazole  (NIZORAL ) 2 % cream Apply 1 application topically 2 (two) times daily. (Patient not taking: Reported on 04/21/2024) 60 g 0   oxyCODONE  (OXY IR/ROXICODONE ) 5 MG immediate release tablet Take 5 mg by mouth every 6 (six) hours as needed for severe pain (pain score 7-10).     phenazopyridine  (PYRIDIUM ) 200 MG tablet Take 1 tablet (200 mg total) by mouth 3 (three) times daily as needed for up to 6 doses. (Patient taking differently: Take 200 mg by mouth 3 (three) times daily as needed for pain.) 6 tablet 0    Allergies: Allergies  Allergen Reactions   Codeine  Nausea And Vomiting   Doxycycline     Empagliflozin -Linagliptin  Other (See Comments)    Causes yeast infection   Jardiance  [Empagliflozin ] Other (See Comments)    Causes yeast infection   Other Nausea Only    States she can't tolerate seafood smell    Social History: Social History   Tobacco Use   Smoking status: Former     Current packs/day: 0.00    Average packs/day: 1 pack/day for 25.0 years (25.0 ttl pk-yrs)    Types: Cigarettes    Start date: 06/30/1980    Quit date: 06/30/2005    Years since quitting: 18.8   Smokeless tobacco: Never   Tobacco comments:    electronic cigarettes PRN  Vaping Use   Vaping status: Never Used  Substance Use Topics   Alcohol use: No    Comment: quit: 2007   Drug use: No    Comment: occasional    Family History Family History  Problem Relation Age of Onset   Mental illness Mother        bipolar mania   Hypertension Mother    Depression Mother    Early death Mother    Lung cancer Mother    Hypertension Father    Heart disease Father 88       CAD/CABG   Arthritis Father    Liver cancer Father    Multiple sclerosis Sister    Hypertension Sister    Diabetes Sister     Review of Systems  Genitourinary:  Positive for flank pain. Negative for dysuria, frequency, hematuria and urgency.     Objective   Vital signs in last 24 hours: BP 118/67   Pulse 72   Temp 98.2 F (36.8 C) (Oral)   Resp 18   Ht 5' 5 (1.651 m)   Wt (!) 146.2 kg   LMP 03/28/2018   SpO2 92%   BMI 53.64 kg/m   Physical Exam General: A&O, resting, appropriate HEENT: [place]/AT Pulmonary: Normal work of breathing Cardiovascular: no cyanosis Abdomen: Soft, NTTP, nondistended Neuro: Appropriate, no focal neurological deficits  Most Recent Labs: Lab Results  Component Value Date   WBC 14.4 (H) 04/21/2024   HGB 12.1 04/21/2024   HCT 40.8 04/21/2024   PLT 360 04/21/2024    Lab Results  Component Value Date   NA 136 04/21/2024   K 3.9 04/21/2024   CL 104 04/21/2024   CO2 23 04/21/2024   BUN 21 (H) 04/21/2024   CREATININE 0.98 04/21/2024   CALCIUM  8.3 (L) 04/21/2024    Lab Results  Component Value Date   INR 1.07 12/10/2012   APTT 35 12/10/2012     Urine Culture: @[id number] (laburin,org,[id number])@   IMAGING: CT Renal Stone Study Result Date: 04/21/2024 EXAM: CT  ABDOMEN AND PELVIS WITHOUT CONTRAST 04/21/2024 06:46:00 AM TECHNIQUE: CT of the abdomen and pelvis was performed without the administration of intravenous contrast. Multiplanar reformatted images are provided for review. Automated exposure control, iterative reconstruction, and/or weight based adjustment of the mA/kV was utilized to reduce the radiation dose to as low as reasonably achievable. COMPARISON: 02/12/2024 CLINICAL HISTORY: Abdominal/flank pain, stone suspected. Complaints of flank pain. She notes having stent placement 2 days ago that came out tonight. FINDINGS: LOWER CHEST: 5 mm subpleural nodule abutting the major fissure is noted within the anterior right lower lobe, image 7/9. Unchanged from previous exam. Nodule within the posterior right base measures 6 mm and is also unchanged from the most recent prior exam. However this is new when compared with 11/18/2021. LIVER: No focal liver abnormality. Status post cholecystectomy. No bile duct dilatation. GALLBLADDER AND BILE DUCTS: Status post cholecystectomy. No bile duct dilatation. SPLEEN: Spleen appears normal. PANCREAS: No pancreatic mass, main duct dilatation or inflammation. ADRENAL GLANDS: Unremarkable adrenal glands. KIDNEYS, URETERS AND BLADDER: Normal appearance of the right kidney. Multiple left kidney stones are identified. The largest is in the inferior pole of the left kidney measuring 5 mm, image 40/2. There is left-sided perinephric fat stranding and moderate hydronephrosis. Left distal ureteral calculi proximal to the urinary bladder is noted measuring 3 mm. There is a small amount of gas noted within the urinary bladder. GI AND BOWEL: The appendix is visualized and appears normal. Colonic diverticulosis. No signs of bowel wall thickening, inflammation or distention. PERITONEUM AND RETROPERITONEUM: No free fluid or fluid collections. VASCULATURE: Aorta is normal in caliber. LYMPH NODES: No lymphadenopathy. REPRODUCTIVE ORGANS: No acute  abnormality. BONES AND SOFT TISSUES: No acute osseous findings. IMPRESSION: 1. Moderate left hydronephrosis, perinephric fat stranding, and hydroureter secondary to 3 mm distal left ureteral calculus. 2. A 6 mm lung nodule within the right lung base is stable from 02/12/2024 but new compared with 11/18/2021. Recommend follow up imaging in 12 months. Electronically signed by: Waddell Calk MD 04/21/2024 07:16 AM EDT RP Workstation: [id number]   DG C-Arm 1-60 Min-No Report Result Date: 04/19/2024 Fluoroscopy was utilized by the requesting physician.  No radiographic interpretation.   DG C-Arm 1-60 Min-No Report Result Date: 04/19/2024 Fluoroscopy was utilized by the requesting physician.  No radiographic interpretation.    ------  Ole Bourdon, NP Pager: [phone]   Please contact the urology consult pager with any further questions/concerns.   I have seen and examined the patient and agree with the above assessment and plan.  Pt with 3mm distal left ureteral stone. Pain is currently well controlled. No sign of infection. Discussed options and she declines stent placement. She would not want to proceed with stent unless absolutely necessary. Hopefully she will be able to pass 3mm fragment. Continue pain control, supportive care. Will let Dr. Shane know of patient's admission.  Matt R. Yoland Scherr MD [facility]  Pager: [phone]

## 2024-04-21 NOTE — H&P (Signed)
 History and Physical  Alicia Lucero FMW:[id number] DOB: [birth year] DOA: 04/21/2024  PCP: Lucius Krabbe, NP   Chief Complaint: Right-sided flank pain  HPI: Alicia Lucero is a 63 y.o. female with medical history significant for insulin -dependent type 2 diabetes, obesity, essential hypertension being admitted to the hospital for symptomatic nephrolithiasis.  She was diagnosed with a large 13 mm stone in the left renal pelvis on 7/10 and underwent laser lithotripsy with dilation and stent placement on 8/12.  Stone was noted to be hard and could not be dusted, leaving behind known roughly 2 mm fragments.  She had a tethered stent placed, this was inadvertently removed by the patient on the evening of 8/13, resulting in excruciating right-sided flank pain thereafter.  She denies any dysuria, fevers, chills, nausea, vomiting or any other infectious or complicating symptoms.  Workup in the ER is relatively benign, CT scan done today shows a left-sided 3 mm stone with some associated hydronephrosis.  She has been seen in consultation by urology, who recommends observation admission for pain control.  Review of Systems: Please see HPI for pertinent positives and negatives. A complete 10 system review of systems are otherwise negative.  Past Medical History:  Diagnosis Date   Allergy    Anxiety 1992   Arthritis 2007   Back pain    Benign essential HTN    Bipolar 1 disorder (HCC)    Cataract 2019   Chest pain    De Quervain's disease (radial styloid tenosynovitis) 07/08/2019   Depression    Diabetes mellitus without complication (HCC)    Diabetic peripheral neuropathy (HCC)    Drug use    Dyspnea    Fatty liver    Gallbladder problem    GERD (gastroesophageal reflux disease)    History of kidney stones    Hyperlipidemia    Infertility, female    Joint pain    Microalbuminuria 06/07/2021   Neuropathy    PCOS (polycystic ovarian syndrome)    Perimenopausal    Pneumonia    Retinopathy  due to secondary diabetes (HCC) 12/12/2021   Sciatica    Trigger finger of left thumb 07/08/2019   Type 2 diabetes mellitus with retinopathy, with long-term current use of insulin  (HCC) 06/01/2017   Past Surgical History:  Procedure Laterality Date   BREAST BIOPSY Right 03/13/2023   MM RT BREAST BX W LOC DEV 1ST LESION IMAGE BX SPEC STEREO GUIDE 03/13/2023 [facility]   CHOLECYSTECTOMY  09/08/1994   CYSTOSCOPY W/ RETROGRADES Left 03/17/2024   Procedure: CYSTOSCOPY, WITH RETROGRADE PYELOGRAM;  Surgeon: Shane Steffan BROCKS, MD;  Location: [facility];  Service: Urology;  Laterality: Left;   CYSTOSCOPY W/ RETROGRADES Left 04/19/2024   Procedure: CYSTOSCOPY, WITH RETROGRADE PYELOGRAM;  Surgeon: Shane Steffan BROCKS, MD;  Location: [facility];  Service: Urology;  Laterality: Left;   CYSTOSCOPY/URETEROSCOPY/HOLMIUM LASER/STENT PLACEMENT Left 03/17/2024   Procedure: CYSTOSCOPY/URETEROSCOPY/STENT PLACEMENT;  Surgeon: Shane Steffan BROCKS, MD;  Location: [facility];  Service: Urology;  Laterality: Left;   CYSTOSCOPY/URETEROSCOPY/HOLMIUM LASER/STENT PLACEMENT Left 04/19/2024   Procedure: CYSTOSCOPY/URETEROSCOPY/HOLMIUM LASER/STENT PLACEMENT;  Surgeon: Shane Steffan BROCKS, MD;  Location: [facility];  Service: Urology;  Laterality: Left;   DILATION AND CURETTAGE OF UTERUS     secondary to menorrhagia   EYE SURGERY  2021   Cataract temoval   KNEE ARTHROSCOPY Left 09/08/2006   TONSILLECTOMY     as a child   WISDOM TOOTH EXTRACTION     Social History:  reports that she quit smoking about 18  years ago. Her smoking use included cigarettes. She started smoking about 43 years ago. She has a 25 pack-year smoking history. She has never used smokeless tobacco. She reports that she does not drink alcohol and does not use drugs.  Allergies  Allergen Reactions   Codeine  Nausea And Vomiting   Doxycycline     Empagliflozin -Linagliptin  Other (See Comments)    Causes yeast infection   Jardiance  [Empagliflozin ] Other (See Comments)     Causes yeast infection   Other Nausea Only    States she can't tolerate seafood smell    Family History  Problem Relation Age of Onset   Mental illness Mother        bipolar mania   Hypertension Mother    Depression Mother    Early death Mother    Lung cancer Mother    Hypertension Father    Heart disease Father 72       CAD/CABG   Arthritis Father    Liver cancer Father    Multiple sclerosis Sister    Hypertension Sister    Diabetes Sister      Prior to Admission medications   Medication Sig Start Date End Date Taking? Authorizing Provider  albuterol  (VENTOLIN  HFA) 108 (90 Base) MCG/ACT inhaler inhale 2 puffs by mouth every 4 hours if needed for cough wheezing or shortness of breath 12/25/17  Yes Gretta Ozell CROME, PA-C  Brexpiprazole  0.5 MG TABS Take 0.5 mg by mouth every evening. 07/24/21  Yes [provider]  esomeprazole (NEXIUM) 40 MG capsule Take 40 mg by mouth daily. 11/28/19  Yes [provider]  gabapentin  (NEURONTIN ) 600 MG tablet Take 1,200 mg by mouth 2 (two) times daily. 04/21/18  Yes [provider]  hyoscyamine  (ANASPAZ ) 0.125 MG TBDP disintergrating tablet Place 1 tablet (0.125 mg total) under the tongue every 6 (six) hours as needed for up to 20 doses. 04/19/24  Yes Showalter, Steffan BROCKS, MD  insulin  glargine (LANTUS ) 100 UNIT/ML Solostar Pen Inject 64 Units into the skin daily. Patient taking differently: Inject 70 Units into the skin at bedtime. 07/03/22  Yes Lucius Krabbe, NP  lamoTRIgine  (LAMICTAL ) 200 MG tablet Take 2 tablets (400 mg total) by mouth every evening. 02/11/16  Yes Smith, Kristi M, MD  lisdexamfetamine (VYVANSE ) 70 MG capsule Take 70 mg by mouth every morning. 11/23/23  Yes [provider]  lisinopril  (ZESTRIL ) 5 MG tablet TAKE 1 TABLET(5 MG) BY MOUTH DAILY 02/02/24  Yes Hudnell, Krabbe, NP  metFORMIN  (GLUCOPHAGE -XR) 500 MG 24 hr tablet Take 1 tablet (500 mg total) by mouth 2 (two) times daily with a  meal. Patient taking differently: Take 1,000 mg by mouth 2 (two) times daily with a meal. 09/10/23  Yes Hudnell, Krabbe, NP  methocarbamol  (ROBAXIN ) 750 MG tablet Take 1 tablet (750 mg total) by mouth 4 (four) times daily for 20 doses. 04/19/24 04/24/24 Yes Showalter, Steffan BROCKS, MD  Multiple Vitamins-Minerals (WOMENS MULTIVITAMIN PLUS PO) Take 1 tablet by mouth daily.    Yes [provider]  ondansetron  (ZOFRAN -ODT) 4 MG disintegrating tablet 4mg  ODT q4 hours prn nausea/vomit Patient taking differently: Take 4 mg by mouth every 8 (eight) hours as needed for vomiting or nausea. 02/12/24  Yes Patt Alm Macho, MD  oxyCODONE  (ROXICODONE ) 5 MG immediate release tablet Take 1 tablet (5 mg total) by mouth every 6 (six) hours as needed for up to 16 doses for severe pain (pain score 7-10). 04/19/24  Yes Shane Steffan BROCKS, MD  OZEMPIC , 2 MG/DOSE, 8  MG/3ML SOPN INJECT 2 MG INTO THE SKIN ONCE WEEKLY 10/21/23  Yes Hudnell, Corean, NP  phenazopyridine  (PYRIDIUM ) 200 MG tablet Take 1 tablet (200 mg total) by mouth 3 (three) times daily as needed for up to 6 doses. 04/19/24  Yes Showalter, Steffan BROCKS, MD  rosuvastatin  (CRESTOR ) 20 MG tablet Take 1 tablet (20 mg total) by mouth daily. 10/12/23  Yes Lucius Corean, NP  tamsulosin  (FLOMAX ) 0.4 MG CAPS capsule Take 1 capsule (0.4 mg total) by mouth daily after supper. 03/17/24  Yes Showalter, Steffan BROCKS, MD  vortioxetine  HBr (TRINTELLIX ) 20 MG TABS tablet Take 20 mg by mouth daily. 11/21/23  Yes [provider]  ACCU-CHEK AVIVA PLUS test strip USE AS DIRECTED 05/07/14   Mario Million, MD  ACCU-CHEK SOFTCLIX LANCETS lancets USE AS DIRECTED    Hadassah Moats M, PA-C  hyoscyamine  (ANASPAZ ) 0.125 MG TBDP disintergrating tablet Place 1 tablet (0.125 mg total) under the tongue every 6 (six) hours as needed for up to 20 doses. 03/17/24   Shane Steffan BROCKS, MD  Insulin  Pen Needle (B-D ULTRAFINE III SHORT PEN) 31G X 8 MM MISC daily 03/12/22   Lucius Corean,  NP  ketoconazole  (NIZORAL ) 2 % cream Apply 1 application topically 2 (two) times daily. Patient not taking: Reported on 04/21/2024 07/19/15   Mario Million, MD  oxyCODONE  (OXY IR/ROXICODONE ) 5 MG immediate release tablet Take 5 mg by mouth every 6 (six) hours as needed for severe pain (pain score 7-10). 02/16/24   [provider]  phenazopyridine  (PYRIDIUM ) 200 MG tablet Take 1 tablet (200 mg total) by mouth 3 (three) times daily as needed for up to 6 doses. Patient taking differently: Take 200 mg by mouth 3 (three) times daily as needed for pain. 03/17/24   Shane Steffan BROCKS, MD    Physical Exam: BP 110/83   Pulse 68   Temp 98.2 F (36.8 C) (Oral)   Resp 18   Ht 5' 5 (1.651 m)   Wt (!) 146.2 kg   LMP 03/28/2018   SpO2 98%   BMI 53.64 kg/m  General:  Alert, oriented, calm, in no acute distress, looks comfortable, pleasant and cooperative Cardiovascular: RRR, no murmurs or rubs, no peripheral edema  Respiratory: clear to auscultation bilaterally, no wheezes, no crackles  Abdomen: soft, nontender, nondistended, normal bowel tones heard  Skin: dry, no rashes  Musculoskeletal: no joint effusions, normal range of motion  Psychiatric: appropriate affect, normal speech  Neurologic: extraocular muscles intact, clear speech, moving all extremities with intact sensorium         Labs on Admission:  Basic Metabolic Panel: Recent Labs  Lab 04/21/24 0413  NA 136  K 3.9  CL 104  CO2 23  GLUCOSE 159*  BUN 21*  CREATININE 0.98  CALCIUM  8.3*   Liver Function Tests: Recent Labs  Lab 04/21/24 0413  AST 19  ALT 20  ALKPHOS 88  BILITOT 0.4  PROT 6.4*  ALBUMIN 3.2*   No results for input(s): LIPASE, AMYLASE in the last 168 hours. No results for input(s): AMMONIA in the last 168 hours. CBC: Recent Labs  Lab 04/21/24 0413  WBC 14.4*  NEUTROABS 9.1*  HGB 12.1  HCT 40.8  MCV 91.3  PLT 360   Cardiac Enzymes: No results for input(s): CKTOTAL, CKMB,  CKMBINDEX, TROPONINI in the last 168 hours. BNP (last 3 results) No results for input(s): BNP in the last 8760 hours.  ProBNP (last 3 results) No results for input(s): PROBNP in the last 8760  hours.  CBG: Recent Labs  Lab 04/19/24 1020 04/19/24 1401  GLUCAP 152* 137*    Radiological Exams on Admission: CT Renal Stone Study Result Date: 04/21/2024 EXAM: CT ABDOMEN AND PELVIS WITHOUT CONTRAST 04/21/2024 06:46:00 AM TECHNIQUE: CT of the abdomen and pelvis was performed without the administration of intravenous contrast. Multiplanar reformatted images are provided for review. Automated exposure control, iterative reconstruction, and/or weight based adjustment of the mA/kV was utilized to reduce the radiation dose to as low as reasonably achievable. COMPARISON: 02/12/2024 CLINICAL HISTORY: Abdominal/flank pain, stone suspected. Complaints of flank pain. She notes having stent placement 2 days ago that came out tonight. FINDINGS: LOWER CHEST: 5 mm subpleural nodule abutting the major fissure is noted within the anterior right lower lobe, image 7/9. Unchanged from previous exam. Nodule within the posterior right base measures 6 mm and is also unchanged from the most recent prior exam. However this is new when compared with 11/18/2021. LIVER: No focal liver abnormality. Status post cholecystectomy. No bile duct dilatation. GALLBLADDER AND BILE DUCTS: Status post cholecystectomy. No bile duct dilatation. SPLEEN: Spleen appears normal. PANCREAS: No pancreatic mass, main duct dilatation or inflammation. ADRENAL GLANDS: Unremarkable adrenal glands. KIDNEYS, URETERS AND BLADDER: Normal appearance of the right kidney. Multiple left kidney stones are identified. The largest is in the inferior pole of the left kidney measuring 5 mm, image 40/2. There is left-sided perinephric fat stranding and moderate hydronephrosis. Left distal ureteral calculi proximal to the urinary bladder is noted measuring 3 mm. There  is a small amount of gas noted within the urinary bladder. GI AND BOWEL: The appendix is visualized and appears normal. Colonic diverticulosis. No signs of bowel wall thickening, inflammation or distention. PERITONEUM AND RETROPERITONEUM: No free fluid or fluid collections. VASCULATURE: Aorta is normal in caliber. LYMPH NODES: No lymphadenopathy. REPRODUCTIVE ORGANS: No acute abnormality. BONES AND SOFT TISSUES: No acute osseous findings. IMPRESSION: 1. Moderate left hydronephrosis, perinephric fat stranding, and hydroureter secondary to 3 mm distal left ureteral calculus. 2. A 6 mm lung nodule within the right lung base is stable from 02/12/2024 but new compared with 11/18/2021. Recommend follow up imaging in 12 months. Electronically signed by: Waddell Calk MD 04/21/2024 07:16 AM EDT RP Workstation: [id number]   DG C-Arm 1-60 Min-No Report Result Date: 04/19/2024 Fluoroscopy was utilized by the requesting physician.  No radiographic interpretation.   DG C-Arm 1-60 Min-No Report Result Date: 04/19/2024 Fluoroscopy was utilized by the requesting physician.  No radiographic interpretation.   Assessment/Plan Alicia Lucero is a 63 y.o. female with medical history significant for insulin -dependent type 2 diabetes, obesity, essential hypertension being admitted to the hospital for symptomatic nephrolithiasis.   Nephrolithiasis-with retained 3 mm ureteral fragment, after inadvertently removing her stent last night.  Patient is stable, without evidence of acute infection or other complication. -Observation admission -Pain and nausea control as needed -Scheduled Ditropan  per urology -Continue IV fluids, strain urine  Type 2 diabetes-carb modified diet, with moderate dose sliding scale insulin .  Her home basal insulin  dose was confirmed and resumed.  Neuropathy-gabapentin  twice daily  GERD-Protonix  daily  Depression, anxiety and bipolar 1 disorder-continue Trintellix , Vyvanse , Lamictal ,  Rexulti   DVT prophylaxis: Lovenox      Code Status: Full Code  Consults called: Urology  Admission status: Observation  Time spent: 16 minutes  Brazil Voytko CHRISTELLA Gail MD [facility] Pager [phone]  If 7PM-7AM, please contact night-coverage [email or web address] Password [email or web address]  04/21/2024, 10:14 AM

## 2024-04-22 ENCOUNTER — Other Ambulatory Visit (HOSPITAL_COMMUNITY): Payer: Self-pay

## 2024-04-22 DIAGNOSIS — Z794 Long term (current) use of insulin: Secondary | ICD-10-CM

## 2024-04-22 DIAGNOSIS — E1140 Type 2 diabetes mellitus with diabetic neuropathy, unspecified: Secondary | ICD-10-CM | POA: Diagnosis not present

## 2024-04-22 DIAGNOSIS — K219 Gastro-esophageal reflux disease without esophagitis: Secondary | ICD-10-CM

## 2024-04-22 DIAGNOSIS — E1142 Type 2 diabetes mellitus with diabetic polyneuropathy: Secondary | ICD-10-CM | POA: Diagnosis not present

## 2024-04-22 DIAGNOSIS — E785 Hyperlipidemia, unspecified: Secondary | ICD-10-CM

## 2024-04-22 DIAGNOSIS — F3181 Bipolar II disorder: Secondary | ICD-10-CM | POA: Diagnosis not present

## 2024-04-22 DIAGNOSIS — Z6841 Body Mass Index (BMI) 40.0 and over, adult: Secondary | ICD-10-CM

## 2024-04-22 DIAGNOSIS — E1169 Type 2 diabetes mellitus with other specified complication: Secondary | ICD-10-CM

## 2024-04-22 DIAGNOSIS — N200 Calculus of kidney: Secondary | ICD-10-CM | POA: Diagnosis not present

## 2024-04-22 DIAGNOSIS — R109 Unspecified abdominal pain: Secondary | ICD-10-CM | POA: Diagnosis not present

## 2024-04-22 DIAGNOSIS — N201 Calculus of ureter: Secondary | ICD-10-CM | POA: Diagnosis not present

## 2024-04-22 LAB — GLUCOSE, CAPILLARY
Glucose-Capillary: 95 mg/dL (ref 70–99)
Glucose-Capillary: 155 mg/dL — ABNORMAL HIGH (ref 70–99)

## 2024-04-22 LAB — BASIC METABOLIC PANEL WITH GFR
Anion gap: 8 (ref 5–15)
BUN: 20 mg/dL (ref 6–20)
CO2: 25 mmol/L (ref 22–32)
Calcium: 8.4 mg/dL — ABNORMAL LOW (ref 8.9–10.3)
Chloride: 103 mmol/L (ref 98–111)
Creatinine, Ser: 1.11 mg/dL — ABNORMAL HIGH (ref 0.44–1.00)
GFR, Estimated: 57 mL/min — ABNORMAL LOW (ref >60)
Glucose, Bld: 101 mg/dL — ABNORMAL HIGH (ref 70–99)
Potassium: 4.2 mmol/L (ref 3.5–5.1)
Sodium: 136 mmol/L (ref 135–145)

## 2024-04-22 LAB — CBC
HCT: 35.5 % — ABNORMAL LOW (ref 36.0–46.0)
Hemoglobin: 10.6 g/dL — ABNORMAL LOW (ref 12.0–15.0)
MCH: 27.2 pg (ref 26.0–34.0)
MCHC: 29.9 g/dL — ABNORMAL LOW (ref 30.0–36.0)
MCV: 91 fL (ref 80.0–100.0)
Platelets: 320 K/uL (ref 150–400)
RBC: 3.9 MIL/uL (ref 3.87–5.11)
RDW: 15 % (ref 11.5–15.5)
WBC: 11.6 K/uL — ABNORMAL HIGH (ref 4.0–10.5)
nRBC: 0 % (ref 0.0–0.2)

## 2024-04-22 LAB — HIV ANTIBODY (ROUTINE TESTING W REFLEX): HIV Screen 4th Generation wRfx: NONREACTIVE

## 2024-04-22 MED ORDER — ACETAMINOPHEN 325 MG PO TABS: 650.0000 mg | ORAL_TABLET | Freq: Four times a day (QID) | ORAL | Status: AC | PRN

## 2024-04-22 MED ORDER — TAMSULOSIN HCL 0.4 MG PO CAPS
0.4000 mg | ORAL_CAPSULE | Freq: Every day | ORAL | Status: DC
  Administered 2024-04-22: 0.4 mg via ORAL
  Filled 2024-04-22: qty 1

## 2024-04-22 MED ORDER — OXYBUTYNIN CHLORIDE 5 MG PO TABS
5.0000 mg | ORAL_TABLET | Freq: Three times a day (TID) | ORAL | 0 refills | Status: AC
  Filled 2024-04-22: qty 21, 7d supply, fill #0

## 2024-04-22 NOTE — Plan of Care (Signed)
  Problem: Education: Goal: Ability to describe self-care measures that may prevent or decrease complications (Diabetes Survival Skills Education) will improve Outcome: Progressing   Problem: Fluid Volume: Goal: Ability to maintain a balanced intake and output will improve Outcome: Progressing   Problem: Tissue Perfusion: Goal: Adequacy of tissue perfusion will improve Outcome: Progressing   Problem: Activity: Goal: Risk for activity intolerance will decrease Outcome: Progressing   Problem: Pain Managment: Goal: General experience of comfort will improve and/or be controlled Outcome: Progressing   Problem: Safety: Goal: Ability to remain free from injury will improve Outcome: Progressing

## 2024-04-22 NOTE — Progress Notes (Signed)
 Subjective: Patient doing well still having some pain, when I discussed with patient her pain was mainly in the urethra and suprapubic with urination and that is what brought her back to the hospital. She is having minimal flank pain. NO nausea.   Objective: Vital signs in last 24 hours: Temp:  [97.3 F (36.3 C)-99.6 F (37.6 C)] 99.6 F (37.6 C) (08/15 0456) Pulse Rate:  [62-85] 84 (08/15 0456) Resp:  [16-20] 16 (08/15 0456) BP: (108-134)/(54-83) 114/54 (08/15 0456) SpO2:  [93 %-100 %] 93 % (08/15 0456)  Intake/Output from previous day: 08/14 0701 - 08/15 0700 In: 260.6 [I.V.:260.6] Out: 750 [Urine:750] Intake/Output this shift: No intake/output data recorded.  Physical Exam:  General: Alert and oriented CV: RRR Lungs: Clear Abdomen: Soft, ND, ATTP GU: no foley, left flank and CVA only mildly tender to palpation .    Lab Results: Recent Labs    04/21/24 0413 04/22/24 0507  HGB 12.1 10.6*  HCT 40.8 35.5*   BMET Recent Labs    04/21/24 0413 04/22/24 0507  NA 136 136  K 3.9 4.2  CL 104 103  CO2 23 25  GLUCOSE 159* 101*  BUN 21* 20  CREATININE 0.98 1.11*  CALCIUM  8.3* 8.4*     Studies/Results: CT Renal Stone Study Result Date: 04/21/2024 EXAM: CT ABDOMEN AND PELVIS WITHOUT CONTRAST 04/21/2024 06:46:00 AM TECHNIQUE: CT of the abdomen and pelvis was performed without the administration of intravenous contrast. Multiplanar reformatted images are provided for review. Automated exposure control, iterative reconstruction, and/or weight based adjustment of the mA/kV was utilized to reduce the radiation dose to as low as reasonably achievable. COMPARISON: 02/12/2024 CLINICAL HISTORY: Abdominal/flank pain, stone suspected. Complaints of flank pain. She notes having stent placement 2 days ago that came out tonight. FINDINGS: LOWER CHEST: 5 mm subpleural nodule abutting the major fissure is noted within the anterior right lower lobe, image 7/9. Unchanged from previous exam.  Nodule within the posterior right base measures 6 mm and is also unchanged from the most recent prior exam. However this is new when compared with 11/18/2021. LIVER: No focal liver abnormality. Status post cholecystectomy. No bile duct dilatation. GALLBLADDER AND BILE DUCTS: Status post cholecystectomy. No bile duct dilatation. SPLEEN: Spleen appears normal. PANCREAS: No pancreatic mass, main duct dilatation or inflammation. ADRENAL GLANDS: Unremarkable adrenal glands. KIDNEYS, URETERS AND BLADDER: Normal appearance of the right kidney. Multiple left kidney stones are identified. The largest is in the inferior pole of the left kidney measuring 5 mm, image 40/2. There is left-sided perinephric fat stranding and moderate hydronephrosis. Left distal ureteral calculi proximal to the urinary bladder is noted measuring 3 mm. There is a small amount of gas noted within the urinary bladder. GI AND BOWEL: The appendix is visualized and appears normal. Colonic diverticulosis. No signs of bowel wall thickening, inflammation or distention. PERITONEUM AND RETROPERITONEUM: No free fluid or fluid collections. VASCULATURE: Aorta is normal in caliber. LYMPH NODES: No lymphadenopathy. REPRODUCTIVE ORGANS: No acute abnormality. BONES AND SOFT TISSUES: No acute osseous findings. IMPRESSION: 1. Moderate left hydronephrosis, perinephric fat stranding, and hydroureter secondary to 3 mm distal left ureteral calculus. 2. A 6 mm lung nodule within the right lung base is stable from 02/12/2024 but new compared with 11/18/2021. Recommend follow up imaging in 12 months. Electronically signed by: Waddell Calk MD 04/21/2024 07:16 AM EDT RP Workstation: [id number]    Assessment/Plan: 82 F w/ L renal pelvis stone s/p URS w/ LL readmitted for pain control after prematurity removing ureteral stent.  CT on 8/14 CT shows likely dust in in the distal ureter vs a small 3mm stone with some expected hydro post surgery.   After discussion with the  patient she stated she returned after having significant pain in the urethra and suprapubic are not in her left flank or kidney area. This likely indicates she had some severe post stent removal pain that generally improves after 24-48 hours post stent removal.   The patient Cr did increase slightly today to 1.11. I gave the patient two options. I offered to take the patient back to the OR later today to clean out the left ureter and replace a temporary stent or she could be discharged with f/u next week. The patient chose to attempt to pass the small fragments at home as opposed to going back to the operating room.  I gave the patient return precautions and will set up f/u with us  next week.   She has opiate pain meds at home and does not need additional pain meds at discharged   Pt can be discharged today.      LOS: 0 days   Jackey Pea MD 04/22/2024, 7:13 AM [facility]

## 2024-04-22 NOTE — Progress Notes (Signed)
 Discharge education provided to patient. AVS updated and printed. Discussed AVS with patient and went over med changes and upcoming appointments. All new information highlighted in AVS. IV removed and patient marked ready for discharge. Patient will wait in discharge lounge for husband to pick her up.

## 2024-04-22 NOTE — Discharge Summary (Signed)
 Physician Discharge Summary  Alicia Lucero FMW:[id number] DOB: 07/18/1961 DOA: 04/21/2024  PCP: Alicia Krabbe, NP  Admit date: 04/21/2024 Discharge date: 04/22/2024  Time spent: 60 minutes  Recommendations for Outpatient Follow-up:  Follow-up with Dr. Shane, urology in 1 week.   Discharge Diagnoses:  Principal Problem:   Nephrolithiasis Active Problems:   Morbid obesity with BMI of 50.0-59.9, adult (HCC)   Type 2 diabetes mellitus with diabetic neuropathy, with long-term current use of insulin  (HCC)   Dyslipidemia associated with type 2 diabetes mellitus (HCC)   Bipolar 2 disorder (HCC)   Gastroesophageal reflux disease without esophagitis   Diabetic peripheral neuropathy (HCC)   Discharge Condition: Stable and improved.  Diet recommendation: Carb modified diet  Filed Weights   04/21/24 0353  Weight: (!) 146.2 kg    History of present illness:  HPI per Dr. Zella Lucero Alicia Lucero is a 63 y.o. female with medical history significant for insulin -dependent type 2 diabetes, obesity, essential hypertension being admitted to the hospital for symptomatic nephrolithiasis.  She was diagnosed with a large 13 mm stone in the left renal pelvis on 7/10 and underwent laser lithotripsy with dilation and stent placement on 8/12.  Stone was noted to be hard and could not be dusted, leaving behind known roughly 2 mm fragments.  She had a tethered stent placed, this was inadvertently removed by the patient on the evening of 8/13, resulting in excruciating right-sided flank pain thereafter.  She denies any dysuria, fevers, chills, nausea, vomiting or any other infectious or complicating symptoms.  Workup in the ER is relatively benign, CT scan done today shows a left-sided 3 mm stone with some associated hydronephrosis.  She has been seen in consultation by urology, who recommends observation admission for pain control.   Hospital Course:  #1 nephrolithiasis - Patient with retained 3 mm  ureteral fragment, after inadvertently removing his stent the night prior to admission. - Patient with no acute evidence of infection or other complication. - CT renal stone protocol done on admission with moderate left hydronephrosis, perinephric fat stranding hydro ureter secondary to 3 mm distal left ureteral calculus. - Patient subsequently started on scheduled Ditropan  per urology recommendations. - Patient seen in consultation by urology who assessed patient and felt patient's pain in the urethra and suprapubic region on admission likely due to severe post stent removal pain that generally improves after 24 to 48 hours post stent removal. - Patient noted to have a bump in her creatinine to 1.11 on day of discharge and urology gave patient 2 options which included returning back to the OR to clean up the left ureter and replace the temporary stent or patient may be discharged with close outpatient follow-up in 1 week.  Patient chose to attempt to pass the small fragments at home as opposed to going back to the operating room. - Patient subsequently cleared by urology for discharge. - Patient be discharged in stable and improved condition with outpatient follow-up with urology.  2.  Diabetes mellitus type 2 -Patient was placed back on home regiment basal insulin  as well as sliding scale insulin .  3.  Neuropathy -Patient placed back on home regimen gabapentin  twice daily.  4.  GERD -Patient maintained on PPI.  5.  Bipolar disorder/depression/anxiety -Patient maintained on home regimen Trintellix , Vyvanse , Lamictal , Rexulti .  6.  Morbid obesity class III -Patient with a BMI of 53.64 kg/m. - Lifestyle modification. - Outpatient follow-up with PCP.  Procedures: CT renal stone protocol 04/21/2024  Consultations: Urology: Dr.  Gay 04/21/2024  Discharge Exam: Vitals:   04/22/24 0456 04/22/24 1103  BP: (!) 114/54 (!) 114/54  Pulse: 84   Resp: 16   Temp: 99.6 F (37.6 C)   SpO2: 93%      General: NAD Cardiovascular: RRR no murmurs rubs or gallops.  No JVD.  No pitting lower extremity edema. Respiratory: Clear to auscultation bilaterally.  No wheezes, no crackles, no rhonchi.  Fair air movement.  Speaking in full sentences.  Discharge Instructions   Discharge Instructions     Diet Carb Modified   Complete by: As directed    Increase activity slowly   Complete by: As directed       Allergies as of 04/22/2024       Reactions   Codeine  Nausea And Vomiting   Doxycycline     Empagliflozin -linagliptin  Other (See Comments)   Causes yeast infection   Jardiance  [empagliflozin ] Other (See Comments)   Causes yeast infection   Other Nausea Only   States she can't tolerate seafood smell        Medication List     STOP taking these medications    ketoconazole  2 % cream Commonly known as: NIZORAL        TAKE these medications    Accu-Chek Aviva Plus test strip Generic drug: glucose blood USE AS DIRECTED   Accu-Chek Softclix Lancets lancets USE AS DIRECTED   acetaminophen  325 MG tablet Commonly known as: TYLENOL  Take 2 tablets (650 mg total) by mouth every 6 (six) hours as needed for mild pain (pain score 1-3) or fever (or Fever >/= 101).   albuterol  108 (90 Base) MCG/ACT inhaler Commonly known as: Ventolin  HFA inhale 2 puffs by mouth every 4 hours if needed for cough wheezing or shortness of breath   B-D ULTRAFINE III SHORT PEN 31G X 8 MM Misc Generic drug: Insulin  Pen Needle daily   Brexpiprazole  0.5 MG Tabs Take 0.5 mg by mouth every evening.   esomeprazole 40 MG capsule Commonly known as: NEXIUM Take 40 mg by mouth daily.   gabapentin  600 MG tablet Commonly known as: NEURONTIN  Take 1,200 mg by mouth 2 (two) times daily.   hyoscyamine  0.125 MG Tbdp disintergrating tablet Commonly known as: ANASPAZ  Place 1 tablet (0.125 mg total) under the tongue every 6 (six) hours as needed for up to 20 doses. What changed: Another medication with  the same name was removed. Continue taking this medication, and follow the directions you see here.   insulin  glargine 100 UNIT/ML Solostar Pen Commonly known as: LANTUS  Inject 64 Units into the skin daily. What changed:  how much to take when to take this   lamoTRIgine  200 MG tablet Commonly known as: LAMICTAL  Take 2 tablets (400 mg total) by mouth every evening.   lisdexamfetamine 70 MG capsule Commonly known as: VYVANSE  Take 70 mg by mouth every morning.   lisinopril  5 MG tablet Commonly known as: ZESTRIL  TAKE 1 TABLET(5 MG) BY MOUTH DAILY   metFORMIN  500 MG 24 hr tablet Commonly known as: GLUCOPHAGE -XR Take 1 tablet (500 mg total) by mouth 2 (two) times daily with a meal. What changed: how much to take   methocarbamol  750 MG tablet Commonly known as: ROBAXIN  Take 1 tablet (750 mg total) by mouth 4 (four) times daily for 20 doses.   ondansetron  4 MG disintegrating tablet Commonly known as: ZOFRAN -ODT 4mg  ODT q4 hours prn nausea/vomit What changed:  how much to take how to take this when to take this reasons to take this  additional instructions   oxybutynin  5 MG tablet Commonly known as: DITROPAN  Take 1 tablet (5 mg total) by mouth 3 (three) times daily for 7 days.   oxyCODONE  5 MG immediate release tablet Commonly known as: Roxicodone  Take 1 tablet (5 mg total) by mouth every 6 (six) hours as needed for up to 16 doses for severe pain (pain score 7-10). What changed: Another medication with the same name was removed. Continue taking this medication, and follow the directions you see here.   Ozempic  (2 MG/DOSE) 8 MG/3ML Sopn Generic drug: Semaglutide  (2 MG/DOSE) INJECT 2 MG INTO THE SKIN ONCE WEEKLY   phenazopyridine  200 MG tablet Commonly known as: Pyridium  Take 1 tablet (200 mg total) by mouth 3 (three) times daily as needed for up to 6 doses. What changed: reasons to take this   phenazopyridine  200 MG tablet Commonly known as: Pyridium  Take 1 tablet  (200 mg total) by mouth 3 (three) times daily as needed for up to 6 doses. What changed: Another medication with the same name was changed. Make sure you understand how and when to take each.   rosuvastatin  20 MG tablet Commonly known as: CRESTOR  Take 1 tablet (20 mg total) by mouth daily.   tamsulosin  0.4 MG Caps capsule Commonly known as: FLOMAX  Take 1 capsule (0.4 mg total) by mouth daily after supper.   vortioxetine  HBr 20 MG Tabs tablet Commonly known as: TRINTELLIX  Take 20 mg by mouth daily.   WOMENS MULTIVITAMIN PLUS PO Take 1 tablet by mouth daily.       Allergies  Allergen Reactions   Codeine  Nausea And Vomiting   Doxycycline     Empagliflozin -Linagliptin  Other (See Comments)    Causes yeast infection   Jardiance  [Empagliflozin ] Other (See Comments)    Causes yeast infection   Other Nausea Only    States she can't tolerate seafood smell    Follow-up Information     Alicia Lucero Steffan BROCKS, MD. Schedule an appointment as soon as possible for a visit in 1 week(s).   Specialty: Urology Why: Follow-up as scheduled in 1 week. Contact information: [address] [place] [address] [phone]                  The results of significant diagnostics from this hospitalization (including imaging, microbiology, ancillary and laboratory) are listed below for reference.    Significant Diagnostic Studies: CT Renal Stone Study Result Date: 04/21/2024 EXAM: CT ABDOMEN AND PELVIS WITHOUT CONTRAST 04/21/2024 06:46:00 AM TECHNIQUE: CT of the abdomen and pelvis was performed without the administration of intravenous contrast. Multiplanar reformatted images are provided for review. Automated exposure control, iterative reconstruction, and/or weight based adjustment of the mA/kV was utilized to reduce the radiation dose to as low as reasonably achievable. COMPARISON: 02/12/2024 CLINICAL HISTORY: Abdominal/flank pain, stone suspected. Complaints of flank pain. She  notes having stent placement 2 days ago that came out tonight. FINDINGS: LOWER CHEST: 5 mm subpleural nodule abutting the major fissure is noted within the anterior right lower lobe, image 7/9. Unchanged from previous exam. Nodule within the posterior right base measures 6 mm and is also unchanged from the most recent prior exam. However this is new when compared with 11/18/2021. LIVER: No focal liver abnormality. Status post cholecystectomy. No bile duct dilatation. GALLBLADDER AND BILE DUCTS: Status post cholecystectomy. No bile duct dilatation. SPLEEN: Spleen appears normal. PANCREAS: No pancreatic mass, main duct dilatation or inflammation. ADRENAL GLANDS: Unremarkable adrenal glands. KIDNEYS, URETERS AND BLADDER: Normal appearance  of the right kidney. Multiple left kidney stones are identified. The largest is in the inferior pole of the left kidney measuring 5 mm, image 40/2. There is left-sided perinephric fat stranding and moderate hydronephrosis. Left distal ureteral calculi proximal to the urinary bladder is noted measuring 3 mm. There is a small amount of gas noted within the urinary bladder. GI AND BOWEL: The appendix is visualized and appears normal. Colonic diverticulosis. No signs of bowel wall thickening, inflammation or distention. PERITONEUM AND RETROPERITONEUM: No free fluid or fluid collections. VASCULATURE: Aorta is normal in caliber. LYMPH NODES: No lymphadenopathy. REPRODUCTIVE ORGANS: No acute abnormality. BONES AND SOFT TISSUES: No acute osseous findings. IMPRESSION: 1. Moderate left hydronephrosis, perinephric fat stranding, and hydroureter secondary to 3 mm distal left ureteral calculus. 2. A 6 mm lung nodule within the right lung base is stable from 02/12/2024 but new compared with 11/18/2021. Recommend follow up imaging in 12 months. Electronically signed by: Waddell Calk MD 04/21/2024 07:16 AM EDT RP Workstation: [id number]   DG C-Arm 1-60 Min-No Report Result Date:  04/19/2024 Fluoroscopy was utilized by the requesting physician.  No radiographic interpretation.   DG C-Arm 1-60 Min-No Report Result Date: 04/19/2024 Fluoroscopy was utilized by the requesting physician.  No radiographic interpretation.    Microbiology: No results found for this or any previous visit (from the past 240 hours).   Labs: Basic Metabolic Panel: Recent Labs  Lab 04/21/24 0413 04/22/24 0507  NA 136 136  K 3.9 4.2  CL 104 103  CO2 23 25  GLUCOSE 159* 101*  BUN 21* 20  CREATININE 0.98 1.11*  CALCIUM  8.3* 8.4*   Liver Function Tests: Recent Labs  Lab 04/21/24 0413  AST 19  ALT 20  ALKPHOS 88  BILITOT 0.4  PROT 6.4*  ALBUMIN 3.2*   No results for input(s): LIPASE, AMYLASE in the last 168 hours. No results for input(s): AMMONIA in the last 168 hours. CBC: Recent Labs  Lab 04/21/24 0413 04/22/24 0507  WBC 14.4* 11.6*  NEUTROABS 9.1*  --   HGB 12.1 10.6*  HCT 40.8 35.5*  MCV 91.3 91.0  PLT 360 320   Cardiac Enzymes: No results for input(s): CKTOTAL, CKMB, CKMBINDEX, TROPONINI in the last 168 hours. BNP: BNP (last 3 results) No results for input(s): BNP in the last 8760 hours.  ProBNP (last 3 results) No results for input(s): PROBNP in the last 8760 hours.  CBG: Recent Labs  Lab 04/21/24 1207 04/21/24 1650 04/21/24 2109 04/22/24 0816 04/22/24 1141  GLUCAP 87 109* 131* 95 155*       Signed:  Toribio Hummer MD.  [facility] 04/22/2024, 4:27 PM

## 2024-04-27 ENCOUNTER — Ambulatory Visit: Admitting: Family

## 2024-04-28 ENCOUNTER — Encounter: Payer: Self-pay | Admitting: Family

## 2024-04-28 ENCOUNTER — Ambulatory Visit (INDEPENDENT_AMBULATORY_CARE_PROVIDER_SITE_OTHER): Admitting: Family

## 2024-04-28 VITALS — BP 115/68 | HR 84 | Temp 98.1°F | Ht 65.0 in | Wt 322.0 lb

## 2024-04-28 DIAGNOSIS — R911 Solitary pulmonary nodule: Secondary | ICD-10-CM | POA: Diagnosis not present

## 2024-04-28 DIAGNOSIS — B369 Superficial mycosis, unspecified: Secondary | ICD-10-CM | POA: Diagnosis not present

## 2024-04-28 DIAGNOSIS — D72829 Elevated white blood cell count, unspecified: Secondary | ICD-10-CM

## 2024-04-28 MED ORDER — FLUCONAZOLE 150 MG PO TABS: ORAL_TABLET | ORAL | 0 refills | Status: DC

## 2024-04-28 MED ORDER — TERCONAZOLE 0.8 % VA CREA: 1.0000 | TOPICAL_CREAM | Freq: Every day | VAGINAL | 2 refills | Status: DC

## 2024-04-28 NOTE — Progress Notes (Unsigned)
 Patient ID: Alicia Lucero, female    DOB: 05-31-1961, 63 y.o.   MRN: [id number]  Chief Complaint  Patient presents with  . Rash    Pt c/o rash below abdomen, present for 2 weeks. Has tried nivea cream and worsening. Rash is dry, red and itchy.   Discussed the use of AI scribe software for clinical note transcription with the patient, who gave verbal consent to proceed.  History of Present Illness   Alicia Lucero is a 63 year old female who presents with a persistent rash and concerns about a lung nodule.  Cutaneous rash - Persistent rash located on upper thighs and under panniculus - Rash is dry and atypical for a yeast infection - Nystatin  and terconazole  creams used; terconazole  more effective - Nivea cream provides relief for dryness - Unable to use Diflucan  due to interaction with Rexulti   Pulmonary nodule - 6mm lung nodule identified in June 2025, not present in 2023 imaging - Family history of lung cancer - History of significant tobacco use; quit smoking in 2006  Constitutional symptoms - Persistent fatigue and malaise since kidney stone sx started in March - Weight loss present with Ozempic  - A1c has improved - History of elevated white blood cell count with no identified cause  Genitourinary symptoms - No current urinary symptoms     Assessment and Plan    Fungal rash of upper thighs and pannicula Due to moisture and irritation from skin rubbing. Previous effective treatment with terconazole  cream. Agreed to short-term Fluconazole  despite interaction with Rexulti  reducing Rexulti  effectiveness. - Prescribe terconazole  cream with refills. - Advise antifungal cream followed by Nivea cream for dryness. - Prescribe Fluconazole  150mg  x2 doses. - Keep skin as dry as possible - Advised to call back if no improvement in rash  Solitary pulmonary nodule, right lung, 6 mm 6 mm nodule in right lung, incidental finding on CT. Family history of lung cancer and former smoker.  No prior targeted lung scans. - Refer to pulmonologist for evaluation and monitoring.  Persistent elevated white blood cell count Persistent leukocytosis without neutrophilia, except for recently d/t UTI. No current infection symptoms. Elevated count for years without clear cause. Stopped smoking in 2006. - Refer to hematology for evaluation of leukocytosis. - Review potential causes including infection, inflammation, and hematological conditions.  Nephrolithiasis, status post recent bilateral stone removal Recent bilateral stone removal with complications. Stent placement and removal. Previous UTI treated with Bactrim. Reports still feeling effects, slowly recovering.     Subjective:    Outpatient Medications Prior to Visit  Medication Sig Dispense Refill  . ACCU-CHEK AVIVA PLUS test strip USE AS DIRECTED 100 each 0  . ACCU-CHEK SOFTCLIX LANCETS lancets USE AS DIRECTED 100 each 1  . Brexpiprazole  0.5 MG TABS Take 0.5 mg by mouth every evening.    SABRA esomeprazole (NEXIUM) 40 MG capsule Take 40 mg by mouth daily.    . gabapentin  (NEURONTIN ) 600 MG tablet Take 1,200 mg by mouth 2 (two) times daily.  0  . hyoscyamine  (ANASPAZ ) 0.125 MG TBDP disintergrating tablet Place 1 tablet (0.125 mg total) under the tongue every 6 (six) hours as needed for up to 20 doses. 20 tablet 0  . insulin  glargine (LANTUS ) 100 UNIT/ML Solostar Pen Inject 64 Units into the skin daily. 15 mL 3  . Insulin  Pen Needle (B-D ULTRAFINE III SHORT PEN) 31G X 8 MM MISC daily 100 each 3  . lamoTRIgine  (LAMICTAL ) 200 MG tablet Take 2 tablets (400 mg  total) by mouth every evening. 180 tablet 1  . lisdexamfetamine (VYVANSE ) 70 MG capsule Take 70 mg by mouth every morning.    . lisinopril  (ZESTRIL ) 5 MG tablet TAKE 1 TABLET(5 MG) BY MOUTH DAILY 90 tablet 1  . metFORMIN  (GLUCOPHAGE -XR) 500 MG 24 hr tablet Take 1 tablet (500 mg total) by mouth 2 (two) times daily with a meal. 180 tablet 0  . Multiple Vitamins-Minerals (WOMENS  MULTIVITAMIN PLUS PO) Take 1 tablet by mouth daily.     . ondansetron  (ZOFRAN -ODT) 4 MG disintegrating tablet 4mg  ODT q4 hours prn nausea/vomit (Patient taking differently: Take 4 mg by mouth every 8 (eight) hours as needed for vomiting or nausea.) 10 tablet 0  . OZEMPIC , 2 MG/DOSE, 8 MG/3ML SOPN INJECT 2 MG INTO THE SKIN ONCE WEEKLY 9 mL 0  . rosuvastatin  (CRESTOR ) 20 MG tablet Take 1 tablet (20 mg total) by mouth daily.    . tamsulosin  (FLOMAX ) 0.4 MG CAPS capsule Take 1 capsule (0.4 mg total) by mouth daily after supper. 30 capsule 1  . vortioxetine  HBr (TRINTELLIX ) 20 MG TABS tablet Take 20 mg by mouth daily.    . acetaminophen  (TYLENOL ) 325 MG tablet Take 2 tablets (650 mg total) by mouth every 6 (six) hours as needed for mild pain (pain score 1-3) or fever (or Fever >/= 101). (Patient not taking: Reported on 04/28/2024)    . albuterol  (VENTOLIN  HFA) 108 (90 Base) MCG/ACT inhaler inhale 2 puffs by mouth every 4 hours if needed for cough wheezing or shortness of breath 18 g 1  . oxybutynin  (DITROPAN ) 5 MG tablet Take 1 tablet (5 mg total) by mouth 3 (three) times daily for 7 days. (Patient not taking: Reported on 04/28/2024) 21 tablet 0  . oxyCODONE  (ROXICODONE ) 5 MG immediate release tablet Take 1 tablet (5 mg total) by mouth every 6 (six) hours as needed for up to 16 doses for severe pain (pain score 7-10). (Patient not taking: Reported on 04/28/2024) 16 tablet 0  . phenazopyridine  (PYRIDIUM ) 200 MG tablet Take 1 tablet (200 mg total) by mouth 3 (three) times daily as needed for up to 6 doses. (Patient taking differently: Take 200 mg by mouth 3 (three) times daily as needed for pain.) 6 tablet 0  . phenazopyridine  (PYRIDIUM ) 200 MG tablet Take 1 tablet (200 mg total) by mouth 3 (three) times daily as needed for up to 6 doses. 6 tablet 0   No facility-administered medications prior to visit.   Past Medical History:  Diagnosis Date  . Allergy   . Anxiety 1992  . Arthritis 2007  . Back pain   .  Benign essential HTN   . Bipolar 1 disorder (HCC)   . Cataract 2019  . Chest pain   . De Quervain's disease (radial styloid tenosynovitis) 07/08/2019  . Depression   . Diabetes mellitus without complication (HCC)   . Diabetic peripheral neuropathy (HCC)   . Drug use   . Dyspnea   . Fatty liver   . Gallbladder problem   . GERD (gastroesophageal reflux disease)   . History of kidney stones   . Hyperlipidemia   . Infertility, female   . Joint pain   . Microalbuminuria 06/07/2021  . Neuropathy   . PCOS (polycystic ovarian syndrome)   . Perimenopausal   . Pneumonia   . 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) 12/12/2021  . Sciatica   . Trigger finger of left thumb 07/08/2019  . Type 2 diabetes mellitus with retinopathy, with  long-term current use of insulin  (HCC) 06/01/2017   Past Surgical History:  Procedure Laterality Date  . BREAST BIOPSY Right 03/13/2023   MM RT BREAST BX W LOC DEV 1ST LESION IMAGE BX SPEC STEREO GUIDE 03/13/2023 [facility]  . CHOLECYSTECTOMY  09/08/1994  . CYSTOSCOPY W/ RETROGRADES Left 03/17/2024   Procedure: CYSTOSCOPY, WITH RETROGRADE PYELOGRAM;  Surgeon: Shane Steffan BROCKS, MD;  Location: [facility];  Service: Urology;  Laterality: Left;  . CYSTOSCOPY W/ RETROGRADES Left 04/19/2024   Procedure: CYSTOSCOPY, WITH RETROGRADE PYELOGRAM;  Surgeon: Shane Steffan BROCKS, MD;  Location: [facility];  Service: Urology;  Laterality: Left;  . CYSTOSCOPY/URETEROSCOPY/HOLMIUM LASER/STENT PLACEMENT Left 03/17/2024   Procedure: CYSTOSCOPY/URETEROSCOPY/STENT PLACEMENT;  Surgeon: Shane Steffan BROCKS, MD;  Location: [facility];  Service: Urology;  Laterality: Left;  . CYSTOSCOPY/URETEROSCOPY/HOLMIUM LASER/STENT PLACEMENT Left 04/19/2024   Procedure: CYSTOSCOPY/URETEROSCOPY/HOLMIUM LASER/STENT PLACEMENT;  Surgeon: Shane Steffan BROCKS, MD;  Location: [facility];  Service: Urology;  Laterality: Left;  . DILATION AND CURETTAGE OF UTERUS     secondary to menorrhagia  . EYE SURGERY  2021    Cataract temoval  . KNEE ARTHROSCOPY Left 09/08/2006  . TONSILLECTOMY     as a child  . WISDOM TOOTH EXTRACTION     Allergies  Allergen Reactions  . Codeine  Nausea And Vomiting  . Doxycycline    . Empagliflozin -Linagliptin  Other (See Comments)    Causes yeast infection  . Jardiance  [Empagliflozin ] Other (See Comments)    Causes yeast infection  . Other Nausea Only    States she can't tolerate seafood smell      Objective:    Physical Exam Vitals and nursing note reviewed.  Constitutional:      Appearance: Normal appearance. She is obese.  Cardiovascular:     Rate and Rhythm: Normal rate and regular rhythm.  Pulmonary:     Effort: Pulmonary effort is normal.     Breath sounds: Normal breath sounds.  Musculoskeletal:        General: Normal range of motion.  Skin:    General: Skin is warm and dry.  Neurological:     Mental Status: She is alert.  Psychiatric:        Mood and Affect: Mood normal.        Behavior: Behavior normal.    BP 115/68 (BP Location: Left Arm, Patient Position: Sitting, Cuff Size: Large)   Pulse 84   Temp 98.1 F (36.7 C) (Temporal)   Ht 5' 5 (1.651 m)   LMP 03/28/2018   SpO2 96%   BMI 53.64 kg/m  Wt Readings from Last 3 Encounters:  04/21/24 (!) 322 lb 5 oz (146.2 kg)  04/19/24 (!) 323 lb (146.5 kg)  04/12/24 (!) 323 lb (146.5 kg)   *I personally spent a total of 35 minutes in the care of the patient today including getting/reviewing separately obtained history, performing a medically appropriate exam/evaluation, counseling and educating, placing orders, referring and communicating with other health care professionals, and documenting clinical information in the EHR.    Lucius Krabbe, NP

## 2024-04-29 DIAGNOSIS — D72829 Elevated white blood cell count, unspecified: Secondary | ICD-10-CM | POA: Insufficient documentation

## 2024-04-29 DIAGNOSIS — R911 Solitary pulmonary nodule: Secondary | ICD-10-CM | POA: Insufficient documentation

## 2024-04-29 LAB — STONE ANALYSIS
Calcium Oxalate Dihydrate: 10 %
Calcium Oxalate Monohydrate: 90 %
Size Calculi: <1 mm
Weight Calculi: <1 mg

## 2024-04-29 NOTE — Assessment & Plan Note (Signed)
 Persistent leukocytosis without neutrophilia, except for recently d/t UTI. No current infection symptoms. Elevated count for years without clear cause. Stopped smoking in 2006. - Refer to hematology for evaluation of leukocytosis. - Review potential causes including infection, inflammation, and hematological conditions.

## 2024-04-29 NOTE — Assessment & Plan Note (Signed)
 6 mm nodule in right lung, incidental finding on CT. Family history of lung cancer and former smoker. No prior targeted lung scans. - Refer to pulmonologist for evaluation and monitoring.

## 2024-05-02 DIAGNOSIS — N200 Calculus of kidney: Secondary | ICD-10-CM | POA: Diagnosis not present

## 2024-05-10 DIAGNOSIS — N201 Calculus of ureter: Secondary | ICD-10-CM | POA: Diagnosis not present

## 2024-05-11 ENCOUNTER — Encounter: Payer: Self-pay | Admitting: Family

## 2024-05-11 DIAGNOSIS — B369 Superficial mycosis, unspecified: Secondary | ICD-10-CM

## 2024-05-12 DIAGNOSIS — F3132 Bipolar disorder, current episode depressed, moderate: Secondary | ICD-10-CM | POA: Diagnosis not present

## 2024-05-12 MED ORDER — CLOTRIMAZOLE 1 % EX CREA: 1.0000 | TOPICAL_CREAM | Freq: Two times a day (BID) | CUTANEOUS | 2 refills | Status: AC

## 2024-05-12 MED ORDER — NYSTATIN 100000 UNIT/GM EX POWD
1.0000 | Freq: Three times a day (TID) | CUTANEOUS | 2 refills | Status: AC
Start: 2024-05-12 — End: ?

## 2024-05-12 NOTE — Telephone Encounter (Signed)
 sending clotrimazole  cream to use instead. must dry skin well, apply cream twice day. Will also send an antifungal powder to apply lightly over the cream. thx

## 2024-05-16 DIAGNOSIS — F3181 Bipolar II disorder: Secondary | ICD-10-CM | POA: Diagnosis not present

## 2024-05-18 DIAGNOSIS — F3132 Bipolar disorder, current episode depressed, moderate: Secondary | ICD-10-CM | POA: Diagnosis not present

## 2024-05-24 ENCOUNTER — Telehealth: Payer: Self-pay | Admitting: *Deleted

## 2024-05-24 DIAGNOSIS — E1159 Type 2 diabetes mellitus with other circulatory complications: Secondary | ICD-10-CM

## 2024-05-24 NOTE — Progress Notes (Signed)
 Complex Care Management Note Care Guide Note  05/24/2024 Name: Alicia Lucero MRN: [id number] DOB: 1961/06/01   Complex Care Management Outreach Attempts: An unsuccessful telephone outreach was attempted today to offer the patient information about available complex care management services.  Follow Up Plan:  Additional outreach attempts will be made to offer the patient complex care management information and services.   Encounter Outcome:  No Answer  Thedford Franks, CMA [place]  [facility], [facility] Guide Direct Dial: [phone]  Fax: [phone] Website: [email or web address]

## 2024-05-25 DIAGNOSIS — F3132 Bipolar disorder, current episode depressed, moderate: Secondary | ICD-10-CM | POA: Diagnosis not present

## 2024-05-25 NOTE — Progress Notes (Signed)
 Complex Care Management Note Care Guide Note  05/25/2024 Name: Alicia Lucero MRN: [id number] DOB: Jun 12, 1961   Complex Care Management Outreach Attempts: A second unsuccessful outreach was attempted today to offer the patient with information about available complex care management services.  Follow Up Plan:  Additional outreach attempts will be made to offer the patient complex care management information and services.   Encounter Outcome:  pt requested call back in 2 weeks - death in the family   Thedford Franks, CMA [place]  [facility], [facility] Guide Direct Dial: [phone]  Fax: [phone] Website: [email or web address]

## 2024-06-01 ENCOUNTER — Ambulatory Visit

## 2024-06-01 DIAGNOSIS — F3132 Bipolar disorder, current episode depressed, moderate: Secondary | ICD-10-CM | POA: Diagnosis not present

## 2024-06-03 ENCOUNTER — Encounter: Payer: Self-pay | Admitting: Oncology

## 2024-06-03 ENCOUNTER — Inpatient Hospital Stay

## 2024-06-03 ENCOUNTER — Inpatient Hospital Stay: Attending: Oncology | Admitting: Oncology

## 2024-06-03 ENCOUNTER — Telehealth: Payer: Self-pay | Admitting: Oncology

## 2024-06-03 VITALS — BP 139/80 | HR 103 | Temp 97.6°F | Resp 20 | Ht 65.0 in | Wt 329.3 lb

## 2024-06-03 DIAGNOSIS — Z87442 Personal history of urinary calculi: Secondary | ICD-10-CM | POA: Diagnosis not present

## 2024-06-03 DIAGNOSIS — N200 Calculus of kidney: Secondary | ICD-10-CM | POA: Diagnosis not present

## 2024-06-03 DIAGNOSIS — Z885 Allergy status to narcotic agent status: Secondary | ICD-10-CM | POA: Insufficient documentation

## 2024-06-03 DIAGNOSIS — E785 Hyperlipidemia, unspecified: Secondary | ICD-10-CM | POA: Insufficient documentation

## 2024-06-03 DIAGNOSIS — Z9049 Acquired absence of other specified parts of digestive tract: Secondary | ICD-10-CM | POA: Diagnosis not present

## 2024-06-03 DIAGNOSIS — Z818 Family history of other mental and behavioral disorders: Secondary | ICD-10-CM | POA: Diagnosis not present

## 2024-06-03 DIAGNOSIS — Z79899 Other long term (current) drug therapy: Secondary | ICD-10-CM | POA: Diagnosis not present

## 2024-06-03 DIAGNOSIS — E1142 Type 2 diabetes mellitus with diabetic polyneuropathy: Secondary | ICD-10-CM | POA: Insufficient documentation

## 2024-06-03 DIAGNOSIS — Z8249 Family history of ischemic heart disease and other diseases of the circulatory system: Secondary | ICD-10-CM | POA: Insufficient documentation

## 2024-06-03 DIAGNOSIS — Z833 Family history of diabetes mellitus: Secondary | ICD-10-CM | POA: Insufficient documentation

## 2024-06-03 DIAGNOSIS — D72829 Elevated white blood cell count, unspecified: Secondary | ICD-10-CM | POA: Diagnosis not present

## 2024-06-03 DIAGNOSIS — Z801 Family history of malignant neoplasm of trachea, bronchus and lung: Secondary | ICD-10-CM | POA: Insufficient documentation

## 2024-06-03 DIAGNOSIS — D649 Anemia, unspecified: Secondary | ICD-10-CM | POA: Diagnosis not present

## 2024-06-03 DIAGNOSIS — Z87891 Personal history of nicotine dependence: Secondary | ICD-10-CM | POA: Diagnosis not present

## 2024-06-03 DIAGNOSIS — K760 Fatty (change of) liver, not elsewhere classified: Secondary | ICD-10-CM | POA: Diagnosis not present

## 2024-06-03 DIAGNOSIS — Z8701 Personal history of pneumonia (recurrent): Secondary | ICD-10-CM | POA: Diagnosis not present

## 2024-06-03 DIAGNOSIS — Z881 Allergy status to other antibiotic agents status: Secondary | ICD-10-CM | POA: Insufficient documentation

## 2024-06-03 DIAGNOSIS — Z820 Family history of epilepsy and other diseases of the nervous system: Secondary | ICD-10-CM | POA: Diagnosis not present

## 2024-06-03 DIAGNOSIS — Z8261 Family history of arthritis: Secondary | ICD-10-CM | POA: Insufficient documentation

## 2024-06-03 DIAGNOSIS — Z9089 Acquired absence of other organs: Secondary | ICD-10-CM | POA: Insufficient documentation

## 2024-06-03 DIAGNOSIS — Z800 Family history of malignant neoplasm of digestive organs: Secondary | ICD-10-CM | POA: Insufficient documentation

## 2024-06-03 DIAGNOSIS — I10 Essential (primary) hypertension: Secondary | ICD-10-CM | POA: Diagnosis not present

## 2024-06-03 DIAGNOSIS — E669 Obesity, unspecified: Secondary | ICD-10-CM | POA: Diagnosis not present

## 2024-06-03 NOTE — Assessment & Plan Note (Signed)
 Chronic mild leukocytosis with white blood cell counts ranging from 11,500 to 12,000, peaking at 19,000 during a kidney stone episode in June 2025. Differential includes infection, inflammation, stress, bone marrow disorders, and medication effects. No evidence of acute bone marrow pathology or concerning differential counts. Likely benign leukocytosis, requiring no intervention but monitoring.  We will plan to check labs including CBCD, ESR, CRP, flow cytometry of peripheral blood, BCR/ABL 1, JAK2 mutation analysis with reflex testing.  Patient did not complete labs today by mistake.  We will schedule this next week.  Given the chronicity of leukocytosis, acute bone marrow process is less likely.  Patient was provided reassurance.  I will call her in 2 to 3 weeks to discuss results of workup over the phone.  Plan to see her again in 4 months for follow-up with repeat labs.

## 2024-06-03 NOTE — Telephone Encounter (Signed)
 Scheduled patient appointments. Called and left a voicemail with appointment details.

## 2024-06-03 NOTE — Progress Notes (Signed)
 Alicia Lucero  HEMATOLOGY CLINIC CONSULTATION NOTE    PATIENT NAME: Alicia Lucero   MR#: [id number] DOB: Dec 13, 1960  DATE OF SERVICE: 06/03/2024  REFERRING PROVIDER:  Lucius Krabbe, NP   Patient Care Team: Lucius Krabbe, NP as PCP - General (Family Medicine) Barbette Knock, MD as Consulting Physician (Obstetrics and Gynecology)  REASON FOR CONSULTATION/ CHIEF COMPLAINT:  Evaluation of leukocytosis.   ASSESSMENT & PLAN:  Alicia Lucero is a 63 y.o. lady with a past medical history of hypertension, diabetes mellitus, dyslipidemia, hepatic steatosis, obesity, was referred to our service for evaluation of leukocytosis.    Leukocytosis Chronic mild leukocytosis with white blood cell counts ranging from 11,500 to 12,000, peaking at 19,000 during a kidney stone episode in June 2025. Differential includes infection, inflammation, stress, bone marrow disorders, and medication effects. No evidence of acute bone marrow pathology or concerning differential counts. Likely benign leukocytosis, requiring no intervention but monitoring.  We will plan to check labs including CBCD, ESR, CRP, flow cytometry of peripheral blood, BCR/ABL 1, JAK2 mutation analysis with reflex testing.  Patient did not complete labs today by mistake.  We will schedule this next week.  Given the chronicity of leukocytosis, acute bone marrow process is less likely.  Patient was provided reassurance.  I will call her in 2 to 3 weeks to discuss results of workup over the phone.  Plan to see her again in 4 months for follow-up with repeat labs.  Recent mild anemia Intermittent mild anemia with hemoglobin occasionally dropping below normal limits, lowest recorded at 10.6. Possible causes include surgical blood loss or iron deficiency. Further evaluation of iron levels is necessary to determine etiology. - Order iron studies to evaluate for iron deficiency.  I reviewed lab results and outside  records for this visit and discussed relevant results with the patient. Diagnosis, plan of care and treatment options were also discussed in detail with the patient. Opportunity provided to ask questions and answers provided to her apparent satisfaction. Provided instructions to call our clinic with any problems, questions or concerns prior to return visit. I recommended to continue follow-up with PCP and sub-specialists. She verbalized understanding and agreed with the plan. No barriers to learning was detected.  Chinita Patten, MD  06/03/2024 5:44 PM  [facility] - A DEPT OF [facility] [address] [place] [address] Dept: [phone] Dept Fax: [phone]   HISTORY OF PRESENTING ILLNESS:   Discussed the use of AI scribe software for clinical note transcription with the patient, who gave verbal consent to proceed.  History of Present Illness Alicia Lucero is a 63 year old female who presents with persistently elevated white blood cell count. She was referred by her primary doctor for evaluation of elevated white blood cell count.  Recently labs on 04/22/2024 showed white count of 11,600, hemoglobin 10.6, platelet count 320,000.  She has had leukocytosis chronically at least since 2010 with white count in the range of 9000 and 19,200 (highest value in June 2025).  She was referred to us  for further evaluation of leukocytosis.  On review of records, she has had occasional increase in monocyte count and eosinophil count.  She quit smoking approximately in 2007.  She has had an elevated white blood cell count for years, with records dating back to 2010 showing levels typically around 11,500 to 12,000. The highest recorded was 19,000 in June 2025 during an episode  of kidney stones, for which she underwent surgery twice. In the past, elevated counts were often attributed to infections, and she was prescribed antibiotics.  No  family history of lupus or rheumatoid arthritis, although her half-sister has multiple sclerosis.    MEDICAL HISTORY:  Past Medical History:  Diagnosis Date   Allergy    Anxiety 1992   Arthritis 2007   Back pain    Benign essential HTN    Bipolar 1 disorder (HCC)    Cataract 2019   Chest pain    De Quervain's disease (radial styloid tenosynovitis) 07/08/2019   Depression    Diabetes mellitus without complication (HCC)    Diabetic peripheral neuropathy (HCC)    Drug use    Dyspnea    Fatty liver    Gallbladder problem    GERD (gastroesophageal reflux disease)    History of kidney stones    Hyperlipidemia    Infertility, female    Joint pain    Microalbuminuria 06/07/2021   Neuropathy    PCOS (polycystic ovarian syndrome)    Perimenopausal    Pneumonia    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) 12/12/2021   Sciatica    Trigger finger of left thumb 07/08/2019   Type 2 diabetes mellitus with retinopathy, with long-term current use of insulin  (HCC) 06/01/2017    SURGICAL HISTORY: Past Surgical History:  Procedure Laterality Date   BREAST BIOPSY Right 03/13/2023   MM RT BREAST BX W LOC DEV 1ST LESION IMAGE BX SPEC STEREO GUIDE 03/13/2023 [facility]MO[facility]AP[facility]   CHOLECYSTECTOMY  09/08/1994   CYSTOSCOPY W/ RETROGRADES Left 03/17/2024   Procedure: CYSTOSCOPY, WITH RETROGRADE PYELOGRAM;  Surgeon: Shane Steffan BROCKS, MD;  Location: [facility];  Service: Urology;  Laterality: Left;   CYSTOSCOPY W/ RETROGRADES Left 04/19/2024   Procedure: CYSTOSCOPY, WITH RETROGRADE PYELOGRAM;  Surgeon: Shane Steffan BROCKS, MD;  Location: [facility];  Service: Urology;  Laterality: Left;   CYSTOSCOPY/URETEROSCOPY/HOLMIUM LASER/STENT PLACEMENT Left 03/17/2024   Procedure: CYSTOSCOPY/URETEROSCOPY/STENT PLACEMENT;  Surgeon: Shane Steffan BROCKS, MD;  Location: [facility];  Service: Urology;  Laterality: Left;   CYSTOSCOPY/URETEROSCOPY/HOLMIUM LASER/STENT PLACEMENT Left 04/19/2024   Procedure:  CYSTOSCOPY/URETEROSCOPY/HOLMIUM LASER/STENT PLACEMENT;  Surgeon: Shane Steffan BROCKS, MD;  Location: [facility];  Service: Urology;  Laterality: Left;   DILATION AND CURETTAGE OF UTERUS     secondary to menorrhagia   EYE SURGERY  2021   Cataract temoval   KNEE ARTHROSCOPY Left 09/08/2006   TONSILLECTOMY     as a child   WISDOM TOOTH EXTRACTION      SOCIAL HISTORY: She reports that she quit smoking about 18 years ago. Her smoking use included cigarettes. She started smoking about 43 years ago. She has a 25 pack-year smoking history. She has never used smokeless tobacco. She reports that she does not drink alcohol and does not use drugs. Social History   Socioeconomic History   Marital status: Married    Spouse name: [name]   Number of children: 0   Years of education: Assoc.   Highest education level: Associate degree: occupational, Scientist, product/process development, or vocational program  Occupational History   Occupation: professional caregiver  Tobacco Use   Smoking status: Former    Current packs/day: 0.00    Average packs/day: 1 pack/day for 25.0 years (25.0 ttl pk-yrs)    Types: Cigarettes    Start date: 06/30/1980    Quit date: 06/30/2005    Years since quitting: 18.9   Smokeless tobacco: Never   Tobacco comments:    electronic cigarettes  PRN  Vaping Use   Vaping status: Never Used  Substance and Sexual Activity   Alcohol use: No    Comment: quit: 2007   Drug use: No    Comment: occasional   Sexual activity: Not Currently    Partners: Male    Birth control/protection: Post-menopausal, None    Comment: 1st intercourse- 16, partners- 10, married- 15 yr s  Other Topics Concern   Not on file  Social History Narrative   Patient lives at home with her spouse.   Caffeine Use: 5-7 caffeine drinks daily   Social Drivers of Corporate investment banker Strain: Low Risk  (04/27/2024)   Overall Financial Resource Strain (CARDIA)    Difficulty of Paying Living Expenses: Not very hard  Food  Insecurity: No Food Insecurity (06/03/2024)   Hunger Vital Sign    Worried About Running Out of Food in the Last Year: Never true    Ran Out of Food in the Last Year: Never true  Transportation Needs: No Transportation Needs (04/27/2024)   PRAPARE - Administrator, Civil Service (Medical): No    Lack of Transportation (Non-Medical): No  Physical Activity: Inactive (04/27/2024)   Exercise Vital Sign    Days of Exercise per Week: 0 days    Minutes of Exercise per Session: Not on file  Stress: Stress Concern Present (04/27/2024)   [employer] of Occupational Health - Occupational Stress Questionnaire    Feeling of Stress: Very much  Social Connections: Moderately Isolated (04/27/2024)   Social Connection and Isolation Panel    Frequency of Communication with Friends and Family: More than three times a week    Frequency of Social Gatherings with Friends and Family: More than three times a week    Attends Religious Services: Never    Database administrator or Organizations: No    Attends Engineer, structural: Not on file    Marital Status: Married  Catering manager Violence: Not At Risk (06/03/2024)   Humiliation, Afraid, Rape, and Kick questionnaire    Fear of Current or Ex-Partner: No    Emotionally Abused: No    Physically Abused: No    Sexually Abused: No    FAMILY HISTORY: Family History  Problem Relation Age of Onset   Mental illness Mother        bipolar mania   Hypertension Mother    Depression Mother    Early death Mother    Lung cancer Mother    Hypertension Father    Heart disease Father 36       CAD/CABG   Arthritis Father    Liver cancer Father    Multiple sclerosis Sister    Hypertension Sister    Diabetes Sister     ALLERGIES:  She is allergic to codeine , doxycycline , empagliflozin -linagliptin , jardiance  [empagliflozin ], and other.  MEDICATIONS:  Current Outpatient Medications  Medication Sig Dispense Refill   ACCU-CHEK AVIVA PLUS  test strip USE AS DIRECTED 100 each 0   ACCU-CHEK SOFTCLIX LANCETS lancets USE AS DIRECTED 100 each 1   acetaminophen  (TYLENOL ) 325 MG tablet Take 2 tablets (650 mg total) by mouth every 6 (six) hours as needed for mild pain (pain score 1-3) or fever (or Fever >/= 101).     Brexpiprazole  0.5 MG TABS Take 0.5 mg by mouth every evening.     clotrimazole  (LOTRIMIN ) 1 % cream Apply 1 Application topically 2 (two) times daily. 60 g 2   esomeprazole (NEXIUM) 40 MG capsule  Take 40 mg by mouth daily.     gabapentin  (NEURONTIN ) 600 MG tablet Take 1,200 mg by mouth 2 (two) times daily.  0   insulin  glargine (LANTUS ) 100 UNIT/ML Solostar Pen Inject 64 Units into the skin daily. 15 mL 3   Insulin  Pen Needle (B-D ULTRAFINE III SHORT PEN) 31G X 8 MM MISC daily 100 each 3   lamoTRIgine  (LAMICTAL ) 200 MG tablet Take 2 tablets (400 mg total) by mouth every evening. 180 tablet 1   lisdexamfetamine (VYVANSE ) 70 MG capsule Take 70 mg by mouth every morning.     lisinopril  (ZESTRIL ) 5 MG tablet TAKE 1 TABLET(5 MG) BY MOUTH DAILY 90 tablet 1   metFORMIN  (GLUCOPHAGE -XR) 500 MG 24 hr tablet Take 1 tablet (500 mg total) by mouth 2 (two) times daily with a meal. 180 tablet 0   Multiple Vitamins-Minerals (WOMENS MULTIVITAMIN PLUS PO) Take 1 tablet by mouth daily.      nystatin  (MYCOSTATIN /NYSTOP ) powder Apply 1 Application topically 3 (three) times daily. 30 g 2   ondansetron  (ZOFRAN -ODT) 4 MG disintegrating tablet 4mg  ODT q4 hours prn nausea/vomit (Patient taking differently: Take 4 mg by mouth every 8 (eight) hours as needed for vomiting or nausea.) 10 tablet 0   OZEMPIC , 2 MG/DOSE, 8 MG/3ML SOPN INJECT 2 MG INTO THE SKIN ONCE WEEKLY 9 mL 0   rosuvastatin  (CRESTOR ) 20 MG tablet Take 1 tablet (20 mg total) by mouth daily.     vortioxetine  HBr (TRINTELLIX ) 20 MG TABS tablet Take 20 mg by mouth daily.     No current facility-administered medications for this visit.    REVIEW OF SYSTEMS:    Review of Systems -  Oncology  All other pertinent systems were reviewed and were negative except as mentioned above.  PHYSICAL EXAMINATION:    Onc Performance Status - 06/03/24 1401       ECOG Perf Status   ECOG Perf Status Ambulatory and capable of all selfcare but unable to carry out any work activities.  Up and about more than 50% of waking hours      KPS SCALE   KPS % SCORE Cares for self, unable to carry on normal activity or to do active work           Vitals:   06/03/24 1349  BP: 139/80  Pulse: (!) 103  Resp: 20  Temp: 97.6 F (36.4 C)  SpO2: 97%   Filed Weights   06/03/24 1349  Weight: (!) 329 lb 4.8 oz (149.4 kg)    Physical Exam Constitutional:      General: She is not in acute distress.    Appearance: Normal appearance.  HENT:     Head: Normocephalic and atraumatic.  Cardiovascular:     Rate and Rhythm: Normal rate.  Pulmonary:     Effort: Pulmonary effort is normal. No respiratory distress.  Abdominal:     General: There is no distension.  Neurological:     General: No focal deficit present.     Mental Status: She is alert and oriented to person, place, and time.  Psychiatric:        Mood and Affect: Mood normal.        Behavior: Behavior normal.      LABORATORY DATA:   I have reviewed the data as listed.  No results found for any visits on 06/03/24.  RADIOGRAPHIC STUDIES:  No recent pertinent imaging studies available to review.  I spent a total of 55 minutes during this encounter  with the patient including review of chart and various tests results, discussions about plan of care and coordination of care plan.  Orders Placed This Encounter  Procedures   CBC with Differential ([facility] Only)    Standing Status:   Future    Expiration Date:   06/03/2025   CMP ([facility] only)    Standing Status:   Future    Expiration Date:   06/03/2025   Lactate dehydrogenase    Standing Status:   Future    Expiration Date:   06/03/2025   Sedimentation  rate    Standing Status:   Future    Expiration Date:   06/03/2025   C-reactive protein    Standing Status:   Future    Expiration Date:   06/03/2025   Flow Cytometry, Peripheral Blood (Oncology)    Standing Status:   Future    Expiration Date:   06/03/2025   BCR-ABL1 FISH    Standing Status:   Future    Expiration Date:   06/03/2025   JAK2 V617F rfx CALR/MPL/E12-15    Standing Status:   Future    Expiration Date:   06/03/2025   Iron and Iron Binding Capacity ([facility] only)    Standing Status:   Future    Expiration Date:   06/03/2025   Ferritin    Standing Status:   Future    Expiration Date:   06/03/2025     Future Appointments  Date Time Provider Department [facility]  06/17/2024  3:15 PM Tiann Saha, Chinita, MD [facility] None  10/05/2024  2:00 PM [facility] LAB [facility] None  10/05/2024  2:30 PM Dewan Emond, Chinita, MD [facility] None       This document was completed utilizing speech recognition software. Grammatical errors, random word insertions, pronoun errors, and incomplete sentences are an occasional consequence of this system due to software limitations, ambient noise, and hardware issues. Any formal questions or concerns about the content, text or information contained within the body of this dictation should be directly addressed to the provider for clarification.

## 2024-06-06 ENCOUNTER — Telehealth: Payer: Self-pay

## 2024-06-06 ENCOUNTER — Telehealth: Payer: Self-pay | Admitting: Oncology

## 2024-06-06 NOTE — Progress Notes (Signed)
 Complex Care Management Note Care Guide Note  06/06/2024 Name: Alicia Lucero MRN: [id number] DOB: 09-23-1960   Complex Care Management Outreach Attempts: A third unsuccessful outreach was attempted today to offer the patient with information about available complex care management services.  Follow Up Plan:  No further outreach attempts will be made at this time. We have been unable to contact the patient to offer or enroll patient in complex care management services.  Encounter Outcome:  No Answer  Thedford Franks, CMA, Care Guide [facility]  [place], [facility] Guide Direct Dial: [phone]  Fax: [phone] Website: [email or web address]

## 2024-06-06 NOTE — Telephone Encounter (Signed)
 Left a voicemail with patient letting her know she has a lab appointment on 06/07/2024 at 11:30.   Patient did not go to her lab appointment last Friday after meeting with Dr. Autumn for unknown reasons.

## 2024-06-06 NOTE — Telephone Encounter (Signed)
 Scheduled patient to get labs tomorrow. Called and left a voicemail letting the patient know.

## 2024-06-07 ENCOUNTER — Inpatient Hospital Stay

## 2024-06-16 DIAGNOSIS — F3132 Bipolar disorder, current episode depressed, moderate: Secondary | ICD-10-CM | POA: Diagnosis not present

## 2024-06-17 ENCOUNTER — Inpatient Hospital Stay: Attending: Oncology | Admitting: Oncology

## 2024-06-17 ENCOUNTER — Telehealth: Payer: Self-pay | Admitting: Oncology

## 2024-06-17 DIAGNOSIS — D72829 Elevated white blood cell count, unspecified: Secondary | ICD-10-CM

## 2024-06-17 NOTE — Telephone Encounter (Signed)
 I attempted to contact [name] to schedule an appt for [name]. [name] does not have a VM box set up.

## 2024-07-04 DIAGNOSIS — F3181 Bipolar II disorder: Secondary | ICD-10-CM | POA: Diagnosis not present

## 2024-07-11 ENCOUNTER — Encounter: Payer: Self-pay | Admitting: Radiology

## 2024-07-15 NOTE — Progress Notes (Signed)
  Patient did not answer phone on multiple attempts. Left voicemail.  She was a no-show for lab only appointment also. She somehow missed labs on the day that we saw her for consultation.  If we can get hold of her, we can do labs and further appointments will be arranged accordingly.

## 2024-07-26 ENCOUNTER — Ambulatory Visit: Admitting: Orthopaedic Surgery

## 2024-07-26 DIAGNOSIS — M1712 Unilateral primary osteoarthritis, left knee: Secondary | ICD-10-CM | POA: Diagnosis not present

## 2024-07-26 MED ORDER — BUPIVACAINE HCL 0.5 % IJ SOLN
2.0000 mL | INTRAMUSCULAR | Status: AC | PRN
  Administered 2024-07-26: 2 mL via INTRA_ARTICULAR

## 2024-07-26 MED ORDER — LIDOCAINE HCL 1 % IJ SOLN
2.0000 mL | INTRAMUSCULAR | Status: AC | PRN
  Administered 2024-07-26: 2 mL

## 2024-07-26 MED ORDER — METHYLPREDNISOLONE ACETATE 40 MG/ML IJ SUSP
40.0000 mg | INTRAMUSCULAR | Status: AC | PRN
  Administered 2024-07-26: 40 mg via INTRA_ARTICULAR

## 2024-07-26 NOTE — Progress Notes (Signed)
 Office Visit Note   Patient: Alicia Lucero           Date of Birth: 17-Nov-1960           MRN: [id number] Visit Date: 07/26/2024              Requested by: Lucius Krabbe, NP [address] [place],  [place] [address] PCP: Lucius Krabbe, NP   Assessment & Plan: Visit Diagnoses:  1. Primary osteoarthritis of left knee     Plan: History of Present Illness She is a 63 year old female who presents for a follow-up of her left knee pain.  She has been receiving injections in her left knee since January, which have been effective in providing significant pain relief. She is working on weight loss, although progress is slower than desired.  Assessment and Plan Left knee pain from osteoarthritis. Chronic pain managed with injections. Last injection provided relief since January. - Administered injection for left knee pain.  Follow-Up Instructions: No follow-ups on file.   Orders:  No orders of the defined types were placed in this encounter.  No orders of the defined types were placed in this encounter.     Procedures: Large Joint Inj: L knee on 07/26/2024 11:40 AM Details: 22 G needle Medications: 2 mL bupivacaine  0.5 %; 2 mL lidocaine  1 %; 40 mg methylPREDNISolone  acetate 40 MG/ML Outcome: tolerated well, no immediate complications Patient was prepped and draped in the usual sterile fashion.        Past Surgical History:  Procedure Laterality Date   BREAST BIOPSY Right 03/13/2023   MM RT BREAST BX W LOC DEV 1ST LESION IMAGE BX SPEC STEREO GUIDE 03/13/2023 [facility]MO[facility]   CHOLECYSTECTOMY  09/08/1994   CYSTOSCOPY W/ RETROGRADES Left 03/17/2024   Procedure: CYSTOSCOPY, WITH RETROGRADE PYELOGRAM;  Surgeon: Shane Steffan BROCKS, MD;  [facility];  Service: Urology;  Laterality: Left;   CYSTOSCOPY W/ RETROGRADES Left 04/19/2024   Procedure: CYSTOSCOPY, WITH RETROGRADE PYELOGRAM;  Surgeon: Shane Steffan BROCKS, MD;  Location[facility];  Service: Urology;   Laterality: Left;   CYSTOSCOPY/URETEROSCOPY/HOLMIUM LASER/STENT PLACEMENT Left 03/17/2024   Procedure: CYSTOSCOPY/URETEROSCOPY/STENT PLACEMENT;  Surgeon: Shane Steffan BROCKS, MD;  [facility];  Service: Urology;  Laterality: Left;   CYSTOSCOPY/URETEROSCOPY/HOLMIUM LASER/STENT PLACEMENT Left 04/19/2024   Procedure: CYSTOSCOPY/URETEROSCOPY/HOLMIUM LASER/STENT PLACEMENT;  Surgeon: Shane Steffan BROCKS, MD;  Location[facility];  Service: Urology;  Laterality: Left;   DILATION AND CURETTAGE OF UTERUS     secondary to menorrhagia   EYE SURGERY  2021   Cataract temoval   KNEE ARTHROSCOPY Left 09/08/2006   TONSILLECTOMY     as a child   WISDOM TOOTH EXTRACTION     Social History   Occupational History   Occupation: professional caregiver  Tobacco Use   Smoking status: Former    Current packs/day: 0.00    Average packs/day: 1 pack/day for 25.0 years (25.0 ttl pk-yrs)    Types: Cigarettes    Start date: 06/30/1980    Quit date: 06/30/2005    Years since quitting: 19.0   Smokeless tobacco: Never   Tobacco comments:    electronic cigarettes PRN  Vaping Use   Vaping status: Never Used  Substance and Sexual Activity   Alcohol use: No    Comment: quit: 2007   Drug use: No    Comment: occasional   Sexual activity: Not Currently    Partners: Male    Birth control/protection: Post-menopausal, None    Comment: 1st  intercourse- 16, partners- 10, married- 15 yr s

## 2024-08-02 ENCOUNTER — Ambulatory Visit: Admitting: Orthopaedic Surgery

## 2024-08-02 DIAGNOSIS — F3181 Bipolar II disorder: Secondary | ICD-10-CM | POA: Diagnosis not present

## 2024-08-23 DIAGNOSIS — F3132 Bipolar disorder, current episode depressed, moderate: Secondary | ICD-10-CM | POA: Diagnosis not present

## 2024-08-25 ENCOUNTER — Telehealth: Payer: Self-pay | Admitting: Family

## 2024-08-25 NOTE — Telephone Encounter (Signed)
Approved, ok to schedule

## 2024-08-25 NOTE — Telephone Encounter (Signed)
 Patient requests TOC from [facility] to Dr. Jesus due to wants the same PCP as spouse.  Please advise.[email or web address]

## 2024-09-26 ENCOUNTER — Other Ambulatory Visit (HOSPITAL_COMMUNITY): Payer: Self-pay

## 2024-09-26 ENCOUNTER — Encounter: Payer: Self-pay | Admitting: Internal Medicine

## 2024-09-26 ENCOUNTER — Telehealth: Payer: Self-pay

## 2024-09-26 ENCOUNTER — Ambulatory Visit: Admitting: Internal Medicine

## 2024-09-26 VITALS — BP 126/80 | HR 80 | Temp 98.0°F | Ht 65.0 in | Wt 308.2 lb

## 2024-09-26 DIAGNOSIS — E1142 Type 2 diabetes mellitus with diabetic polyneuropathy: Secondary | ICD-10-CM

## 2024-09-26 DIAGNOSIS — F3181 Bipolar II disorder: Secondary | ICD-10-CM | POA: Diagnosis not present

## 2024-09-26 DIAGNOSIS — E1169 Type 2 diabetes mellitus with other specified complication: Secondary | ICD-10-CM

## 2024-09-26 DIAGNOSIS — F50819 Binge eating disorder, unspecified: Secondary | ICD-10-CM | POA: Diagnosis not present

## 2024-09-26 DIAGNOSIS — N200 Calculus of kidney: Secondary | ICD-10-CM

## 2024-09-26 DIAGNOSIS — Z794 Long term (current) use of insulin: Secondary | ICD-10-CM | POA: Diagnosis not present

## 2024-09-26 DIAGNOSIS — E785 Hyperlipidemia, unspecified: Secondary | ICD-10-CM

## 2024-09-26 DIAGNOSIS — Z6841 Body Mass Index (BMI) 40.0 and over, adult: Secondary | ICD-10-CM

## 2024-09-26 DIAGNOSIS — R110 Nausea: Secondary | ICD-10-CM | POA: Diagnosis not present

## 2024-09-26 DIAGNOSIS — E559 Vitamin D deficiency, unspecified: Secondary | ICD-10-CM

## 2024-09-26 DIAGNOSIS — I152 Hypertension secondary to endocrine disorders: Secondary | ICD-10-CM

## 2024-09-26 DIAGNOSIS — E1140 Type 2 diabetes mellitus with diabetic neuropathy, unspecified: Secondary | ICD-10-CM

## 2024-09-26 DIAGNOSIS — R319 Hematuria, unspecified: Secondary | ICD-10-CM

## 2024-09-26 DIAGNOSIS — R911 Solitary pulmonary nodule: Secondary | ICD-10-CM | POA: Diagnosis not present

## 2024-09-26 DIAGNOSIS — E1159 Type 2 diabetes mellitus with other circulatory complications: Secondary | ICD-10-CM

## 2024-09-26 DIAGNOSIS — E13319 Other specified diabetes mellitus with unspecified diabetic retinopathy without macular edema: Secondary | ICD-10-CM

## 2024-09-26 DIAGNOSIS — D72829 Elevated white blood cell count, unspecified: Secondary | ICD-10-CM

## 2024-09-26 DIAGNOSIS — K219 Gastro-esophageal reflux disease without esophagitis: Secondary | ICD-10-CM

## 2024-09-26 LAB — LIPID PANEL
Cholesterol: 148 mg/dL (ref 28–200)
HDL: 69.1 mg/dL
LDL Cholesterol: 55 mg/dL (ref 10–99)
NonHDL: 79.15
Total CHOL/HDL Ratio: 2
Triglycerides: 123 mg/dL (ref 10.0–149.0)
VLDL: 24.6 mg/dL (ref 0.0–40.0)

## 2024-09-26 LAB — CBC WITH DIFFERENTIAL/PLATELET
Basophils Absolute: 0.1 K/uL (ref 0.0–0.1)
Basophils Relative: 0.8 % (ref 0.0–3.0)
Eosinophils Absolute: 1.2 K/uL — ABNORMAL HIGH (ref 0.0–0.7)
Eosinophils Relative: 8.3 % — ABNORMAL HIGH (ref 0.0–5.0)
HCT: 43.5 % (ref 36.0–46.0)
Hemoglobin: 14.3 g/dL (ref 12.0–15.0)
Lymphocytes Relative: 21 % (ref 12.0–46.0)
Lymphs Abs: 3.1 K/uL (ref 0.7–4.0)
MCHC: 32.9 g/dL (ref 30.0–36.0)
MCV: 84.7 fl (ref 78.0–100.0)
Monocytes Absolute: 0.7 K/uL (ref 0.1–1.0)
Monocytes Relative: 4.9 % (ref 3.0–12.0)
Neutro Abs: 9.6 K/uL — ABNORMAL HIGH (ref 1.4–7.7)
Neutrophils Relative %: 65 % (ref 43.0–77.0)
Platelets: 412 K/uL — ABNORMAL HIGH (ref 150.0–400.0)
RBC: 5.14 Mil/uL — ABNORMAL HIGH (ref 3.87–5.11)
RDW: 15.9 % — ABNORMAL HIGH (ref 11.5–15.5)
WBC: 14.8 K/uL — ABNORMAL HIGH (ref 4.0–10.5)

## 2024-09-26 LAB — COMPREHENSIVE METABOLIC PANEL WITH GFR
ALT: 14 U/L (ref 3–35)
AST: 20 U/L (ref 5–37)
Albumin: 4.5 g/dL (ref 3.5–5.2)
Alkaline Phosphatase: 101 U/L (ref 39–117)
BUN: 13 mg/dL (ref 6–23)
CO2: 21 meq/L (ref 19–32)
Calcium: 9.8 mg/dL (ref 8.4–10.5)
Chloride: 101 meq/L (ref 96–112)
Creatinine, Ser: 0.83 mg/dL (ref 0.40–1.20)
GFR: 76.12 mL/min
Glucose, Bld: 93 mg/dL (ref 70–99)
Potassium: 4.1 meq/L (ref 3.5–5.1)
Sodium: 138 meq/L (ref 135–145)
Total Bilirubin: 0.3 mg/dL (ref 0.2–1.2)
Total Protein: 8 g/dL (ref 6.0–8.3)

## 2024-09-26 LAB — VITAMIN D 25 HYDROXY (VIT D DEFICIENCY, FRACTURES): VITD: 24.6 ng/mL — ABNORMAL LOW (ref 30.00–100.00)

## 2024-09-26 LAB — HEMOGLOBIN A1C: Hgb A1c MFr Bld: 6.2 % (ref 4.6–6.5)

## 2024-09-26 LAB — MICROALBUMIN / CREATININE URINE RATIO
Creatinine,U: 47.6 mg/dL
Microalb Creat Ratio: UNDETERMINED mg/g (ref 0.0–30.0)
Microalb, Ur: <0.7 mg/dL

## 2024-09-26 MED ORDER — TIRZEPATIDE 2.5 MG/0.5ML ~~LOC~~ SOAJ: 2.5000 mg | SUBCUTANEOUS | 11 refills | Status: AC

## 2024-09-26 MED ORDER — PROCHLORPERAZINE MALEATE 10 MG PO TABS: 10.0000 mg | ORAL_TABLET | Freq: Four times a day (QID) | ORAL | 0 refills | Status: AC | PRN

## 2024-09-26 NOTE — Assessment & Plan Note (Signed)
"   Following with hematology and no concern(s) after bcr abl and jak and smear testing, chronic "

## 2024-09-26 NOTE — Assessment & Plan Note (Signed)
 Well-managed on the current medication regimen, including Rexulti , Tramadol, and lamotrigine , which will be continued.

## 2024-09-26 NOTE — Assessment & Plan Note (Signed)
 Well-controlled Reviewed available data from patient and  BP Readings from Last 3 Encounters:  09/26/24 126/80  06/03/24 139/80  04/28/24 115/68  continues on, as of 09/26/2024 Current hypertension medications:       Sig   lisinopril  (ZESTRIL ) 5 MG tablet (Taking) TAKE 1 TABLET(5 MG) BY MOUTH DAILY

## 2024-09-26 NOTE — Assessment & Plan Note (Signed)
 Managed effectively with Vyvanse  70 mg, which will be continued.

## 2024-09-26 NOTE — Assessment & Plan Note (Signed)
 Symptoms are controlled with daily Nexium, which will be continued.

## 2024-09-26 NOTE — Assessment & Plan Note (Signed)
 She has experienced significant weight loss on Ozempic  but suffers from nausea, vomiting, and diarrhea at the current 2 mg dose. A switch to Mounjaro  is considered for its better efficacy and reduced nausea, pending insurance approval. Surgery is a potential option if Mounjaro  is not approved. Compazine  is prescribed for nausea management. Mounjaro  has been ordered, and an appeal for insurance approval will be made. She is referred to a nutritionist.

## 2024-09-26 NOTE — Progress Notes (Signed)
 ==============================   [facility]: [phone]   -- Medical Office Visit --  Patient: Alicia Lucero      Age: 64 y.o.       Sex:  female  Date:   09/26/2024 Today's Healthcare Provider: Bernardino KANDICE Cone, MD  ==============================   Chief Complaint: Medication Problem (Ozempic  side effects would like to discus transferring care to pcp today )  Discussed the use of AI scribe software for clinical note transcription with the patient, who gave verbal consent to proceed.  History of Present Illness 64 year old female with type 2 diabetes who presents with significant weight loss, nausea, and vomiting while on Ozempic .  She has experienced significant weight loss, approximately 30 pounds over the past year, while on Ozempic . She has been on this medication for about two years, with intermittent issues in obtaining it due to pharmacy delays. Despite these issues, she has continued the medication due to concerns about weight gain and knee pain.  She experiences persistent nausea and vomiting, which she attributes to Ozempic . She describes days where she can only consume minimal food, such as 'a pack of crackers and a Coke', to settle her stomach. There was a period of about six months where she did not experience these symptoms, but they have since returned, leading to an emergency room visit for IV fluids.  Her medication regimen includes Vyvanse  70 mg for binge eating disorder, Rexulti , Tramadol, and lamotrigine  for bipolar II disorder, and gabapentin  for diabetic peripheral neuropathy and anxiety. She also takes rosuvastatin  for cholesterol management and Nexium daily for reflux.  She has a history of kidney stones, with surgery in July and August of the previous year. The kidney stone was initially misdiagnosed in terms of size, leading to prolonged pain and eventual surgical intervention.  She has a history of asthma, though she does  not use an inhaler regularly. She has a history of smoking, having quit tobacco in 2006, but continues to smoke marijuana. She reports a high white blood cell count that has been evaluated by a hematologist and attributed to inflammation and diabetes.  She has a family history of colon cancer and reports taking magnesium to aid sleep and reduce muscle cramps associated with rosuvastatin  use. She has a history of polycystic ovarian syndrome, which contributed to significant weight gain in her late teens, but is now postmenopausal.  She reports that she does not use an inhaler and does not have asthma that she knows of, though she keeps an inhaler in the house but does not use it. Reports tingling in feet if gabapentin  is not taken. No postmenopausal bleeding.  Wt Readings from Last 50 Encounters:  09/26/24 (!) 308 lb 3.2 oz (139.8 kg)  06/03/24 (!) 329 lb 4.8 oz (149.4 kg)  04/28/24 (!) 322 lb (146.1 kg)  04/21/24 (!) 322 lb 5 oz (146.2 kg)  04/19/24 (!) 323 lb (146.5 kg)  04/12/24 (!) 323 lb (146.5 kg)  03/17/24 (!) 324 lb (147 kg)  03/07/24 (!) 324 lb (147 kg)  12/10/23 (!) 339 lb 8.1 oz (154 kg)  12/09/23 (!) 340 lb (154.2 kg)  10/12/23 (!) 340 lb (154.2 kg)  01/05/23 (!) 321 lb 6 oz (145.8 kg)  05/02/22 (!) 317 lb (143.8 kg)  03/12/22 (!) 313 lb 6 oz (142.1 kg)  02/25/22 (!) 314 lb (142.4 kg)  12/12/21 (!) 315 lb 8 oz (143.1 kg)  11/18/21 299 lb 15 oz (136 kg)  11/02/21 ROLLEN)  320 lb (145.2 kg)  09/24/21 (!) 318 lb (144.2 kg)  06/03/18 (!) 323 lb 8 oz (146.7 kg)  04/14/18 (!) 336 lb (152.4 kg)  12/25/17 (!) 347 lb 3.2 oz (157.5 kg)  07/16/17 (!) 336 lb (152.4 kg)  07/01/17 (!) 339 lb (153.8 kg)  06/15/17 (!) 352 lb (159.7 kg)  06/01/17 (!) 338 lb (153.3 kg)  05/18/17 (!) 340 lb (154.2 kg)  04/28/17 (!) 346 lb (156.9 kg)  04/17/17 (!) 348 lb (157.9 kg)  12/24/16 (!) 346 lb (156.9 kg)  10/31/16 (!) 344 lb 6.4 oz (156.2 kg)  08/27/16 (!) 342 lb (155.1 kg)  06/25/16 (!) 347 lb 6.4  oz (157.6 kg)  03/04/16 (!) 335 lb 3.2 oz (152 kg)  02/11/16 (!) 336 lb (152.4 kg)  01/02/16 (!) 331 lb 12.8 oz (150.5 kg)  11/13/15 (!) 340 lb 3.2 oz (154.3 kg)  07/28/15 (!) 320 lb 9.6 oz (145.4 kg)  07/22/15 (!) 328 lb 7.8 oz (149 kg)  07/11/15 (!) 322 lb (146.1 kg)  07/04/15 (!) 325 lb (147.4 kg)  05/25/15 (!) 330 lb 9.6 oz (150 kg)  03/01/15 (!) 321 lb (145.6 kg)  12/21/14 (!) 311 lb (141.1 kg)  10/08/14 (!) 309 lb (140.2 kg)  08/24/14 (!) 309 lb 12.8 oz (140.5 kg)  07/20/14 (!) 312 lb (141.5 kg)  04/06/14 (!) 317 lb 3.2 oz (143.9 kg)  01/23/14 (!) 322 lb (146.1 kg)  12/15/13 (!) 326 lb (147.9 kg)   BMI Readings from Last 50 Encounters:  09/26/24 51.29 kg/m  06/03/24 54.80 kg/m  04/28/24 53.58 kg/m  04/21/24 53.64 kg/m  04/19/24 53.75 kg/m  04/12/24 53.75 kg/m  03/17/24 53.92 kg/m  03/07/24 53.92 kg/m  12/10/23 56.50 kg/m  12/09/23 56.58 kg/m  10/12/23 56.58 kg/m  01/05/23 53.48 kg/m  05/02/22 52.75 kg/m  03/12/22 53.79 kg/m  02/25/22 53.90 kg/m  12/12/21 54.16 kg/m  11/18/21 51.48 kg/m  11/02/21 54.08 kg/m  09/24/21 53.74 kg/m  06/03/18 59.17 kg/m  04/14/18 59.52 kg/m  12/25/17 59.60 kg/m  07/16/17 57.67 kg/m  07/01/17 58.19 kg/m  06/15/17 60.42 kg/m  06/01/17 58.02 kg/m  05/18/17 58.36 kg/m  04/28/17 58.47 kg/m  04/17/17 58.81 kg/m  12/24/16 61.29 kg/m  10/31/16 59.58 kg/m  08/27/16 56.91 kg/m  06/25/16 57.81 kg/m  03/04/16 55.78 kg/m  02/11/16 58.13 kg/m  01/02/16 57.40 kg/m  11/13/15 58.40 kg/m  08/14/15 55.03 kg/m  07/28/15 55.03 kg/m  07/22/15 56.38 kg/m  07/19/15 55.27 kg/m  07/11/15 55.27 kg/m  07/04/15 55.79 kg/m  05/25/15 56.75 kg/m  03/01/15 55.10 kg/m  12/21/14 53.38 kg/m  10/08/14 52.63 kg/m  08/24/14 53.18 kg/m  07/20/14 53.55 kg/m  04/06/14 53.19 kg/m    Lab Results  Component Value Date   CHOL 201 (H) 01/05/2023   CHOL 164 05/18/2017   HDL 67.40 01/05/2023   HDL 67  05/18/2017   CHOLHDL 3 01/05/2023   CHOLHDL 2.6 04/17/2017   LDLCALC 104 (H) 01/05/2023   LDLCALC 76 05/18/2017   TRIG 149.0 01/05/2023   TRIG 106 05/18/2017   VLDL 29.8 01/05/2023   VLDL 27 06/25/2016   LABVLDL 21 05/18/2017   LABVLDL 18 04/17/2017   The 10-year ASCVD risk score (Arnett DK, et al., 2019) is: 8%   Values used to calculate the score:     Age: 41 years     Clinically relevant sex: Female     Is Non-Hispanic African American: No     Diabetic: Yes     Tobacco  smoker: No     Systolic Blood Pressure: 126 mmHg     Is BP treated: Yes     HDL Cholesterol: 67.4 mg/dL     Total Cholesterol: 201 mg/dL Background Reviewed: Problem List: has Morbid obesity with BMI of 50.0-59.9, adult (HCC); Asthma, chronic; Type 2 diabetes mellitus with diabetic neuropathy, with long-term current use of insulin  (HCC); Dyslipidemia associated with type 2 diabetes mellitus (HCC); Primary osteoarthritis of left knee; Vitamin D  deficiency; Bipolar 2 disorder (HCC); Gastroesophageal reflux disease without esophagitis; PCOS (polycystic ovarian syndrome); Binge eating disorder; Diabetic peripheral neuropathy (HCC); Diverticular disease of colon; Family history of colon cancer; 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 Past Medical History:  has a past medical history of Allergy, Anxiety (1992), Arthritis (2007), Back pain, Benign essential HTN, Bipolar 1 disorder (HCC), Cataract (2019), Chest pain, De Quervain's disease (radial styloid tenosynovitis) (07/08/2019), Depression, Diabetes mellitus without complication (HCC), Diabetic peripheral neuropathy (HCC), Drug use, Dyspnea, Fatty liver, Gallbladder problem, GERD (gastroesophageal reflux disease), History of kidney stones, Hyperlipidemia, Infertility, female, Joint pain, Microalbuminuria (06/07/2021), Neuropathy, PCOS (polycystic ovarian  syndrome), Perimenopausal, Pneumonia, 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 Past Surgical History:   has a past surgical history that includes Cholecystectomy (09/08/1994); Tonsillectomy; Knee arthroscopy (Left, 09/08/2006); Dilation and curettage of uterus; Wisdom tooth extraction; Eye surgery (2021); Breast biopsy (Right, 03/13/2023); Cystoscopy/ureteroscopy/holmium laser/stent placement (Left, 03/17/2024); Cystoscopy w/ retrogrades (Left, 03/17/2024); Cystoscopy/ureteroscopy/holmium laser/stent placement (Left, 04/19/2024); and Cystoscopy w/ retrogrades (Left, 04/19/2024). Social History:   reports that she quit smoking about 19 years ago. Her smoking use included cigarettes. She started smoking about 44 years ago. She has a 25 pack-year smoking history. She has never used smokeless tobacco. She reports that she does not drink alcohol and does not use drugs. Family History:  family history includes Arthritis in her father; Depression in her mother; Diabetes in her sister; Early death in her mother; Heart disease (age of onset: 6) in her father; Hypertension in her father, mother, and sister; Liver cancer in her father; Lung cancer in her mother; Mental illness in her mother; Multiple sclerosis in her sister. Allergies:  is allergic to codeine , doxycycline , empagliflozin -linagliptin , jardiance  [empagliflozin ], and other.   Medication Reconciliation: Current Outpatient Medications on File Prior to Visit  Medication Sig   ACCU-CHEK AVIVA PLUS test strip USE AS DIRECTED   ACCU-CHEK SOFTCLIX LANCETS lancets USE AS DIRECTED   acetaminophen  (TYLENOL ) 325 MG tablet Take 2 tablets (650 mg total) by mouth every 6 (six) hours as needed for mild pain (pain score 1-3) or fever (or Fever >/= 101).   Brexpiprazole  0.5 MG TABS Take 0.5 mg by mouth  every evening.   clotrimazole  (LOTRIMIN ) 1 % cream Apply 1 Application topically 2 (two) times daily.   esomeprazole (NEXIUM) 40 MG capsule Take 40 mg by mouth daily.   gabapentin  (NEURONTIN ) 600 MG tablet Take 1,200 mg by mouth 2 (two) times daily.   insulin  glargine (LANTUS ) 100 UNIT/ML Solostar Pen Inject 64 Units into the skin daily.   Insulin  Pen Needle (B-D ULTRAFINE III SHORT PEN) 31G X 8 MM MISC daily   lamoTRIgine  (LAMICTAL ) 200 MG tablet Take 2 tablets (400 mg total) by mouth every evening.   lisdexamfetamine  (VYVANSE ) 70 MG capsule Take 70 mg by mouth every morning.  lisinopril  (ZESTRIL ) 5 MG tablet TAKE 1 TABLET(5 MG) BY MOUTH DAILY   metFORMIN  (GLUCOPHAGE -XR) 500 MG 24 hr tablet Take 1 tablet (500 mg total) by mouth 2 (two) times daily with a meal.   Multiple Vitamins-Minerals (WOMENS MULTIVITAMIN PLUS PO) Take 1 tablet by mouth daily.    nystatin  (MYCOSTATIN /NYSTOP ) powder Apply 1 Application topically 3 (three) times daily.   OZEMPIC , 2 MG/DOSE, 8 MG/3ML SOPN INJECT 2 MG INTO THE SKIN ONCE WEEKLY   rosuvastatin  (CRESTOR ) 20 MG tablet Take 1 tablet (20 mg total) by mouth daily.   vortioxetine  HBr (TRINTELLIX ) 20 MG TABS tablet Take 20 mg by mouth daily.   ondansetron  (ZOFRAN -ODT) 4 MG disintegrating tablet 4mg  ODT q4 hours prn nausea/vomit (Patient taking differently: Take 4 mg by mouth every 8 (eight) hours as needed for vomiting or nausea.)   No current facility-administered medications on file prior to visit.  There are no discontinued medications.   Physical Exam:    09/26/2024   10:41 AM 06/03/2024    1:49 PM 04/28/2024    2:57 PM  Vitals with BMI  Height 5' 5 5' 5 5' 5  Weight 308 lbs 3 oz 329 lbs 5 oz 322 lbs  BMI 51.29 54.8 53.58  Systolic 126 139 884  Diastolic 80 80 68  Pulse 80 103 84  Vital signs reviewed.  Nursing notes reviewed. Weight trend reviewed. Physical Activity: Inactive (09/25/2024)   Exercise Vital Sign    Days of Exercise per Week: 0 days     Minutes of Exercise per Session: Not on file   General Appearance:  No acute distress appreciable.   Well-groomed, healthy-appearing female.  Well proportioned with no abnormal fat distribution.  Good muscle tone. Pulmonary:  Normal work of breathing at rest, no respiratory distress apparent. SpO2: 98 %  Musculoskeletal: All extremities are intact.  Neurological:  Awake, alert, oriented, and engaged.  No obvious focal neurological deficits or cognitive impairments.  Sensorium seems unclouded.   Speech is clear and coherent with logical content. Psychiatric:  Appropriate mood, pleasant and cooperative demeanor, thoughtful and engaged during the exam   Results:   Verbalized to patient: Results Labs CBC (04/2024): Leukocytosis Urinalysis (04/2024): Hematuria and proteinuria BCR-ABL: Within normal limits JAK2 mutation analysis: Within normal limits Peripheral smear: Within normal limits  Radiology CT chest (04/2024): Pulmonary nodule increased from 3 mm in 2016 to 6 mm in August 2025 CT abdomen/pelvis (03/2024): Large nephrolithiasis     06/03/2024    2:01 PM 01/05/2023    3:28 PM 12/25/2017    1:31 PM 05/18/2017    8:34 AM  PHQ 2/9 Scores  PHQ - 2 Score 6 2 0 6  PHQ- 9 Score 16  9   21       Data saved with a previous flowsheet row definition    {   No results found for any visits on 09/26/24.} Admission on 04/21/2024, Discharged on 04/22/2024  Component Date Value Ref Range Status   WBC 04/21/2024 14.4 (H)  4.0 - 10.5 K/uL Final   RBC 04/21/2024 4.47  3.87 - 5.11 MIL/uL Final   Hemoglobin 04/21/2024 12.1  12.0 - 15.0 g/dL Final   HCT 91/85/7974 40.8  36.0 - 46.0 % Final   MCV 04/21/2024 91.3  80.0 - 100.0 fL Final   MCH 04/21/2024 27.1  26.0 - 34.0 pg Final   MCHC 04/21/2024 29.7 (L)  30.0 - 36.0 g/dL Final   RDW 91/85/7974 14.9  11.5 - 15.5 % Final  Platelets 04/21/2024 360  150 - 400 K/uL Final   nRBC 04/21/2024 0.0  0.0 - 0.2 % Final   Neutrophils Relative %  04/21/2024 63  % Final   Neutro Abs 04/21/2024 9.1 (H)  1.7 - 7.7 K/uL Final   Lymphocytes Relative 04/21/2024 21  % Final   Lymphs Abs 04/21/2024 3.0  0.7 - 4.0 K/uL Final   Monocytes Relative 04/21/2024 8  % Final   Monocytes Absolute 04/21/2024 1.2 (H)  0.1 - 1.0 K/uL Final   Eosinophils Relative 04/21/2024 6  % Final   Eosinophils Absolute 04/21/2024 0.9 (H)  0.0 - 0.5 K/uL Final   Basophils Relative 04/21/2024 1  % Final   Basophils Absolute 04/21/2024 0.1  0.0 - 0.1 K/uL Final   Immature Granulocytes 04/21/2024 1  % Final   Abs Immature Granulocytes 04/21/2024 0.12 (H)  0.00 - 0.07 K/uL Final   Sodium 04/21/2024 136  135 - 145 mmol/L Final   Potassium 04/21/2024 3.9  3.5 - 5.1 mmol/L Final   Chloride 04/21/2024 104  98 - 111 mmol/L Final   CO2 04/21/2024 23  22 - 32 mmol/L Final   Glucose, Bld 04/21/2024 159 (H)  70 - 99 mg/dL Final   BUN 91/85/7974 21 (H)  6 - 20 mg/dL Final   Creatinine, Ser 04/21/2024 0.98  0.44 - 1.00 mg/dL Final   Calcium  04/21/2024 8.3 (L)  8.9 - 10.3 mg/dL Final   Total Protein 91/85/7974 6.4 (L)  6.5 - 8.1 g/dL Final   Albumin 91/85/7974 3.2 (L)  3.5 - 5.0 g/dL Final   AST 91/85/7974 19  15 - 41 U/L Final   ALT 04/21/2024 20  0 - 44 U/L Final   Alkaline Phosphatase 04/21/2024 88  38 - 126 U/L Final   Total Bilirubin 04/21/2024 0.4  0.0 - 1.2 mg/dL Final   GFR, Estimated 04/21/2024 >60  >60 mL/min Final   Anion gap 04/21/2024 9  5 - 15 Final   Color, Urine 04/21/2024 YELLOW  YELLOW Final   APPearance 04/21/2024 CLOUDY (A)  CLEAR Final   Specific Gravity, Urine 04/21/2024 1.025  1.005 - 1.030 Final   pH 04/21/2024 5.0  5.0 - 8.0 Final   Glucose, UA 04/21/2024 NEGATIVE  NEGATIVE mg/dL Final   Hgb urine dipstick 04/21/2024 LARGE (A)  NEGATIVE Final   Bilirubin Urine 04/21/2024 NEGATIVE  NEGATIVE Final   Ketones, ur 04/21/2024 NEGATIVE  NEGATIVE mg/dL Final   Protein, ur 91/85/7974 100 (A)  NEGATIVE mg/dL Final   Nitrite 91/85/7974 NEGATIVE  NEGATIVE  Final   Leukocytes,Ua 04/21/2024 TRACE (A)  NEGATIVE Final   RBC / HPF 04/21/2024 >50  0 - 5 RBC/hpf Final   WBC, UA 04/21/2024 6-10  0 - 5 WBC/hpf Final   Bacteria, UA 04/21/2024 NONE SEEN  NONE SEEN Final   Squamous Epithelial / HPF 04/21/2024 21-50  0 - 5 /HPF Final   Mucus 04/21/2024 PRESENT   Final   Hyaline Casts, UA 04/21/2024 PRESENT   Final   Glucose-Capillary 04/21/2024 87  70 - 99 mg/dL Final   Glucose-Capillary 04/21/2024 109 (H)  70 - 99 mg/dL Final   HIV Screen 4th Generation wRfx 04/22/2024 Non Reactive  Non Reactive Final   Sodium 04/22/2024 136  135 - 145 mmol/L Final   Potassium 04/22/2024 4.2  3.5 - 5.1 mmol/L Final   Chloride 04/22/2024 103  98 - 111 mmol/L Final   CO2 04/22/2024 25  22 - 32 mmol/L Final   Glucose, Bld  04/22/2024 101 (H)  70 - 99 mg/dL Final   BUN 91/84/7974 20  6 - 20 mg/dL Final   Creatinine, Ser 04/22/2024 1.11 (H)  0.44 - 1.00 mg/dL Final   Calcium  04/22/2024 8.4 (L)  8.9 - 10.3 mg/dL Final   GFR, Estimated 04/22/2024 57 (L)  >60 mL/min Final   Anion gap 04/22/2024 8  5 - 15 Final   WBC 04/22/2024 11.6 (H)  4.0 - 10.5 K/uL Final   RBC 04/22/2024 3.90  3.87 - 5.11 MIL/uL Final   Hemoglobin 04/22/2024 10.6 (L)  12.0 - 15.0 g/dL Final   HCT 91/84/7974 35.5 (L)  36.0 - 46.0 % Final   MCV 04/22/2024 91.0  80.0 - 100.0 fL Final   MCH 04/22/2024 27.2  26.0 - 34.0 pg Final   MCHC 04/22/2024 29.9 (L)  30.0 - 36.0 g/dL Final   RDW 91/84/7974 15.0  11.5 - 15.5 % Final   Platelets 04/22/2024 320  150 - 400 K/uL Final   nRBC 04/22/2024 0.0  0.0 - 0.2 % Final   Glucose-Capillary 04/21/2024 131 (H)  70 - 99 mg/dL Final   Glucose-Capillary 04/22/2024 95  70 - 99 mg/dL Final   Glucose-Capillary 04/22/2024 155 (H)  70 - 99 mg/dL Final  Admission on 91/87/7974, Discharged on 04/19/2024  Component Date Value Ref Range Status   Glucose-Capillary 04/19/2024 152 (H)  70 - 99 mg/dL Final   Comment 1 91/87/7974 Notify RN   Final   Comment 2 04/19/2024 Document in  Chart   Final   Source 04/19/2024 Comment   Corrected   Color Calculi 04/19/2024 Brown   Final   Size Calculi 04/19/2024 <1  mm Final   Weight Calculi 04/19/2024 <1  mg Corrected   Calcium  [place] Monohydrate 04/19/2024 90  % Corrected   Calcium  [place] Dihydrate 04/19/2024 10  % Corrected   Glucose-Capillary 04/19/2024 137 (H)  70 - 99 mg/dL Final   Comment 1 91/87/7974 Document in Chart   Final  Hospital Outpatient Visit on 04/12/2024  Component Date Value Ref Range Status   Sodium 04/12/2024 138  135 - 145 mmol/L Final   Potassium 04/12/2024 4.2  3.5 - 5.1 mmol/L Final   Chloride 04/12/2024 103  98 - 111 mmol/L Final   CO2 04/12/2024 23  22 - 32 mmol/L Final   Glucose, Bld 04/12/2024 126 (H)  70 - 99 mg/dL Final   BUN 91/94/7974 16  6 - 20 mg/dL Final   Creatinine, Ser 04/12/2024 0.81  0.44 - 1.00 mg/dL Final   Calcium  04/12/2024 9.1  8.9 - 10.3 mg/dL Final   GFR, Estimated 04/12/2024 >60  >60 mL/min Final   Anion gap 04/12/2024 12  5 - 15 Final   WBC 04/12/2024 14.4 (H)  4.0 - 10.5 K/uL Final   RBC 04/12/2024 4.55  3.87 - 5.11 MIL/uL Final   Hemoglobin 04/12/2024 12.9  12.0 - 15.0 g/dL Final   HCT 91/94/7974 40.5  36.0 - 46.0 % Final   MCV 04/12/2024 89.0  80.0 - 100.0 fL Final   MCH 04/12/2024 28.4  26.0 - 34.0 pg Final   MCHC 04/12/2024 31.9  30.0 - 36.0 g/dL Final   RDW 91/94/7974 14.5  11.5 - 15.5 % Final   Platelets 04/12/2024 439 (H)  150 - 400 K/uL Final   nRBC 04/12/2024 0.0  0.0 - 0.2 % Final   Glucose-Capillary 04/12/2024 142 (H)  70 - 99 mg/dL Final  Admission on 92/89/7974, Discharged on 03/17/2024  Component  Date Value Ref Range Status   Glucose-Capillary 03/17/2024 165 (H)  70 - 99 mg/dL Final   Comment 1 92/89/7974 Notify RN   Final   Comment 2 03/17/2024 Document in Chart   Final   Glucose-Capillary 03/17/2024 82  70 - 99 mg/dL Final  Hospital Outpatient Visit on 03/07/2024  Component Date Value Ref Range Status   Hgb A1c MFr Bld 03/07/2024 7.9 (H)  4.8 -  5.6 % Final   Mean Plasma Glucose 03/07/2024 180.03  mg/dL Final   Sodium 93/69/7974 136  135 - 145 mmol/L Final   Potassium 03/07/2024 3.9  3.5 - 5.1 mmol/L Final   Chloride 03/07/2024 104  98 - 111 mmol/L Final   CO2 03/07/2024 22  22 - 32 mmol/L Final   Glucose, Bld 03/07/2024 123 (H)  70 - 99 mg/dL Final   BUN 93/69/7974 13  6 - 20 mg/dL Final   Creatinine, Ser 03/07/2024 0.85  0.44 - 1.00 mg/dL Final   Calcium  03/07/2024 9.2  8.9 - 10.3 mg/dL Final   GFR, Estimated 03/07/2024 >60  >60 mL/min Final   Anion gap 03/07/2024 10  5 - 15 Final   WBC 03/07/2024 19.2 (H)  4.0 - 10.5 K/uL Final   RBC 03/07/2024 4.97  3.87 - 5.11 MIL/uL Final   Hemoglobin 03/07/2024 14.0  12.0 - 15.0 g/dL Final   HCT 93/69/7974 44.1  36.0 - 46.0 % Final   MCV 03/07/2024 88.7  80.0 - 100.0 fL Final   MCH 03/07/2024 28.2  26.0 - 34.0 pg Final   MCHC 03/07/2024 31.7  30.0 - 36.0 g/dL Final   RDW 93/69/7974 14.2  11.5 - 15.5 % Final   Platelets 03/07/2024 323  150 - 400 K/uL Final   nRBC 03/07/2024 0.0  0.0 - 0.2 % Final   Glucose-Capillary 03/07/2024 142 (H)  70 - 99 mg/dL Final  Admission on 93/93/7974, Discharged on 02/12/2024  Component Date Value Ref Range Status   WBC 02/12/2024 12.8 (H)  4.0 - 10.5 K/uL Final   RBC 02/12/2024 5.10  3.87 - 5.11 MIL/uL Final   Hemoglobin 02/12/2024 14.7  12.0 - 15.0 g/dL Final   HCT 93/93/7974 46.5 (H)  36.0 - 46.0 % Final   MCV 02/12/2024 91.2  80.0 - 100.0 fL Final   MCH 02/12/2024 28.8  26.0 - 34.0 pg Final   MCHC 02/12/2024 31.6  30.0 - 36.0 g/dL Final   RDW 93/93/7974 14.5  11.5 - 15.5 % Final   Platelets 02/12/2024 405 (H)  150 - 400 K/uL Final   nRBC 02/12/2024 0.0  0.0 - 0.2 % Final   Neutrophils Relative % 02/12/2024 58  % Final   Neutro Abs 02/12/2024 7.5  1.7 - 7.7 K/uL Final   Lymphocytes Relative 02/12/2024 28  % Final   Lymphs Abs 02/12/2024 3.6  0.7 - 4.0 K/uL Final   Monocytes Relative 02/12/2024 6  % Final   Monocytes Absolute 02/12/2024 0.8  0.1 -  1.0 K/uL Final   Eosinophils Relative 02/12/2024 6  % Final   Eosinophils Absolute 02/12/2024 0.7 (H)  0.0 - 0.5 K/uL Final   Basophils Relative 02/12/2024 1  % Final   Basophils Absolute 02/12/2024 0.1  0.0 - 0.1 K/uL Final   Immature Granulocytes 02/12/2024 1  % Final   Abs Immature Granulocytes 02/12/2024 0.07  0.00 - 0.07 K/uL Final   Color, Urine 02/12/2024 YELLOW  YELLOW Final   APPearance 02/12/2024 CLOUDY (A)  CLEAR Final  Specific Gravity, Urine 02/12/2024 1.026  1.005 - 1.030 Final   pH 02/12/2024 5.0  5.0 - 8.0 Final   Glucose, UA 02/12/2024 NEGATIVE  NEGATIVE mg/dL Final   Hgb urine dipstick 02/12/2024 NEGATIVE  NEGATIVE Final   Bilirubin Urine 02/12/2024 NEGATIVE  NEGATIVE Final   Ketones, ur 02/12/2024 NEGATIVE  NEGATIVE mg/dL Final   Protein, ur 93/93/7974 30 (A)  NEGATIVE mg/dL Final   Nitrite 93/93/7974 NEGATIVE  NEGATIVE Final   Leukocytes,Ua 02/12/2024 SMALL (A)  NEGATIVE Final   RBC / HPF 02/12/2024 0-5  0 - 5 RBC/hpf Final   WBC, UA 02/12/2024 11-20  0 - 5 WBC/hpf Final   Bacteria, UA 02/12/2024 RARE (A)  NONE SEEN Final   Squamous Epithelial / HPF 02/12/2024 11-20  0 - 5 /HPF Final   Mucus 02/12/2024 PRESENT   Final   Sodium 02/12/2024 140  135 - 145 mmol/L Final   Potassium 02/12/2024 3.3 (L)  3.5 - 5.1 mmol/L Final   Chloride 02/12/2024 102  98 - 111 mmol/L Final   BUN 02/12/2024 7  6 - 20 mg/dL Final   Creatinine, Ser 02/12/2024 0.90  0.44 - 1.00 mg/dL Final   Glucose, Bld 93/93/7974 89  70 - 99 mg/dL Final   Calcium , Ion 02/12/2024 1.17  1.15 - 1.40 mmol/L Final   TCO2 02/12/2024 28  22 - 32 mmol/L Final   Hemoglobin 02/12/2024 13.6  12.0 - 15.0 g/dL Final   HCT 93/93/7974 40.0  36.0 - 46.0 % Final   Sodium 02/12/2024 139  135 - 145 mmol/L Final   Potassium 02/12/2024 3.5  3.5 - 5.1 mmol/L Final   Chloride 02/12/2024 104  98 - 111 mmol/L Final   CO2 02/12/2024 25  22 - 32 mmol/L Final   Glucose, Bld 02/12/2024 92  70 - 99 mg/dL Final   BUN 93/93/7974 9   6 - 20 mg/dL Final   Creatinine, Ser 02/12/2024 0.82  0.44 - 1.00 mg/dL Final   Calcium  02/12/2024 9.1  8.9 - 10.3 mg/dL Final   Total Protein 93/93/7974 7.2  6.5 - 8.1 g/dL Final   Albumin 93/93/7974 3.8  3.5 - 5.0 g/dL Final   AST 93/93/7974 25  15 - 41 U/L Final   ALT 02/12/2024 28  0 - 44 U/L Final   Alkaline Phosphatase 02/12/2024 82  38 - 126 U/L Final   Total Bilirubin 02/12/2024 0.7  0.0 - 1.2 mg/dL Final   GFR, Estimated 02/12/2024 >60  >60 mL/min Final   Anion gap 02/12/2024 10  5 - 15 Final   Lipase 02/12/2024 28  11 - 51 U/L Final  Admission on 11/25/2023, Discharged on 11/25/2023  Component Date Value Ref Range Status   Color, Urine 11/25/2023 YELLOW  YELLOW Final   APPearance 11/25/2023 HAZY (A)  CLEAR Final   Specific Gravity, Urine 11/25/2023 1.042 (H)  1.005 - 1.030 Final   pH 11/25/2023 5.5  5.0 - 8.0 Final   Glucose, UA 11/25/2023 500 (A)  NEGATIVE mg/dL Final   Hgb urine dipstick 11/25/2023 NEGATIVE  NEGATIVE Final   Bilirubin Urine 11/25/2023 NEGATIVE  NEGATIVE Final   Ketones, ur 11/25/2023 TRACE (A)  NEGATIVE mg/dL Final   Protein, ur 96/80/7974 100 (A)  NEGATIVE mg/dL Final   Nitrite 96/80/7974 NEGATIVE  NEGATIVE Final   Leukocytes,Ua 11/25/2023 TRACE (A)  NEGATIVE Final   RBC / HPF 11/25/2023 0-5  0 - 5 RBC/hpf Final   WBC, UA 11/25/2023 6-10  0 - 5 WBC/hpf Final  Bacteria, UA 11/25/2023 RARE (A)  NONE SEEN Final   Squamous Epithelial / HPF 11/25/2023 11-20  0 - 5 /HPF Final   Mucus 11/25/2023 PRESENT   Final   Hyaline Casts, UA 11/25/2023 PRESENT   Final   Sodium 11/25/2023 134 (L)  135 - 145 mmol/L Final   Potassium 11/25/2023 4.0  3.5 - 5.1 mmol/L Final   Chloride 11/25/2023 99  98 - 111 mmol/L Final   CO2 11/25/2023 22  22 - 32 mmol/L Final   Glucose, Bld 11/25/2023 343 (H)  70 - 99 mg/dL Final   BUN 96/80/7974 14  6 - 20 mg/dL Final   Creatinine, Ser 11/25/2023 0.94  0.44 - 1.00 mg/dL Final   Calcium  11/25/2023 8.7 (L)  8.9 - 10.3 mg/dL Final    GFR, Estimated 11/25/2023 >60  >60 mL/min Final   Anion gap 11/25/2023 13  5 - 15 Final   WBC 11/25/2023 14.0 (H)  4.0 - 10.5 K/uL Final   RBC 11/25/2023 4.50  3.87 - 5.11 MIL/uL Final   Hemoglobin 11/25/2023 13.0  12.0 - 15.0 g/dL Final   HCT 96/80/7974 39.1  36.0 - 46.0 % Final   MCV 11/25/2023 86.9  80.0 - 100.0 fL Final   MCH 11/25/2023 28.9  26.0 - 34.0 pg Final   MCHC 11/25/2023 33.2  30.0 - 36.0 g/dL Final   RDW 96/80/7974 14.0  11.5 - 15.5 % Final   Platelets 11/25/2023 349  150 - 400 K/uL Final   nRBC 11/25/2023 0.0  0.0 - 0.2 % Final  Community Documentation on 02/24/2023  Component Date Value Ref Range Status   Glucose Fasting, POC 02/24/2023 130 (A)  70 - 99 mg/dL Final   Hemoglobin J8R 02/24/2023 6.8   Final  Office Visit on 01/05/2023  Component Date Value Ref Range Status   WBC 01/05/2023 11.7 (H)  4.0 - 10.5 K/uL Final   RBC 01/05/2023 4.87  3.87 - 5.11 Mil/uL Final   Hemoglobin 01/05/2023 13.8  12.0 - 15.0 g/dL Final   HCT 95/70/7975 42.3  36.0 - 46.0 % Final   MCV 01/05/2023 86.7  78.0 - 100.0 fl Final   MCHC 01/05/2023 32.6  30.0 - 36.0 g/dL Final   RDW 95/70/7975 15.2  11.5 - 15.5 % Final   Platelets 01/05/2023 345.0  150.0 - 400.0 K/uL Final   Neutrophils Relative % 01/05/2023 66.2  43.0 - 77.0 % Final   Lymphocytes Relative 01/05/2023 20.5  12.0 - 46.0 % Final   Monocytes Relative 01/05/2023 6.0  3.0 - 12.0 % Final   Eosinophils Relative 01/05/2023 6.1 (H)  0.0 - 5.0 % Final   Basophils Relative 01/05/2023 1.2  0.0 - 3.0 % Final   Neutro Abs 01/05/2023 7.7  1.4 - 7.7 K/uL Final   Lymphs Abs 01/05/2023 2.4  0.7 - 4.0 K/uL Final   Monocytes Absolute 01/05/2023 0.7  0.1 - 1.0 K/uL Final   Eosinophils Absolute 01/05/2023 0.7  0.0 - 0.7 K/uL Final   Basophils Absolute 01/05/2023 0.1  0.0 - 0.1 K/uL Final   Sodium 01/05/2023 138  135 - 145 mEq/L Final   Potassium 01/05/2023 4.1  3.5 - 5.1 mEq/L Final   Chloride 01/05/2023 104  96 - 112 mEq/L Final   CO2  01/05/2023 24  19 - 32 mEq/L Final   Glucose, Bld 01/05/2023 110 (H)  70 - 99 mg/dL Final   BUN 95/70/7975 16  6 - 23 mg/dL Final   Creatinine, Ser 01/05/2023 0.87  0.40 - 1.20 mg/dL Final   Total Bilirubin 01/05/2023 0.3  0.2 - 1.2 mg/dL Final   Alkaline Phosphatase 01/05/2023 93  39 - 117 U/L Final   AST 01/05/2023 18  0 - 37 U/L Final   ALT 01/05/2023 20  0 - 35 U/L Final   Total Protein 01/05/2023 6.8  6.0 - 8.3 g/dL Final   Albumin 95/70/7975 4.0  3.5 - 5.2 g/dL Final   GFR 95/70/7975 72.82  >60.00 mL/min Final   Calcium  01/05/2023 9.2  8.4 - 10.5 mg/dL Final   Cholesterol 95/70/7975 201 (H)  0 - 200 mg/dL Final   Triglycerides 95/70/7975 149.0  0.0 - 149.0 mg/dL Final   HDL 95/70/7975 67.40  >39.00 mg/dL Final   VLDL 95/70/7975 29.8  0.0 - 40.0 mg/dL Final   LDL Cholesterol 01/05/2023 104 (H)  0 - 99 mg/dL Final   Total CHOL/HDL Ratio 01/05/2023 3   Final   NonHDL 01/05/2023 133.34   Final   TSH 01/05/2023 2.66  0.35 - 5.50 uIU/mL Final  No image results found. No results found.       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 Gabapentin  is continued for neuropathy, and a switch to Mounjaro  is planned for improved glycemic control and weight management, pending insurance approval. Binge eating disorder, unspecified severity Managed effectively with Vyvanse  70 mg, which will be continued. Bipolar 2 disorder (HCC) Well-managed on the current medication regimen, including Rexulti , Tramadol, and lamotrigine , which will be continued. Dyslipidemia associated with type 2 diabetes mellitus (HCC) Assessment: We reviewed the patient's last  lipid panel results and discussed their understanding of cholesterol and its role in  cardiovascular disease (CVD).   We encouraged the patient to improve their lipid profile as much as possible.  Plan: Diet and Exercise: We collaboratively developed a plan to optimize diet and exercise habits for cholesterol management.  Shared decision-making done; patient understood rationale and agreed to labwork Gastroesophageal reflux disease without esophagitis Symptoms are controlled with daily Nexium, which will be continued. Hypertension associated with diabetes (HCC) Well-controlled Reviewed available data from patient and  BP Readings from Last 3 Encounters:  09/26/24 126/80  06/03/24 139/80  04/28/24 115/68  continues on, as of 09/26/2024 Current hypertension medications:       Sig   lisinopril  (ZESTRIL ) 5 MG tablet (Taking) TAKE 1 TABLET(5 MG) BY MOUTH DAILY      Leukocytosis, unspecified type  Following with hematology and no concern(s) after bcr abl and jak and smear testing, chronic Lung nodule The nodule has increased from 3 mm in 2016 to 6 mm in August. Despite a smoking history, she has not used tobacco since 2006. A high-resolution CT scan of the chest is ordered to assess growth. Morbid obesity with BMI of 50.0-59.9, adult (HCC) She has experienced significant weight loss on Ozempic  but suffers from nausea, vomiting, and diarrhea at the current 2 mg dose. A switch to Mounjaro  is considered for its better efficacy and reduced nausea, pending insurance approval. Surgery is a potential option if Mounjaro  is not approved. Compazine  is prescribed for nausea management. Mounjaro  has been ordered, and an appeal for insurance approval will be made. She is referred to a nutritionist. Nephrolithiasis Surgical removal of left sided kidney stone Vitamin D  deficiency Shared decision-making done; patient  understood rationale and agreed to labwork  Hematuria, unspecified type There is concern due to her smoking history and potential bladder cancer risk, though no postmenopausal  bleeding is reported. Urine tests are ordered. Nausea Persistent nausea and vomiting suggest gastroparesis. A gastric emptying study is ordered to confirm the diagnosis. Reglan will be considered if gastroparesis is confirmed.   ORDER ASSOCIATIONS  #   DIAGNOSIS / CONDITION ICD-10 ENCOUNTER ORDER     ICD-10-CM   1. Type 2 diabetes mellitus with diabetic neuropathy, with long-term current use of insulin  (HCC)  E11.40 tirzepatide  (MOUNJARO ) 2.5 MG/0.5ML Pen   Z79.4 NM Gastric Emptying    CBC with Differential/Platelet    Comprehensive metabolic panel with GFR    Lipid panel    Hemoglobin A1c    Microalbumin / creatinine urine ratio    2. Mild intermittent chronic asthma without complication  J45.20     3. Binge eating disorder, unspecified severity  F50.819     4. Bipolar 2 disorder (HCC)  F31.81     5. Diabetic peripheral neuropathy (HCC)  E11.42 Amb ref to Medical Nutrition Therapy-MNT    6. 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 Dyslipidemia associated with type 2 diabetes mellitus (HCC)  E11.69    E78.5     8. Gastroesophageal reflux disease without esophagitis  K21.9     9. Hypertension associated with diabetes (HCC)  E11.59    I15.2     10. Leukocytosis, unspecified type  D72.829     11. Lung nodule  R91.1 CT Chest Wo Contrast    12. Morbid obesity with BMI of 50.0-59.9, adult (HCC)  E66.01    Z68.43     13. Nephrolithiasis  N20.0     14. 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 Vitamin D  deficiency  E55.9 Vitamin D  (25 hydroxy)    16. Hematuria, unspecified type  R31.9 Ambulatory referral to Urology    Cytology - non gyn    Urinalysis w microscopic + reflex cultur    17. Nausea  R11.0 prochlorperazine  (COMPAZINE ) 10 MG tablet    NM Gastric Emptying           Orders Placed in Encounter:   Lab Orders         CBC with Differential/Platelet         Comprehensive metabolic panel with GFR         Lipid panel         Hemoglobin  A1c         Microalbumin / creatinine urine ratio         Vitamin D  (25 hydroxy)         Urinalysis w microscopic + reflex cultur     Imaging Orders         NM Gastric Emptying         CT Chest Wo Contrast     Referral Orders         Amb ref to Medical Nutrition Therapy-MNT         Ambulatory referral to Urology     Meds ordered this encounter  Medications   tirzepatide  (MOUNJARO ) 2.5 MG/0.5ML Pen    Sig: Inject 2.5 mg into the skin once a week.    Dispense:  2 mL    Refill:  11   prochlorperazine  (COMPAZINE ) 10 MG tablet    Sig: Take 1 tablet (10 mg total) by mouth every 6 (six)  hours as needed for nausea or vomiting.    Dispense:  30 tablet    Refill:  0        This document was synthesized by artificial intelligence ([employer]bridge) using HIPAA-compliant recording of the clinical interaction;   We discussed the use of AI scribe software for clinical note transcription with the patient, who gave verbal consent to proceed. additional Info: This encounter employed state-of-the-art, real-time, collaborative documentation. The patient actively reviewed and assisted in updating their electronic medical record on a shared screen, ensuring transparency and facilitating joint problem-solving for the problem list, overview, and plan. This approach promotes accurate, informed care. The treatment plan was discussed and reviewed in detail, including medication safety, potential side effects, and all patient questions. We confirmed understanding and comfort with the plan. Follow-up instructions were established, including contacting the office for any concerns, returning if symptoms worsen, persist, or new symptoms develop, and precautions for potential emergency department visits.

## 2024-09-26 NOTE — Assessment & Plan Note (Signed)
 Assessment: We reviewed the patient's last  lipid panel results and discussed their understanding of cholesterol and its role in cardiovascular disease (CVD).   We encouraged the patient to improve their lipid profile as much as possible.  Plan: Diet and Exercise: We collaboratively developed a plan to optimize diet and exercise habits for cholesterol management.  Shared decision-making done; patient understood rationale and agreed to [facility]

## 2024-09-26 NOTE — Assessment & Plan Note (Signed)
 Shared decision-making done; patient understood rationale and agreed to [facility]

## 2024-09-26 NOTE — Telephone Encounter (Signed)
 Pharmacy Patient Advocate Encounter   Received notification from [employer] CMM KEY that prior authorization for Mounjaro  2.5MG /0.5ML Pen injectors is required/requested.   Insurance verification completed.   The patient is insured through [facility] MEDICAID.   Per test claim:  See list below is preferred by the insurance.  If suggested medication is appropriate, Please send in a new RX and discontinue this one. If not, please advise as to why it's not appropriate so that we may request a Prior Authorization. Please note, some preferred medications may still require a PA.  If the suggested medications have not been trialed and there are no contraindications to their use, the PA will not be submitted, as it will not be approved. Archived Key: [facility] fax no key

## 2024-09-26 NOTE — Assessment & Plan Note (Signed)
 Her diabetes is complicated by peripheral neuropathy and suspected gastroparesis. Gabapentin  is continued for neuropathy, and a switch to Mounjaro  is planned for improved glycemic control and weight management, pending insurance approval.

## 2024-09-26 NOTE — Assessment & Plan Note (Signed)
 The nodule has increased from 3 mm in 2016 to 6 mm in August. Despite a smoking history, she has not used tobacco since 2006. A high-resolution CT scan of the chest is ordered to assess growth.

## 2024-09-26 NOTE — Assessment & Plan Note (Signed)
 Surgical removal of left sided kidney stone

## 2024-09-26 NOTE — Patient Instructions (Addendum)
 VISIT SUMMARY: During your visit, we discussed your significant weight loss, nausea, and vomiting while on Ozempic . We also reviewed your type 2 diabetes management, suspected gastroparesis, binge eating disorder, bipolar II disorder, gastroesophageal reflux disease, hematuria, and lung nodule.  YOUR PLAN: -MORBID OBESITY WITH MEDICATION-INDUCED NAUSEA AND VOMITING: You have experienced significant weight loss on Ozempic  but are suffering from nausea, vomiting, and diarrhea at the current dose. We are considering switching you to Mounjaro  for better efficacy and reduced nausea, pending insurance approval. If Mounjaro  is not approved, surgery may be an option. Compazine  is prescribed to help manage your nausea. You are also referred to a nutritionist.  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 We will continue your gabapentin  for neuropathy and plan to switch you to Mounjaro  for better blood sugar control and weight management, pending insurance approval.  -SUSPECTED DIABETIC GASTROPARESIS: Your persistent nausea and vomiting suggest gastroparesis, a condition where the stomach takes too long to empty its contents. We have ordered a gastric emptying study to confirm this diagnosis. If confirmed, we may consider Reglan for treatment.  -BINGE EATING DISORDER: Your binge eating disorder is being effectively managed with Vyvanse  70 mg, which you will continue taking.  -BIPOLAR II DISORDER: Your bipolar II disorder is well-managed with your current medications, including Rexulti , Tramadol, and lamotrigine , which you will continue taking.  -GASTROESOPHAGEAL REFLUX DISEASE: Your reflux symptoms are controlled with daily Nexium, which you will continue taking.  -HEMATURIA: Hematuria means blood in the urine. Given your smoking history and potential bladder cancer risk, we have ordered urine tests to investigate  further.  -LUNG NODULE: The lung nodule has increased in size from 3 mm in 2016 to 6 mm in August. Despite your smoking history, you have not used tobacco since 2006. We have ordered a high-resolution CT scan of your chest to assess the growth.  INSTRUCTIONS: We will follow up on the insurance approval for Mounjaro  and the results of your gastric emptying study, urine tests, and high-resolution CT scan. Please schedule an appointment with the nutritionist as soon as possible.  Building Your Long-Term Health Plan  During today's preventive visit, we covered a variety of important health checks to help you stay on top of your well-being.  We also discussed strategies to maintain your health and identified some areas that might benefit from further exploration.   Preventive care visits like today's are designed to be proactive, but sometimes additional attention may be needed.  Rest assured, we're here for you.  If these areas require further evaluation or management, we'd be happy to schedule a separate, focused appointment to address them in detail.  Addressing Next Steps  [x]   Follow-up Visit: To ensure we address any unresolved issues and continue monitoring your overall health, we recommend scheduling a follow-up appointment in 1 year for your next preventive care visit. If you experience any new problems, need to discuss any medical concerns, or your condition worsens before then, please don't hesitate to call our office to schedule an appointment or seek emergency care as needed.  [x]   Preventive Measures: Maintaining healthy habits plays a crucial role in overall wellness. We recommend considering these tips: [x]   Regular appointments with dental and vision professionals [x]   Nightly nasal saline mist to keep sinuses clear [x]   Consistent toothbrushing to maintain oral health [x]   Using an app like SnoreLab to track sleep quality [x]   Routine checks of blood pressure and  heart rate [x]    Medical Information: In some instances, we may require additional medical information from other providers to create a comprehensive picture of your health. If applicable, we can provide a medical information release form at the front desk for you to sign, allowing us  to gather these records. [x]   Lab Tests: If any lab tests were ordered today, scheduling them within a week of your visit helps ensure the best possible insurance coverage.  Planning Follow Up to Work on a Problem? Make the Most of Our Focused (20 minute) Appointments  [x]   Clearly state your top concerns at the beginning of the visit to focus our discussion [x]   If you anticipate you will need more time, please inform the front desk during scheduling - we can book multiple appointments in the same week. [x]   If you have transportation problems- use our convenient video appointments or ask about transportation support. [x]   We can get down to business faster if you use MyChart to update information before the visit and submit non-urgent questions before your visit. Thank you for taking the time to provide details through MyChart.  Let our nurse know and she can import this information into your encounter documents.  Arrival and Wait Times  [x]   Arriving on time ensures that everyone receives prompt attention. [x]   Early morning (8a) and afternoon (1p) appointments tend to have shortest wait times. [x]   Unfortunately, we cannot delay appointments for late arrivals or hold slots during phone calls.  Bring to Your Next Appointment:  [x]   Medications: Please bring all your medication bottles to your next appointment to ensure we have an accurate record of your prescriptions. [x]   Health Diaries: If you're monitoring any health conditions at home, keeping a diary of your readings can be very helpful for discussions at your next appointment.  Reviewing Your Records  [x]   Review your attached preventive care information at the end of  these patient instructions. [x]   Review this early draft of your clinical encounter notes below and the final encounter summary tomorrow on MyChart after its been completed.      Getting Answers and Following Up  [x]   Simple Questions & Concerns: For quick questions or basic follow-up after your visit, reach us  at [phone] or MyChart messaging. [x]   Complex Concerns: If your concern is more complex, scheduling an appointment might be best. Discuss this with the staff to find the most suitable option. [x]   Lab & Imaging Results: We'll contact you directly if results are abnormal or you don't use MyChart. Most normal results will be on MyChart within 2-3 business days, with a review message from Dr. Jesus. Haven't heard back in 2 weeks? Need results sooner? Contact us  at [phone]. [x]   Referrals: Our referral coordinator will manage specialist referrals. The specialist's office should contact you within 2 weeks to schedule an appointment. Call us  if you haven't heard from them after 2 weeks.  Staying Connected  [x]   MyChart: Activate your MyChart for the fastest way to access results and message us . See the last page of this paperwork for instructions on how to activate.  Billing  [x]   X-ray & Lab Orders: These are billed by separate companies. Contact the invoicing company directly for questions or concerns. [x]   Visit Charges: Discuss any billing inquiries with our administrative services team.  Your Satisfaction Matters  [x]   Share Your Experience: We strive for your satisfaction! If you have any complaints, or preferably compliments, please  let Dr. Jesus know directly or contact our Practice Administrators, Manuelita Rubin or [employer], by asking at the front desk.                 Next Steps  [x]   Schedule Follow-Up:  We recommend a follow-up appointment in 1 year for your next wellness visit.  If you develop any new problems, want to address any medical  issues, or your condition worsens before then, please call us  for an appointment or seek emergency care. [x]   Preventive Care:  Make sure to keep regular appointments with dental and vision professionals, use nightly nasal saline mist sprays to keep your sinuses clear and toothbrushing to protect your teeth. Use SnoreLab App or other app to track your sleep quality. Check blood pressure and heart rate routinely. [x]   Medical Information Release:  For any relevant medical information we don't have, please sign a release form at the front desk so we can obtain it for your records. [x]   Lab Tests:  Schedule any lab tests from today for within a week to ensure best insurance coverage.    Making the Most of Our Focused (20 minute) Appointments:  [x]   Clearly state your top concerns at the beginning of the visit to focus our discussion [x]   If you anticipate you will need more time, please inform the front desk during scheduling - we can book multiple appointments in the same week. [x]   If you have transportation problems- use our convenient video appointments or ask about transportation support. [x]   We can get down to business faster if you use MyChart to update information before the visit and submit non-urgent questions before your visit. Thank you for taking the time to provide details through MyChart.  Let our nurse know and she can import this information into your encounter documents.  Arrival and Wait Times: [x]   Arriving on time ensures that everyone receives prompt attention. [x]   Early morning (8a) and afternoon (1p) appointments tend to have shortest wait times. [x]   Unfortunately, we cannot delay appointments for late arrivals or hold slots during phone calls.  Bring to Your Next Appointment  [x]   Medications: Please bring all your medication bottles to your next appointment to ensure we have an accurate record of your prescriptions. [x]   Health Diaries: If you're monitoring any health  conditions at home, keeping a diary of your readings can be very helpful for discussions at your next appointment.  Reviewing Your Records  [x]   Review your attached preventive care information at the end of these patient instructions. [x]   Review this early draft of your clinical encounter notes below and the final encounter summary tomorrow on MyChart after its been completed.   Type 2 diabetes mellitus with diabetic neuropathy, with long-term current use of insulin  (HCC) -     Tirzepatide ; Inject 2.5 mg into the skin once a week.  Dispense: 2 mL; Refill: 11 -     NM GASTRIC EMPTYING; Future -     CBC with Differential/Platelet -     Comprehensive metabolic panel with GFR -     Lipid panel -     Hemoglobin A1c -     Microalbumin / creatinine urine ratio  Binge eating disorder, unspecified severity  Bipolar 2 disorder (HCC)  Diabetic peripheral neuropathy (HCC) -     Amb ref to Medical Nutrition Therapy-MNT  Dyslipidemia associated with type 2 diabetes mellitus (HCC)  Gastroesophageal reflux disease without esophagitis  Hypertension associated with  diabetes (HCC)  Leukocytosis, unspecified type  Lung nodule -     CT CHEST WO CONTRAST; Future  Morbid obesity with BMI of 50.0-59.9, adult (HCC)  Nephrolithiasis  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  Dispense: 30 tablet; Refill: 0 -     NM GASTRIC EMPTYING; Future     Getting Answers and Following Up  [x]   Simple Questions & Concerns: For quick questions or basic follow-up after your visit, reach us  at [phone] or MyChart messaging. [x]   Complex Concerns: If  your concern is more complex, scheduling an appointment might be best. Discuss this with the staff to find the most suitable option. [x]   Lab & Imaging Results: We'll contact you directly if results are abnormal or you don't use MyChart. Most normal results will be on MyChart within 2-3 business days, with a review message from Dr. Jesus. Haven't heard back in 2 weeks? Need results sooner? Contact us  at [phone]. [x]   Referrals: Our referral coordinator will manage specialist referrals. The specialist's office should contact you within 2 weeks to schedule an appointment. Call us  if you haven't heard from them after 2 weeks.  Staying Connected  [x]   MyChart: Activate your MyChart for the fastest way to access results and message us . See the last page of this paperwork for instructions on how to activate.  Billing  [x]   X-ray & Lab Orders: These are billed by separate companies. Contact the invoicing company directly for questions or concerns. [x]   Visit Charges: Discuss any billing inquiries with our administrative services team.  Your Satisfaction Matters  [x]   Share Your Experience: We strive for your satisfaction! If you have any complaints, or preferably compliments, please let Dr. Jesus know directly or contact our Practice Administrators, Manuelita Rubin or [employer], by asking at the front desk.    Medical Screening Exam A medical screening exam (MSE) helps to determine whether you need immediate medical treatment relating to any number of symptoms you are having. This type of exam may be done in an emergency department, an urgent care setting, or your health care provider's office. Depending on your symptoms and severity, you may need additional tests or medical therapy. It is important to note that an MSE does not necessarily mean that you will need or receive further medical testing or interventions if your symptoms are not deemed to be medically urgent (emergent). Tell  a health care provider about: Any allergies you have. All medicines you are taking, including vitamins, herbs, eye drops, creams, and over-the-counter medicines. Any problems you or family members have had with anesthetic medicines. Any bleeding problems you have. Any surgeries you have had. Any medical conditions you have. Whether you are pregnant or may be pregnant. What happens during the test? During the exam, a health care provider does a short, often focused, physical exam and asks about your medical history to assess: Your current symptoms. Your overall health. Your need for possible further medical intervention. What can I expect after the test? If you have a regular health care  provider, make an appointment for a follow-up visit with him or her. If you do not have a regular health care provider, ask about resources in your community. Your medical screening exam may determine that: You do not need emergency treatment at this time. You need treatment right away. You need to be transferred to another medical center. This may happen if you need an emergent specialist or consultant that is not available at the medical center you are at. You need to have more tests. A medical specialist may be consulted if needed. Get help right away if: Your condition gets worse. You develop new or troubling symptoms before you see your health care provider. These symptoms may represent a serious problem that is an emergency. Do not wait to see if the symptoms will go away. Get medical help right away. Call your local emergency services (911 in the [place] Do not drive yourself to the hospital. Summary A medical screening exam helps to determine whether you need medical treatment right away. This type of exam may be done in an emergency department, an urgent care setting, or your health care provider's office. During the exam, a health care provider does a short physical exam and asks about your current  symptoms and overall health. Depending on the exam, more tests or therapies may be ordered. However, an MSE does not necessarily mean that you will have further medical testing if your symptoms are not deemed to be urgent. If you need further care that is not offered at your current medical center, you may need to be transferred to another facility. This information is not intended to replace advice given to you by your health care provider. Make sure you discuss any questions you have with your health care provider. Document Revised: 05/08/2021 Document Reviewed: 01/03/2021 Elsevier Patient Education  2024 [employer].

## 2024-09-27 LAB — URINALYSIS W MICROSCOPIC + REFLEX CULTURE
Bacteria, UA: NONE SEEN /HPF
Bilirubin Urine: NEGATIVE
Glucose, UA: NEGATIVE
Hgb urine dipstick: NEGATIVE
Hyaline Cast: NONE SEEN /LPF
Ketones, ur: NEGATIVE
Leukocyte Esterase: NEGATIVE
Nitrites, Initial: NEGATIVE
Protein, ur: NEGATIVE
RBC / HPF: NONE SEEN /HPF (ref 0–2)
Specific Gravity, Urine: 1.009 (ref 1.001–1.035)
pH: 5.5 (ref 5.0–8.0)

## 2024-09-27 LAB — NO CULTURE INDICATED

## 2024-09-28 MED ORDER — TRULICITY 0.75 MG/0.5ML ~~LOC~~ SOAJ: 0.7500 mg | SUBCUTANEOUS | 2 refills | Status: AC

## 2024-09-28 NOTE — Telephone Encounter (Signed)
-   She will need to try at least two of the preferred alternatives.

## 2024-09-28 NOTE — Addendum Note (Signed)
 Addended by: Ajanay Farve G on: 09/28/2024 10:36 AM   Modules accepted: Orders

## 2024-09-28 NOTE — Telephone Encounter (Signed)
 read by Ellouise [name] at 10:52AM on 09/28/2024.

## 2024-10-02 ENCOUNTER — Ambulatory Visit: Payer: Self-pay | Admitting: Internal Medicine

## 2024-10-03 ENCOUNTER — Telehealth: Payer: Self-pay

## 2024-10-03 NOTE — Telephone Encounter (Signed)
 Pharmacy Patient Advocate Encounter   Received notification from [employer] that prior authorization for Trulicity  0.75MG /0.5ML auto-injectors is required/requested.   Insurance verification completed.   The patient is insured through [employer] [facility] MEDICAID.   Per test claim: PA required; PA submitted to above mentioned insurance via Latent Key/confirmation #/[facility] Status is pending

## 2024-10-04 ENCOUNTER — Other Ambulatory Visit (HOSPITAL_COMMUNITY): Payer: Self-pay

## 2024-10-04 NOTE — Telephone Encounter (Signed)
 Pharmacy Patient Advocate Encounter  Received notification from [employer] that Prior Authorization for Trulicity  0.75MG /0.5ML auto-injectors  has been APPROVED from 10/03/24 to 10/03/25   PA #/Case ID/Reference #: [id number]

## 2024-10-04 NOTE — Telephone Encounter (Signed)
 [facility] Rx Trulicity  0.75MG /0.5ML auto-injectors  has been APPROVED from 10/03/24 to 10/03/25

## 2024-10-05 ENCOUNTER — Inpatient Hospital Stay

## 2024-10-05 ENCOUNTER — Inpatient Hospital Stay: Admitting: Oncology

## 2024-10-05 ENCOUNTER — Telehealth: Payer: Self-pay

## 2024-10-05 NOTE — Telephone Encounter (Signed)
 Followed up with patient regarding her missed Hematology appointment with Dr. Autumn this afternoon. Left a voicemail regarding this and that a scheduler would call to reschedule her missed appointment.

## 2024-10-19 ENCOUNTER — Encounter (HOSPITAL_COMMUNITY)

## 2024-11-01 ENCOUNTER — Ambulatory Visit: Admitting: Internal Medicine
